# Patient Record
Sex: Female | Born: 1945
Health system: Southern US, Community
[De-identification: ages and names within clinical notes are randomized; demographics above are authoritative.]

## PROBLEM LIST (undated history)

## (undated) DIAGNOSIS — R7303 Prediabetes: Secondary | ICD-10-CM

## (undated) DIAGNOSIS — Z923 Personal history of irradiation: Secondary | ICD-10-CM

## (undated) DIAGNOSIS — I10 Essential (primary) hypertension: Secondary | ICD-10-CM

## (undated) DIAGNOSIS — C3491 Malignant neoplasm of unspecified part of right bronchus or lung: Secondary | ICD-10-CM

## (undated) DIAGNOSIS — M353 Polymyalgia rheumatica: Secondary | ICD-10-CM

## (undated) DIAGNOSIS — E785 Hyperlipidemia, unspecified: Secondary | ICD-10-CM

## (undated) HISTORY — DX: Essential (primary) hypertension: I10

## (undated) HISTORY — DX: Hyperlipidemia, unspecified: E78.5

## (undated) HISTORY — DX: Prediabetes: R73.03

## (undated) HISTORY — DX: Personal history of irradiation: Z92.3

## (undated) HISTORY — DX: Polymyalgia rheumatica: M35.3

## (undated) HISTORY — DX: Malignant neoplasm of unspecified part of right bronchus or lung: C34.91

---

## 1997-11-30 ENCOUNTER — Ambulatory Visit (HOSPITAL_COMMUNITY): Admission: RE | Admit: 1997-11-30 | Discharge: 1997-11-30 | Payer: Self-pay | Admitting: Gynecology

## 2005-03-10 ENCOUNTER — Ambulatory Visit: Payer: Self-pay | Admitting: Gastroenterology

## 2005-03-30 ENCOUNTER — Ambulatory Visit: Payer: Self-pay | Admitting: Gastroenterology

## 2006-12-10 ENCOUNTER — Encounter: Admission: RE | Admit: 2006-12-10 | Discharge: 2006-12-10 | Payer: Self-pay | Admitting: Obstetrics and Gynecology

## 2010-02-24 ENCOUNTER — Encounter: Payer: Self-pay | Admitting: Gastroenterology

## 2010-03-08 ENCOUNTER — Encounter: Payer: Self-pay | Admitting: Gastroenterology

## 2010-03-08 ENCOUNTER — Telehealth (INDEPENDENT_AMBULATORY_CARE_PROVIDER_SITE_OTHER): Payer: Self-pay | Admitting: *Deleted

## 2010-06-23 NOTE — Letter (Signed)
Summary: Colonoscopy Letter  [facility]  [address] [place], [place] [address]   Phone: [phone]  Fax: [phone]      February 24, 2010 MRN: [id number]   [facility] [address] [place], [place]  [address]   Dear Kristi Jennings,   According to your medical record, it is time for you to schedule a Colonoscopy. The American Cancer Society recommends this procedure as a method to detect early colon cancer. Patients with a family history of colon cancer, or a personal history of colon polyps or inflammatory bowel disease are at increased risk.  This letter has been generated based on the recommendations made at the time of your procedure. If you feel that in your particular situation this may no longer apply, please contact our office.  Please call our office at [phone] to schedule this appointment or to update your records at your earliest convenience.  Thank you for cooperating with [place] to provide you with the very best care possible.   Sincerely,  Judie Petit T. Russella Dar, M.D.  [facility] [phone]

## 2010-06-23 NOTE — Progress Notes (Signed)
  Phone Note Other Incoming   Request: Send information Summary of Call: Received a completed [facility] medical release form. The patient is requesting for copies of her records to be sent to [facility] Attn: Dr. Vashti Hey. Request forwarded to [employer].

## 2010-06-23 NOTE — Miscellaneous (Signed)
Summary: CHANGE GI MD  patient is changing to [facility]

## 2012-01-23 ENCOUNTER — Encounter: Payer: Self-pay | Admitting: Gastroenterology

## 2015-06-10 DIAGNOSIS — F419 Anxiety disorder, unspecified: Secondary | ICD-10-CM | POA: Insufficient documentation

## 2015-06-10 DIAGNOSIS — I10 Essential (primary) hypertension: Secondary | ICD-10-CM | POA: Insufficient documentation

## 2015-06-10 DIAGNOSIS — J309 Allergic rhinitis, unspecified: Secondary | ICD-10-CM | POA: Insufficient documentation

## 2016-04-21 DIAGNOSIS — L820 Inflamed seborrheic keratosis: Secondary | ICD-10-CM | POA: Diagnosis not present

## 2016-04-21 DIAGNOSIS — L570 Actinic keratosis: Secondary | ICD-10-CM | POA: Diagnosis not present

## 2016-05-03 DIAGNOSIS — I10 Essential (primary) hypertension: Secondary | ICD-10-CM | POA: Diagnosis not present

## 2016-05-03 DIAGNOSIS — J069 Acute upper respiratory infection, unspecified: Secondary | ICD-10-CM | POA: Diagnosis not present

## 2016-05-09 DIAGNOSIS — Z1231 Encounter for screening mammogram for malignant neoplasm of breast: Secondary | ICD-10-CM | POA: Diagnosis not present

## 2016-05-17 DIAGNOSIS — J00 Acute nasopharyngitis [common cold]: Secondary | ICD-10-CM | POA: Diagnosis not present

## 2016-05-17 DIAGNOSIS — J342 Deviated nasal septum: Secondary | ICD-10-CM | POA: Diagnosis not present

## 2016-05-17 DIAGNOSIS — J309 Allergic rhinitis, unspecified: Secondary | ICD-10-CM | POA: Diagnosis not present

## 2016-07-27 DIAGNOSIS — Z01419 Encounter for gynecological examination (general) (routine) without abnormal findings: Secondary | ICD-10-CM | POA: Diagnosis not present

## 2016-08-07 DIAGNOSIS — H43813 Vitreous degeneration, bilateral: Secondary | ICD-10-CM | POA: Diagnosis not present

## 2016-08-07 DIAGNOSIS — H5203 Hypermetropia, bilateral: Secondary | ICD-10-CM | POA: Diagnosis not present

## 2016-08-07 DIAGNOSIS — E119 Type 2 diabetes mellitus without complications: Secondary | ICD-10-CM | POA: Diagnosis not present

## 2016-08-07 DIAGNOSIS — H2513 Age-related nuclear cataract, bilateral: Secondary | ICD-10-CM | POA: Diagnosis not present

## 2016-09-19 DIAGNOSIS — L570 Actinic keratosis: Secondary | ICD-10-CM | POA: Diagnosis not present

## 2016-10-23 DIAGNOSIS — L570 Actinic keratosis: Secondary | ICD-10-CM | POA: Diagnosis not present

## 2016-10-23 DIAGNOSIS — L820 Inflamed seborrheic keratosis: Secondary | ICD-10-CM | POA: Diagnosis not present

## 2016-10-24 DIAGNOSIS — E119 Type 2 diabetes mellitus without complications: Secondary | ICD-10-CM | POA: Diagnosis not present

## 2016-10-24 DIAGNOSIS — E7800 Pure hypercholesterolemia, unspecified: Secondary | ICD-10-CM | POA: Diagnosis not present

## 2016-10-24 DIAGNOSIS — I10 Essential (primary) hypertension: Secondary | ICD-10-CM | POA: Diagnosis not present

## 2016-10-26 DIAGNOSIS — Z0000 Encounter for general adult medical examination without abnormal findings: Secondary | ICD-10-CM | POA: Diagnosis not present

## 2016-10-26 DIAGNOSIS — I10 Essential (primary) hypertension: Secondary | ICD-10-CM | POA: Diagnosis not present

## 2016-10-26 DIAGNOSIS — E7800 Pure hypercholesterolemia, unspecified: Secondary | ICD-10-CM | POA: Diagnosis not present

## 2016-10-26 DIAGNOSIS — E119 Type 2 diabetes mellitus without complications: Secondary | ICD-10-CM | POA: Diagnosis not present

## 2016-10-26 DIAGNOSIS — J309 Allergic rhinitis, unspecified: Secondary | ICD-10-CM | POA: Diagnosis not present

## 2016-10-26 DIAGNOSIS — M8580 Other specified disorders of bone density and structure, unspecified site: Secondary | ICD-10-CM | POA: Diagnosis not present

## 2016-10-26 DIAGNOSIS — R69 Illness, unspecified: Secondary | ICD-10-CM | POA: Diagnosis not present

## 2017-01-16 DIAGNOSIS — Z23 Encounter for immunization: Secondary | ICD-10-CM | POA: Diagnosis not present

## 2017-01-16 DIAGNOSIS — E785 Hyperlipidemia, unspecified: Secondary | ICD-10-CM | POA: Diagnosis not present

## 2017-01-16 DIAGNOSIS — R7303 Prediabetes: Secondary | ICD-10-CM | POA: Diagnosis not present

## 2017-01-16 DIAGNOSIS — I10 Essential (primary) hypertension: Secondary | ICD-10-CM | POA: Diagnosis not present

## 2017-02-06 DIAGNOSIS — H5203 Hypermetropia, bilateral: Secondary | ICD-10-CM | POA: Diagnosis not present

## 2017-02-06 DIAGNOSIS — H2513 Age-related nuclear cataract, bilateral: Secondary | ICD-10-CM | POA: Diagnosis not present

## 2017-03-21 DIAGNOSIS — B9689 Other specified bacterial agents as the cause of diseases classified elsewhere: Secondary | ICD-10-CM | POA: Diagnosis not present

## 2017-03-21 DIAGNOSIS — J019 Acute sinusitis, unspecified: Secondary | ICD-10-CM | POA: Diagnosis not present

## 2017-04-25 DIAGNOSIS — I10 Essential (primary) hypertension: Secondary | ICD-10-CM | POA: Diagnosis not present

## 2017-04-25 DIAGNOSIS — E559 Vitamin D deficiency, unspecified: Secondary | ICD-10-CM | POA: Diagnosis not present

## 2017-04-25 DIAGNOSIS — E785 Hyperlipidemia, unspecified: Secondary | ICD-10-CM | POA: Diagnosis not present

## 2017-04-25 DIAGNOSIS — E7800 Pure hypercholesterolemia, unspecified: Secondary | ICD-10-CM | POA: Diagnosis not present

## 2017-04-25 DIAGNOSIS — Z1159 Encounter for screening for other viral diseases: Secondary | ICD-10-CM | POA: Diagnosis not present

## 2017-04-25 DIAGNOSIS — R7303 Prediabetes: Secondary | ICD-10-CM | POA: Diagnosis not present

## 2017-06-04 DIAGNOSIS — L570 Actinic keratosis: Secondary | ICD-10-CM | POA: Diagnosis not present

## 2017-06-04 DIAGNOSIS — D225 Melanocytic nevi of trunk: Secondary | ICD-10-CM | POA: Diagnosis not present

## 2017-06-04 DIAGNOSIS — D1801 Hemangioma of skin and subcutaneous tissue: Secondary | ICD-10-CM | POA: Diagnosis not present

## 2017-06-04 DIAGNOSIS — D2261 Melanocytic nevi of right upper limb, including shoulder: Secondary | ICD-10-CM | POA: Diagnosis not present

## 2017-06-04 DIAGNOSIS — L821 Other seborrheic keratosis: Secondary | ICD-10-CM | POA: Diagnosis not present

## 2017-06-04 DIAGNOSIS — D2262 Melanocytic nevi of left upper limb, including shoulder: Secondary | ICD-10-CM | POA: Diagnosis not present

## 2017-06-05 DIAGNOSIS — Z1231 Encounter for screening mammogram for malignant neoplasm of breast: Secondary | ICD-10-CM | POA: Diagnosis not present

## 2017-06-05 DIAGNOSIS — M8589 Other specified disorders of bone density and structure, multiple sites: Secondary | ICD-10-CM | POA: Diagnosis not present

## 2017-06-05 DIAGNOSIS — Z780 Asymptomatic menopausal state: Secondary | ICD-10-CM | POA: Diagnosis not present

## 2017-06-05 DIAGNOSIS — M810 Age-related osteoporosis without current pathological fracture: Secondary | ICD-10-CM | POA: Diagnosis not present

## 2017-07-31 DIAGNOSIS — Z01419 Encounter for gynecological examination (general) (routine) without abnormal findings: Secondary | ICD-10-CM | POA: Diagnosis not present

## 2017-08-06 DIAGNOSIS — H5203 Hypermetropia, bilateral: Secondary | ICD-10-CM | POA: Diagnosis not present

## 2017-08-06 DIAGNOSIS — R7303 Prediabetes: Secondary | ICD-10-CM | POA: Diagnosis not present

## 2017-08-06 DIAGNOSIS — H02831 Dermatochalasis of right upper eyelid: Secondary | ICD-10-CM | POA: Diagnosis not present

## 2017-08-06 DIAGNOSIS — H43813 Vitreous degeneration, bilateral: Secondary | ICD-10-CM | POA: Diagnosis not present

## 2017-08-06 DIAGNOSIS — H02834 Dermatochalasis of left upper eyelid: Secondary | ICD-10-CM | POA: Diagnosis not present

## 2017-08-06 DIAGNOSIS — H2513 Age-related nuclear cataract, bilateral: Secondary | ICD-10-CM | POA: Diagnosis not present

## 2017-09-28 DIAGNOSIS — J301 Allergic rhinitis due to pollen: Secondary | ICD-10-CM | POA: Diagnosis not present

## 2017-09-28 DIAGNOSIS — J069 Acute upper respiratory infection, unspecified: Secondary | ICD-10-CM | POA: Diagnosis not present

## 2017-10-23 DIAGNOSIS — E785 Hyperlipidemia, unspecified: Secondary | ICD-10-CM | POA: Diagnosis not present

## 2017-10-23 DIAGNOSIS — I10 Essential (primary) hypertension: Secondary | ICD-10-CM | POA: Diagnosis not present

## 2017-10-25 DIAGNOSIS — E876 Hypokalemia: Secondary | ICD-10-CM | POA: Diagnosis not present

## 2017-10-29 DIAGNOSIS — E559 Vitamin D deficiency, unspecified: Secondary | ICD-10-CM | POA: Diagnosis not present

## 2017-10-29 DIAGNOSIS — J0100 Acute maxillary sinusitis, unspecified: Secondary | ICD-10-CM | POA: Diagnosis not present

## 2017-10-29 DIAGNOSIS — J209 Acute bronchitis, unspecified: Secondary | ICD-10-CM | POA: Diagnosis not present

## 2017-10-29 DIAGNOSIS — Z0000 Encounter for general adult medical examination without abnormal findings: Secondary | ICD-10-CM | POA: Diagnosis not present

## 2017-10-29 DIAGNOSIS — I10 Essential (primary) hypertension: Secondary | ICD-10-CM | POA: Diagnosis not present

## 2017-10-29 DIAGNOSIS — D72819 Decreased white blood cell count, unspecified: Secondary | ICD-10-CM | POA: Diagnosis not present

## 2017-10-29 DIAGNOSIS — R7303 Prediabetes: Secondary | ICD-10-CM | POA: Diagnosis not present

## 2017-10-29 DIAGNOSIS — E785 Hyperlipidemia, unspecified: Secondary | ICD-10-CM | POA: Diagnosis not present

## 2017-12-12 DIAGNOSIS — L570 Actinic keratosis: Secondary | ICD-10-CM | POA: Diagnosis not present

## 2017-12-12 DIAGNOSIS — D1801 Hemangioma of skin and subcutaneous tissue: Secondary | ICD-10-CM | POA: Diagnosis not present

## 2017-12-12 DIAGNOSIS — L814 Other melanin hyperpigmentation: Secondary | ICD-10-CM | POA: Diagnosis not present

## 2018-02-05 DIAGNOSIS — D72819 Decreased white blood cell count, unspecified: Secondary | ICD-10-CM | POA: Diagnosis not present

## 2018-02-05 DIAGNOSIS — Z23 Encounter for immunization: Secondary | ICD-10-CM | POA: Diagnosis not present

## 2018-02-07 DIAGNOSIS — H2513 Age-related nuclear cataract, bilateral: Secondary | ICD-10-CM | POA: Diagnosis not present

## 2018-06-19 DIAGNOSIS — D225 Melanocytic nevi of trunk: Secondary | ICD-10-CM | POA: Diagnosis not present

## 2018-06-19 DIAGNOSIS — L570 Actinic keratosis: Secondary | ICD-10-CM | POA: Diagnosis not present

## 2018-06-19 DIAGNOSIS — R7303 Prediabetes: Secondary | ICD-10-CM | POA: Diagnosis not present

## 2018-06-19 DIAGNOSIS — I10 Essential (primary) hypertension: Secondary | ICD-10-CM | POA: Diagnosis not present

## 2018-06-19 DIAGNOSIS — J0100 Acute maxillary sinusitis, unspecified: Secondary | ICD-10-CM | POA: Diagnosis not present

## 2018-06-19 DIAGNOSIS — J209 Acute bronchitis, unspecified: Secondary | ICD-10-CM | POA: Diagnosis not present

## 2018-06-19 DIAGNOSIS — D72819 Decreased white blood cell count, unspecified: Secondary | ICD-10-CM | POA: Diagnosis not present

## 2018-07-16 DIAGNOSIS — M791 Myalgia, unspecified site: Secondary | ICD-10-CM | POA: Diagnosis not present

## 2018-07-16 DIAGNOSIS — I10 Essential (primary) hypertension: Secondary | ICD-10-CM | POA: Diagnosis not present

## 2018-08-05 DIAGNOSIS — Z01419 Encounter for gynecological examination (general) (routine) without abnormal findings: Secondary | ICD-10-CM | POA: Diagnosis not present

## 2018-08-06 DIAGNOSIS — Z1231 Encounter for screening mammogram for malignant neoplasm of breast: Secondary | ICD-10-CM | POA: Diagnosis not present

## 2018-08-20 DIAGNOSIS — M2550 Pain in unspecified joint: Secondary | ICD-10-CM | POA: Diagnosis not present

## 2018-08-20 DIAGNOSIS — Z6820 Body mass index (BMI) 20.0-20.9, adult: Secondary | ICD-10-CM | POA: Diagnosis not present

## 2018-08-20 DIAGNOSIS — M150 Primary generalized (osteo)arthritis: Secondary | ICD-10-CM | POA: Diagnosis not present

## 2018-08-20 DIAGNOSIS — M353 Polymyalgia rheumatica: Secondary | ICD-10-CM | POA: Diagnosis not present

## 2018-08-20 DIAGNOSIS — R5383 Other fatigue: Secondary | ICD-10-CM | POA: Diagnosis not present

## 2018-08-20 DIAGNOSIS — R7982 Elevated C-reactive protein (CRP): Secondary | ICD-10-CM | POA: Diagnosis not present

## 2018-09-19 DIAGNOSIS — M353 Polymyalgia rheumatica: Secondary | ICD-10-CM | POA: Diagnosis not present

## 2018-09-19 DIAGNOSIS — M2550 Pain in unspecified joint: Secondary | ICD-10-CM | POA: Diagnosis not present

## 2018-09-19 DIAGNOSIS — R768 Other specified abnormal immunological findings in serum: Secondary | ICD-10-CM | POA: Diagnosis not present

## 2018-09-19 DIAGNOSIS — M154 Erosive (osteo)arthritis: Secondary | ICD-10-CM | POA: Diagnosis not present

## 2018-09-19 DIAGNOSIS — M150 Primary generalized (osteo)arthritis: Secondary | ICD-10-CM | POA: Diagnosis not present

## 2018-10-15 DIAGNOSIS — M79642 Pain in left hand: Secondary | ICD-10-CM | POA: Diagnosis not present

## 2018-10-15 DIAGNOSIS — S62321A Displaced fracture of shaft of second metacarpal bone, left hand, initial encounter for closed fracture: Secondary | ICD-10-CM | POA: Diagnosis not present

## 2018-10-29 DIAGNOSIS — R7303 Prediabetes: Secondary | ICD-10-CM | POA: Diagnosis not present

## 2018-10-29 DIAGNOSIS — I10 Essential (primary) hypertension: Secondary | ICD-10-CM | POA: Diagnosis not present

## 2018-10-31 DIAGNOSIS — D72819 Decreased white blood cell count, unspecified: Secondary | ICD-10-CM | POA: Diagnosis not present

## 2018-10-31 DIAGNOSIS — Z0000 Encounter for general adult medical examination without abnormal findings: Secondary | ICD-10-CM | POA: Diagnosis not present

## 2018-10-31 DIAGNOSIS — R3129 Other microscopic hematuria: Secondary | ICD-10-CM | POA: Diagnosis not present

## 2018-10-31 DIAGNOSIS — E876 Hypokalemia: Secondary | ICD-10-CM | POA: Diagnosis not present

## 2018-10-31 DIAGNOSIS — I493 Ventricular premature depolarization: Secondary | ICD-10-CM | POA: Diagnosis not present

## 2018-10-31 DIAGNOSIS — Z87891 Personal history of nicotine dependence: Secondary | ICD-10-CM | POA: Diagnosis not present

## 2018-10-31 DIAGNOSIS — R7303 Prediabetes: Secondary | ICD-10-CM | POA: Diagnosis not present

## 2018-10-31 DIAGNOSIS — E785 Hyperlipidemia, unspecified: Secondary | ICD-10-CM | POA: Diagnosis not present

## 2018-10-31 DIAGNOSIS — I10 Essential (primary) hypertension: Secondary | ICD-10-CM | POA: Diagnosis not present

## 2018-11-04 DIAGNOSIS — I10 Essential (primary) hypertension: Secondary | ICD-10-CM | POA: Diagnosis not present

## 2018-11-08 DIAGNOSIS — E876 Hypokalemia: Secondary | ICD-10-CM | POA: Diagnosis not present

## 2018-11-12 DIAGNOSIS — S62321D Displaced fracture of shaft of second metacarpal bone, left hand, subsequent encounter for fracture with routine healing: Secondary | ICD-10-CM | POA: Diagnosis not present

## 2018-11-12 DIAGNOSIS — M79642 Pain in left hand: Secondary | ICD-10-CM | POA: Diagnosis not present

## 2018-11-13 DIAGNOSIS — R3129 Other microscopic hematuria: Secondary | ICD-10-CM | POA: Diagnosis not present

## 2018-11-18 DIAGNOSIS — H2513 Age-related nuclear cataract, bilateral: Secondary | ICD-10-CM | POA: Diagnosis not present

## 2018-11-18 DIAGNOSIS — H524 Presbyopia: Secondary | ICD-10-CM | POA: Diagnosis not present

## 2018-11-18 DIAGNOSIS — H02834 Dermatochalasis of left upper eyelid: Secondary | ICD-10-CM | POA: Diagnosis not present

## 2018-11-18 DIAGNOSIS — H43813 Vitreous degeneration, bilateral: Secondary | ICD-10-CM | POA: Diagnosis not present

## 2018-11-18 DIAGNOSIS — H02831 Dermatochalasis of right upper eyelid: Secondary | ICD-10-CM | POA: Diagnosis not present

## 2018-11-18 DIAGNOSIS — H5203 Hypermetropia, bilateral: Secondary | ICD-10-CM | POA: Diagnosis not present

## 2018-11-18 DIAGNOSIS — E876 Hypokalemia: Secondary | ICD-10-CM | POA: Diagnosis not present

## 2018-11-18 DIAGNOSIS — H52203 Unspecified astigmatism, bilateral: Secondary | ICD-10-CM | POA: Diagnosis not present

## 2018-11-18 DIAGNOSIS — R7303 Prediabetes: Secondary | ICD-10-CM | POA: Diagnosis not present

## 2018-11-26 DIAGNOSIS — M154 Erosive (osteo)arthritis: Secondary | ICD-10-CM | POA: Diagnosis not present

## 2018-11-26 DIAGNOSIS — M150 Primary generalized (osteo)arthritis: Secondary | ICD-10-CM | POA: Diagnosis not present

## 2018-11-26 DIAGNOSIS — M2550 Pain in unspecified joint: Secondary | ICD-10-CM | POA: Diagnosis not present

## 2018-11-26 DIAGNOSIS — R768 Other specified abnormal immunological findings in serum: Secondary | ICD-10-CM | POA: Diagnosis not present

## 2018-11-26 DIAGNOSIS — M353 Polymyalgia rheumatica: Secondary | ICD-10-CM | POA: Diagnosis not present

## 2018-12-12 DIAGNOSIS — S62321D Displaced fracture of shaft of second metacarpal bone, left hand, subsequent encounter for fracture with routine healing: Secondary | ICD-10-CM | POA: Diagnosis not present

## 2018-12-12 DIAGNOSIS — M79642 Pain in left hand: Secondary | ICD-10-CM | POA: Diagnosis not present

## 2018-12-18 DIAGNOSIS — B351 Tinea unguium: Secondary | ICD-10-CM | POA: Diagnosis not present

## 2018-12-18 DIAGNOSIS — L821 Other seborrheic keratosis: Secondary | ICD-10-CM | POA: Diagnosis not present

## 2018-12-18 DIAGNOSIS — L570 Actinic keratosis: Secondary | ICD-10-CM | POA: Diagnosis not present

## 2018-12-26 DIAGNOSIS — J342 Deviated nasal septum: Secondary | ICD-10-CM | POA: Diagnosis not present

## 2018-12-26 DIAGNOSIS — J328 Other chronic sinusitis: Secondary | ICD-10-CM | POA: Diagnosis not present

## 2018-12-26 DIAGNOSIS — J309 Allergic rhinitis, unspecified: Secondary | ICD-10-CM | POA: Diagnosis not present

## 2018-12-26 DIAGNOSIS — H608X3 Other otitis externa, bilateral: Secondary | ICD-10-CM | POA: Diagnosis not present

## 2019-01-29 DIAGNOSIS — R69 Illness, unspecified: Secondary | ICD-10-CM | POA: Diagnosis not present

## 2019-01-29 DIAGNOSIS — M150 Primary generalized (osteo)arthritis: Secondary | ICD-10-CM | POA: Diagnosis not present

## 2019-01-29 DIAGNOSIS — Z79899 Other long term (current) drug therapy: Secondary | ICD-10-CM | POA: Diagnosis not present

## 2019-01-29 DIAGNOSIS — M154 Erosive (osteo)arthritis: Secondary | ICD-10-CM | POA: Diagnosis not present

## 2019-01-29 DIAGNOSIS — R5383 Other fatigue: Secondary | ICD-10-CM | POA: Diagnosis not present

## 2019-01-29 DIAGNOSIS — M2550 Pain in unspecified joint: Secondary | ICD-10-CM | POA: Diagnosis not present

## 2019-01-29 DIAGNOSIS — M353 Polymyalgia rheumatica: Secondary | ICD-10-CM | POA: Diagnosis not present

## 2019-01-29 DIAGNOSIS — R768 Other specified abnormal immunological findings in serum: Secondary | ICD-10-CM | POA: Diagnosis not present

## 2019-02-12 DIAGNOSIS — M79642 Pain in left hand: Secondary | ICD-10-CM | POA: Diagnosis not present

## 2019-02-12 DIAGNOSIS — S62321D Displaced fracture of shaft of second metacarpal bone, left hand, subsequent encounter for fracture with routine healing: Secondary | ICD-10-CM | POA: Diagnosis not present

## 2019-03-31 DIAGNOSIS — B078 Other viral warts: Secondary | ICD-10-CM | POA: Diagnosis not present

## 2019-04-02 DIAGNOSIS — M150 Primary generalized (osteo)arthritis: Secondary | ICD-10-CM | POA: Diagnosis not present

## 2019-04-02 DIAGNOSIS — M154 Erosive (osteo)arthritis: Secondary | ICD-10-CM | POA: Diagnosis not present

## 2019-04-02 DIAGNOSIS — M2550 Pain in unspecified joint: Secondary | ICD-10-CM | POA: Diagnosis not present

## 2019-04-02 DIAGNOSIS — L659 Nonscarring hair loss, unspecified: Secondary | ICD-10-CM | POA: Diagnosis not present

## 2019-04-02 DIAGNOSIS — R768 Other specified abnormal immunological findings in serum: Secondary | ICD-10-CM | POA: Diagnosis not present

## 2019-04-02 DIAGNOSIS — M353 Polymyalgia rheumatica: Secondary | ICD-10-CM | POA: Diagnosis not present

## 2019-04-02 DIAGNOSIS — Z79899 Other long term (current) drug therapy: Secondary | ICD-10-CM | POA: Diagnosis not present

## 2019-05-26 DIAGNOSIS — H5203 Hypermetropia, bilateral: Secondary | ICD-10-CM | POA: Diagnosis not present

## 2019-05-26 DIAGNOSIS — H524 Presbyopia: Secondary | ICD-10-CM | POA: Diagnosis not present

## 2019-05-26 DIAGNOSIS — H52203 Unspecified astigmatism, bilateral: Secondary | ICD-10-CM | POA: Diagnosis not present

## 2019-05-26 DIAGNOSIS — H2513 Age-related nuclear cataract, bilateral: Secondary | ICD-10-CM | POA: Diagnosis not present

## 2019-06-25 DIAGNOSIS — L570 Actinic keratosis: Secondary | ICD-10-CM | POA: Diagnosis not present

## 2019-06-25 DIAGNOSIS — L821 Other seborrheic keratosis: Secondary | ICD-10-CM | POA: Diagnosis not present

## 2019-06-25 DIAGNOSIS — L814 Other melanin hyperpigmentation: Secondary | ICD-10-CM | POA: Diagnosis not present

## 2019-06-25 DIAGNOSIS — L578 Other skin changes due to chronic exposure to nonionizing radiation: Secondary | ICD-10-CM | POA: Diagnosis not present

## 2019-07-08 DIAGNOSIS — Z6821 Body mass index (BMI) 21.0-21.9, adult: Secondary | ICD-10-CM | POA: Diagnosis not present

## 2019-07-08 DIAGNOSIS — R768 Other specified abnormal immunological findings in serum: Secondary | ICD-10-CM | POA: Diagnosis not present

## 2019-07-08 DIAGNOSIS — M353 Polymyalgia rheumatica: Secondary | ICD-10-CM | POA: Diagnosis not present

## 2019-07-08 DIAGNOSIS — M150 Primary generalized (osteo)arthritis: Secondary | ICD-10-CM | POA: Diagnosis not present

## 2019-07-08 DIAGNOSIS — Z79899 Other long term (current) drug therapy: Secondary | ICD-10-CM | POA: Diagnosis not present

## 2019-07-08 DIAGNOSIS — M2550 Pain in unspecified joint: Secondary | ICD-10-CM | POA: Diagnosis not present

## 2019-07-08 DIAGNOSIS — M154 Erosive (osteo)arthritis: Secondary | ICD-10-CM | POA: Diagnosis not present

## 2019-08-12 DIAGNOSIS — M353 Polymyalgia rheumatica: Secondary | ICD-10-CM | POA: Insufficient documentation

## 2019-08-12 DIAGNOSIS — Z01419 Encounter for gynecological examination (general) (routine) without abnormal findings: Secondary | ICD-10-CM | POA: Diagnosis not present

## 2019-08-28 DIAGNOSIS — M8589 Other specified disorders of bone density and structure, multiple sites: Secondary | ICD-10-CM | POA: Diagnosis not present

## 2019-08-28 DIAGNOSIS — Z1231 Encounter for screening mammogram for malignant neoplasm of breast: Secondary | ICD-10-CM | POA: Diagnosis not present

## 2019-08-28 DIAGNOSIS — Z780 Asymptomatic menopausal state: Secondary | ICD-10-CM | POA: Diagnosis not present

## 2019-08-28 DIAGNOSIS — M810 Age-related osteoporosis without current pathological fracture: Secondary | ICD-10-CM | POA: Diagnosis not present

## 2019-09-24 DIAGNOSIS — R7303 Prediabetes: Secondary | ICD-10-CM | POA: Diagnosis not present

## 2019-09-24 DIAGNOSIS — I10 Essential (primary) hypertension: Secondary | ICD-10-CM | POA: Diagnosis not present

## 2019-09-24 DIAGNOSIS — E785 Hyperlipidemia, unspecified: Secondary | ICD-10-CM | POA: Diagnosis not present

## 2019-09-25 DIAGNOSIS — D72819 Decreased white blood cell count, unspecified: Secondary | ICD-10-CM | POA: Diagnosis not present

## 2019-09-25 DIAGNOSIS — R7303 Prediabetes: Secondary | ICD-10-CM | POA: Diagnosis not present

## 2019-09-25 DIAGNOSIS — E876 Hypokalemia: Secondary | ICD-10-CM | POA: Diagnosis not present

## 2019-09-25 DIAGNOSIS — E785 Hyperlipidemia, unspecified: Secondary | ICD-10-CM | POA: Diagnosis not present

## 2019-09-25 DIAGNOSIS — I10 Essential (primary) hypertension: Secondary | ICD-10-CM | POA: Diagnosis not present

## 2019-09-25 DIAGNOSIS — M353 Polymyalgia rheumatica: Secondary | ICD-10-CM | POA: Diagnosis not present

## 2019-10-06 DIAGNOSIS — M2550 Pain in unspecified joint: Secondary | ICD-10-CM | POA: Diagnosis not present

## 2019-10-06 DIAGNOSIS — M154 Erosive (osteo)arthritis: Secondary | ICD-10-CM | POA: Diagnosis not present

## 2019-10-06 DIAGNOSIS — I10 Essential (primary) hypertension: Secondary | ICD-10-CM | POA: Diagnosis not present

## 2019-10-06 DIAGNOSIS — R768 Other specified abnormal immunological findings in serum: Secondary | ICD-10-CM | POA: Diagnosis not present

## 2019-10-06 DIAGNOSIS — M150 Primary generalized (osteo)arthritis: Secondary | ICD-10-CM | POA: Diagnosis not present

## 2019-10-06 DIAGNOSIS — M353 Polymyalgia rheumatica: Secondary | ICD-10-CM | POA: Diagnosis not present

## 2019-10-06 DIAGNOSIS — Z6820 Body mass index (BMI) 20.0-20.9, adult: Secondary | ICD-10-CM | POA: Diagnosis not present

## 2019-10-10 DIAGNOSIS — I10 Essential (primary) hypertension: Secondary | ICD-10-CM | POA: Diagnosis not present

## 2019-12-04 DIAGNOSIS — H52203 Unspecified astigmatism, bilateral: Secondary | ICD-10-CM | POA: Diagnosis not present

## 2019-12-04 DIAGNOSIS — H524 Presbyopia: Secondary | ICD-10-CM | POA: Diagnosis not present

## 2019-12-04 DIAGNOSIS — H2513 Age-related nuclear cataract, bilateral: Secondary | ICD-10-CM | POA: Diagnosis not present

## 2019-12-04 DIAGNOSIS — H02831 Dermatochalasis of right upper eyelid: Secondary | ICD-10-CM | POA: Diagnosis not present

## 2019-12-04 DIAGNOSIS — R7303 Prediabetes: Secondary | ICD-10-CM | POA: Diagnosis not present

## 2019-12-04 DIAGNOSIS — H02834 Dermatochalasis of left upper eyelid: Secondary | ICD-10-CM | POA: Diagnosis not present

## 2019-12-04 DIAGNOSIS — H43813 Vitreous degeneration, bilateral: Secondary | ICD-10-CM | POA: Diagnosis not present

## 2019-12-04 DIAGNOSIS — H5203 Hypermetropia, bilateral: Secondary | ICD-10-CM | POA: Diagnosis not present

## 2019-12-31 DIAGNOSIS — L578 Other skin changes due to chronic exposure to nonionizing radiation: Secondary | ICD-10-CM | POA: Diagnosis not present

## 2019-12-31 DIAGNOSIS — L814 Other melanin hyperpigmentation: Secondary | ICD-10-CM | POA: Diagnosis not present

## 2019-12-31 DIAGNOSIS — L821 Other seborrheic keratosis: Secondary | ICD-10-CM | POA: Diagnosis not present

## 2019-12-31 DIAGNOSIS — L570 Actinic keratosis: Secondary | ICD-10-CM | POA: Diagnosis not present

## 2020-01-13 DIAGNOSIS — R768 Other specified abnormal immunological findings in serum: Secondary | ICD-10-CM | POA: Diagnosis not present

## 2020-01-13 DIAGNOSIS — M2550 Pain in unspecified joint: Secondary | ICD-10-CM | POA: Diagnosis not present

## 2020-01-13 DIAGNOSIS — M353 Polymyalgia rheumatica: Secondary | ICD-10-CM | POA: Diagnosis not present

## 2020-01-13 DIAGNOSIS — M150 Primary generalized (osteo)arthritis: Secondary | ICD-10-CM | POA: Diagnosis not present

## 2020-01-13 DIAGNOSIS — Z681 Body mass index (BMI) 19 or less, adult: Secondary | ICD-10-CM | POA: Diagnosis not present

## 2020-01-13 DIAGNOSIS — M154 Erosive (osteo)arthritis: Secondary | ICD-10-CM | POA: Diagnosis not present

## 2020-03-16 DIAGNOSIS — H0012 Chalazion right lower eyelid: Secondary | ICD-10-CM | POA: Diagnosis not present

## 2020-03-16 DIAGNOSIS — H0100A Unspecified blepharitis right eye, upper and lower eyelids: Secondary | ICD-10-CM | POA: Diagnosis not present

## 2020-03-16 DIAGNOSIS — H0100B Unspecified blepharitis left eye, upper and lower eyelids: Secondary | ICD-10-CM | POA: Diagnosis not present

## 2020-04-29 ENCOUNTER — Emergency Department
Admission: RE | Admit: 2020-04-29 | Discharge: 2020-04-29 | Disposition: A | Discharge status: Home / Self Care | Payer: Medicare HMO | Source: Ambulatory Visit

## 2020-04-29 ENCOUNTER — Other Ambulatory Visit: Payer: Self-pay

## 2020-04-29 VITALS — BP 134/78 | HR 80 | Temp 98.3°F | Resp 17 | Ht 64.0 in | Wt 119.0 lb

## 2020-04-29 DIAGNOSIS — R059 Cough, unspecified: Secondary | ICD-10-CM

## 2020-04-29 DIAGNOSIS — R0981 Nasal congestion: Secondary | ICD-10-CM

## 2020-04-29 DIAGNOSIS — J3489 Other specified disorders of nose and nasal sinuses: Secondary | ICD-10-CM

## 2020-04-29 MED ORDER — BENZONATATE 100 MG PO CAPS: 100.0000 mg | ORAL_CAPSULE | Freq: Three times a day (TID) | ORAL | 0 refills | Status: DC

## 2020-04-29 MED ORDER — IPRATROPIUM BROMIDE 0.06 % NA SOLN: 2.0000 | Freq: Four times a day (QID) | NASAL | 1 refills | Status: DC

## 2020-04-29 NOTE — ED Triage Notes (Addendum)
Nasal congestion w/ cough & sinus drainage x 5 days  OTC Corcidin, tylenol Just finished a course of doxycycline from opthamologist Denies fever COVID booster this past month

## 2020-04-29 NOTE — Discharge Instructions (Addendum)
°  You may use the medications as prescribed to help with cough and sinus drainage.  Follow up with family medicine later next week if not improving, especially if symptoms worsening- pain, fever, vomiting.

## 2020-04-29 NOTE — ED Provider Notes (Signed)
[facility]    CSN: [id number] Arrival date & time: 04/29/20  0945      History   Chief Complaint Chief Complaint  Patient presents with  . Sore Throat    HPI Kristi Jennings is a 74 y.o. female.   HPI Kristi Jennings is a 74 y.o. female presenting to UC with c/o nasal congestion, cough, and sinus drainage that is clear for about 5 days.  She has taken OTC Coricidin and tylenol with mild relief.  She just finished a 30 day course of doxycycline for a chalazion tx by her ophthalmologist.  Denies fever, chills, n/v/d. Her husband was seen last week for a URI, both have been fully vaccinated including booster COVID vaccine in October. Denies n/v/d.      History reviewed. No pertinent past medical history.  Patient Active Problem List   Diagnosis Date Noted  . Polymyalgia rheumatica ([place]) 08/12/2019  . Allergic rhinitis 06/10/2015  . Anxiety 06/10/2015  . Benign essential hypertension 06/10/2015    History reviewed. No pertinent surgical history.  OB History   No obstetric history on file.      Home Medications    Prior to Admission medications   Medication Sig Start Date End Date Taking? Authorizing Provider  losartan (COZAAR) 50 MG tablet Take 1 tablet by mouth daily. 01/16/14  Yes [provider]  predniSONE (DELTASONE) 1 MG tablet  11/26/18  Yes [provider]  triamterene-hydrochlorothiazide (DYAZIDE) 37.5-25 MG capsule Take 1 tablet by mouth daily. 01/16/14  Yes [provider]  aspirin EC 81 MG tablet Take 81 mg by mouth 4 (four) times a week. Swallow whole.    [provider]  benzonatate (TESSALON) 100 MG capsule Take 1-2 capsules (100-200 mg total) by mouth every 8 (eight) hours. 04/29/20   Noe Gens, PA-C  ipratropium (ATROVENT) 0.06 % nasal spray Place 2 sprays into both nostrils 4 (four) times daily. 04/29/20   Noe Gens, PA-C  rosuvastatin (CRESTOR) 10 MG tablet Take 10 mg by mouth at bedtime. 02/23/20    [provider]    Family History Family History  Problem Relation Age of Onset  . Heart attack Mother   . Colon cancer Father   . ALS Brother     Social History Social History   Tobacco Use  . Smoking status: Former Research scientist (life sciences)  . Smokeless tobacco: Never Used  . Tobacco comment: quit 2010  Vaping Use  . Vaping Use: Never used  Substance Use Topics  . Alcohol use: Not Currently  . Drug use: Never     Allergies   Other   Review of Systems Review of Systems  Constitutional: Negative for chills and fever.  HENT: Positive for congestion and rhinorrhea. Negative for ear pain, sore throat, trouble swallowing and voice change.   Respiratory: Positive for cough. Negative for shortness of breath.   Cardiovascular: Negative for chest pain and palpitations.  Gastrointestinal: Negative for abdominal pain, diarrhea, nausea and vomiting.  Musculoskeletal: Negative for arthralgias, back pain and myalgias.  Skin: Negative for rash.  Neurological: Positive for headaches (mild). Negative for dizziness and light-headedness.  All other systems reviewed and are negative.    Physical Exam Triage Vital Signs ED Triage Vitals  Enc Vitals Group     BP 04/29/20 1002 134/78     Pulse Rate 04/29/20 1002 80     Resp 04/29/20 1002 17     Temp 04/29/20 1002 98.3 F (36.8 C)  Temp Source 04/29/20 1002 Oral     SpO2 04/29/20 1002 96 %     Weight 04/29/20 1006 119 lb (54 kg)     Height 04/29/20 1006 5\' 4"  (1.626 m)     Head Circumference --      Peak Flow --      Pain Score 04/29/20 1005 0     Pain Loc --      Pain Edu? --      Excl. in [place]? --    No data found.  Updated Vital Signs BP 134/78 (BP Location: Right Arm)   Pulse 80   Temp 98.3 F (36.8 C) (Oral)   Resp 17   Ht 5\' 4"  (1.626 m)   Wt 119 lb (54 kg)   SpO2 96%   BMI 20.43 kg/m   Visual Acuity Right Eye Distance:   Left Eye Distance:   Bilateral Distance:    Right Eye Near:   Left Eye Near:     Bilateral Near:     Physical Exam Vitals and nursing note reviewed.  Constitutional:      General: She is not in acute distress.    Appearance: She is well-developed and well-nourished. She is not ill-appearing, toxic-appearing or diaphoretic.  HENT:     Head: Normocephalic and atraumatic.     Right Ear: Tympanic membrane and ear canal normal.     Left Ear: Tympanic membrane and ear canal normal.     Nose: Nose normal.     Right Sinus: No maxillary sinus tenderness or frontal sinus tenderness.     Left Sinus: No maxillary sinus tenderness or frontal sinus tenderness.     Mouth/Throat:     Lips: Pink.     Mouth: Mucous membranes are moist.     Pharynx: Oropharynx is clear. Uvula midline. No pharyngeal swelling, oropharyngeal exudate, posterior oropharyngeal erythema or uvula swelling.  Eyes:     Extraocular Movements: EOM normal.  Cardiovascular:     Rate and Rhythm: Normal rate and regular rhythm.  Pulmonary:     Effort: Pulmonary effort is normal. No respiratory distress.     Breath sounds: Normal breath sounds. No stridor. No wheezing, rhonchi or rales.  Musculoskeletal:        General: Normal range of motion.     Cervical back: Normal range of motion and neck supple.  Lymphadenopathy:     Cervical: No cervical adenopathy.  Skin:    General: Skin is warm and dry.  Neurological:     Mental Status: She is alert and oriented to person, place, and time.  Psychiatric:        Mood and Affect: Mood and affect normal.        Behavior: Behavior normal.      UC Treatments / Results  Labs (all labs ordered are listed, but only abnormal results are displayed) Labs Reviewed - No data to display  EKG   Radiology No results found.  Procedures Procedures (including critical care time)  Medications Ordered in UC Medications - No data to display  Initial Impression / Assessment and Plan / UC Course  I have reviewed the triage vital signs and the nursing  notes.  Pertinent labs & imaging results that were available during my care of the patient were reviewed by me and considered in my medical decision making (see chart for details).     No evidence of bacterial infection at this time Encouraged symptomatic tx F/u with PCP next week if needed  Final Clinical Impressions(s) / UC Diagnoses   Final diagnoses:  Nasal congestion  Rhinorrhea  Cough     Discharge Instructions      You may use the medications as prescribed to help with cough and sinus drainage.  Follow up with family medicine later next week if not improving, especially if symptoms worsening- pain, fever, vomiting.     ED Prescriptions    Medication Sig Dispense Auth. Provider   ipratropium (ATROVENT) 0.06 % nasal spray Place 2 sprays into both nostrils 4 (four) times daily. 15 mL Haden Cavenaugh O, PA-C   benzonatate (TESSALON) 100 MG capsule Take 1-2 capsules (100-200 mg total) by mouth every 8 (eight) hours. 21 capsule Noe Gens, [place]     PDMP not reviewed this encounter.   Noe Gens, PA-C 04/29/20 1139

## 2020-05-13 DIAGNOSIS — J019 Acute sinusitis, unspecified: Secondary | ICD-10-CM | POA: Diagnosis not present

## 2020-05-13 DIAGNOSIS — R7303 Prediabetes: Secondary | ICD-10-CM | POA: Diagnosis not present

## 2020-05-13 DIAGNOSIS — J209 Acute bronchitis, unspecified: Secondary | ICD-10-CM | POA: Diagnosis not present

## 2020-05-18 DIAGNOSIS — R768 Other specified abnormal immunological findings in serum: Secondary | ICD-10-CM | POA: Diagnosis not present

## 2020-05-18 DIAGNOSIS — Z681 Body mass index (BMI) 19 or less, adult: Secondary | ICD-10-CM | POA: Diagnosis not present

## 2020-05-18 DIAGNOSIS — M150 Primary generalized (osteo)arthritis: Secondary | ICD-10-CM | POA: Diagnosis not present

## 2020-05-18 DIAGNOSIS — M353 Polymyalgia rheumatica: Secondary | ICD-10-CM | POA: Diagnosis not present

## 2020-05-18 DIAGNOSIS — M2550 Pain in unspecified joint: Secondary | ICD-10-CM | POA: Diagnosis not present

## 2020-05-18 DIAGNOSIS — M154 Erosive (osteo)arthritis: Secondary | ICD-10-CM | POA: Diagnosis not present

## 2020-05-25 DIAGNOSIS — I10 Essential (primary) hypertension: Secondary | ICD-10-CM | POA: Diagnosis not present

## 2020-05-27 DIAGNOSIS — M353 Polymyalgia rheumatica: Secondary | ICD-10-CM | POA: Diagnosis not present

## 2020-05-27 DIAGNOSIS — I10 Essential (primary) hypertension: Secondary | ICD-10-CM | POA: Diagnosis not present

## 2020-06-15 DIAGNOSIS — H524 Presbyopia: Secondary | ICD-10-CM | POA: Diagnosis not present

## 2020-06-15 DIAGNOSIS — H52203 Unspecified astigmatism, bilateral: Secondary | ICD-10-CM | POA: Diagnosis not present

## 2020-06-15 DIAGNOSIS — H5203 Hypermetropia, bilateral: Secondary | ICD-10-CM | POA: Diagnosis not present

## 2020-06-15 DIAGNOSIS — H2513 Age-related nuclear cataract, bilateral: Secondary | ICD-10-CM | POA: Diagnosis not present

## 2020-07-01 DIAGNOSIS — Z1211 Encounter for screening for malignant neoplasm of colon: Secondary | ICD-10-CM | POA: Diagnosis not present

## 2020-07-01 DIAGNOSIS — K639 Disease of intestine, unspecified: Secondary | ICD-10-CM | POA: Diagnosis not present

## 2020-07-01 DIAGNOSIS — K6389 Other specified diseases of intestine: Secondary | ICD-10-CM | POA: Diagnosis not present

## 2020-07-01 DIAGNOSIS — K5730 Diverticulosis of large intestine without perforation or abscess without bleeding: Secondary | ICD-10-CM | POA: Diagnosis not present

## 2020-07-01 DIAGNOSIS — Z8719 Personal history of other diseases of the digestive system: Secondary | ICD-10-CM | POA: Diagnosis not present

## 2020-07-01 DIAGNOSIS — Z86010 Personal history of colonic polyps: Secondary | ICD-10-CM | POA: Diagnosis not present

## 2020-07-01 DIAGNOSIS — Z800 Family history of malignant neoplasm of digestive organs: Secondary | ICD-10-CM | POA: Diagnosis not present

## 2020-07-01 DIAGNOSIS — K640 First degree hemorrhoids: Secondary | ICD-10-CM | POA: Diagnosis not present

## 2020-07-01 DIAGNOSIS — K635 Polyp of colon: Secondary | ICD-10-CM | POA: Diagnosis not present

## 2020-07-01 DIAGNOSIS — E65 Localized adiposity: Secondary | ICD-10-CM | POA: Diagnosis not present

## 2020-07-01 DIAGNOSIS — D122 Benign neoplasm of ascending colon: Secondary | ICD-10-CM | POA: Diagnosis not present

## 2020-07-07 DIAGNOSIS — L578 Other skin changes due to chronic exposure to nonionizing radiation: Secondary | ICD-10-CM | POA: Diagnosis not present

## 2020-07-07 DIAGNOSIS — L570 Actinic keratosis: Secondary | ICD-10-CM | POA: Diagnosis not present

## 2020-07-07 DIAGNOSIS — L814 Other melanin hyperpigmentation: Secondary | ICD-10-CM | POA: Diagnosis not present

## 2020-07-07 DIAGNOSIS — X32XXXS Exposure to sunlight, sequela: Secondary | ICD-10-CM | POA: Diagnosis not present

## 2020-07-07 DIAGNOSIS — L2089 Other atopic dermatitis: Secondary | ICD-10-CM | POA: Diagnosis not present

## 2020-07-22 DIAGNOSIS — I10 Essential (primary) hypertension: Secondary | ICD-10-CM | POA: Diagnosis not present

## 2020-07-22 DIAGNOSIS — N390 Urinary tract infection, site not specified: Secondary | ICD-10-CM | POA: Diagnosis not present

## 2020-07-29 DIAGNOSIS — N183 Chronic kidney disease, stage 3 unspecified: Secondary | ICD-10-CM | POA: Diagnosis not present

## 2020-07-29 DIAGNOSIS — I10 Essential (primary) hypertension: Secondary | ICD-10-CM | POA: Diagnosis not present

## 2020-07-29 DIAGNOSIS — E559 Vitamin D deficiency, unspecified: Secondary | ICD-10-CM | POA: Diagnosis not present

## 2020-08-17 DIAGNOSIS — M353 Polymyalgia rheumatica: Secondary | ICD-10-CM | POA: Diagnosis not present

## 2020-08-17 DIAGNOSIS — R768 Other specified abnormal immunological findings in serum: Secondary | ICD-10-CM | POA: Diagnosis not present

## 2020-08-17 DIAGNOSIS — M150 Primary generalized (osteo)arthritis: Secondary | ICD-10-CM | POA: Diagnosis not present

## 2020-08-17 DIAGNOSIS — M154 Erosive (osteo)arthritis: Secondary | ICD-10-CM | POA: Diagnosis not present

## 2020-08-17 DIAGNOSIS — Z681 Body mass index (BMI) 19 or less, adult: Secondary | ICD-10-CM | POA: Diagnosis not present

## 2020-08-17 DIAGNOSIS — R69 Illness, unspecified: Secondary | ICD-10-CM | POA: Diagnosis not present

## 2020-08-17 DIAGNOSIS — M2550 Pain in unspecified joint: Secondary | ICD-10-CM | POA: Diagnosis not present

## 2020-08-18 DIAGNOSIS — Z86010 Personal history of colonic polyps: Secondary | ICD-10-CM | POA: Diagnosis not present

## 2020-08-18 DIAGNOSIS — Z01419 Encounter for gynecological examination (general) (routine) without abnormal findings: Secondary | ICD-10-CM | POA: Diagnosis not present

## 2020-08-18 DIAGNOSIS — R69 Illness, unspecified: Secondary | ICD-10-CM | POA: Diagnosis not present

## 2020-08-18 DIAGNOSIS — F411 Generalized anxiety disorder: Secondary | ICD-10-CM | POA: Diagnosis not present

## 2020-08-19 DIAGNOSIS — I10 Essential (primary) hypertension: Secondary | ICD-10-CM | POA: Diagnosis not present

## 2020-08-31 DIAGNOSIS — Z1231 Encounter for screening mammogram for malignant neoplasm of breast: Secondary | ICD-10-CM | POA: Diagnosis not present

## 2020-09-13 DIAGNOSIS — I10 Essential (primary) hypertension: Secondary | ICD-10-CM | POA: Diagnosis not present

## 2020-09-13 DIAGNOSIS — R7303 Prediabetes: Secondary | ICD-10-CM | POA: Diagnosis not present

## 2020-09-13 DIAGNOSIS — E785 Hyperlipidemia, unspecified: Secondary | ICD-10-CM | POA: Diagnosis not present

## 2020-09-16 DIAGNOSIS — R69 Illness, unspecified: Secondary | ICD-10-CM | POA: Diagnosis not present

## 2020-09-16 DIAGNOSIS — Z79899 Other long term (current) drug therapy: Secondary | ICD-10-CM | POA: Diagnosis not present

## 2020-09-16 LAB — HM COLONOSCOPY

## 2020-09-29 ENCOUNTER — Emergency Department
Admission: RE | Admit: 2020-09-29 | Discharge: 2020-09-29 | Disposition: A | Discharge status: Home / Self Care | Payer: Medicare HMO | Source: Ambulatory Visit

## 2020-09-29 ENCOUNTER — Other Ambulatory Visit: Payer: Self-pay

## 2020-09-29 VITALS — BP 112/66 | HR 77 | Temp 99.2°F | Resp 17

## 2020-09-29 DIAGNOSIS — J22 Unspecified acute lower respiratory infection: Secondary | ICD-10-CM | POA: Diagnosis not present

## 2020-09-29 MED ORDER — BENZONATATE 200 MG PO CAPS: 200.0000 mg | ORAL_CAPSULE | Freq: Three times a day (TID) | ORAL | 0 refills | Status: DC

## 2020-09-29 MED ORDER — AMOXICILLIN-POT CLAVULANATE 875-125 MG PO TABS: 1.0000 | ORAL_TABLET | Freq: Two times a day (BID) | ORAL | 0 refills | Status: DC

## 2020-09-29 NOTE — ED Provider Notes (Signed)
[facility]    CSN: [id number] Arrival date & time: 09/29/20  9323      History   Chief Complaint Chief Complaint  Patient presents with  . Cough  . Nasal Congestion    HPI Kristi Jennings is a 75 y.o. female.   HPI   Healthy 75 year old.  Here with her husband for a "chest cold".  Is been present for over a week.  She has postnasal drip, sinus congestion and pressure, and coughing.  Feels like she has congestion in her upper chest from the sinus drainage.  She states she feels very weak and tired.  No fever.  No headache.  She is COVID vaccinated.  History reviewed. No pertinent past medical history.  Patient Active Problem List   Diagnosis Date Noted  . Polymyalgia rheumatica ([place]) 08/12/2019  . Allergic rhinitis 06/10/2015  . Anxiety 06/10/2015  . Benign essential hypertension 06/10/2015    History reviewed. No pertinent surgical history.  OB History   No obstetric history on file.      Home Medications    Prior to Admission medications   Medication Sig Start Date End Date Taking? Authorizing Provider  amoxicillin-clavulanate (AUGMENTIN) 875-125 MG tablet Take 1 tablet by mouth every 12 (twelve) hours. 09/29/20  Yes Raylene Everts, MD  atenolol (TENORMIN) 25 MG tablet  07/22/20  Yes [provider]  escitalopram (LEXAPRO) 10 MG tablet Take 1 tablet by mouth at bedtime. 09/16/20  Yes [provider]  aspirin EC 81 MG tablet Take 81 mg by mouth 4 (four) times a week. Swallow whole.    [provider]  benzonatate (TESSALON) 200 MG capsule Take 1 capsule (200 mg total) by mouth every 8 (eight) hours. 09/29/20   Raylene Everts, MD  ipratropium (ATROVENT) 0.06 % nasal spray Place 2 sprays into both nostrils 4 (four) times daily. 04/29/20   Noe Gens, PA-C  losartan (COZAAR) 50 MG tablet Take 1 tablet by mouth daily. 01/16/14   [provider]  predniSONE (DELTASONE) 1 MG tablet  11/26/18   [provider]   rosuvastatin (CRESTOR) 10 MG tablet Take 10 mg by mouth at bedtime. 02/23/20   [provider]  triamterene-hydrochlorothiazide (DYAZIDE) 37.5-25 MG capsule Take 1 tablet by mouth daily. 01/16/14   [provider]    Family History Family History  Problem Relation Age of Onset  . Heart attack Mother   . Colon cancer Father   . ALS Brother     Social History Social History   Tobacco Use  . Smoking status: Former Research scientist (life sciences)  . Smokeless tobacco: Never Used  . Tobacco comment: quit 2010  Vaping Use  . Vaping Use: Never used  Substance Use Topics  . Alcohol use: Not Currently  . Drug use: Never     Allergies   Other   Review of Systems Review of Systems See HPI  Physical Exam Triage Vital Signs ED Triage Vitals  Enc Vitals Group     BP 09/29/20 1010 112/66     Pulse Rate 09/29/20 1010 77     Resp 09/29/20 1010 17     Temp 09/29/20 1010 99.2 F (37.3 C)     Temp Source 09/29/20 1010 Oral     SpO2 09/29/20 1010 96 %     Weight --      Height --      Head Circumference --      Peak Flow --  Pain Score 09/29/20 1006 0     Pain Loc --      Pain Edu? --      Excl. in [place]? --    No data found.  Updated Vital Signs BP 112/66 (BP Location: Right Arm)   Pulse 77   Temp 99.2 F (37.3 C) (Oral)   Resp 17   SpO2 96%     Physical Exam Constitutional:      General: She is not in acute distress.    Appearance: She is well-developed and normal weight.     Comments: Small stature.  No acute distress  HENT:     Head: Normocephalic and atraumatic.     Right Ear: Tympanic membrane and ear canal normal.     Left Ear: Tympanic membrane and ear canal normal.     Nose: Congestion present.     Mouth/Throat:     Mouth: Mucous membranes are moist.     Pharynx: Posterior oropharyngeal erythema present.  Eyes:     Conjunctiva/sclera: Conjunctivae normal.     Pupils: Pupils are equal, round, and reactive to light.  Cardiovascular:     Rate and Rhythm:  Normal rate and regular rhythm.     Heart sounds: Normal heart sounds.  Pulmonary:     Effort: Pulmonary effort is normal. No respiratory distress.     Breath sounds: Normal breath sounds.  Abdominal:     General: There is no distension.     Palpations: Abdomen is soft.  Musculoskeletal:        General: Normal range of motion.     Cervical back: Normal range of motion and neck supple.  Lymphadenopathy:     Cervical: No cervical adenopathy.  Skin:    General: Skin is warm and dry.  Neurological:     Mental Status: She is alert.  Psychiatric:        Behavior: Behavior normal.      UC Treatments / Results  Labs (all labs ordered are listed, but only abnormal results are displayed) Labs Reviewed - No data to display  EKG   Radiology No results found.  Procedures Procedures (including critical care time)  Medications Ordered in UC Medications - No data to display  Initial Impression / Assessment and Plan / UC Course  I have reviewed the triage vital signs and the nursing notes.  Pertinent labs & imaging results that were available during my care of the patient were reviewed by me and considered in my medical decision making (see chart for details).     Patient has had her viral symptoms for over a week.  Feels like she is getting worse.  States she is still very tired.  Will cover with an antibiotic.  Symptomatic care discussed.  Drink lots of fluids.  Return as needed Final Clinical Impressions(s) / UC Diagnoses   Final diagnoses:  LRTI (lower respiratory tract infection)     Discharge Instructions     Drink plenty of water Use a humidifier if you have 1 Take the antibiotic 2 times a day Consider taking a probiotic while on the antibiotic Take Tessalon twice a day for cough May take other over-the-counter medicines as needed   ED Prescriptions    Medication Sig Dispense Auth. Provider   benzonatate (TESSALON) 200 MG capsule Take 1 capsule (200 mg total)  by mouth every 8 (eight) hours. 20 capsule Raylene Everts, MD   amoxicillin-clavulanate (AUGMENTIN) 875-125 MG tablet Take 1 tablet by mouth every 12 (  twelve) hours. 14 tablet Raylene Everts, MD     PDMP not reviewed this encounter.   Raylene Everts, MD 09/29/20 1113

## 2020-09-29 NOTE — ED Triage Notes (Signed)
Pt c/o cough and congestion x 1 week. Taking tylenol and Coricidin prn. Denies fever.

## 2020-09-29 NOTE — Discharge Instructions (Addendum)
Drink plenty of water Use a humidifier if you have 1 Take the antibiotic 2 times a day Consider taking a probiotic while on the antibiotic Take Tessalon twice a day for cough May take other over-the-counter medicines as needed

## 2020-10-12 DIAGNOSIS — M47896 Other spondylosis, lumbar region: Secondary | ICD-10-CM | POA: Diagnosis not present

## 2020-10-12 DIAGNOSIS — M47816 Spondylosis without myelopathy or radiculopathy, lumbar region: Secondary | ICD-10-CM | POA: Diagnosis not present

## 2020-10-12 DIAGNOSIS — M545 Low back pain, unspecified: Secondary | ICD-10-CM | POA: Diagnosis not present

## 2020-10-20 ENCOUNTER — Other Ambulatory Visit: Payer: Self-pay

## 2020-10-20 ENCOUNTER — Emergency Department (INDEPENDENT_AMBULATORY_CARE_PROVIDER_SITE_OTHER)
Admission: RE | Admit: 2020-10-20 | Discharge: 2020-10-20 | Disposition: A | Discharge status: Other Acute Inpatient Hospital | Payer: Medicare HMO | Source: Ambulatory Visit

## 2020-10-20 VITALS — BP 82/50 | HR 72 | Temp 98.9°F

## 2020-10-20 DIAGNOSIS — Z87891 Personal history of nicotine dependence: Secondary | ICD-10-CM | POA: Diagnosis not present

## 2020-10-20 DIAGNOSIS — Z20822 Contact with and (suspected) exposure to covid-19: Secondary | ICD-10-CM | POA: Diagnosis not present

## 2020-10-20 DIAGNOSIS — R590 Localized enlarged lymph nodes: Secondary | ICD-10-CM | POA: Diagnosis not present

## 2020-10-20 DIAGNOSIS — R531 Weakness: Secondary | ICD-10-CM

## 2020-10-20 DIAGNOSIS — D72829 Elevated white blood cell count, unspecified: Secondary | ICD-10-CM | POA: Diagnosis not present

## 2020-10-20 DIAGNOSIS — M353 Polymyalgia rheumatica: Secondary | ICD-10-CM | POA: Diagnosis not present

## 2020-10-20 DIAGNOSIS — E871 Hypo-osmolality and hyponatremia: Secondary | ICD-10-CM | POA: Diagnosis not present

## 2020-10-20 DIAGNOSIS — J851 Abscess of lung with pneumonia: Secondary | ICD-10-CM | POA: Diagnosis not present

## 2020-10-20 DIAGNOSIS — A419 Sepsis, unspecified organism: Secondary | ICD-10-CM | POA: Diagnosis not present

## 2020-10-20 DIAGNOSIS — R630 Anorexia: Secondary | ICD-10-CM | POA: Diagnosis not present

## 2020-10-20 DIAGNOSIS — I10 Essential (primary) hypertension: Secondary | ICD-10-CM | POA: Diagnosis not present

## 2020-10-20 DIAGNOSIS — J852 Abscess of lung without pneumonia: Secondary | ICD-10-CM | POA: Diagnosis not present

## 2020-10-20 DIAGNOSIS — J984 Other disorders of lung: Secondary | ICD-10-CM | POA: Diagnosis not present

## 2020-10-20 DIAGNOSIS — J853 Abscess of mediastinum: Secondary | ICD-10-CM | POA: Diagnosis not present

## 2020-10-20 DIAGNOSIS — R5383 Other fatigue: Secondary | ICD-10-CM

## 2020-10-20 DIAGNOSIS — I959 Hypotension, unspecified: Secondary | ICD-10-CM

## 2020-10-20 DIAGNOSIS — E278 Other specified disorders of adrenal gland: Secondary | ICD-10-CM | POA: Diagnosis not present

## 2020-10-20 DIAGNOSIS — N3289 Other specified disorders of bladder: Secondary | ICD-10-CM | POA: Diagnosis not present

## 2020-10-20 DIAGNOSIS — R634 Abnormal weight loss: Secondary | ICD-10-CM | POA: Diagnosis not present

## 2020-10-20 DIAGNOSIS — E785 Hyperlipidemia, unspecified: Secondary | ICD-10-CM | POA: Diagnosis not present

## 2020-10-20 DIAGNOSIS — R918 Other nonspecific abnormal finding of lung field: Secondary | ICD-10-CM | POA: Diagnosis not present

## 2020-10-20 NOTE — ED Provider Notes (Signed)
[facility]    CSN: [id number] Arrival date & time: 10/20/20  0900      History   Chief Complaint Chief Complaint  Patient presents with  . Appointment  . Emesis    HPI Kristi Jennings is a 75 y.o. female.   HPI 75 year old female presents with vomiting this morning, fatigue decreased appetite for 3 weeks.  Patient reports was seen here 3 weeks ago but has not gotten any better.  Very weak, decreased energy, and reporting most cold symptoms has been resolved.  Patient is vaccinated for COVID-19.  She was evaluated here on 09/29/2020 and was prescribed Augmentin, patient reports discontinuing this medication after 1 dose as it made her stomach upset.  Reports taking this medication without food.  History reviewed. No pertinent past medical history.  Patient Active Problem List   Diagnosis Date Noted  . Polymyalgia rheumatica ([place]) 08/12/2019  . Allergic rhinitis 06/10/2015  . Anxiety 06/10/2015  . Benign essential hypertension 06/10/2015    History reviewed. No pertinent surgical history.  OB History   No obstetric history on file.      Home Medications    Prior to Admission medications   Medication Sig Start Date End Date Taking? Authorizing Provider  aspirin EC 81 MG tablet Take 81 mg by mouth 4 (four) times a week. Swallow whole.   Yes [provider]  atenolol (TENORMIN) 25 MG tablet  07/22/20  Yes [provider]  escitalopram (LEXAPRO) 10 MG tablet Take 1 tablet by mouth at bedtime. 09/16/20  Yes [provider]  losartan (COZAAR) 50 MG tablet Take 1 tablet by mouth daily. 01/16/14  Yes [provider]  rosuvastatin (CRESTOR) 10 MG tablet Take 10 mg by mouth at bedtime. 02/23/20  Yes [provider]  amoxicillin-clavulanate (AUGMENTIN) 875-125 MG tablet Take 1 tablet by mouth every 12 (twelve) hours. 09/29/20   Raylene Everts, MD  benzonatate (TESSALON) 200 MG capsule Take 1 capsule (200 mg total) by mouth  every 8 (eight) hours. 09/29/20   Raylene Everts, MD  ipratropium (ATROVENT) 0.06 % nasal spray Place 2 sprays into both nostrils 4 (four) times daily. 04/29/20   Noe Gens, PA-C  predniSONE (DELTASONE) 1 MG tablet  11/26/18   [provider]  triamterene-hydrochlorothiazide (DYAZIDE) 37.5-25 MG capsule Take 1 tablet by mouth daily. 01/16/14   [provider]    Family History Family History  Problem Relation Age of Onset  . Heart attack Mother   . Colon cancer Father   . ALS Brother     Social History Social History   Tobacco Use  . Smoking status: Former Research scientist (life sciences)  . Smokeless tobacco: Never Used  . Tobacco comment: quit 2010  Vaping Use  . Vaping Use: Never used  Substance Use Topics  . Alcohol use: Not Currently  . Drug use: Never     Allergies   Other and Augmentin [amoxicillin-pot clavulanate]   Review of Systems Review of Systems  Constitutional: Positive for appetite change, chills and fatigue.  HENT: Negative.   Eyes: Negative.   Respiratory: Negative.   Cardiovascular: Negative.   Gastrointestinal: Negative.   Genitourinary: Negative.   Musculoskeletal: Negative.   Skin: Negative.   Neurological: Positive for weakness.     Physical Exam Triage Vital Signs ED Triage Vitals  Enc Vitals Group     BP      Pulse      Resp      Temp  Temp src      SpO2      Weight      Height      Head Circumference      Peak Flow      Pain Score      Pain Loc      Pain Edu?      Excl. in [place]?    No data found.  Updated Vital Signs BP (!) 82/50 (BP Location: Left Arm)   Pulse 72   Temp 98.9 F (37.2 C) (Oral)   SpO2 100%    Physical Exam Constitutional:      General: She is not in acute distress.    Appearance: Normal appearance. She is ill-appearing.  HENT:     Head: Normocephalic and atraumatic.     Right Ear: Tympanic membrane and ear canal normal.     Left Ear: Tympanic membrane and ear canal normal.     Nose: Nose  normal.     Mouth/Throat:     Mouth: Mucous membranes are moist.     Pharynx: Oropharynx is clear.  Eyes:     Extraocular Movements: Extraocular movements intact.     Conjunctiva/sclera: Conjunctivae normal.     Pupils: Pupils are equal, round, and reactive to light.  Cardiovascular:     Rate and Rhythm: Normal rate and regular rhythm.     Pulses: Normal pulses.     Heart sounds: Normal heart sounds.     Comments: Hypotensive Pulmonary:     Effort: Pulmonary effort is normal.     Breath sounds: Normal breath sounds.     Comments: Distant breath sounds noted throughout, no adventitious breath sounds noted Abdominal:     General: There is no distension.     Palpations: Abdomen is soft. There is no mass.     Tenderness: There is no abdominal tenderness. There is no right CVA tenderness, left CVA tenderness, guarding or rebound.     Comments: Hypoactive bowel sounds noted throughout, no hepatosplenomegaly  Musculoskeletal:        General: Normal range of motion.     Cervical back: Normal range of motion and neck supple. No tenderness.  Lymphadenopathy:     Cervical: Cervical adenopathy present.  Skin:    General: Skin is warm and dry.  Neurological:     General: No focal deficit present.     Mental Status: She is alert and oriented to person, place, and time.     Cranial Nerves: No cranial nerve deficit.     Sensory: No sensory deficit.     Coordination: Coordination normal.     Gait: Gait normal.     Deep Tendon Reflexes: Reflexes normal.  Psychiatric:        Mood and Affect: Mood normal.        Behavior: Behavior normal.      UC Treatments / Results  Labs (all labs ordered are listed, but only abnormal results are displayed) Labs Reviewed - No data to display  EKG   Radiology No results found.  Procedures Procedures (including critical care time)  Medications Ordered in UC Medications - No data to display  Initial Impression / Assessment and Plan / UC Course   I have reviewed the triage vital signs and the nursing notes.  Pertinent labs & imaging results that were available during my care of the patient were reviewed by me and considered in my medical decision making (see chart for details).     MDM: 1.  Hypotension, 2.  Weakness, 3.  Fatigue, 4.  Decreased appetite.  Patient discharged hemodynamically stable, instructed patient/neighbor to go to [facility] now for immediate evaluation. Final Clinical Impressions(s) / UC Diagnoses   Final diagnoses:  Hypotension, unspecified hypotension type  Weakness  Fatigue, unspecified type  Decreased appetite     Discharge Instructions     Instructed patient/neighbor go to [facility] now for immediate evaluation.    ED Prescriptions    None     PDMP not reviewed this encounter.   Eliezer Lofts, [place] 10/20/20 0945

## 2020-10-20 NOTE — Discharge Instructions (Addendum)
Instructed patient/neighbor go to [facility] now for immediate evaluation.

## 2020-10-20 NOTE — ED Triage Notes (Signed)
Patient c/o vomiting this morning, fatigue, decreased appetite x 3 weeks.  Patient states that she was seen here several weeks ago and hasn't really gotten better since then.  Very weak, decreased energy, cold sx's have resolved.  Patient is vaccinated for COVID.

## 2020-10-21 DIAGNOSIS — M353 Polymyalgia rheumatica: Secondary | ICD-10-CM | POA: Diagnosis not present

## 2020-10-21 DIAGNOSIS — I10 Essential (primary) hypertension: Secondary | ICD-10-CM | POA: Diagnosis not present

## 2020-10-21 DIAGNOSIS — R630 Anorexia: Secondary | ICD-10-CM | POA: Diagnosis not present

## 2020-10-21 DIAGNOSIS — R634 Abnormal weight loss: Secondary | ICD-10-CM | POA: Diagnosis not present

## 2020-10-21 DIAGNOSIS — D72829 Elevated white blood cell count, unspecified: Secondary | ICD-10-CM | POA: Diagnosis not present

## 2020-10-21 DIAGNOSIS — E785 Hyperlipidemia, unspecified: Secondary | ICD-10-CM | POA: Diagnosis not present

## 2020-10-21 DIAGNOSIS — R531 Weakness: Secondary | ICD-10-CM | POA: Diagnosis not present

## 2020-10-21 DIAGNOSIS — J853 Abscess of mediastinum: Secondary | ICD-10-CM | POA: Diagnosis not present

## 2020-10-21 DIAGNOSIS — J852 Abscess of lung without pneumonia: Secondary | ICD-10-CM | POA: Diagnosis not present

## 2020-10-21 DIAGNOSIS — Z87891 Personal history of nicotine dependence: Secondary | ICD-10-CM | POA: Diagnosis not present

## 2020-10-21 DIAGNOSIS — E871 Hypo-osmolality and hyponatremia: Secondary | ICD-10-CM | POA: Diagnosis not present

## 2020-10-22 DIAGNOSIS — R630 Anorexia: Secondary | ICD-10-CM | POA: Diagnosis not present

## 2020-10-22 DIAGNOSIS — E871 Hypo-osmolality and hyponatremia: Secondary | ICD-10-CM | POA: Diagnosis not present

## 2020-10-22 DIAGNOSIS — Z87891 Personal history of nicotine dependence: Secondary | ICD-10-CM | POA: Diagnosis not present

## 2020-10-22 DIAGNOSIS — E785 Hyperlipidemia, unspecified: Secondary | ICD-10-CM | POA: Diagnosis not present

## 2020-10-22 DIAGNOSIS — J852 Abscess of lung without pneumonia: Secondary | ICD-10-CM | POA: Diagnosis not present

## 2020-10-22 DIAGNOSIS — M353 Polymyalgia rheumatica: Secondary | ICD-10-CM | POA: Diagnosis not present

## 2020-10-22 DIAGNOSIS — R634 Abnormal weight loss: Secondary | ICD-10-CM | POA: Diagnosis not present

## 2020-10-22 DIAGNOSIS — I10 Essential (primary) hypertension: Secondary | ICD-10-CM | POA: Diagnosis not present

## 2020-10-22 DIAGNOSIS — J853 Abscess of mediastinum: Secondary | ICD-10-CM | POA: Diagnosis not present

## 2020-10-22 DIAGNOSIS — D72829 Elevated white blood cell count, unspecified: Secondary | ICD-10-CM | POA: Diagnosis not present

## 2020-10-23 DIAGNOSIS — M353 Polymyalgia rheumatica: Secondary | ICD-10-CM | POA: Diagnosis not present

## 2020-10-23 DIAGNOSIS — Z87891 Personal history of nicotine dependence: Secondary | ICD-10-CM | POA: Diagnosis not present

## 2020-10-23 DIAGNOSIS — J853 Abscess of mediastinum: Secondary | ICD-10-CM | POA: Diagnosis not present

## 2020-10-23 DIAGNOSIS — E871 Hypo-osmolality and hyponatremia: Secondary | ICD-10-CM | POA: Diagnosis not present

## 2020-10-23 DIAGNOSIS — E785 Hyperlipidemia, unspecified: Secondary | ICD-10-CM | POA: Diagnosis not present

## 2020-10-23 DIAGNOSIS — I10 Essential (primary) hypertension: Secondary | ICD-10-CM | POA: Diagnosis not present

## 2020-10-23 DIAGNOSIS — R634 Abnormal weight loss: Secondary | ICD-10-CM | POA: Diagnosis not present

## 2020-10-23 DIAGNOSIS — R630 Anorexia: Secondary | ICD-10-CM | POA: Diagnosis not present

## 2020-10-23 DIAGNOSIS — D72829 Elevated white blood cell count, unspecified: Secondary | ICD-10-CM | POA: Diagnosis not present

## 2020-10-23 DIAGNOSIS — J852 Abscess of lung without pneumonia: Secondary | ICD-10-CM | POA: Diagnosis not present

## 2020-10-26 DIAGNOSIS — R918 Other nonspecific abnormal finding of lung field: Secondary | ICD-10-CM | POA: Diagnosis not present

## 2020-10-26 DIAGNOSIS — R0602 Shortness of breath: Secondary | ICD-10-CM | POA: Diagnosis not present

## 2020-10-27 DIAGNOSIS — Z09 Encounter for follow-up examination after completed treatment for conditions other than malignant neoplasm: Secondary | ICD-10-CM | POA: Diagnosis not present

## 2020-10-27 DIAGNOSIS — I10 Essential (primary) hypertension: Secondary | ICD-10-CM | POA: Diagnosis not present

## 2020-10-27 DIAGNOSIS — J189 Pneumonia, unspecified organism: Secondary | ICD-10-CM | POA: Diagnosis not present

## 2020-10-27 DIAGNOSIS — R918 Other nonspecific abnormal finding of lung field: Secondary | ICD-10-CM | POA: Diagnosis not present

## 2020-11-09 DIAGNOSIS — R918 Other nonspecific abnormal finding of lung field: Secondary | ICD-10-CM | POA: Diagnosis not present

## 2020-11-09 DIAGNOSIS — E279 Disorder of adrenal gland, unspecified: Secondary | ICD-10-CM | POA: Diagnosis not present

## 2020-11-09 DIAGNOSIS — K5790 Diverticulosis of intestine, part unspecified, without perforation or abscess without bleeding: Secondary | ICD-10-CM | POA: Diagnosis not present

## 2020-11-09 DIAGNOSIS — R590 Localized enlarged lymph nodes: Secondary | ICD-10-CM | POA: Diagnosis not present

## 2020-11-09 DIAGNOSIS — M898X8 Other specified disorders of bone, other site: Secondary | ICD-10-CM | POA: Diagnosis not present

## 2020-11-09 DIAGNOSIS — M439 Deforming dorsopathy, unspecified: Secondary | ICD-10-CM | POA: Diagnosis not present

## 2020-11-15 DIAGNOSIS — C3490 Malignant neoplasm of unspecified part of unspecified bronchus or lung: Secondary | ICD-10-CM | POA: Diagnosis not present

## 2020-11-15 DIAGNOSIS — R9089 Other abnormal findings on diagnostic imaging of central nervous system: Secondary | ICD-10-CM | POA: Diagnosis not present

## 2020-11-17 DIAGNOSIS — Z79899 Other long term (current) drug therapy: Secondary | ICD-10-CM | POA: Diagnosis not present

## 2020-11-17 DIAGNOSIS — Z91013 Allergy to seafood: Secondary | ICD-10-CM | POA: Diagnosis not present

## 2020-11-17 DIAGNOSIS — R918 Other nonspecific abnormal finding of lung field: Secondary | ICD-10-CM | POA: Diagnosis not present

## 2020-11-17 DIAGNOSIS — Z888 Allergy status to other drugs, medicaments and biological substances status: Secondary | ICD-10-CM | POA: Diagnosis not present

## 2020-11-17 DIAGNOSIS — C771 Secondary and unspecified malignant neoplasm of intrathoracic lymph nodes: Secondary | ICD-10-CM | POA: Diagnosis not present

## 2020-11-17 DIAGNOSIS — Z7982 Long term (current) use of aspirin: Secondary | ICD-10-CM | POA: Diagnosis not present

## 2020-11-17 DIAGNOSIS — C3490 Malignant neoplasm of unspecified part of unspecified bronchus or lung: Secondary | ICD-10-CM | POA: Diagnosis not present

## 2020-11-17 DIAGNOSIS — I10 Essential (primary) hypertension: Secondary | ICD-10-CM | POA: Diagnosis not present

## 2020-11-17 DIAGNOSIS — R590 Localized enlarged lymph nodes: Secondary | ICD-10-CM | POA: Diagnosis not present

## 2020-11-17 DIAGNOSIS — Z881 Allergy status to other antibiotic agents status: Secondary | ICD-10-CM | POA: Diagnosis not present

## 2020-11-17 DIAGNOSIS — Z87891 Personal history of nicotine dependence: Secondary | ICD-10-CM | POA: Diagnosis not present

## 2020-11-17 DIAGNOSIS — E785 Hyperlipidemia, unspecified: Secondary | ICD-10-CM | POA: Diagnosis not present

## 2020-11-25 ENCOUNTER — Ambulatory Visit
Admission: RE | Admit: 2020-11-25 | Discharge: 2020-11-25 | Disposition: A | Discharge status: Home / Self Care | Payer: Self-pay | Source: Ambulatory Visit | Attending: Internal Medicine | Admitting: Internal Medicine

## 2020-11-25 ENCOUNTER — Telehealth: Payer: Self-pay | Admitting: Internal Medicine

## 2020-11-25 ENCOUNTER — Encounter: Payer: Self-pay | Admitting: *Deleted

## 2020-11-25 DIAGNOSIS — R918 Other nonspecific abnormal finding of lung field: Secondary | ICD-10-CM

## 2020-11-25 NOTE — Telephone Encounter (Signed)
Received a new pt referral from Dr. Tilden Dome for lung mass. Kristi Jennings has been scheduled to see Kristi Jennings on 7/12 at 2:15pm w/labs at 1:45pm. Appt date and time has been given to the pt's friend. Aware to have her arrive 15 minutes early.

## 2020-11-25 NOTE — Progress Notes (Signed)
I received referral on Ms. Starace today.  I updated new patient coordinator to call and schedule her to be seen on 7/12 with Dr. Julien Nordmann. Her information is in care everywhere.  I called pathology dept at [facility] to have them follow up on molecular test results.  Wait for a call back from them with an update.

## 2020-11-25 NOTE — Progress Notes (Signed)
Faxed request to [facility] for scans to be pushed to [facility] system.

## 2020-11-25 NOTE — Progress Notes (Signed)
I called [facility] to get images pushed into PACS.  They need a faxed request and will complete this today.

## 2020-11-30 ENCOUNTER — Encounter: Payer: Self-pay | Admitting: Internal Medicine

## 2020-11-30 ENCOUNTER — Inpatient Hospital Stay (HOSPITAL_BASED_OUTPATIENT_CLINIC_OR_DEPARTMENT_OTHER): Payer: Medicare HMO | Admitting: Internal Medicine

## 2020-11-30 ENCOUNTER — Other Ambulatory Visit: Payer: Self-pay

## 2020-11-30 ENCOUNTER — Inpatient Hospital Stay: Payer: Medicare HMO | Attending: Internal Medicine

## 2020-11-30 ENCOUNTER — Inpatient Hospital Stay: Payer: Medicare HMO

## 2020-11-30 VITALS — BP 104/52 | HR 62 | Temp 98.7°F | Resp 16 | Ht 65.0 in | Wt 102.3 lb

## 2020-11-30 DIAGNOSIS — G893 Neoplasm related pain (acute) (chronic): Secondary | ICD-10-CM | POA: Insufficient documentation

## 2020-11-30 DIAGNOSIS — R918 Other nonspecific abnormal finding of lung field: Secondary | ICD-10-CM

## 2020-11-30 DIAGNOSIS — C7951 Secondary malignant neoplasm of bone: Secondary | ICD-10-CM | POA: Insufficient documentation

## 2020-11-30 DIAGNOSIS — Z87891 Personal history of nicotine dependence: Secondary | ICD-10-CM

## 2020-11-30 DIAGNOSIS — F419 Anxiety disorder, unspecified: Secondary | ICD-10-CM

## 2020-11-30 DIAGNOSIS — Z7952 Long term (current) use of systemic steroids: Secondary | ICD-10-CM

## 2020-11-30 DIAGNOSIS — C3411 Malignant neoplasm of upper lobe, right bronchus or lung: Secondary | ICD-10-CM

## 2020-11-30 DIAGNOSIS — C7971 Secondary malignant neoplasm of right adrenal gland: Secondary | ICD-10-CM

## 2020-11-30 DIAGNOSIS — Z5111 Encounter for antineoplastic chemotherapy: Secondary | ICD-10-CM

## 2020-11-30 DIAGNOSIS — Z79899 Other long term (current) drug therapy: Secondary | ICD-10-CM

## 2020-11-30 DIAGNOSIS — I10 Essential (primary) hypertension: Secondary | ICD-10-CM | POA: Insufficient documentation

## 2020-11-30 DIAGNOSIS — Z5112 Encounter for antineoplastic immunotherapy: Secondary | ICD-10-CM

## 2020-11-30 DIAGNOSIS — E785 Hyperlipidemia, unspecified: Secondary | ICD-10-CM

## 2020-11-30 DIAGNOSIS — C3491 Malignant neoplasm of unspecified part of right bronchus or lung: Secondary | ICD-10-CM

## 2020-11-30 DIAGNOSIS — E46 Unspecified protein-calorie malnutrition: Secondary | ICD-10-CM

## 2020-11-30 LAB — CBC WITH DIFFERENTIAL (CANCER CENTER ONLY)
Abs Immature Granulocytes: 0.02 10*3/uL (ref 0.00–0.07)
Basophils Absolute: 0.1 10*3/uL (ref 0.0–0.1)
Basophils Relative: 1 %
Eosinophils Absolute: 0.4 10*3/uL (ref 0.0–0.5)
Eosinophils Relative: 4 %
HCT: 37.1 % (ref 36.0–46.0)
Hemoglobin: 12.1 g/dL (ref 12.0–15.0)
Immature Granulocytes: 0 %
Lymphocytes Relative: 17 %
Lymphs Abs: 1.7 10*3/uL (ref 0.7–4.0)
MCH: 30 pg (ref 26.0–34.0)
MCHC: 32.6 g/dL (ref 30.0–36.0)
MCV: 91.8 fL (ref 80.0–100.0)
Monocytes Absolute: 1.2 10*3/uL — ABNORMAL HIGH (ref 0.1–1.0)
Monocytes Relative: 12 %
Neutro Abs: 7 10*3/uL (ref 1.7–7.7)
Neutrophils Relative %: 66 %
Platelet Count: 307 10*3/uL (ref 150–400)
RBC: 4.04 MIL/uL (ref 3.87–5.11)
RDW: 13.9 % (ref 11.5–15.5)
WBC Count: 10.4 10*3/uL (ref 4.0–10.5)
nRBC: 0 % (ref 0.0–0.2)

## 2020-11-30 LAB — CMP (CANCER CENTER ONLY)
ALT: 10 U/L (ref 0–44)
AST: 15 U/L (ref 15–41)
Albumin: 3.6 g/dL (ref 3.5–5.0)
Alkaline Phosphatase: 74 U/L (ref 38–126)
Anion gap: 11 (ref 5–15)
BUN: 20 mg/dL (ref 8–23)
CO2: 27 mmol/L (ref 22–32)
Calcium: 9.5 mg/dL (ref 8.9–10.3)
Chloride: 98 mmol/L (ref 98–111)
Creatinine: 0.67 mg/dL (ref 0.44–1.00)
GFR, Estimated: >60 mL/min
Glucose, Bld: 105 mg/dL — ABNORMAL HIGH (ref 70–99)
Potassium: 4.3 mmol/L (ref 3.5–5.1)
Sodium: 136 mmol/L (ref 135–145)
Total Bilirubin: 0.5 mg/dL (ref 0.3–1.2)
Total Protein: 7.2 g/dL (ref 6.5–8.1)

## 2020-11-30 MED ORDER — PROCHLORPERAZINE MALEATE 10 MG PO TABS: 10.0000 mg | ORAL_TABLET | Freq: Four times a day (QID) | ORAL | 0 refills | Status: DC | PRN

## 2020-11-30 MED ORDER — MORPHINE SULFATE ER 30 MG PO TBCR: 30.0000 mg | EXTENDED_RELEASE_TABLET | Freq: Two times a day (BID) | ORAL | 0 refills | Status: DC

## 2020-11-30 MED ORDER — METHYLPREDNISOLONE 4 MG PO TBPK: ORAL_TABLET | ORAL | 0 refills | Status: DC

## 2020-11-30 NOTE — Progress Notes (Signed)
[place] [phone]   [phone]  CONSULT NOTE  REFERRING PHYSICIAN: Dr. Lourdes Sledge  REASON FOR CONSULTATION:  75 years old female recently diagnosed with lung cancer.  HPI Kristi Jennings is a 75 y.o. female with past medical history significant for hypertension, polymyalgia rheumatica followed by Dr. Trudie Reed, dyslipidemia, borderline diabetes mellitus as well as history of smoking but quit in 2010.  The patient mentioned that 2 months ago she had cough and cold symptoms but with no fever.  Her husband had similar symptoms.  They were seen at the urgent care center and given prescription for Augmentin but she could not tolerate it.  She also mentions that she was losing weight over the previous few months.  Her symptoms did not improve and she presented to the emergency department at [facility] and on October 20, 2020 she had CT scan of the chest, abdomen pelvis performed that showed right upper lobe irregular gas and fluid collection measuring 6.5 x 3.3 x 3.8 cm with invasion of the right upper mediastinum and there was also suspicious right adrenal gland metastasis measuring 3.7 x 1.9 cm.  The patient was referred to Dr. Kennith Gain a pulmonologist with [facility] and a PET scan was performed on 11/09/2020 and it showed hypermetabolic necrotic right upper lobe mass consistent with primary malignancy.  There was multistation hypermetabolic necrotic mediastinal adenopathy and hypermetabolic osseous disease in the right third rib and T8.  There was also moderately hypermetabolic G38 compression deformity that may be pathologic but is indeterminate.  There was also hypermetabolic right adrenal mass consistent with metastasis.  MRI of the brain performed on November 15, 2020 showed punctate focus of enhancement in the right occipital lobe this could represent a vascular structure versus small metastasis.  No other lesions identified. On November 17, 2020 the patient  underwent bronchoscopy with EBUS under the care of Dr. Raenette Rover. The final pathology ([id number]) showed the fine-needle aspiration of the right paratracheal lymph node was consistent with non-small cell carcinoma but no further subtyping of the tumor was made. The patient was referred to me today for evaluation and recommendation regarding her condition. When seen today she continues to have the nagging pain in the back of the right shoulder as well as around the rib cage and lower back.  She is currently on hydrocodone 5/325 mg p.o. every 6-8 hours as needed.  The patient also has lack of appetite and she lost around 15 pounds in the last 6 months.  She denied having any current chest pain or shortness of breath but she has cough occasionally and no hemoptysis.  She has no nausea, vomiting, abdominal pain, diarrhea but has constipation and currently using MiraLAX.  The patient has no headache or visual changes. Family history significant for mother with heart disease and died at age 81.  Father had colon cancer and died at age 57 and brother had ALS. The patient is married and has no children.  She was accompanied today by her husband Kristi Jennings.  He used to work as a Social worker in [employer].  She has a history of smoking more than 1 pack/day for around 43 years and quit in 2010.  She also used to drink alcohol 2-3 times a week but not in the last 2 months.  She has no history of drug abuse.  HPI  Past Medical History:  Diagnosis Date   Borderline diabetes mellitus    Bronchogenic cancer of right  lung (HCC)    Dyslipidemia    Hypertension    Polymyalgia rheumatica ([place])     No past surgical history on file.  Family History  Problem Relation Age of Onset   Heart attack Mother    Colon cancer Father    ALS Brother     Social History Social History   Tobacco Use   Smoking status: Former    Packs/day: 1.00    Years: 43.00    Pack years: 43.00    Types: Cigarettes    Quit date:  11/23/2008    Years since quitting: 12.0   Smokeless tobacco: Never   Tobacco comments:    quit 2010  Vaping Use   Vaping Use: Never used  Substance Use Topics   Alcohol use: Not Currently   Drug use: Never    Allergies  Allergen Reactions   Other Itching, Rash and Hives   Augmentin [Amoxicillin-Pot Clavulanate] Nausea And Vomiting    Current Outpatient Medications  Medication Sig Dispense Refill   amoxicillin-clavulanate (AUGMENTIN) 875-125 MG tablet Take 1 tablet by mouth every 12 (twelve) hours. 14 tablet 0   aspirin EC 81 MG tablet Take 81 mg by mouth 4 (four) times a week. Swallow whole.     atenolol (TENORMIN) 25 MG tablet      benzonatate (TESSALON) 200 MG capsule Take 1 capsule (200 mg total) by mouth every 8 (eight) hours. 20 capsule 0   escitalopram (LEXAPRO) 10 MG tablet Take 1 tablet by mouth at bedtime.     ipratropium (ATROVENT) 0.06 % nasal spray Place 2 sprays into both nostrils 4 (four) times daily. 15 mL 1   losartan (COZAAR) 50 MG tablet Take 1 tablet by mouth daily.     predniSONE (DELTASONE) 1 MG tablet      rosuvastatin (CRESTOR) 10 MG tablet Take 10 mg by mouth at bedtime.     triamterene-hydrochlorothiazide (DYAZIDE) 37.5-25 MG capsule Take 1 tablet by mouth daily.     No current facility-administered medications for this visit.    Review of Systems  Constitutional: positive for anorexia, fatigue, and weight loss Eyes: negative Ears, nose, mouth, throat, and face: negative Respiratory: positive for cough Cardiovascular: negative Gastrointestinal: positive for constipation Genitourinary:negative Integument/breast: negative Hematologic/lymphatic: negative Musculoskeletal:positive for back pain, bone pain, and myalgias Neurological: negative Behavioral/Psych: negative Endocrine: negative Allergic/Immunologic: negative  Physical Exam  EPP:IRJJO, healthy, no distress, well nourished, well developed, and anxious SKIN: skin color, texture, turgor  are normal, no rashes or significant lesions HEAD: Normocephalic, No masses, lesions, tenderness or abnormalities EYES: normal, PERRLA, Conjunctiva are pink and non-injected EARS: External ears normal, Canals clear OROPHARYNX:no exudate, no erythema, and lips, buccal mucosa, and tongue normal  NECK: supple, no adenopathy, no JVD LYMPH:  no palpable lymphadenopathy, no hepatosplenomegaly BREAST:not examined LUNGS: clear to auscultation , and palpation HEART: regular rate & rhythm, no murmurs, and no gallops ABDOMEN:abdomen soft, non-tender, normal bowel sounds, and no masses or organomegaly BACK: No CVA tenderness, Range of motion is normal EXTREMITIES:no joint deformities, effusion, or inflammation, no edema  NEURO: alert & oriented x 3 with fluent speech, no focal motor/sensory deficits  PERFORMANCE STATUS: ECOG 1  LABORATORY DATA: Lab Results  Component Value Date   WBC 10.4 11/30/2020   HGB 12.1 11/30/2020   HCT 37.1 11/30/2020   MCV 91.8 11/30/2020   PLT 307 11/30/2020      Chemistry   No results found for: NA, K, CL, CO2, BUN, CREATININE, GLU No results found for:  CALCIUM, ALKPHOS, AST, ALT, BILITOT     RADIOGRAPHIC STUDIES: No results found.  ASSESSMENT: This is a very pleasant anxious 75 years old white female recently diagnosed with stage IV (T3, N2, M1 C) non-small cell lung cancer with unknown histologic subtype but likely to be squamous cell carcinoma based on the morphology of the right upper lobe cavitary mass.  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   PLAN: I had a lengthy discussion with the patient and her husband today about her current disease stage, prognosis and treatment options. I personally and independently reviewed her imaging studies. I explained to the patient that she has incurable  condition and all the treatment options will be of palliative nature. I recommended for the patient to send a blood sample to [facility] for molecular studies and to identify any actionable mutations that could be used for treatment of her condition. In the absence of adjuvant mutation, the patient was giving the option of palliative care and hospice referral versus consideration of palliative systemic chemotherapy with carboplatin for AUC of 5, paclitaxel 175 Mg/M2 and Keytruda 200 Mg IV every 3 weeks for 4 cycles followed by maintenance treatment with Keytruda if the patient has no evidence of disease progression after cycle #4. The patient is interested in treatment and I discussed with her the adverse effect of this treatment including but not limited to alopecia, myelosuppression, nausea and vomiting, peripheral neuropathy, liver or renal dysfunction as well as immunotherapy adverse effects. For pain management I will add MS Contin 30 mg p.o. every 12 hours in addition to her current treatment with the breakthrough pain medication with hydrocodone.  I will also refer the patient to radiation oncology for consideration of palliative radiotherapy to the painful bone lesions. For the lack of appetite I will start the patient on Medrol Dosepak and I will also refer her to the dietitian at the cancer center for recommendation. She is expected to start the first cycle of this treatment in around 10 days to give [place] some time to receive the results of the molecular studies.  In the meantime she will be undergoing palliative radiotherapy. The patient will come back for follow-up visit and around 10 days with the start of cycle number one of her chemotherapy. She will have a chemotherapy education class before the first dose of her treatment. I will call her pharmacy with prescription for Compazine 10 mg p.o. every 6 hours as needed for nausea. The patient and her husband had a lot of questions and I answered  them completely to their satisfaction during this lengthy and extended visit. She was advised to call immediately if she has any other concerning symptoms in the interval. The patient voices understanding of current disease status and treatment options and is in agreement with the current care plan.  All questions were answered. The patient knows to call the clinic with any problems, questions or concerns. We can certainly see the patient much sooner if necessary.  Thank you so much for allowing me to participate in the care of TANEIA MEALOR. I will continue to follow up the patient with you and assist in her care.  The total time spent in the appointment was 110 minutes.  Disclaimer: This note was dictated with voice recognition software. Similar sounding words can inadvertently be transcribed and may  not be corrected upon review.   Eilleen Kempf November 30, 2020, 1:47 PM

## 2020-11-30 NOTE — Progress Notes (Signed)
START ON PATHWAY REGIMEN - Non-Small Cell Lung     A cycle is every 21 days:     Pembrolizumab      Paclitaxel      Carboplatin   **Always confirm dose/schedule in your pharmacy ordering system**  Patient Characteristics: Stage IV Metastatic, Squamous, Molecular Analysis Not Elected, PS = 0, 1, Initial Chemotherapy/Immunotherapy, Candidate for Immunotherapy, PD-L1 Expression Positive 1-49% (TPS) / Negative / Not Tested / Awaiting Test Results and Immunotherapy Candidate Therapeutic Status: Stage IV Metastatic Histology: Squamous Cell ECOG Performance Status: 1 Chemotherapy/Immunotherapy Line of Therapy: Initial Chemotherapy/Immunotherapy Immunotherapy Candidate Status: Candidate for Immunotherapy PD-L1 Expression Status: Quantity Not Sufficient Intent of Therapy: Non-Curative / Palliative Intent, Discussed with Patient

## 2020-12-01 ENCOUNTER — Telehealth: Payer: Self-pay | Admitting: Internal Medicine

## 2020-12-01 NOTE — Telephone Encounter (Signed)
Scheduled appointment per 07/12 sch msg. Left message

## 2020-12-01 NOTE — Progress Notes (Signed)
Location of tumor and Histology per Pathology Report:  large cavitary right upper lobe lung mass in addition to mediastinal lymphadenopathy and metastatic disease to the bones as well as right adrenal gland.  Biopsy: stage IV (T3, N2, M1 C) non-small cell lung cancer with unknown histologic subtype but likely to be squamous cell carcinoma based on the morphology of the right upper lobe cavitary mass.  Past/Anticipated interventions by surgeon, if any:  11/17/2020   Physician: Milus Glazier, MD  Procedure: Bronchoscopy w/EBUS  Past/Anticipated interventions by medical oncology, if any: Dr Julien Nordmann     Pain issues, if any:  yes, nagging ache and burning right scapular pain & right chest area burning, aching, and nagging  SAFETY ISSUES: Prior radiation? no Pacemaker/ICD? no Possible current pregnancy? No, postmenopausal Is the patient on methotrexate? no  Current Complaints / other details:  poor appetite, pain management     Vitals:   12/06/20 1331  Pulse: 67  Resp: 18  Temp: (!) 97.3 F (36.3 C)  SpO2: 95%  Weight: 101 lb (45.8 kg)

## 2020-12-02 NOTE — Progress Notes (Signed)
Radiation Oncology         [phone] ________________________________  Initial Outpatient Consultation  Name: Kristi Jennings MRN: [id number]  Date: 12/06/2020  DOB: April 27, 1946  ZT:[id number], Valaria Good., MD  Curt Bears, MD   REFERRING PHYSICIAN: Curt Bears, MD  DIAGNOSIS: The encounter diagnosis was Non-small cell carcinoma of lung, stage 4, right ([place]).  Stage IV (T3, N2, M1 C) non-small cell lung cancer with unknown histologic subtype, likely squamous cell carcinoma based on morphology of right upper lobe cavitary mass. Diagnosed in June 2022  HISTORY OF PRESENT ILLNESS::Kristi Jennings is a 75 y.o. female who is accompanied by her husband. she is seen as a courtesy of Dr. Julien Nordmann for an opinion concerning radiation therapy as part of management for her recently diagnosed lung cancer. The patient presented to the emergency department at [facility] on 10/20/20 with a 2 month history of cough and cold symptoms with no fever; for which she initially presented to an urgent care facility and was given Augmentin which she could not tolerate. Her symptoms were not improving and she reported recent weight loss as well . She had CT scan of the chest, abdomen pelvis performed while at the [facility] ED which showed an right upper lobe irregular gas and fluid collection measuring 6.5 x 3.3 x 3.8 cm, with invasion of the right upper mediastinum, as well as a suspicious right adrenal gland metastasis measuring 3.7 x 1.9 cm.   The patient was accordingly referred to Dr. Kennith Gain, a pulmonologist at [facility]. PET scan performed on 11/09/2020 showed a hypermetabolic necrotic right upper lobe mass consistent with primary malignancy. Also seen from imaging were multistation hypermetabolic necrotic mediastinal adenopathy and hypermetabolic osseous disease in the right third rib and T8.  There was also moderately hypermetabolic X83 compression deformity noted to possibly be pathologic but was indeterminate.  There was also a hypermetabolic right adrenal mass consistent with metastasis.  MRI of the brain performed on November 15, 2020 showed punctate focus of enhancement in the right occipital lobe noted to possibly represent a vascular structure versus small metastasis.  On 11/17/20, the patient underwent bronchoscopy with EBUS under Dr. Raenette Rover [facility]). Final pathology revealed the fine-needle aspiration of the right paratracheal lymph node to be consistent with non-small cell carcinoma; no further subtyping of the tumor was made.  The patient was then referred to Dr. Julien Nordmann on 11/30/20 for evaluation and recommendation. During this visit it was noted that the patient contained to have nagging pain in the back of her right shoulder as well as around the rib cage and lower back.  Does report chronic low back pain.  The patient also reported having a lack of appetite and lost around 15 pounds in the past 6 months.  She denied having any current chest pain or shortness of breath but reported having a cough occasionally and no hemoptysis.  She denied experiencing nausea, vomiting, abdominal pain, diarrhea but has constipation and is using MiraLAX. She denied headache or visual changes. The patient expressed interest in starting chemotherapy ( systemic chemotherapy with carboplatin for AUC of 5, paclitaxel 175 Mg/M2 and Keytruda 200 Mg IV every 3 weeks for 4 cycles followed by maintenance treatment with Keytruda ).     PREVIOUS RADIATION THERAPY: No  PAST MEDICAL HISTORY:  Past Medical History:  Diagnosis Date   Borderline diabetes mellitus    Bronchogenic cancer of right lung ([place])    Dyslipidemia    Hypertension    Polymyalgia rheumatica ([place])  PAST SURGICAL HISTORY:History reviewed. No pertinent surgical history.  FAMILY HISTORY:  Family History  Problem Relation Age of Onset   Heart attack Mother    Heart disease Mother    Colon cancer Father    ALS Brother     SOCIAL HISTORY:  Social  History   Tobacco Use   Smoking status: Former    Packs/day: 1.00    Years: 43.00    Pack years: 43.00    Types: Cigarettes    Quit date: 11/23/2008    Years since quitting: 12.0   Smokeless tobacco: Never   Tobacco comments:    quit 2010  Vaping Use   Vaping Use: Never used  Substance Use Topics   Alcohol use: Not Currently   Drug use: Never    ALLERGIES:  Allergies  Allergen Reactions   Shellfish-Derived Products Hives, Itching and Rash   Augmentin [Amoxicillin-Pot Clavulanate] Nausea And Vomiting    MEDICATIONS:  Current Outpatient Medications  Medication Sig Dispense Refill   aspirin EC 81 MG tablet Take 81 mg by mouth 4 (four) times a week. Swallow whole.     atenolol (TENORMIN) 25 MG tablet Take 25 mg by mouth daily.     benzonatate (TESSALON) 200 MG capsule Take 1 capsule (200 mg total) by mouth every 8 (eight) hours. 20 capsule 0   escitalopram (LEXAPRO) 10 MG tablet Take 1 tablet by mouth at bedtime.     HYDROcodone-acetaminophen (NORCO/VICODIN) 5-325 MG tablet hydrocodone 5 mg-acetaminophen 325 mg tablet  Take 1 tablet every 4-6 hours by oral route as needed for pain for 5 days.     losartan (COZAAR) 50 MG tablet Take 1 tablet by mouth daily.     methylPREDNISolone (MEDROL DOSEPAK) 4 MG TBPK tablet Use as instructed 21 tablet 0   morphine (MS CONTIN) 30 MG 12 hr tablet Take 1 tablet (30 mg total) by mouth every 12 (twelve) hours. 60 tablet 0   oxyCODONE-acetaminophen (PERCOCET/ROXICET) 5-325 MG tablet Take 1 tablet by mouth every 4 (four) hours as needed for severe pain. For breakthru pain 30 tablet 0   rosuvastatin (CRESTOR) 10 MG tablet Take 10 mg by mouth at bedtime.     Calcium Acetate, Phos Binder, (CALCIUM ACETATE PO) Take by mouth. (Patient not taking: No sig reported)     Coenzyme Q10 100 MG capsule Take by mouth. (Patient not taking: No sig reported)     Cyanocobalamin 2500 MCG TABS Take by mouth. (Patient not taking: No sig reported)     Ergocalciferol  (VITAMIN D2 PO) Take by mouth. (Patient not taking: No sig reported)     Multiple Vitamins-Minerals (MULTIVITAMIN ADULT EXTRA C PO) Take by mouth. (Patient not taking: No sig reported)     potassium chloride (KLOR-CON) 10 MEQ tablet Klor-Con M10 mEq tablet,extended release (Patient not taking: No sig reported)     prochlorperazine (COMPAZINE) 10 MG tablet Take 1 tablet (10 mg total) by mouth every 6 (six) hours as needed for nausea or vomiting. (Patient not taking: Reported on 12/06/2020) 30 tablet 0   No current facility-administered medications for this encounter.    REVIEW OF SYSTEMS:  A 10+ POINT REVIEW OF SYSTEMS WAS OBTAINED including neurology, dermatology, psychiatry, cardiac, respiratory, lymph, extremities, GI, GU, musculoskeletal, constitutional, reproductive, HEENT.  Patient denies any headaches or visual problems.  She reports constant aching/pain in the right upper chest/shoulder area.  She denies any significant cough or hemoptysis.  She denies any significant breathing problems.  She reports decreased stamina  and some dyspnea with exertion.   PHYSICAL EXAM:  weight is 101 lb (45.8 kg). Her temperature is 97.3 F (36.3 C) (abnormal). Her pulse is 67. Her respiration is 18 and oxygen saturation is 95%.   General: Alert and oriented, in no acute distress HEENT: Head is normocephalic. Extraocular movements are intact.  Neck: Neck is supple, no palpable cervical or supraclavicular lymphadenopathy. Heart: Regular in rate and rhythm with no murmurs, rubs, or gallops. Chest: Clear to auscultation bilaterally, with no rhonchi, wheezes, or rales.  Palpation along the upper back and right shoulder area reveals no areas of point tenderness.  Palpation/percussion over the spine area reveals no point tenderness. Abdomen: Soft, nontender, nondistended, with no rigidity or guarding. Extremities: No cyanosis or edema. Lymphatics: see Neck Exam Skin: No concerning lesions. Musculoskeletal:  symmetric strength and muscle tone throughout.  Good range of movement in arm and shoulder areas Neurologic: Cranial nerves II through XII are grossly intact. No obvious focalities. Speech is fluent. Coordination is intact. Psychiatric: Judgment and insight are intact. Affect is appropriate.   ECOG = 1  0 - Asymptomatic (Fully active, able to carry on all predisease activities without restriction)  1 - Symptomatic but completely ambulatory (Restricted in physically strenuous activity but ambulatory and able to carry out work of a light or sedentary nature. For example, light housework, office work)  2 - Symptomatic, <50% in bed during the day (Ambulatory and capable of all self care but unable to carry out any work activities. Up and about more than 50% of waking hours)  3 - Symptomatic, >50% in bed, but not bedbound (Capable of only limited self-care, confined to bed or chair 50% or more of waking hours)  4 - Bedbound (Completely disabled. Cannot carry on any self-care. Totally confined to bed or chair)  5 - Death   Eustace Pen MM, Creech RH, Tormey DC, et al. [phone]). "Toxicity and response criteria of the [facility]". [place] Oncol. 5 (6): 649-55  LABORATORY DATA:  Lab Results  Component Value Date   WBC 10.4 11/30/2020   HGB 12.1 11/30/2020   HCT 37.1 11/30/2020   MCV 91.8 11/30/2020   PLT 307 11/30/2020   NEUTROABS 7.0 11/30/2020   Lab Results  Component Value Date   NA 136 11/30/2020   K 4.3 11/30/2020   CL 98 11/30/2020   CO2 27 11/30/2020   GLUCOSE 105 (H) 11/30/2020   CREATININE 0.67 11/30/2020   CALCIUM 9.5 11/30/2020      RADIOGRAPHY: MR OUTSIDE FILMS HEAD/FACE  Result Date: 12/06/2020 This examination belongs to an outside facility and is stored here for comparison purposes only.  Contact the originating outside institution for any associated report or interpretation.     IMPRESSION: Stage IV (T3, N2, M1 C) non-small cell lung cancer  with unknown histologic subtype, likely squamous cell carcinoma based on morphology of right upper lobe cavitary mass. Diagnosed in June 2022  The patient has met with medical oncology and agrees to palliative chemotherapy and immunotherapy unless she has some actionable mutations.  she has been referred to radiation oncology for consideration for short course of treatment concerning her bone metastasis and associated pain.  She would be a good candidate for short course of radiation therapy directed at the rib involvement along the right upper chest and associated soft tissue component.  Since she is not having any respiratory issues such as hemoptysis or postobstructive pneumonia symptoms would not recommend radiation to the primary lung  mass at this time as to expedite her radiation treatment and avoid side effects.  She does not appear to be symptomatic from her small osseous metastasis at T8 and therefore would not recommend palliative radiation treatments to this area.    Today, I talked to the patient and husband about the findings and work-up thus far.  We discussed the natural history of non-small cell lung cancer and general treatment, highlighting the role of radiotherapy in the management.  We discussed the available radiation techniques, and focused on the details of logistics and delivery.  We reviewed the anticipated acute and late sequelae associated with radiation in this setting.  The patient was encouraged to ask questions that I answered to the best of my ability.  A patient consent form was discussed and signed.  We retained a copy for our records.  The patient would like to proceed with radiation and will be scheduled for CT simulation.  PLAN: She will proceed with CT simulation later today.  Treatments to begin 21st.  Anticipate 10 treatments directed at the mass and associated rib destruction in the right third rib region.   60 minutes of total time was spent for this patient  encounter, including preparation, face-to-face counseling with the patient and coordination of care, physical exam, and documentation of the encounter.   ------------------------------------------------  Blair Promise, PhD, MD  This document serves as a record of services personally performed by Gery Pray, MD. It was created on his behalf by Roney Mans, a trained medical scribe. The creation of this record is based on the scribe's personal observations and the provider's statements to them. This document has been checked and approved by the attending provider.

## 2020-12-03 ENCOUNTER — Encounter: Payer: Self-pay | Admitting: *Deleted

## 2020-12-03 NOTE — Progress Notes (Signed)
Per Dr. Julien Nordmann, I took CD to radiology to be scanned into PACS.

## 2020-12-06 ENCOUNTER — Ambulatory Visit
Admission: RE | Admit: 2020-12-06 | Discharge: 2020-12-06 | Disposition: A | Discharge status: Home / Self Care | Payer: Self-pay | Source: Ambulatory Visit | Attending: Internal Medicine | Admitting: Internal Medicine

## 2020-12-06 ENCOUNTER — Other Ambulatory Visit (HOSPITAL_COMMUNITY): Payer: Self-pay | Admitting: Internal Medicine

## 2020-12-06 ENCOUNTER — Encounter: Payer: Self-pay | Admitting: Radiation Oncology

## 2020-12-06 ENCOUNTER — Ambulatory Visit
Admission: RE | Admit: 2020-12-06 | Discharge: 2020-12-06 | Disposition: A | Discharge status: Home / Self Care | Payer: Medicare HMO | Source: Ambulatory Visit | Attending: Radiation Oncology | Admitting: Radiation Oncology

## 2020-12-06 ENCOUNTER — Other Ambulatory Visit: Payer: Self-pay

## 2020-12-06 VITALS — HR 67 | Temp 97.3°F | Resp 18 | Wt 101.0 lb

## 2020-12-06 DIAGNOSIS — C801 Malignant (primary) neoplasm, unspecified: Secondary | ICD-10-CM

## 2020-12-06 DIAGNOSIS — C3491 Malignant neoplasm of unspecified part of right bronchus or lung: Secondary | ICD-10-CM

## 2020-12-06 DIAGNOSIS — M353 Polymyalgia rheumatica: Secondary | ICD-10-CM | POA: Insufficient documentation

## 2020-12-06 DIAGNOSIS — C7971 Secondary malignant neoplasm of right adrenal gland: Secondary | ICD-10-CM | POA: Insufficient documentation

## 2020-12-06 DIAGNOSIS — Z79899 Other long term (current) drug therapy: Secondary | ICD-10-CM | POA: Diagnosis not present

## 2020-12-06 DIAGNOSIS — C3411 Malignant neoplasm of upper lobe, right bronchus or lung: Secondary | ICD-10-CM | POA: Insufficient documentation

## 2020-12-06 DIAGNOSIS — Z510 Encounter for antineoplastic radiation therapy: Secondary | ICD-10-CM | POA: Diagnosis not present

## 2020-12-06 DIAGNOSIS — C7951 Secondary malignant neoplasm of bone: Secondary | ICD-10-CM | POA: Insufficient documentation

## 2020-12-06 DIAGNOSIS — I10 Essential (primary) hypertension: Secondary | ICD-10-CM | POA: Diagnosis not present

## 2020-12-06 DIAGNOSIS — E785 Hyperlipidemia, unspecified: Secondary | ICD-10-CM | POA: Diagnosis not present

## 2020-12-06 DIAGNOSIS — Z87891 Personal history of nicotine dependence: Secondary | ICD-10-CM | POA: Diagnosis not present

## 2020-12-06 MED ORDER — OXYCODONE-ACETAMINOPHEN 5-325 MG PO TABS: 1.0000 | ORAL_TABLET | ORAL | 0 refills | Status: DC | PRN

## 2020-12-06 NOTE — Progress Notes (Signed)
See MD note for nursing evaluation. °

## 2020-12-06 NOTE — Progress Notes (Deleted)
[facility] OFFICE PROGRESS NOTE  [name], MD [address] [place] [address]  DIAGNOSIS:  Stage IV (T3, N2, M1 C) non-small cell lung cancer with unknown histologic subtype but likely to be squamous cell carcinoma based on the morphology of the right upper lobe cavitary mass.  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  Molecular Studies: ***  PRIOR THERAPY: None  CURRENT THERAPY: 1) palliative radiotherapy to the painful metastatic rib lesion under the year of Dr. Sondra Come.  Last treatment expected for [place]*** 2) palliative systemic chemotherapy with carboplatin for an AUC of 5, paclitaxel 175 mg per metered squared, Keytruda 200 mg IV every 3 weeks with Neulasta support.  First dose expected today on 12/09/2020.  INTERVAL HISTORY: Kristi Jennings 75 y.o. female returns to the clinic today for a follow-up visit accompanied by her husband.  The patient was recently diagnosed with stage IV lung cancer.  She met with Dr. Sondra Come from radiation oncology last week who is planning on performing palliative radiation to the painful metastatic rib lesion.  Otherwise, the patient had molecular studies performed which show that she is negative for any actionable mutations.  She is scheduled to start palliative systemic chemotherapy today.  At her last appointment, the patient was endorsing decreased appetite.  Dr. Julien Nordmann gave her prescription for Medrol Dosepak which has _her appetite.  She denies any fever, chills, or night sweats.  She reports some dyspnea on exertion and a mild cough.  Chest pain?  She denies any hemoptysis.  She denies any nausea, vomiting, diarrhea, or constipation.  She denies any headache or visual changes.  The patient is here today for evaluation before starting cycle #1.  MEDICAL  HISTORY: Past Medical History:  Diagnosis Date   Borderline diabetes mellitus    Bronchogenic cancer of right lung ([facility])    Dyslipidemia    Hypertension    Polymyalgia rheumatica ([facility])     ALLERGIES:  is allergic to shellfish-derived products and augmentin [amoxicillin-pot clavulanate].  MEDICATIONS:  Current Outpatient Medications  Medication Sig Dispense Refill   aspirin EC 81 MG tablet Take 81 mg by mouth 4 (four) times a week. Swallow whole.     atenolol (TENORMIN) 25 MG tablet Take 25 mg by mouth daily.     benzonatate (TESSALON) 200 MG capsule Take 1 capsule (200 mg total) by mouth every 8 (eight) hours. 20 capsule 0   Calcium Acetate, Phos Binder, (CALCIUM ACETATE PO) Take by mouth. (Patient not taking: No sig reported)     Coenzyme Q10 100 MG capsule Take by mouth. (Patient not taking: No sig reported)     Cyanocobalamin 2500 MCG TABS Take by mouth. (Patient not taking: No sig reported)     Ergocalciferol (VITAMIN D2 PO) Take by mouth. (Patient not taking: No sig reported)     escitalopram (LEXAPRO) 10 MG tablet Take 1 tablet by mouth at bedtime.     HYDROcodone-acetaminophen (NORCO/VICODIN) 5-325 MG tablet hydrocodone 5 mg-acetaminophen 325 mg tablet  Take 1 tablet every 4-6 hours by oral route as needed for pain for 5 days.     losartan (COZAAR) 50 MG tablet Take 1 tablet by mouth daily.     methylPREDNISolone (MEDROL DOSEPAK) 4 MG TBPK tablet Use as instructed 21 tablet 0   morphine (MS CONTIN) 30  MG 12 hr tablet Take 1 tablet (30 mg total) by mouth every 12 (twelve) hours. 60 tablet 0   Multiple Vitamins-Minerals (MULTIVITAMIN ADULT EXTRA C PO) Take by mouth. (Patient not taking: No sig reported)     oxyCODONE-acetaminophen (PERCOCET/ROXICET) 5-325 MG tablet Take 1 tablet by mouth every 4 (four) hours as needed for severe pain. For breakthru pain 30 tablet 0   potassium chloride (KLOR-CON) 10 MEQ tablet Klor-Con M10 mEq tablet,extended release (Patient not taking: No sig  reported)     prochlorperazine (COMPAZINE) 10 MG tablet Take 1 tablet (10 mg total) by mouth every 6 (six) hours as needed for nausea or vomiting. (Patient not taking: Reported on 12/06/2020) 30 tablet 0   rosuvastatin (CRESTOR) 10 MG tablet Take 10 mg by mouth at bedtime.     No current facility-administered medications for this visit.    SURGICAL HISTORY: No past surgical history on file.  REVIEW OF SYSTEMS:   Review of Systems  Constitutional: Negative for appetite change, chills, fatigue, fever and unexpected weight change.  HENT:   Negative for mouth sores, nosebleeds, sore throat and trouble swallowing.   Eyes: Negative for eye problems and icterus.  Respiratory: Negative for cough, hemoptysis, shortness of breath and wheezing.   Cardiovascular: Negative for chest pain and leg swelling.  Gastrointestinal: Negative for abdominal pain, constipation, diarrhea, nausea and vomiting.  Genitourinary: Negative for bladder incontinence, difficulty urinating, dysuria, frequency and hematuria.   Musculoskeletal: Negative for back pain, gait problem, neck pain and neck stiffness.  Skin: Negative for itching and rash.  Neurological: Negative for dizziness, extremity weakness, gait problem, headaches, light-headedness and seizures.  Hematological: Negative for adenopathy. Does not bruise/bleed easily.  Psychiatric/Behavioral: Negative for confusion, depression and sleep disturbance. The patient is not nervous/anxious.     PHYSICAL EXAMINATION:  There were no vitals taken for this visit.  ECOG PERFORMANCE STATUS: {CHL ONC ECOG [id number]  Physical Exam  Constitutional: Oriented to person, place, and time and well-developed, well-nourished, and in no distress. No distress.  HENT:  Head: Normocephalic and atraumatic.  Mouth/Throat: Oropharynx is clear and moist. No oropharyngeal exudate.  Eyes: Conjunctivae are normal. Right eye exhibits no discharge. Left eye exhibits no discharge. No  scleral icterus.  Neck: Normal range of motion. Neck supple.  Cardiovascular: Normal rate, regular rhythm, normal heart sounds and intact distal pulses.   Pulmonary/Chest: Effort normal and breath sounds normal. No respiratory distress. No wheezes. No rales.  Abdominal: Soft. Bowel sounds are normal. Exhibits no distension and no mass. There is no tenderness.  Musculoskeletal: Normal range of motion. Exhibits no edema.  Lymphadenopathy:    No cervical adenopathy.  Neurological: Alert and oriented to person, place, and time. Exhibits normal muscle tone. Gait normal. Coordination normal.  Skin: Skin is warm and dry. No rash noted. Not diaphoretic. No erythema. No pallor.  Psychiatric: Mood, memory and judgment normal.  Vitals reviewed.  LABORATORY DATA: Lab Results  Component Value Date   WBC 10.4 11/30/2020   HGB 12.1 11/30/2020   HCT 37.1 11/30/2020   MCV 91.8 11/30/2020   PLT 307 11/30/2020      Chemistry      Component Value Date/Time   NA 136 11/30/2020 1342   K 4.3 11/30/2020 1342   CL 98 11/30/2020 1342   CO2 27 11/30/2020 1342   BUN 20 11/30/2020 1342   CREATININE 0.67 11/30/2020 1342      Component Value Date/Time   CALCIUM 9.5 11/30/2020 1342   ALKPHOS 74  11/30/2020 1342   AST 15 11/30/2020 1342   ALT 10 11/30/2020 1342   BILITOT 0.5 11/30/2020 1342       RADIOGRAPHIC STUDIES:  MR OUTSIDE FILMS HEAD/FACE  Result Date: 12/06/2020 This examination belongs to an outside facility and is stored here for comparison purposes only.  Contact the originating outside institution for any associated report or interpretation.    ASSESSMENT/PLAN:  This is a very pleasant anxious 75 year old Caucasian female recently diagnosed with stage IV (T3, N2, M1 C) non-small cell lung cancer with unknown histologic subtype but likely to be squamous cell carcinoma based on the morphology of the right upper lobe cavitary mass.  This was diagnosed in June 2022 and presented with large  cavitary right upper lobe lung mass in addition to mediastinal lymphadenopathy and metastatic disease to the bones as well as right adrenal gland. Her molecular studies show ***.   She is planning to undergoing palliative radiation to the painful metastatic rib lesions under the care of Dr. Sondra Come.   She is scheduled to start palliative systemic chemotherapy with carboplatin for an AUC of 5, paclitaxel 175 mg per metered squared, Keytruda 20 mg IV every 3 weeks with Neulasta support.  First dose expected today.  The patient was seen with Dr. Julien Nordmann today.  Labs reviewed.  Recommend that she _with cycle #1 today scheduled.  We will see her back for follow-up visit in 1 week for evaluation and for 1 week follow-up visit to manage any adverse side effects of treatment.  The patient was advised to call immediately if she has any concerning symptoms in the interval. The patient voices understanding of current disease status and treatment options and is in agreement with the current care plan. All questions were answered. The patient knows to call the clinic with any problems, questions or concerns. We can certainly see the patient much sooner if necessary      No orders of the defined types were placed in this encounter.    I spent {CHL ONC TIME VISIT - ZRAQT:[id number]} counseling the patient face to face. The total time spent in the appointment was {CHL ONC TIME VISIT - GYBWL:[id number]}.   L , PA-C 12/06/20

## 2020-12-07 ENCOUNTER — Telehealth: Payer: Self-pay | Admitting: Internal Medicine

## 2020-12-07 ENCOUNTER — Inpatient Hospital Stay: Payer: Medicare HMO

## 2020-12-07 ENCOUNTER — Encounter: Payer: Self-pay | Admitting: *Deleted

## 2020-12-07 ENCOUNTER — Other Ambulatory Visit: Payer: Self-pay

## 2020-12-07 NOTE — Telephone Encounter (Signed)
Appts were scheduled per LOS. Spoke with patient about appts, however patient did not want to do chemo and radiation at the same time. After speaking with provider, postponed patients first treatment. Gave patient calendar during chemo education. Confirmed all appts

## 2020-12-07 NOTE — Progress Notes (Signed)
[facility] Psychosocial Distress Screening Clinical Social Work  Clinical Social Work was referred by distress screening protocol.  The patient scored a 9 on the Psychosocial Distress Thermometer which indicates severe distress. Clinical Social Worker contacted patient by phone to assess for distress and other psychosocial needs.  CSW was unable to reach patient.  CSW left a voicemail offering support and encouraging patient to return call.   ONCBCN DISTRESS SCREENING 12/06/2020  Screening Type Initial Screening  Distress experienced in past week (1-10) 9  Family Problem type Partner  Emotional problem type Adjusting to appearance changes  Information Concerns Type Lack of info about maintaining fitness  Physical Problem type Pain;Constipation/diarrhea;Skin dry/itchy  Referral to clinical social work Yes    Johnnye Lana, MSW, LCSW, OSW-C Clinical Social Worker [place] [phone]

## 2020-12-08 ENCOUNTER — Telehealth: Payer: Self-pay

## 2020-12-08 ENCOUNTER — Telehealth: Payer: Self-pay | Admitting: *Deleted

## 2020-12-08 ENCOUNTER — Telehealth: Payer: Self-pay | Admitting: Radiology

## 2020-12-08 ENCOUNTER — Encounter: Payer: Self-pay | Admitting: General Practice

## 2020-12-08 DIAGNOSIS — C7971 Secondary malignant neoplasm of right adrenal gland: Secondary | ICD-10-CM | POA: Diagnosis not present

## 2020-12-08 DIAGNOSIS — C7951 Secondary malignant neoplasm of bone: Secondary | ICD-10-CM | POA: Diagnosis not present

## 2020-12-08 DIAGNOSIS — C3411 Malignant neoplasm of upper lobe, right bronchus or lung: Secondary | ICD-10-CM | POA: Diagnosis not present

## 2020-12-08 DIAGNOSIS — Z510 Encounter for antineoplastic radiation therapy: Secondary | ICD-10-CM | POA: Diagnosis not present

## 2020-12-08 DIAGNOSIS — Z87891 Personal history of nicotine dependence: Secondary | ICD-10-CM | POA: Diagnosis not present

## 2020-12-08 NOTE — Telephone Encounter (Signed)
Per Dr Sondra Come, notified patient that we will postpone treatment until Monday 12/13/2020. Patient verbalized understanding.

## 2020-12-08 NOTE — Telephone Encounter (Signed)
Pt called stating she is having diarrhea today. Denies fever and abdominal pain. Pt states she has been taking Miralax daily because it was recommended to her since he has started taking pain medication and not because she was constipated. Pt has not started chemo and denies eating/drinking or taking anything new to her other than Miralax and pain medication.   Pt has been advised to d/c the Miralax for now to see if this resolves her diarrhea and if she needs help in the mean time, she can take Imodium. Pt has also been advised to monitor her temperature to ensure she doesn't develop one. Pt expressed understanding of this information.

## 2020-12-08 NOTE — Telephone Encounter (Signed)
Patient is concerned that she has severe diarrhea which may impact her ability to start radiation tomorrow. Please advise.

## 2020-12-08 NOTE — Progress Notes (Signed)
Kristi Jennings Distress Screening Clinical Social Work  Clinical Social Work was referred by distress screening protocol.  The patient scored a 9 on the [name] which indicates severe distress. Clinical Social Worker contacted patient by phone to assess for distress and other Jennings needs. CSW and patient discussed common feeling and emotions when being diagnosed with cancer, and the importance of support during treatment.  CSW informed patient of the support team and support services at [facility].  CSW provided contact information and encouraged patient to call with any questions or concerns. She is currently struggling w physical side effects, including diarrhea, which is distressing.  She is concerned this might impact her radiation treatment tomorrow.  Amy H RN notified and asked to call her.  Prior to this diagnosis,  she has had no health issues - adjusting to the ups and downs of cancer treatment is very difficult.  She struggles to eat/maintain weight. Is finding her way through the initial phases of cancer diagnosis and treatment, this has resulted in role changes and other challenges.  She appreciates contact/calls from [facility] providers.    ONCBCN DISTRESS SCREENING 12/06/2020  Screening Type Initial Screening  Distress experienced in past week (1-10) 9  Family Problem type Partner  Emotional problem type Adjusting to appearance changes  Information Concerns Type Lack of info about maintaining fitness  Physical Problem type Pain;Constipation/diarrhea;Skin dry/itchy  Referral to clinical social work Yes    Clinical Social Worker follow up needed: No.She will call us as needed for support/resources.   If yes, follow up plan:  Beverely Pace, [place], LCSW Clinical Social Worker Phone:  [phone]

## 2020-12-08 NOTE — Telephone Encounter (Signed)
I called Mr and Ms Drouillard.  We talked about her plan of care.  I help to clarify questions.  They understand that radiation starts tomorrow. They were thankful for the clarification.

## 2020-12-08 NOTE — Telephone Encounter (Signed)
I called Kristi Jennings today to help clarify questions.  I was unable to reach her but her husband states she is resting and to call back. I will call back at a later time.

## 2020-12-09 ENCOUNTER — Ambulatory Visit: Payer: Medicare HMO

## 2020-12-09 ENCOUNTER — Ambulatory Visit: Payer: Medicare HMO | Admitting: Physician Assistant

## 2020-12-09 ENCOUNTER — Other Ambulatory Visit: Payer: Medicare HMO

## 2020-12-09 ENCOUNTER — Ambulatory Visit: Payer: Medicare HMO | Admitting: Radiation Oncology

## 2020-12-10 ENCOUNTER — Ambulatory Visit: Payer: Medicare HMO

## 2020-12-10 DIAGNOSIS — C3490 Malignant neoplasm of unspecified part of unspecified bronchus or lung: Secondary | ICD-10-CM | POA: Diagnosis not present

## 2020-12-11 ENCOUNTER — Ambulatory Visit: Payer: Medicare HMO

## 2020-12-13 ENCOUNTER — Telehealth: Payer: Self-pay | Admitting: *Deleted

## 2020-12-13 ENCOUNTER — Telehealth: Payer: Self-pay | Admitting: Internal Medicine

## 2020-12-13 ENCOUNTER — Ambulatory Visit
Admission: RE | Admit: 2020-12-13 | Discharge: 2020-12-13 | Disposition: A | Discharge status: Home / Self Care | Payer: Medicare HMO | Source: Ambulatory Visit | Attending: Radiation Oncology | Admitting: Radiation Oncology

## 2020-12-13 ENCOUNTER — Other Ambulatory Visit: Payer: Self-pay

## 2020-12-13 ENCOUNTER — Encounter: Payer: Self-pay | Admitting: *Deleted

## 2020-12-13 DIAGNOSIS — C7951 Secondary malignant neoplasm of bone: Secondary | ICD-10-CM | POA: Diagnosis not present

## 2020-12-13 DIAGNOSIS — Z87891 Personal history of nicotine dependence: Secondary | ICD-10-CM | POA: Diagnosis not present

## 2020-12-13 DIAGNOSIS — C3411 Malignant neoplasm of upper lobe, right bronchus or lung: Secondary | ICD-10-CM | POA: Diagnosis not present

## 2020-12-13 DIAGNOSIS — C7971 Secondary malignant neoplasm of right adrenal gland: Secondary | ICD-10-CM | POA: Diagnosis not present

## 2020-12-13 DIAGNOSIS — Z510 Encounter for antineoplastic radiation therapy: Secondary | ICD-10-CM | POA: Diagnosis not present

## 2020-12-13 NOTE — Telephone Encounter (Signed)
Patient and friend called me. They had questions about her plan of care and schedule. I updated.

## 2020-12-13 NOTE — Progress Notes (Signed)
I followed up on Ms. Kristi Jennings's molecular test results.  I printed and will place on Dr. Worthy Flank desk.

## 2020-12-13 NOTE — Telephone Encounter (Signed)
Scheduled appointment per 07/25 sch msg. Left message.

## 2020-12-14 ENCOUNTER — Ambulatory Visit
Admission: RE | Admit: 2020-12-14 | Discharge: 2020-12-14 | Disposition: A | Discharge status: Home / Self Care | Payer: Medicare HMO | Source: Ambulatory Visit | Attending: Radiation Oncology | Admitting: Radiation Oncology

## 2020-12-14 DIAGNOSIS — Z87891 Personal history of nicotine dependence: Secondary | ICD-10-CM | POA: Diagnosis not present

## 2020-12-14 DIAGNOSIS — Z510 Encounter for antineoplastic radiation therapy: Secondary | ICD-10-CM | POA: Diagnosis not present

## 2020-12-14 DIAGNOSIS — C3411 Malignant neoplasm of upper lobe, right bronchus or lung: Secondary | ICD-10-CM | POA: Diagnosis not present

## 2020-12-14 DIAGNOSIS — C7971 Secondary malignant neoplasm of right adrenal gland: Secondary | ICD-10-CM | POA: Diagnosis not present

## 2020-12-14 DIAGNOSIS — C7951 Secondary malignant neoplasm of bone: Secondary | ICD-10-CM | POA: Diagnosis not present

## 2020-12-15 ENCOUNTER — Encounter: Payer: Self-pay | Admitting: Internal Medicine

## 2020-12-15 ENCOUNTER — Inpatient Hospital Stay: Payer: Medicare HMO

## 2020-12-15 ENCOUNTER — Ambulatory Visit
Admission: RE | Admit: 2020-12-15 | Discharge: 2020-12-15 | Disposition: A | Discharge status: Home / Self Care | Payer: Medicare HMO | Source: Ambulatory Visit | Attending: Radiation Oncology | Admitting: Radiation Oncology

## 2020-12-15 ENCOUNTER — Other Ambulatory Visit: Payer: Self-pay

## 2020-12-15 ENCOUNTER — Inpatient Hospital Stay (HOSPITAL_BASED_OUTPATIENT_CLINIC_OR_DEPARTMENT_OTHER): Payer: Medicare HMO | Admitting: Internal Medicine

## 2020-12-15 VITALS — BP 102/41 | HR 72 | Temp 97.7°F | Resp 18 | Ht 65.0 in | Wt 101.7 lb

## 2020-12-15 DIAGNOSIS — Z87891 Personal history of nicotine dependence: Secondary | ICD-10-CM | POA: Diagnosis not present

## 2020-12-15 DIAGNOSIS — Z5111 Encounter for antineoplastic chemotherapy: Secondary | ICD-10-CM

## 2020-12-15 DIAGNOSIS — G893 Neoplasm related pain (acute) (chronic): Secondary | ICD-10-CM | POA: Diagnosis not present

## 2020-12-15 DIAGNOSIS — Z7952 Long term (current) use of systemic steroids: Secondary | ICD-10-CM | POA: Diagnosis not present

## 2020-12-15 DIAGNOSIS — Z5112 Encounter for antineoplastic immunotherapy: Secondary | ICD-10-CM

## 2020-12-15 DIAGNOSIS — I10 Essential (primary) hypertension: Secondary | ICD-10-CM

## 2020-12-15 DIAGNOSIS — Z79899 Other long term (current) drug therapy: Secondary | ICD-10-CM | POA: Diagnosis not present

## 2020-12-15 DIAGNOSIS — Z510 Encounter for antineoplastic radiation therapy: Secondary | ICD-10-CM | POA: Diagnosis not present

## 2020-12-15 DIAGNOSIS — E785 Hyperlipidemia, unspecified: Secondary | ICD-10-CM | POA: Diagnosis not present

## 2020-12-15 DIAGNOSIS — C7951 Secondary malignant neoplasm of bone: Secondary | ICD-10-CM | POA: Diagnosis not present

## 2020-12-15 DIAGNOSIS — C3491 Malignant neoplasm of unspecified part of right bronchus or lung: Secondary | ICD-10-CM

## 2020-12-15 DIAGNOSIS — C3411 Malignant neoplasm of upper lobe, right bronchus or lung: Secondary | ICD-10-CM | POA: Diagnosis not present

## 2020-12-15 DIAGNOSIS — C7971 Secondary malignant neoplasm of right adrenal gland: Secondary | ICD-10-CM | POA: Diagnosis not present

## 2020-12-15 LAB — CBC WITH DIFFERENTIAL (CANCER CENTER ONLY)
Abs Immature Granulocytes: 0.08 10*3/uL — ABNORMAL HIGH (ref 0.00–0.07)
Basophils Absolute: 0 10*3/uL (ref 0.0–0.1)
Basophils Relative: 0 %
Eosinophils Absolute: 0.2 10*3/uL (ref 0.0–0.5)
Eosinophils Relative: 2 %
HCT: 34.2 % — ABNORMAL LOW (ref 36.0–46.0)
Hemoglobin: 11.3 g/dL — ABNORMAL LOW (ref 12.0–15.0)
Immature Granulocytes: 1 %
Lymphocytes Relative: 8 %
Lymphs Abs: 1.1 10*3/uL (ref 0.7–4.0)
MCH: 29.6 pg (ref 26.0–34.0)
MCHC: 33 g/dL (ref 30.0–36.0)
MCV: 89.5 fL (ref 80.0–100.0)
Monocytes Absolute: 2 10*3/uL — ABNORMAL HIGH (ref 0.1–1.0)
Monocytes Relative: 15 %
Neutro Abs: 10.4 10*3/uL — ABNORMAL HIGH (ref 1.7–7.7)
Neutrophils Relative %: 74 %
Platelet Count: 291 10*3/uL (ref 150–400)
RBC: 3.82 MIL/uL — ABNORMAL LOW (ref 3.87–5.11)
RDW: 13.9 % (ref 11.5–15.5)
WBC Count: 13.9 10*3/uL — ABNORMAL HIGH (ref 4.0–10.5)
nRBC: 0 % (ref 0.0–0.2)

## 2020-12-15 LAB — CMP (CANCER CENTER ONLY)
ALT: 10 U/L (ref 0–44)
AST: 17 U/L (ref 15–41)
Albumin: 3.1 g/dL — ABNORMAL LOW (ref 3.5–5.0)
Alkaline Phosphatase: 86 U/L (ref 38–126)
Anion gap: 9 (ref 5–15)
BUN: 14 mg/dL (ref 8–23)
CO2: 27 mmol/L (ref 22–32)
Calcium: 9 mg/dL (ref 8.9–10.3)
Chloride: 93 mmol/L — ABNORMAL LOW (ref 98–111)
Creatinine: 0.62 mg/dL (ref 0.44–1.00)
GFR, Estimated: >60 mL/min (ref >60)
Glucose, Bld: 125 mg/dL — ABNORMAL HIGH (ref 70–99)
Potassium: 4.4 mmol/L (ref 3.5–5.1)
Sodium: 129 mmol/L — ABNORMAL LOW (ref 135–145)
Total Bilirubin: 0.6 mg/dL (ref 0.3–1.2)
Total Protein: 6.7 g/dL (ref 6.5–8.1)

## 2020-12-15 LAB — TSH: TSH: 1.472 u[IU]/mL (ref 0.308–3.960)

## 2020-12-15 NOTE — Progress Notes (Signed)
DISCONTINUE ON PATHWAY REGIMEN - Non-Small Cell Lung     A cycle is every 21 days:     Pembrolizumab      Paclitaxel      Carboplatin   **Always confirm dose/schedule in your pharmacy ordering system**  REASON: Other Reason PRIOR TREATMENT: [id number]: Pembrolizumab 200 mg + Carboplatin AUC=6 + Paclitaxel 200 mg/m2 q21 Days x 4 Cycles TREATMENT RESPONSE: Unable to Evaluate  START ON PATHWAY REGIMEN - Non-Small Cell Lung     A cycle is every 21 days:     Pembrolizumab      Pemetrexed      Carboplatin   **Always confirm dose/schedule in your pharmacy ordering system**  Patient Characteristics: Stage IV Metastatic, Nonsquamous, Molecular Analysis Completed, Molecular Alteration Present and Targeted Therapy Exhausted OR EGFR Exon 20+ or KRAS G12C+ Present and No Prior Chemo/Immunotherapy OR No Alteration Present, Initial  Chemotherapy/Immunotherapy, PS = 0, 1, BRAF/MET/KRAS Mutation Positive, Candidate for Immunotherapy, PD-L1 Expression Positive 1-49% (TPS) / Negative / Not Tested / Awaiting Test Results and Immunotherapy Candidate Therapeutic Status: Stage IV Metastatic Histology: Nonsquamous Cell Broad Molecular Profiling Status: Molecular Analysis Completed Molecular Analysis Results: EGFR Exon 20 Insertion Present or KRAS G12C Present, and No Prior Chemo/Immunotherapy ECOG Performance Status: 1 Chemotherapy/Immunotherapy Line of Therapy: Initial Chemotherapy/Immunotherapy Immunotherapy Candidate Status: Candidate for Immunotherapy PD-L1 Expression Status: Quantity Not Sufficient Intent of Therapy: Non-Curative / Palliative Intent, Discussed with Patient

## 2020-12-15 NOTE — Progress Notes (Signed)
[place] [phone]   [phone]  OFFICE PROGRESS NOTE  [name], MD [address] [place] [address]  DIAGNOSIS:  Stage IV (T3, N2, M1 C) non-small cell lung cancer with unknown histologic subtype but likely to be squamous cell carcinoma based on the morphology of the right upper lobe cavitary mass.  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  DETECTED ALTERATION(S) / BIOMARKER(S) % CFDNA OR AMPLIFICATION ASSOCIATED FDA-APPROVED THERAPIES CLINICAL TRIAL AVAILABILITY KRASG12C 8.9%  Sotorasib Yes [id number] 7.2% None  Yes  PRIOR THERAPY: Palliative radiotherapy to the painful bone lesions under the care of Dr. Sondra Come.  CURRENT THERAPY:  Palliative systemic chemotherapy with carboplatin for AUC of 5, Alimta 500 Mg/M2 and Keytruda 200 Mg IV every 3 weeks.  She is expected to start the first cycle of this treatment on December 29, 2020.  INTERVAL HISTORY: Kristi Jennings 75 y.o. female returns to the clinic today for follow-up visit accompanied by her neighbor and caregiver Moshe Salisbury.  The patient is feeling fine today with no concerning complaints except for the pain on the rib cage bilaterally.  She is currently on MS Contin 30 mg every 12 hours in addition to [place] for breakthrough pain.  The patient also started palliative radiotherapy to the painful bone lesions under the care of Dr. Sondra Come.  She denied having any current shortness of breath except with exertion with no cough or hemoptysis.  She has no nausea, vomiting, diarrhea or constipation.  She has no headache or visual changes.  Her weight has been stable.  The patient had molecular studies by [facility] [employer] and it showed KRAS G12C mutation.  She was supposed to start systemic chemotherapy this week but her husband is  concerned about her generalized weakness and they wanted to delay her treatment until August 10 until she is ready to complete the radiotherapy.  She is here today for evaluation and discussion of her molecular studies and treatment options.  MEDICAL HISTORY: Past Medical History:  Diagnosis Date   Borderline diabetes mellitus    Bronchogenic cancer of right lung (HCC)    Dyslipidemia    Hypertension    Polymyalgia rheumatica (HCC)     ALLERGIES:  is allergic to shellfish-derived products and augmentin [amoxicillin-pot clavulanate].  MEDICATIONS:  Current Outpatient Medications  Medication Sig Dispense Refill   aspirin EC 81 MG tablet Take 81 mg by mouth 4 (four) times a week. Swallow whole.     atenolol (TENORMIN) 25 MG tablet Take 25 mg by mouth daily.     benzonatate (TESSALON) 200 MG capsule Take 1 capsule (200 mg total) by mouth every 8 (eight) hours. 20 capsule 0   Calcium Acetate, Phos Binder, (CALCIUM ACETATE PO) Take by mouth. (Patient not taking: No sig reported)     Coenzyme Q10 100 MG capsule Take by mouth. (Patient not taking: No sig reported)     Cyanocobalamin 2500 MCG TABS Take by mouth. (Patient not taking: No sig reported)     Ergocalciferol (VITAMIN D2 PO) Take by mouth. (Patient not taking: No sig reported)     escitalopram (LEXAPRO) 10 MG tablet Take 1 tablet by mouth at bedtime.     HYDROcodone-acetaminophen (NORCO/VICODIN) 5-325 MG tablet hydrocodone 5 mg-acetaminophen 325 mg tablet  Take 1 tablet every 4-6  hours by oral route as needed for pain for 5 days.     losartan (COZAAR) 50 MG tablet Take 1 tablet by mouth daily.     methylPREDNISolone (MEDROL DOSEPAK) 4 MG TBPK tablet Use as instructed 21 tablet 0   morphine (MS CONTIN) 30 MG 12 hr tablet Take 1 tablet (30 mg total) by mouth every 12 (twelve) hours. 60 tablet 0   Multiple Vitamins-Minerals (MULTIVITAMIN ADULT EXTRA C PO) Take by mouth. (Patient not taking: No sig reported)     oxyCODONE-acetaminophen  (PERCOCET/ROXICET) 5-325 MG tablet Take 1 tablet by mouth every 4 (four) hours as needed for severe pain. For breakthru pain 30 tablet 0   potassium chloride (KLOR-CON) 10 MEQ tablet Klor-Con M10 mEq tablet,extended release (Patient not taking: No sig reported)     prochlorperazine (COMPAZINE) 10 MG tablet Take 1 tablet (10 mg total) by mouth every 6 (six) hours as needed for nausea or vomiting. (Patient not taking: Reported on 12/06/2020) 30 tablet 0   rosuvastatin (CRESTOR) 10 MG tablet Take 10 mg by mouth at bedtime.     No current facility-administered medications for this visit.    SURGICAL HISTORY: No past surgical history on file.  REVIEW OF SYSTEMS:  Constitutional: positive for anorexia, fatigue, and weight loss Eyes: negative Ears, nose, mouth, throat, and face: negative Respiratory: positive for dyspnea on exertion and pleurisy/chest pain Cardiovascular: negative Gastrointestinal: negative Genitourinary:negative Integument/breast: negative Hematologic/lymphatic: negative Musculoskeletal:positive for bone pain Neurological: negative Behavioral/Psych: negative Endocrine: negative Allergic/Immunologic: negative   PHYSICAL EXAMINATION: General appearance: alert, cooperative, fatigued, and no distress Head: Normocephalic, without obvious abnormality, atraumatic Neck: no adenopathy, no JVD, supple, symmetrical, trachea midline, and thyroid not enlarged, symmetric, no tenderness/mass/nodules Lymph nodes: Cervical, supraclavicular, and axillary nodes normal. Resp: clear to auscultation bilaterally Back: symmetric, no curvature. ROM normal. No CVA tenderness. Cardio: regular rate and rhythm, S1, S2 normal, no murmur, click, rub or gallop GI: soft, non-tender; bowel sounds normal; no masses,  no organomegaly Extremities: extremities normal, atraumatic, no cyanosis or edema Neurologic: Alert and oriented X 3, normal strength and tone. Normal symmetric reflexes. Normal coordination  and gait  ECOG PERFORMANCE STATUS: 1 - Symptomatic but completely ambulatory  Blood pressure (!) 102/41, pulse 72, temperature 97.7 F (36.5 C), temperature source Tympanic, resp. rate 18, height $RemoveBe'5\' 5"'sidrMAmsX$  (1.651 m), weight 101 lb 11.2 oz (46.1 kg), SpO2 94 %.  LABORATORY DATA: Lab Results  Component Value Date   WBC 13.9 (H) 12/15/2020   HGB 11.3 (L) 12/15/2020   HCT 34.2 (L) 12/15/2020   MCV 89.5 12/15/2020   PLT 291 12/15/2020      Chemistry      Component Value Date/Time   NA 136 11/30/2020 1342   K 4.3 11/30/2020 1342   CL 98 11/30/2020 1342   CO2 27 11/30/2020 1342   BUN 20 11/30/2020 1342   CREATININE 0.67 11/30/2020 1342      Component Value Date/Time   CALCIUM 9.5 11/30/2020 1342   ALKPHOS 74 11/30/2020 1342   AST 15 11/30/2020 1342   ALT 10 11/30/2020 1342   BILITOT 0.5 11/30/2020 1342       RADIOGRAPHIC STUDIES: No results found.  ASSESSMENT AND PLAN: This is a very pleasant 75 years old white female recently diagnosed with a stage IV (T3, N2, M1 C) non-small cell lung cancer with unknown histologic subtype questionable to be likely squamous cell carcinoma based on the morphology of the right upper lobe cavitary mass with mediastinal lymphadenopathy and metastatic  disease to the bones as well as the right adrenal gland diagnosed in June 2022 but adenocarcinoma cannot be completely ruled out especially with the KRAS G12C mutation.  The patient has actionable KRAS G12C mutation which will be used as an option for treatment on the second line setting. I discussed the molecular study with the patient and recommended for her to proceed with first-line systemic chemotherapy as planned but I may consider changing her chemotherapy to carboplatin for AUC of 5, Alimta 500 Mg/M2 and Keytruda 200 Mg IV every 3 weeks because of the high possibility of adenocarcinoma histology based on the the presence of KRAS G12C mutation. I will call her pharmacy with prescription for folic acid  1 mg p.o. daily.  I will also arrange for the patient to receive vitamin B12 injection before the first dose of her treatment. She will come back for follow-up visit for evaluation before starting the first cycle of her treatment. For the pain management, she will continue on MS Contin 30 mg p.o. every 12 hours in addition to [place] for breakthrough pain.  She will also continue with the palliative radiotherapy under the care of Dr. Sondra Come as planned. The patient was advised to call immediately if she has any other concerning symptoms in the interval.  The patient voices understanding of current disease status and treatment options and is in agreement with the current care plan.  All questions were answered. The patient knows to call the clinic with any problems, questions or concerns. We can certainly see the patient much sooner if necessary.  The total time spent in the appointment was 55 minutes.  Disclaimer: This note was dictated with voice recognition software. Similar sounding words can inadvertently be transcribed and may not be corrected upon review.

## 2020-12-16 ENCOUNTER — Inpatient Hospital Stay: Payer: Medicare HMO | Admitting: Nutrition

## 2020-12-16 ENCOUNTER — Other Ambulatory Visit: Payer: Self-pay

## 2020-12-16 ENCOUNTER — Ambulatory Visit
Admission: RE | Admit: 2020-12-16 | Discharge: 2020-12-16 | Disposition: A | Discharge status: Home / Self Care | Payer: Medicare HMO | Source: Ambulatory Visit | Attending: Radiation Oncology | Admitting: Radiation Oncology

## 2020-12-16 DIAGNOSIS — Z510 Encounter for antineoplastic radiation therapy: Secondary | ICD-10-CM | POA: Diagnosis not present

## 2020-12-16 DIAGNOSIS — C7951 Secondary malignant neoplasm of bone: Secondary | ICD-10-CM | POA: Diagnosis not present

## 2020-12-16 DIAGNOSIS — C7971 Secondary malignant neoplasm of right adrenal gland: Secondary | ICD-10-CM | POA: Diagnosis not present

## 2020-12-16 DIAGNOSIS — C3411 Malignant neoplasm of upper lobe, right bronchus or lung: Secondary | ICD-10-CM | POA: Diagnosis not present

## 2020-12-16 DIAGNOSIS — Z87891 Personal history of nicotine dependence: Secondary | ICD-10-CM | POA: Diagnosis not present

## 2020-12-16 NOTE — Progress Notes (Signed)
75 year old female diagnosed with stage IV non-small cell lung cancer followed by Dr. Julien Nordmann and Dr. Sondra Come.  Patient presents with caregiver.  Past medical history includes diabetes, dyslipidemia, hypertension, tobacco, and alcohol usage.  Medications include coenzyme every 10, vitamin D2, and Lexapro.  Labs include glucose 125, sodium 129, albumin 3.1 on July 27. Height: 65 inches. Weight: 101.7 pounds July 27. Usual body weight: 124-126 pounds per patient.  Patient weighed 119 pounds April 29, 2020. BMI: 16.81.  Patient concerned due to progressive weight loss.  She reports she has very little interest in food.  She reports taste alterations and dry mouth.  She has been alternating between constipation and soft stools.  She has been taking MiraLAX. She describes symptoms of nausea.  She has not been taking any nausea medication.  Nutrition diagnosis:  Unintended weight loss related to cancer and associated treatments as evidenced by 19% weight loss from usual body weight.  Patient has had 15% weight loss over 8 months which is significant.    Patient meets criteria for severe malnutrition in the context of chronic illness secondary to percentage of weight loss and severe depletion of body fat and muscle mass on physical exam.  Intervention: Educated patient to consume small amounts of food every 2 hours.  Educated on other strategies to improve appetite. Encouraged high-calorie, high-protein foods. Recommended patient DC Premier protein and consume Ensure complete twice daily.  Provided samples.  Encourage patient to make milkshakes to improve flavor. Educated on strategies to improve taste alterations and dry mouth. Educated on strategies for managing diarrhea/constipation. Encourage patient to take nausea medication as prescribed consistently over the next 3 to 4 days to see if this improves. Nutrition facts sheets provided.  Patient given multiple samples.  Questions were  answered.  Teach back method used.  Contact information given.  Monitoring, evaluation, goals: Patient will tolerate increased calories and protein to minimize further weight loss.  Next visit: Thursday, September 1 during infusion.  **Disclaimer: This note was dictated with voice recognition software. Similar sounding words can inadvertently be transcribed and this note may contain transcription errors which may not have been corrected upon publication of note.**

## 2020-12-17 ENCOUNTER — Telehealth: Payer: Self-pay | Admitting: General Practice

## 2020-12-17 ENCOUNTER — Ambulatory Visit
Admission: RE | Admit: 2020-12-17 | Discharge: 2020-12-17 | Disposition: A | Discharge status: Home / Self Care | Payer: Medicare HMO | Source: Ambulatory Visit | Attending: Radiation Oncology | Admitting: Radiation Oncology

## 2020-12-17 DIAGNOSIS — Z87891 Personal history of nicotine dependence: Secondary | ICD-10-CM | POA: Diagnosis not present

## 2020-12-17 DIAGNOSIS — Z510 Encounter for antineoplastic radiation therapy: Secondary | ICD-10-CM | POA: Diagnosis not present

## 2020-12-17 DIAGNOSIS — C3411 Malignant neoplasm of upper lobe, right bronchus or lung: Secondary | ICD-10-CM | POA: Diagnosis not present

## 2020-12-17 DIAGNOSIS — C7971 Secondary malignant neoplasm of right adrenal gland: Secondary | ICD-10-CM | POA: Diagnosis not present

## 2020-12-17 DIAGNOSIS — C7951 Secondary malignant neoplasm of bone: Secondary | ICD-10-CM | POA: Diagnosis not present

## 2020-12-17 NOTE — Telephone Encounter (Signed)
[place] CSW Progress Notes  Call to neighbor Trey Paula at request of dietitian B Neff.  Kristi Jennings has concerns about patient's level of anxiety and coping skills.  Confirms patient has no children and she has served as helper to patient and husband over the years.  Provides transportation, help w paperwork, errands and similar in a voluteer capacity.  Says patient was previously healthy, was capable and in charge of household management.  Change in health status has resulted in significant role change, both husband and patient are struggling to find their bearings as patients ability to care for herself has decreased.  Patient worries about husband's adjustment to her illness as he lost his first wife to cancer many years ago.  Per Kristi Jennings, patient is somewhat anxious in general - this current situation has been overwhelming to her.  Discussed some of the common reactions to diagnosis of serious illness like cancer and the need for patients to have control over as much of life as possible.  Discussed need to concentrate on one thing at a time - getting through upcoming radiation appointments, for example.  Discussed ways for neighbor to be quietly supportive of patient, encouraging her and husband to find their way through this challenge.  CSW team is available should patient want to talk w [place] - patient received a packet of information and our contact information at yesterday's visit with [place].  Edwyna Shell, LCSW Clinical Social Worker Phone:  [phone]

## 2020-12-20 ENCOUNTER — Ambulatory Visit
Admission: RE | Admit: 2020-12-20 | Discharge: 2020-12-20 | Disposition: A | Discharge status: Home / Self Care | Payer: Medicare HMO | Source: Ambulatory Visit | Attending: Radiation Oncology | Admitting: Radiation Oncology

## 2020-12-20 ENCOUNTER — Other Ambulatory Visit: Payer: Self-pay

## 2020-12-20 DIAGNOSIS — C7951 Secondary malignant neoplasm of bone: Secondary | ICD-10-CM | POA: Insufficient documentation

## 2020-12-20 DIAGNOSIS — Z510 Encounter for antineoplastic radiation therapy: Secondary | ICD-10-CM | POA: Diagnosis not present

## 2020-12-20 DIAGNOSIS — C3411 Malignant neoplasm of upper lobe, right bronchus or lung: Secondary | ICD-10-CM | POA: Diagnosis not present

## 2020-12-20 DIAGNOSIS — Z87891 Personal history of nicotine dependence: Secondary | ICD-10-CM | POA: Diagnosis not present

## 2020-12-20 NOTE — Progress Notes (Signed)
Patient presented for treatment to right lung complains of pain beneath left rib cage and constipation. Per Dr Sondra Come, advised patient to increase Miralax to once daily and increase frequency of Percocet up to one tablet every 4 hours to control pain. Patient refused xray at this time.  BP 102/67 O2 97% on room air Pulse 80

## 2020-12-21 ENCOUNTER — Ambulatory Visit: Payer: Medicare HMO | Admitting: Nutrition

## 2020-12-21 ENCOUNTER — Ambulatory Visit
Admission: RE | Admit: 2020-12-21 | Discharge: 2020-12-21 | Disposition: A | Discharge status: Home / Self Care | Payer: Medicare HMO | Source: Ambulatory Visit | Attending: Radiation Oncology | Admitting: Radiation Oncology

## 2020-12-21 DIAGNOSIS — C7951 Secondary malignant neoplasm of bone: Secondary | ICD-10-CM | POA: Diagnosis not present

## 2020-12-21 DIAGNOSIS — Z510 Encounter for antineoplastic radiation therapy: Secondary | ICD-10-CM | POA: Diagnosis not present

## 2020-12-21 DIAGNOSIS — Z87891 Personal history of nicotine dependence: Secondary | ICD-10-CM | POA: Diagnosis not present

## 2020-12-21 DIAGNOSIS — C3411 Malignant neoplasm of upper lobe, right bronchus or lung: Secondary | ICD-10-CM | POA: Diagnosis not present

## 2020-12-21 LAB — GUARDANT 360

## 2020-12-21 NOTE — Progress Notes (Signed)
Nutrition follow-up completed with patient after radiation therapy for stage IV non-small cell lung cancer. Weight decreased and documented as 100.4 pounds August 2 from 101.7 pounds July 27.  Patient continues to lose weight. Patient tried nausea medication however it did not increase desire to eat. She now reports she has constipation. Per RN she was told yesterday to discontinue nausea medicine and start MiraLAX daily.  Patient has been concerned regarding constipation evolving into diarrhea. She has not tried Ensure complete. She tries 1 bite of food and then refuses the rest.  Nutrition diagnosis: Unintended weight loss continues. Severe malnutrition continues.  Intervention: Discontinue nausea medication. Increase MiraLAX per RN. Increase fluids. Try Ensure complete to provide additional calories and protein to minimize further weight loss. Encourage patient to push oral intake. Consider appetite stimulant.  Monitoring, evaluation, goals: Patient will increase calories and protein to minimize further weight loss. Monitor plan of care  Next visit: To be scheduled as needed.  **Disclaimer: This note was dictated with voice recognition software. Similar sounding words can inadvertently be transcribed and this note may contain transcription errors which may not have been corrected upon publication of note.**

## 2020-12-22 ENCOUNTER — Other Ambulatory Visit: Payer: Self-pay

## 2020-12-22 ENCOUNTER — Ambulatory Visit: Payer: Medicare HMO

## 2020-12-22 ENCOUNTER — Ambulatory Visit
Admission: RE | Admit: 2020-12-22 | Discharge: 2020-12-22 | Disposition: A | Discharge status: Home / Self Care | Payer: Medicare HMO | Source: Ambulatory Visit | Attending: Radiation Oncology | Admitting: Radiation Oncology

## 2020-12-22 DIAGNOSIS — Z510 Encounter for antineoplastic radiation therapy: Secondary | ICD-10-CM | POA: Diagnosis not present

## 2020-12-22 DIAGNOSIS — C3411 Malignant neoplasm of upper lobe, right bronchus or lung: Secondary | ICD-10-CM | POA: Diagnosis not present

## 2020-12-22 DIAGNOSIS — Z87891 Personal history of nicotine dependence: Secondary | ICD-10-CM | POA: Diagnosis not present

## 2020-12-22 DIAGNOSIS — C3491 Malignant neoplasm of unspecified part of right bronchus or lung: Secondary | ICD-10-CM

## 2020-12-22 DIAGNOSIS — C7951 Secondary malignant neoplasm of bone: Secondary | ICD-10-CM | POA: Diagnosis not present

## 2020-12-23 ENCOUNTER — Ambulatory Visit
Admission: RE | Admit: 2020-12-23 | Discharge: 2020-12-23 | Disposition: A | Discharge status: Home / Self Care | Payer: Medicare HMO | Source: Ambulatory Visit | Attending: Radiation Oncology | Admitting: Radiation Oncology

## 2020-12-23 DIAGNOSIS — C3411 Malignant neoplasm of upper lobe, right bronchus or lung: Secondary | ICD-10-CM | POA: Diagnosis not present

## 2020-12-23 DIAGNOSIS — Z510 Encounter for antineoplastic radiation therapy: Secondary | ICD-10-CM | POA: Diagnosis not present

## 2020-12-23 DIAGNOSIS — Z87891 Personal history of nicotine dependence: Secondary | ICD-10-CM | POA: Diagnosis not present

## 2020-12-23 DIAGNOSIS — C7951 Secondary malignant neoplasm of bone: Secondary | ICD-10-CM | POA: Diagnosis not present

## 2020-12-24 ENCOUNTER — Encounter: Payer: Self-pay | Admitting: Radiation Oncology

## 2020-12-24 ENCOUNTER — Other Ambulatory Visit: Payer: Self-pay | Admitting: Medical Oncology

## 2020-12-24 ENCOUNTER — Inpatient Hospital Stay: Payer: Medicare HMO | Attending: Internal Medicine

## 2020-12-24 ENCOUNTER — Ambulatory Visit
Admission: RE | Admit: 2020-12-24 | Discharge: 2020-12-24 | Disposition: A | Discharge status: Home / Self Care | Payer: Medicare HMO | Source: Ambulatory Visit | Attending: Radiation Oncology | Admitting: Radiation Oncology

## 2020-12-24 ENCOUNTER — Other Ambulatory Visit: Payer: Self-pay

## 2020-12-24 DIAGNOSIS — Z510 Encounter for antineoplastic radiation therapy: Secondary | ICD-10-CM | POA: Diagnosis not present

## 2020-12-24 DIAGNOSIS — C7971 Secondary malignant neoplasm of right adrenal gland: Secondary | ICD-10-CM | POA: Diagnosis not present

## 2020-12-24 DIAGNOSIS — C7951 Secondary malignant neoplasm of bone: Secondary | ICD-10-CM | POA: Insufficient documentation

## 2020-12-24 DIAGNOSIS — C3491 Malignant neoplasm of unspecified part of right bronchus or lung: Secondary | ICD-10-CM

## 2020-12-24 DIAGNOSIS — Z7982 Long term (current) use of aspirin: Secondary | ICD-10-CM | POA: Diagnosis not present

## 2020-12-24 DIAGNOSIS — Z5112 Encounter for antineoplastic immunotherapy: Secondary | ICD-10-CM | POA: Insufficient documentation

## 2020-12-24 DIAGNOSIS — Z87891 Personal history of nicotine dependence: Secondary | ICD-10-CM | POA: Diagnosis not present

## 2020-12-24 DIAGNOSIS — Z79899 Other long term (current) drug therapy: Secondary | ICD-10-CM | POA: Diagnosis not present

## 2020-12-24 DIAGNOSIS — C3411 Malignant neoplasm of upper lobe, right bronchus or lung: Secondary | ICD-10-CM | POA: Diagnosis not present

## 2020-12-24 DIAGNOSIS — I10 Essential (primary) hypertension: Secondary | ICD-10-CM | POA: Diagnosis not present

## 2020-12-24 DIAGNOSIS — Z5111 Encounter for antineoplastic chemotherapy: Secondary | ICD-10-CM | POA: Insufficient documentation

## 2020-12-24 DIAGNOSIS — E785 Hyperlipidemia, unspecified: Secondary | ICD-10-CM | POA: Insufficient documentation

## 2020-12-24 DIAGNOSIS — E119 Type 2 diabetes mellitus without complications: Secondary | ICD-10-CM | POA: Diagnosis not present

## 2020-12-24 MED ORDER — CYANOCOBALAMIN 1000 MCG/ML IJ SOLN
1000.0000 ug | Freq: Once | INTRAMUSCULAR | Status: AC
  Administered 2020-12-24: 1000 ug via INTRAMUSCULAR

## 2020-12-24 MED ORDER — CYANOCOBALAMIN 1000 MCG/ML IJ SOLN
INTRAMUSCULAR | Status: AC
  Filled 2020-12-24: qty 1

## 2020-12-24 NOTE — Patient Instructions (Signed)
Vitamin B12 Injection What is this medication? Vitamin B12 (VAHY tuh min B12) prevents and treats low vitamin B12 levels in your body. It is used in people who do not get enough vitamin B12 from their diet or when their digestive tract does not absorb enough. Vitamin B12 plays an important role in maintaining the health of your nervous system and red bloodcells. This medicine may be used for other purposes; ask your health care provider orpharmacist if you have questions. COMMON BRAND NAME(S): B-12 Compliance Kit, B-12 Injection Kit, Cyomin, LA-12,Nutri-Twelve, Physicians EZ Use B-12, Primabalt What should I tell my care team before I take this medication? They need to know if you have any of these conditions: Kidney disease Leber's disease Megaloblastic anemia An unusual or allergic reaction to cyanocobalamin, cobalt, other medications, foods, dyes, or preservatives Pregnant or trying to get pregnant Breast-feeding How should I use this medication? This medication is injected into a muscle or deeply under the skin. It is usually given in a clinic or care team's office. However, your care team mayteach you how to inject yourself. Follow all instructions. Talk to your care team about the use of this medication in children. Specialcare may be needed. Overdosage: If you think you have taken too much of this medicine contact apoison control center or emergency room at once. NOTE: This medicine is only for you. Do not share this medicine with others. What if I miss a dose? If you are given your dose at a clinic or care team's office, call to reschedule your appointment. If you give your own injections, and you miss a dose, take it as soon as you can. If it is almost time for your next dose, takeonly that dose. Do not take double or extra doses. What may interact with this medication? Colchicine Heavy alcohol intake This list may not describe all possible interactions. Give your health care provider  a list of all the medicines, herbs, non-prescription drugs, or dietary supplements you use. Also tell them if you smoke, drink alcohol, or use illegaldrugs. Some items may interact with your medicine. What should I watch for while using this medication? Visit your care team regularly. You may need blood work done while you aretaking this medication. You may need to follow a special diet. Talk to your care team. Limit youralcohol intake and avoid smoking to get the best benefit. What side effects may I notice from receiving this medication? Side effects that you should report to your care team as soon as possible: Allergic reactions-skin rash, itching, hives, swelling of the face, lips, tongue, or throat Swelling of the ankles, hands, or feet Trouble breathing Side effects that usually do not require medical attention (report to your careteam if they continue or are bothersome): Diarrhea This list may not describe all possible side effects. Call your doctor for medical advice about side effects. You may report side effects to FDA at1-800-FDA-1088. Where should I keep my medication? Keep out of the reach of children. Store at room temperature between 15 and 30 degrees C (59 and 85 degrees F).Protect from light. Throw away any unused medication after the expiration date. NOTE: This sheet is a summary. It may not cover all possible information. If you have questions about this medicine, talk to your doctor, pharmacist, orhealth care provider.  2022 Elsevier/Gold Standard (2020-06-28 11:47:06)  

## 2020-12-26 ENCOUNTER — Emergency Department (HOSPITAL_BASED_OUTPATIENT_CLINIC_OR_DEPARTMENT_OTHER): Payer: Medicare HMO

## 2020-12-26 ENCOUNTER — Encounter: Payer: Self-pay | Admitting: Internal Medicine

## 2020-12-26 ENCOUNTER — Encounter (HOSPITAL_BASED_OUTPATIENT_CLINIC_OR_DEPARTMENT_OTHER): Payer: Self-pay | Admitting: Emergency Medicine

## 2020-12-26 ENCOUNTER — Other Ambulatory Visit: Payer: Self-pay

## 2020-12-26 ENCOUNTER — Emergency Department (HOSPITAL_BASED_OUTPATIENT_CLINIC_OR_DEPARTMENT_OTHER)
Admission: EM | Admit: 2020-12-26 | Discharge: 2020-12-26 | Disposition: A | Discharge status: Home / Self Care | Payer: Medicare HMO | Attending: Emergency Medicine | Admitting: Emergency Medicine

## 2020-12-26 DIAGNOSIS — Z7982 Long term (current) use of aspirin: Secondary | ICD-10-CM | POA: Diagnosis not present

## 2020-12-26 DIAGNOSIS — J90 Pleural effusion, not elsewhere classified: Secondary | ICD-10-CM | POA: Diagnosis not present

## 2020-12-26 DIAGNOSIS — I10 Essential (primary) hypertension: Secondary | ICD-10-CM | POA: Insufficient documentation

## 2020-12-26 DIAGNOSIS — Z85118 Personal history of other malignant neoplasm of bronchus and lung: Secondary | ICD-10-CM | POA: Diagnosis not present

## 2020-12-26 DIAGNOSIS — M79621 Pain in right upper arm: Secondary | ICD-10-CM | POA: Insufficient documentation

## 2020-12-26 DIAGNOSIS — R0602 Shortness of breath: Secondary | ICD-10-CM | POA: Diagnosis not present

## 2020-12-26 DIAGNOSIS — Z87891 Personal history of nicotine dependence: Secondary | ICD-10-CM | POA: Diagnosis not present

## 2020-12-26 DIAGNOSIS — M79601 Pain in right arm: Secondary | ICD-10-CM | POA: Diagnosis not present

## 2020-12-26 DIAGNOSIS — Z79899 Other long term (current) drug therapy: Secondary | ICD-10-CM | POA: Diagnosis not present

## 2020-12-26 MED ORDER — DICLOFENAC SODIUM 1 % EX GEL: 4.0000 g | Freq: Four times a day (QID) | CUTANEOUS | 0 refills | Status: DC

## 2020-12-26 NOTE — ED Provider Notes (Signed)
[facility] EMERGENCY DEPARTMENT Provider Note   CSN: [id number] Arrival date & time: 12/26/20  4098     History Chief Complaint  Patient presents with   Medication Reaction    Kristi Jennings is a 75 y.o. female.  Patient is a 75 year old female who presents with pain in her arm at injection site.  She is currently being treated for stage IV lung cancer.  She has some metastatic disease to her ribs.  She completed 2-week course of radiation treatments.  She is set to start chemotherapy next week.  She got a vitamin B12 injection on Friday into her right deltoid muscle.  She said that since then she has had some ongoing pain and achiness to her right arm and it radiates up into her right neck.  She had is noticed any swelling.  No fevers.  She does feel a little short of breath.  She had some ongoing shortness of breath with the cancer and she does not really see it feels any different.  She does not have any pleuritic pain.  No chest pain.  No leg swelling.  No cough or cold symptoms.  She uses MS Contin at home for pain control.  She does state that she took it this morning and her arm pain has eased off.  She is currently not short of breath.      Past Medical History:  Diagnosis Date   Borderline diabetes mellitus    Bronchogenic cancer of right lung ([place])    Dyslipidemia    Hypertension    Polymyalgia rheumatica ([place])     Patient Active Problem List   Diagnosis Date Noted   Non-small cell carcinoma of lung, stage 4, right ([place]) 11/30/2020   Encounter for antineoplastic chemotherapy 11/30/2020   Encounter for antineoplastic immunotherapy 11/30/2020   Cancer associated pain 11/30/2020   Malnutrition, calorie ([place]) 11/30/2020   Polymyalgia rheumatica ([place]) 08/12/2019   Allergic rhinitis 06/10/2015   Anxiety 06/10/2015   Benign essential hypertension 06/10/2015    History reviewed. No pertinent surgical history.   OB History   No obstetric history on file.      Family History  Problem Relation Age of Onset   Heart attack Mother    Heart disease Mother    Colon cancer Father    ALS Brother     Social History   Tobacco Use   Smoking status: Former    Packs/day: 1.00    Years: 43.00    Pack years: 43.00    Types: Cigarettes    Quit date: 11/23/2008    Years since quitting: 12.0   Smokeless tobacco: Never   Tobacco comments:    quit 2010  Vaping Use   Vaping Use: Never used  Substance Use Topics   Alcohol use: Not Currently   Drug use: Never    Home Medications Prior to Admission medications   Medication Sig Start Date End Date Taking? Authorizing Provider  diclofenac Sodium (VOLTAREN) 1 % GEL Apply 4 g topically 4 (four) times daily. 12/26/20  Yes Malvin Johns, MD  aspirin EC 81 MG tablet Take 81 mg by mouth 4 (four) times a week. Swallow whole.    [provider]  atenolol (TENORMIN) 25 MG tablet Take 25 mg by mouth daily. 07/22/20   [provider]  benzonatate (TESSALON) 200 MG capsule Take 1 capsule (200 mg total) by mouth every 8 (eight) hours. 09/29/20   Raylene Everts, MD  escitalopram (LEXAPRO) 10 MG  tablet Take 1 tablet by mouth at bedtime. 09/16/20   [provider]  HYDROcodone-acetaminophen (NORCO/VICODIN) 5-325 MG tablet hydrocodone 5 mg-acetaminophen 325 mg tablet  Take 1 tablet every 4-6 hours by oral route as needed for pain for 5 days.    [provider]  losartan (COZAAR) 50 MG tablet Take 1 tablet by mouth daily. 01/16/14   [provider]  methylPREDNISolone (MEDROL DOSEPAK) 4 MG TBPK tablet Use as instructed 11/30/20   Curt Bears, MD  morphine (MS CONTIN) 30 MG 12 hr tablet Take 1 tablet (30 mg total) by mouth every 12 (twelve) hours. 11/30/20   Curt Bears, MD  oxyCODONE-acetaminophen (PERCOCET/ROXICET) 5-325 MG tablet Take 1 tablet by mouth every 4 (four) hours as needed for severe pain. For breakthru pain 12/06/20   Gery Pray, MD  potassium chloride  (KLOR-CON) 10 MEQ tablet Klor-Con M10 mEq tablet,extended release    [provider]  prochlorperazine (COMPAZINE) 10 MG tablet Take 1 tablet (10 mg total) by mouth every 6 (six) hours as needed for nausea or vomiting. Patient not taking: Reported on 12/06/2020 11/30/20   Curt Bears, MD  rosuvastatin (CRESTOR) 10 MG tablet Take 10 mg by mouth at bedtime. 02/23/20   [provider]    Allergies    Shellfish-derived products and Augmentin [amoxicillin-pot clavulanate]  Review of Systems   Review of Systems  Constitutional:  Negative for chills, diaphoresis, fatigue and fever.  HENT:  Negative for congestion, rhinorrhea and sneezing.   Eyes: Negative.   Respiratory:  Positive for shortness of breath. Negative for cough and chest tightness.   Cardiovascular:  Negative for chest pain and leg swelling.  Gastrointestinal:  Negative for abdominal pain, blood in stool, diarrhea, nausea and vomiting.  Genitourinary:  Negative for difficulty urinating, flank pain, frequency and hematuria.  Musculoskeletal:  Positive for myalgias. Negative for arthralgias and back pain.  Skin:  Negative for rash.  Neurological:  Negative for dizziness, speech difficulty, weakness, numbness and headaches.   Physical Exam Updated Vital Signs BP (!) 95/53 (BP Location: Left Arm)   Pulse 66   Temp 98 F (36.7 C) (Oral)   Resp 20   Ht 5\' 5"  (1.651 m)   Wt 45.8 kg   SpO2 96%   BMI 16.81 kg/m   Physical Exam Constitutional:      Appearance: She is well-developed.  HENT:     Head: Normocephalic and atraumatic.  Eyes:     Pupils: Pupils are equal, round, and reactive to light.  Cardiovascular:     Rate and Rhythm: Normal rate and regular rhythm.     Heart sounds: Normal heart sounds.  Pulmonary:     Effort: Pulmonary effort is normal. No respiratory distress.     Breath sounds: Normal breath sounds. No wheezing or rales.  Chest:     Chest wall: No tenderness.  Abdominal:      General: Bowel sounds are normal.     Palpations: Abdomen is soft.     Tenderness: There is no abdominal tenderness. There is no guarding or rebound.  Musculoskeletal:        General: Normal range of motion.     Cervical back: Normal range of motion and neck supple.     Comments: OPatient has some soreness in the right deltoid area.  There is no swelling.  No redness.  Warmth.  No specific area of tenderness.  No pain on range of motion of the shoulder.  No pain to the shoulder  joint.  She has some mild tenderness over the right trapezius muscle.  She has normal sensation and motor function distally.  Radial pulses are intact.  No edema to lower extremities.  No calf tenderness.  Lymphadenopathy:     Cervical: No cervical adenopathy.  Skin:    General: Skin is warm and dry.     Findings: No rash.  Neurological:     Mental Status: She is alert and oriented to person, place, and time.    ED Results / Procedures / Treatments   Labs (all labs ordered are listed, but only abnormal results are displayed) Labs Reviewed - No data to display  EKG None  Radiology DG Chest 2 View  Result Date: 12/26/2020 CLINICAL DATA:  SOB EXAM: CHEST - 2 VIEW COMPARISON:  October 20, 2020. FINDINGS: Masslike opacity in the medial right upper lobe with mildly improved surrounding right upper lobe opacification volume loss. Left lung is clear. Probable small right pleural effusion. Lower thoracic vertebral body compression fracture with similar height loss to CT from October 21, 2018. Cardiac silhouette is within normal limits. IMPRESSION: 1. Masslike opacity in the medial right upper lobe with mildly improved surrounding right upper lobe opacification volume loss, compatible with known right upper lobe mass and postobstructive atelectasis and/or pneumonia. 2. Probable small right pleural effusion. 3. Lower thoracic vertebral body compression fracture with similar height loss to CT from October 21, 2018. Electronically Signed    By: Margaretha Sheffield MD   On: 12/26/2020 11:21    Procedures Procedures   Medications Ordered in ED Medications - No data to display  ED Course  I have reviewed the triage vital signs and the nursing notes.  Pertinent labs & imaging results that were available during my care of the patient were reviewed by me and considered in my medical decision making (see chart for details).    MDM Rules/Calculators/A&P                           Patient is a 75 year old female who presents with pain in her right upper arm after an injection of the B12 shot 2 days ago.  I do not see any swelling or suggestions of infection.  It may be some nerve irritation.  She is neurologically intact.  There is no swelling in the arm which would be concerning for DVT.  No cellulitis.  She reports some shortness of breath but feels like its not any different than her baseline shortness of breath since she was diagnosed with a cancer.  She does not have other symptoms that would be more concerning for PE.  There is no tachycardia or hypoxia.  She does not feel more short of breath than she has in the past.  Currently she does not feel short of breath at all.  She did have a chest x-ray which showed the mass with some possible atelectasis versus infiltrate but it seems to be improved from her prior x-ray.  She does not have other symptoms that would be more concerning for pneumonia.  She was discharged home in good condition.  She has an appointment to follow-up with Dr. Julien Nordmann this week.  She was given a prescription for Voltaren gel to use on the arm.  Return precautions were given. Final Clinical Impression(s) / ED Diagnoses Final diagnoses:  Pain of right upper extremity    Rx / DC Orders ED Discharge Orders  Ordered    diclofenac Sodium (VOLTAREN) 1 % GEL  4 times daily        12/26/20 1201             Malvin Johns, MD 12/26/20 1204

## 2020-12-26 NOTE — ED Triage Notes (Signed)
Family reports patient being treated for stage 4 lung cancer. Friday afternoon patient was given Vitamin B 12 shot afterwards patient has been exhausted and having shortness of breath. Patient also c/o right shoulder pain that continues after the shot and having tightness to right side of neck. Pt has had 2 weeks of chemo

## 2020-12-26 NOTE — ED Notes (Signed)
Applied heat pack to patients right deltoid

## 2020-12-26 NOTE — ED Notes (Signed)
Patient having pain in right shoulder at site of B-12 injection 2 days ago, shortness of breath is similar to what patent has been experiencing.  Injection site no obvious signs of infection, no redness or swelling.

## 2020-12-29 ENCOUNTER — Encounter: Payer: Self-pay | Admitting: *Deleted

## 2020-12-29 ENCOUNTER — Inpatient Hospital Stay: Payer: Medicare HMO

## 2020-12-29 ENCOUNTER — Inpatient Hospital Stay (HOSPITAL_BASED_OUTPATIENT_CLINIC_OR_DEPARTMENT_OTHER): Payer: Medicare HMO | Admitting: Internal Medicine

## 2020-12-29 ENCOUNTER — Encounter: Payer: Self-pay | Admitting: General Practice

## 2020-12-29 ENCOUNTER — Encounter: Payer: Self-pay | Admitting: Internal Medicine

## 2020-12-29 ENCOUNTER — Other Ambulatory Visit: Payer: Self-pay | Admitting: Physician Assistant

## 2020-12-29 ENCOUNTER — Other Ambulatory Visit: Payer: Self-pay

## 2020-12-29 VITALS — BP 114/59 | HR 80 | Temp 98.4°F | Resp 19 | Ht 65.0 in | Wt 97.2 lb

## 2020-12-29 DIAGNOSIS — C3491 Malignant neoplasm of unspecified part of right bronchus or lung: Secondary | ICD-10-CM

## 2020-12-29 DIAGNOSIS — G893 Neoplasm related pain (acute) (chronic): Secondary | ICD-10-CM

## 2020-12-29 DIAGNOSIS — M545 Low back pain, unspecified: Secondary | ICD-10-CM | POA: Diagnosis not present

## 2020-12-29 DIAGNOSIS — R69 Illness, unspecified: Secondary | ICD-10-CM | POA: Diagnosis not present

## 2020-12-29 DIAGNOSIS — S22080A Wedge compression fracture of T11-T12 vertebra, initial encounter for closed fracture: Secondary | ICD-10-CM | POA: Diagnosis not present

## 2020-12-29 DIAGNOSIS — R0602 Shortness of breath: Secondary | ICD-10-CM | POA: Diagnosis not present

## 2020-12-29 DIAGNOSIS — Z5112 Encounter for antineoplastic immunotherapy: Secondary | ICD-10-CM

## 2020-12-29 DIAGNOSIS — F419 Anxiety disorder, unspecified: Secondary | ICD-10-CM | POA: Diagnosis not present

## 2020-12-29 DIAGNOSIS — Z5111 Encounter for antineoplastic chemotherapy: Secondary | ICD-10-CM

## 2020-12-29 DIAGNOSIS — C3490 Malignant neoplasm of unspecified part of unspecified bronchus or lung: Secondary | ICD-10-CM | POA: Diagnosis not present

## 2020-12-29 DIAGNOSIS — J9601 Acute respiratory failure with hypoxia: Secondary | ICD-10-CM | POA: Diagnosis not present

## 2020-12-29 DIAGNOSIS — I2699 Other pulmonary embolism without acute cor pulmonale: Secondary | ICD-10-CM | POA: Diagnosis not present

## 2020-12-29 DIAGNOSIS — J90 Pleural effusion, not elsewhere classified: Secondary | ICD-10-CM | POA: Diagnosis not present

## 2020-12-29 LAB — CBC WITH DIFFERENTIAL (CANCER CENTER ONLY)
Abs Immature Granulocytes: 0.07 10*3/uL (ref 0.00–0.07)
Basophils Absolute: 0 10*3/uL (ref 0.0–0.1)
Basophils Relative: 0 %
Eosinophils Absolute: 0.3 10*3/uL (ref 0.0–0.5)
Eosinophils Relative: 2 %
HCT: 33.1 % — ABNORMAL LOW (ref 36.0–46.0)
Hemoglobin: 11.2 g/dL — ABNORMAL LOW (ref 12.0–15.0)
Immature Granulocytes: 1 %
Lymphocytes Relative: 6 %
Lymphs Abs: 0.6 10*3/uL — ABNORMAL LOW (ref 0.7–4.0)
MCH: 28.6 pg (ref 26.0–34.0)
MCHC: 33.8 g/dL (ref 30.0–36.0)
MCV: 84.7 fL (ref 80.0–100.0)
Monocytes Absolute: 1.4 10*3/uL — ABNORMAL HIGH (ref 0.1–1.0)
Monocytes Relative: 13 %
Neutro Abs: 8.6 10*3/uL — ABNORMAL HIGH (ref 1.7–7.7)
Neutrophils Relative %: 78 %
Platelet Count: 309 10*3/uL (ref 150–400)
RBC: 3.91 MIL/uL (ref 3.87–5.11)
RDW: 14 % (ref 11.5–15.5)
WBC Count: 10.9 10*3/uL — ABNORMAL HIGH (ref 4.0–10.5)
nRBC: 0 % (ref 0.0–0.2)

## 2020-12-29 LAB — CMP (CANCER CENTER ONLY)
ALT: 14 U/L (ref 0–44)
AST: 18 U/L (ref 15–41)
Albumin: 2.8 g/dL — ABNORMAL LOW (ref 3.5–5.0)
Alkaline Phosphatase: 107 U/L (ref 38–126)
Anion gap: 11 (ref 5–15)
BUN: 19 mg/dL (ref 8–23)
CO2: 24 mmol/L (ref 22–32)
Calcium: 9 mg/dL (ref 8.9–10.3)
Chloride: 95 mmol/L — ABNORMAL LOW (ref 98–111)
Creatinine: 0.55 mg/dL (ref 0.44–1.00)
GFR, Estimated: >60 mL/min (ref >60)
Glucose, Bld: 130 mg/dL — ABNORMAL HIGH (ref 70–99)
Potassium: 3.8 mmol/L (ref 3.5–5.1)
Sodium: 130 mmol/L — ABNORMAL LOW (ref 135–145)
Total Bilirubin: 0.5 mg/dL (ref 0.3–1.2)
Total Protein: 6.4 g/dL — ABNORMAL LOW (ref 6.5–8.1)

## 2020-12-29 LAB — TSH: TSH: 1.066 u[IU]/mL (ref 0.308–3.960)

## 2020-12-29 MED ORDER — FOLIC ACID 1 MG PO TABS: 1.0000 mg | ORAL_TABLET | Freq: Every day | ORAL | 2 refills | Status: DC

## 2020-12-29 MED ORDER — PALONOSETRON HCL INJECTION 0.25 MG/5ML
0.2500 mg | Freq: Once | INTRAVENOUS | Status: AC
  Administered 2020-12-29: 0.25 mg via INTRAVENOUS

## 2020-12-29 MED ORDER — SODIUM CHLORIDE 0.9 % IV SOLN
Freq: Once | INTRAVENOUS | Status: AC
Start: 2020-12-29 — End: 2020-12-29
  Filled 2020-12-29: qty 250

## 2020-12-29 MED ORDER — SODIUM CHLORIDE 0.9 % IV SOLN
500.0000 mg/m2 | Freq: Once | INTRAVENOUS | Status: AC
  Administered 2020-12-29: 700 mg via INTRAVENOUS
  Filled 2020-12-29: qty 20

## 2020-12-29 MED ORDER — SODIUM CHLORIDE 0.9 % IV SOLN
200.0000 mg | Freq: Once | INTRAVENOUS | Status: AC
  Administered 2020-12-29: 200 mg via INTRAVENOUS
  Filled 2020-12-29: qty 8

## 2020-12-29 MED ORDER — SODIUM CHLORIDE 0.9 % IV SOLN
300.0000 mg | Freq: Once | INTRAVENOUS | Status: AC
  Administered 2020-12-29: 300 mg via INTRAVENOUS
  Filled 2020-12-29: qty 30

## 2020-12-29 MED ORDER — MEGESTROL ACETATE 625 MG/5ML PO SUSP: 625.0000 mg | Freq: Every day | ORAL | 0 refills | Status: DC

## 2020-12-29 MED ORDER — SODIUM CHLORIDE 0.9 % IV SOLN
150.0000 mg | Freq: Once | INTRAVENOUS | Status: AC
  Administered 2020-12-29: 150 mg via INTRAVENOUS
  Filled 2020-12-29: qty 150

## 2020-12-29 MED ORDER — SODIUM CHLORIDE 0.9 % IV SOLN
10.0000 mg | Freq: Once | INTRAVENOUS | Status: AC
  Administered 2020-12-29: 10 mg via INTRAVENOUS
  Filled 2020-12-29: qty 10

## 2020-12-29 MED ORDER — PALONOSETRON HCL INJECTION 0.25 MG/5ML
INTRAVENOUS | Status: AC
  Filled 2020-12-29: qty 5

## 2020-12-29 NOTE — Progress Notes (Signed)
[facility] Spiritual Care Note  Met Ms Cunnington's husband and neighbor this morning, introducing Spiritual Care as part of their support team. Met Ms Birdwell with Vernie Shanks [facility]/LCSW after infusion, bringing prayer shawl as a tangible sign of support and encouragement. Family is very Therapist, nutritional Care support team and welcomes follow-up; plan to phone Ms Blahnik next week to speak in more detail about needs and resources. Hope to be able to follow her through [facility] as well.   Sisters, [place], [facility] Pager [phone] Voicemail [phone]

## 2020-12-29 NOTE — Progress Notes (Signed)
[facility] [phone]   [phone]  OFFICE PROGRESS NOTE  Kristi Jennings., MD [address] [place] [address]  DIAGNOSIS:  Stage IV (T3, N2, M1 C) non-small cell lung cancer with unknown histologic subtype.  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  DETECTED ALTERATION(S) / BIOMARKER(S) % CFDNA OR AMPLIFICATION ASSOCIATED FDA-APPROVED THERAPIES CLINICAL TRIAL AVAILABILITY KRASG12C 8.9%  Sotorasib Yes [id number] 7.2% None  Yes  PRIOR THERAPY: Palliative radiotherapy to the painful bone lesions under the care of Dr. Sondra Come.  CURRENT THERAPY:  Palliative systemic chemotherapy with carboplatin for AUC of 5, Alimta 500 Mg/M2 and Keytruda 200 Mg IV every 3 weeks.  First dose of treatment on December 29, 2020.  INTERVAL HISTORY: Kristi Jennings 75 y.o. female returns to the clinic today for follow-up visit accompanied by her neighbor and caregiver Moshe Salisbury.  Her husband [name] was available by phone during the visit.  The patient is complaining of pain on the right shoulder area started after the vitamin B12 injection with radiation to the neck area.  She is currently on several pain medication including MS Contin as well as oxycodone, Voltaren and Penetrex gel.  She also take Tylenol 2 times a day and as needed.  She denied having any chest pain, shortness of breath, cough or hemoptysis.  She denied having any fever or chills.  She has no nausea, vomiting, diarrhea or constipation.  She is very depressed and she is currently on Lexapro by her gynecologist and will call them to increase the dose.  She also has lack of appetite and weight loss and she would like something to help increase her appetite.  She was seen by the dietitian at the cancer center recently.  The patient is here  today for evaluation before starting the first dose of her treatment with chemotherapy.  MEDICAL HISTORY: Past Medical History:  Diagnosis Date   Borderline diabetes mellitus    Bronchogenic cancer of right lung ([facility])    Dyslipidemia    Hypertension    Polymyalgia rheumatica ([facility])     ALLERGIES:  is allergic to shellfish-derived products and augmentin [amoxicillin-pot clavulanate].  MEDICATIONS:  Current Outpatient Medications  Medication Sig Dispense Refill   acetaminophen (TYLENOL) 500 MG tablet 1 tablet as needed     aspirin EC 81 MG tablet Take 81 mg by mouth 4 (four) times a week. Swallow whole.     atenolol (TENORMIN) 25 MG tablet Take 25 mg by mouth daily.     escitalopram (LEXAPRO) 10 MG tablet Take 1 tablet by mouth at bedtime.     losartan (COZAAR) 50 MG tablet Take 1 tablet by mouth daily.     morphine (MS CONTIN) 30 MG 12 hr tablet Take 1 tablet (30 mg total) by mouth every 12 (twelve) hours. 60 tablet 0   oxyCODONE-acetaminophen (PERCOCET/ROXICET) 5-325 MG tablet Take 1 tablet by mouth every 4 (four) hours as needed for severe pain. For breakthru pain 30 tablet 0   rosuvastatin (CRESTOR) 10 MG tablet Take 10 mg by mouth at bedtime.     benzonatate (TESSALON) 200 MG capsule Take 1 capsule (200 mg total) by mouth every 8 (eight) hours. (Patient not taking: Reported on 12/29/2020) 20 capsule 0   diclofenac Sodium (VOLTAREN) 1 % GEL Apply 4 g  topically 4 (four) times daily. (Patient not taking: Reported on 12/29/2020) 150 g 0   prochlorperazine (COMPAZINE) 10 MG tablet Take 1 tablet (10 mg total) by mouth every 6 (six) hours as needed for nausea or vomiting. (Patient not taking: No sig reported) 30 tablet 0   No current facility-administered medications for this visit.    SURGICAL HISTORY: History reviewed. No pertinent surgical history.  REVIEW OF SYSTEMS:  Constitutional: positive for anorexia, fatigue, and weight loss Eyes: negative Ears, nose, mouth, throat, and face:  negative Respiratory: positive for dyspnea on exertion Cardiovascular: negative Gastrointestinal: negative Genitourinary:negative Integument/breast: negative Hematologic/lymphatic: negative Musculoskeletal:positive for bone pain and neck pain Neurological: negative Behavioral/Psych: positive for anxiety and depression Endocrine: negative Allergic/Immunologic: negative   PHYSICAL EXAMINATION: General appearance: alert, cooperative, fatigued, and no distress Head: Normocephalic, without obvious abnormality, atraumatic Neck: no adenopathy, no JVD, supple, symmetrical, trachea midline, and thyroid not enlarged, symmetric, no tenderness/mass/nodules Lymph nodes: Cervical, supraclavicular, and axillary nodes normal. Resp: clear to auscultation bilaterally Back: symmetric, no curvature. ROM normal. No CVA tenderness. Cardio: regular rate and rhythm, S1, S2 normal, no murmur, click, rub or gallop GI: soft, non-tender; bowel sounds normal; no masses,  no organomegaly Extremities: extremities normal, atraumatic, no cyanosis or edema Neurologic: Alert and oriented X 3, normal strength and tone. Normal symmetric reflexes. Normal coordination and gait  ECOG PERFORMANCE STATUS: 1 - Symptomatic but completely ambulatory  Blood pressure (!) 114/59, pulse 80, temperature 98.4 F (36.9 C), temperature source Tympanic, resp. rate 19, height $RemoveBe'5\' 5"'[id number]$  (1.651 m), weight 97 lb 3.2 oz (44.1 kg), SpO2 95 %.  LABORATORY DATA: Lab Results  Component Value Date   WBC 10.9 (H) 12/29/2020   HGB 11.2 (L) 12/29/2020   HCT 33.1 (L) 12/29/2020   MCV 84.7 12/29/2020   PLT 309 12/29/2020      Chemistry      Component Value Date/Time   NA 129 (L) 12/15/2020 1126   K 4.4 12/15/2020 1126   CL 93 (L) 12/15/2020 1126   CO2 27 12/15/2020 1126   BUN 14 12/15/2020 1126   CREATININE 0.62 12/15/2020 1126      Component Value Date/Time   CALCIUM 9.0 12/15/2020 1126   ALKPHOS 86 12/15/2020 1126   AST 17 12/15/2020  1126   ALT 10 12/15/2020 1126   BILITOT 0.6 12/15/2020 1126       RADIOGRAPHIC STUDIES: DG Chest 2 View  Result Date: 12/26/2020 CLINICAL DATA:  SOB EXAM: CHEST - 2 VIEW COMPARISON:  October 20, 2020. FINDINGS: Masslike opacity in the medial right upper lobe with mildly improved surrounding right upper lobe opacification volume loss. Left lung is clear. Probable small right pleural effusion. Lower thoracic vertebral body compression fracture with similar height loss to CT from October 21, 2018. Cardiac silhouette is within normal limits. IMPRESSION: 1. Masslike opacity in the medial right upper lobe with mildly improved surrounding right upper lobe opacification volume loss, compatible with known right upper lobe mass and postobstructive atelectasis and/or pneumonia. 2. Probable small right pleural effusion. 3. Lower thoracic vertebral body compression fracture with similar height loss to CT from October 21, 2018. Electronically Signed   By: Margaretha Sheffield MD   On: 12/26/2020 11:21    ASSESSMENT AND PLAN: This is a very pleasant 75 years old white female recently diagnosed with a stage IV (T3, N2, M1c) non-small cell lung cancer with unknown histologic subtype questionable to be likely squamous cell carcinoma based on the morphology of the right upper lobe  cavitary mass with mediastinal lymphadenopathy and metastatic disease to the bones as well as the right adrenal gland diagnosed in June 2022 but adenocarcinoma cannot be completely ruled out especially with the KRAS G12C mutation.  The patient has actionable KRAS G12C mutation which will be used as an option for treatment on the second line setting. She is here today to start the first cycle of systemic chemotherapy with carboplatin for AUC of 5, Alimta 500 Mg/M2 and Keytruda 200 Mg IV every 3 weeks. The patient underwent palliative radiotherapy to multiple metastatic bone lesions under the care of Dr. Sondra Come.  She is now complaining of pain and the neck area  with radiation to the right shoulder. I will order MRI of the cervical spine to rule out any metastatic disease in that area.  She also noticed some subcutaneous nodules that likely metastatic disease in the neck area. For pain management she will continue her current treatment with MS Contin 30 mg p.o. every 12 hours in addition to Rafael Gonzalez for breakthrough pain as well as Tylenol and Voltaren gel. For the lack of appetite and weight loss, I will start the patient on Megace ES 625 mg p.o. daily.  The patient was advised about the risk of Deep venous thrombosis with this treatment. For the depression, she will call her gynecologist to increase the dose of Lexapro that was prescribed by her. I will see the patient back for follow-up visit in 3 weeks for evaluation before starting cycle #2. The patient was advised to call immediately if she has any other concerning symptoms in the interval.  The patient voices understanding of current disease status and treatment options and is in agreement with the current care plan.  All questions were answered. The patient knows to call the clinic with any problems, questions or concerns. We can certainly see the patient much sooner if necessary.  The total time spent in the appointment was 40 minutes.  Disclaimer: This note was dictated with voice recognition software. Similar sounding words can inadvertently be transcribed and may not be corrected upon review.

## 2020-12-29 NOTE — Progress Notes (Signed)
[employer]  Holiday representative and [employer] met with patient following 1st chemo treatment to offer support and resources.  CSW provided patient with information on the Lung support group and encouraged her to attend.  CSW and patient discussed the importance of support and support services available at [facility].  CSW will continue to be available to provided support as needed.  Patient has CSW contact information.  CSW encouraged patient to call with needs or concerns.   Johnnye Lana, MSW, LCSW, OSW-C Clinical Social Worker [facility] [phone]

## 2020-12-29 NOTE — Patient Instructions (Signed)
[facility] ONCOLOGY  Discharge Instructions: Thank you for choosing [facility] to provide your oncology and hematology care.   If you have a lab appointment with the [facility], please go directly to the [facility] and check in at the registration area.   Wear comfortable clothing and clothing appropriate for easy access to any Portacath or PICC line.   We strive to give you quality time with your provider. You may need to reschedule your appointment if you arrive late (15 or more minutes).  Arriving late affects you and other patients whose appointments are after yours.  Also, if you miss three or more appointments without notifying the office, you may be dismissed from the clinic at the provider's discretion.      For prescription refill requests, have your pharmacy contact our office and allow 72 hours for refills to be completed.    Today you received the following chemotherapy and/or immunotherapy agents: Keytruda, Alimta, Carboplatin    To help prevent nausea and vomiting after your treatment, we encourage you to take your nausea medication as directed.  BELOW ARE SYMPTOMS THAT SHOULD BE REPORTED IMMEDIATELY: *FEVER GREATER THAN 100.4 F (38 C) OR HIGHER *CHILLS OR SWEATING *NAUSEA AND VOMITING THAT IS NOT CONTROLLED WITH YOUR NAUSEA MEDICATION *UNUSUAL SHORTNESS OF BREATH *UNUSUAL BRUISING OR BLEEDING *URINARY PROBLEMS (pain or burning when urinating, or frequent urination) *BOWEL PROBLEMS (unusual diarrhea, constipation, pain near the anus) TENDERNESS IN MOUTH AND THROAT WITH OR WITHOUT PRESENCE OF ULCERS (sore throat, sores in mouth, or a toothache) UNUSUAL RASH, SWELLING OR PAIN  UNUSUAL VAGINAL DISCHARGE OR ITCHING   Items with * indicate a potential emergency and should be followed up as soon as possible or go to the Emergency Department if any problems should occur.  Please show the CHEMOTHERAPY ALERT CARD or IMMUNOTHERAPY ALERT  CARD at check-in to the Emergency Department and triage nurse.  Should you have questions after your visit or need to cancel or reschedule your appointment, please contact [facility]  Dept: [phone]  and follow the prompts.  Office hours are 8:00 a.m. to 4:30 p.m. Monday - Friday. Please note that voicemails left after 4:00 p.m. may not be returned until the following business day.  We are closed weekends and major holidays. You have access to a nurse at all times for urgent questions. Please call the main number to the clinic Dept: [phone] and follow the prompts.   For any non-urgent questions, you may also contact your provider using MyChart. We now offer e-Visits for anyone 7 and older to request care online for non-urgent symptoms. For details visit [email or web address].   Also download the MyChart app! Go to the app store, search "MyChart", open the app, select [facility], and log in with your MyChart username and password.  Due to Covid, a mask is required upon entering the hospital/clinic. If you do not have a mask, one will be given to you upon arrival. For doctor visits, patients may have 1 support person aged 77 or older with them. For treatment visits, patients cannot have anyone with them due to current Covid guidelines and our immunocompromised population.

## 2020-12-30 ENCOUNTER — Telehealth: Payer: Self-pay | Admitting: Medical Oncology

## 2020-12-30 ENCOUNTER — Other Ambulatory Visit: Payer: Self-pay | Admitting: Internal Medicine

## 2020-12-30 ENCOUNTER — Telehealth: Payer: Self-pay | Admitting: Radiology

## 2020-12-30 ENCOUNTER — Other Ambulatory Visit: Payer: Self-pay | Admitting: Urology

## 2020-12-30 MED ORDER — MORPHINE SULFATE ER 30 MG PO TBCR: 30.0000 mg | EXTENDED_RELEASE_TABLET | Freq: Two times a day (BID) | ORAL | 0 refills | Status: DC

## 2020-12-30 MED ORDER — OXYCODONE-ACETAMINOPHEN 5-325 MG PO TABS: 1.0000 | ORAL_TABLET | ORAL | 0 refills | Status: DC | PRN

## 2020-12-30 NOTE — Telephone Encounter (Signed)
Cara requests refill of percocet for patient.

## 2020-12-30 NOTE — Telephone Encounter (Signed)
Pt has 1 MS contin left for tonight .  Please send refill to [facility] asap -

## 2020-12-30 NOTE — Telephone Encounter (Signed)
Husband at pharmacy requesting refill on MS contin.

## 2020-12-31 ENCOUNTER — Ambulatory Visit: Payer: Medicare HMO

## 2020-12-31 ENCOUNTER — Encounter: Payer: Self-pay | Admitting: *Deleted

## 2020-12-31 NOTE — Progress Notes (Signed)
I received a message from [facility] that they needed more clinical information to obtain insurance auth for testing.  I faxed with confirmation fax received.

## 2021-01-01 ENCOUNTER — Emergency Department (HOSPITAL_COMMUNITY): Payer: Medicare HMO

## 2021-01-01 ENCOUNTER — Encounter (HOSPITAL_COMMUNITY): Payer: Self-pay | Admitting: Emergency Medicine

## 2021-01-01 ENCOUNTER — Inpatient Hospital Stay (HOSPITAL_COMMUNITY)
Admission: EM | Admit: 2021-01-01 | Discharge: 2021-01-06 | DRG: 175 | Disposition: A | Discharge status: Home Health | Payer: Medicare HMO | Attending: Family Medicine | Admitting: Family Medicine

## 2021-01-01 ENCOUNTER — Other Ambulatory Visit: Payer: Self-pay

## 2021-01-01 DIAGNOSIS — J90 Pleural effusion, not elsewhere classified: Secondary | ICD-10-CM | POA: Diagnosis not present

## 2021-01-01 DIAGNOSIS — Z20822 Contact with and (suspected) exposure to covid-19: Secondary | ICD-10-CM | POA: Diagnosis present

## 2021-01-01 DIAGNOSIS — M545 Low back pain, unspecified: Secondary | ICD-10-CM | POA: Diagnosis not present

## 2021-01-01 DIAGNOSIS — Z66 Do not resuscitate: Secondary | ICD-10-CM | POA: Diagnosis not present

## 2021-01-01 DIAGNOSIS — R0602 Shortness of breath: Secondary | ICD-10-CM | POA: Diagnosis not present

## 2021-01-01 DIAGNOSIS — R5383 Other fatigue: Secondary | ICD-10-CM

## 2021-01-01 DIAGNOSIS — M4854XA Collapsed vertebra, not elsewhere classified, thoracic region, initial encounter for fracture: Secondary | ICD-10-CM | POA: Diagnosis present

## 2021-01-01 DIAGNOSIS — C3491 Malignant neoplasm of unspecified part of right bronchus or lung: Secondary | ICD-10-CM | POA: Diagnosis present

## 2021-01-01 DIAGNOSIS — J189 Pneumonia, unspecified organism: Secondary | ICD-10-CM | POA: Diagnosis not present

## 2021-01-01 DIAGNOSIS — E876 Hypokalemia: Secondary | ICD-10-CM | POA: Diagnosis not present

## 2021-01-01 DIAGNOSIS — Z79899 Other long term (current) drug therapy: Secondary | ICD-10-CM

## 2021-01-01 DIAGNOSIS — K769 Liver disease, unspecified: Secondary | ICD-10-CM

## 2021-01-01 DIAGNOSIS — Z515 Encounter for palliative care: Secondary | ICD-10-CM

## 2021-01-01 DIAGNOSIS — R590 Localized enlarged lymph nodes: Secondary | ICD-10-CM | POA: Diagnosis present

## 2021-01-01 DIAGNOSIS — E860 Dehydration: Secondary | ICD-10-CM | POA: Diagnosis present

## 2021-01-01 DIAGNOSIS — F419 Anxiety disorder, unspecified: Secondary | ICD-10-CM | POA: Diagnosis present

## 2021-01-01 DIAGNOSIS — Z880 Allergy status to penicillin: Secondary | ICD-10-CM

## 2021-01-01 DIAGNOSIS — M5416 Radiculopathy, lumbar region: Secondary | ICD-10-CM

## 2021-01-01 DIAGNOSIS — M5117 Intervertebral disc disorders with radiculopathy, lumbosacral region: Secondary | ICD-10-CM | POA: Diagnosis present

## 2021-01-01 DIAGNOSIS — R5381 Other malaise: Secondary | ICD-10-CM | POA: Diagnosis not present

## 2021-01-01 DIAGNOSIS — S22080A Wedge compression fracture of T11-T12 vertebra, initial encounter for closed fracture: Secondary | ICD-10-CM

## 2021-01-01 DIAGNOSIS — S22000A Wedge compression fracture of unspecified thoracic vertebra, initial encounter for closed fracture: Secondary | ICD-10-CM

## 2021-01-01 DIAGNOSIS — M353 Polymyalgia rheumatica: Secondary | ICD-10-CM | POA: Diagnosis not present

## 2021-01-01 DIAGNOSIS — M4802 Spinal stenosis, cervical region: Secondary | ICD-10-CM | POA: Diagnosis not present

## 2021-01-01 DIAGNOSIS — M48061 Spinal stenosis, lumbar region without neurogenic claudication: Secondary | ICD-10-CM | POA: Diagnosis present

## 2021-01-01 DIAGNOSIS — Z87891 Personal history of nicotine dependence: Secondary | ICD-10-CM

## 2021-01-01 DIAGNOSIS — G893 Neoplasm related pain (acute) (chronic): Secondary | ICD-10-CM | POA: Diagnosis present

## 2021-01-01 DIAGNOSIS — C3490 Malignant neoplasm of unspecified part of unspecified bronchus or lung: Secondary | ICD-10-CM | POA: Diagnosis not present

## 2021-01-01 DIAGNOSIS — F32A Depression, unspecified: Secondary | ICD-10-CM | POA: Diagnosis present

## 2021-01-01 DIAGNOSIS — R64 Cachexia: Secondary | ICD-10-CM | POA: Diagnosis not present

## 2021-01-01 DIAGNOSIS — I2699 Other pulmonary embolism without acute cor pulmonale: Secondary | ICD-10-CM | POA: Diagnosis not present

## 2021-01-01 DIAGNOSIS — E785 Hyperlipidemia, unspecified: Secondary | ICD-10-CM | POA: Diagnosis present

## 2021-01-01 DIAGNOSIS — M2578 Osteophyte, vertebrae: Secondary | ICD-10-CM | POA: Diagnosis not present

## 2021-01-01 DIAGNOSIS — M25511 Pain in right shoulder: Secondary | ICD-10-CM

## 2021-01-01 DIAGNOSIS — Z8249 Family history of ischemic heart disease and other diseases of the circulatory system: Secondary | ICD-10-CM

## 2021-01-01 DIAGNOSIS — G9520 Unspecified cord compression: Secondary | ICD-10-CM | POA: Diagnosis not present

## 2021-01-01 DIAGNOSIS — Z85118 Personal history of other malignant neoplasm of bronchus and lung: Secondary | ICD-10-CM

## 2021-01-01 DIAGNOSIS — C7951 Secondary malignant neoplasm of bone: Secondary | ICD-10-CM | POA: Diagnosis not present

## 2021-01-01 DIAGNOSIS — Z681 Body mass index (BMI) 19 or less, adult: Secondary | ICD-10-CM

## 2021-01-01 DIAGNOSIS — C7971 Secondary malignant neoplasm of right adrenal gland: Secondary | ICD-10-CM | POA: Diagnosis not present

## 2021-01-01 DIAGNOSIS — M899 Disorder of bone, unspecified: Secondary | ICD-10-CM

## 2021-01-01 DIAGNOSIS — J309 Allergic rhinitis, unspecified: Secondary | ICD-10-CM | POA: Diagnosis present

## 2021-01-01 DIAGNOSIS — E871 Hypo-osmolality and hyponatremia: Secondary | ICD-10-CM | POA: Diagnosis present

## 2021-01-01 DIAGNOSIS — M5116 Intervertebral disc disorders with radiculopathy, lumbar region: Secondary | ICD-10-CM | POA: Diagnosis present

## 2021-01-01 DIAGNOSIS — M50223 Other cervical disc displacement at C6-C7 level: Secondary | ICD-10-CM | POA: Diagnosis not present

## 2021-01-01 DIAGNOSIS — I10 Essential (primary) hypertension: Secondary | ICD-10-CM | POA: Diagnosis present

## 2021-01-01 DIAGNOSIS — J9601 Acute respiratory failure with hypoxia: Secondary | ICD-10-CM | POA: Diagnosis present

## 2021-01-01 DIAGNOSIS — Z91013 Allergy to seafood: Secondary | ICD-10-CM

## 2021-01-01 DIAGNOSIS — Z7982 Long term (current) use of aspirin: Secondary | ICD-10-CM

## 2021-01-01 DIAGNOSIS — R778 Other specified abnormalities of plasma proteins: Secondary | ICD-10-CM

## 2021-01-01 LAB — HEPATIC FUNCTION PANEL
ALT: 20 U/L (ref 0–44)
AST: 27 U/L (ref 15–41)
Albumin: 3.2 g/dL — ABNORMAL LOW (ref 3.5–5.0)
Alkaline Phosphatase: 84 U/L (ref 38–126)
Bilirubin, Direct: 0.2 mg/dL (ref 0.0–0.2)
Indirect Bilirubin: 0.4 mg/dL (ref 0.3–0.9)
Total Bilirubin: 0.6 mg/dL (ref 0.3–1.2)
Total Protein: 6.8 g/dL (ref 6.5–8.1)

## 2021-01-01 LAB — BASIC METABOLIC PANEL
Anion gap: 12 (ref 5–15)
BUN: 14 mg/dL (ref 8–23)
CO2: 28 mmol/L (ref 22–32)
Calcium: 8.8 mg/dL — ABNORMAL LOW (ref 8.9–10.3)
Chloride: 91 mmol/L — ABNORMAL LOW (ref 98–111)
Creatinine, Ser: 0.4 mg/dL — ABNORMAL LOW (ref 0.44–1.00)
GFR, Estimated: >60 mL/min (ref >60)
Glucose, Bld: 104 mg/dL — ABNORMAL HIGH (ref 70–99)
Potassium: 3.5 mmol/L (ref 3.5–5.1)
Sodium: 131 mmol/L — ABNORMAL LOW (ref 135–145)

## 2021-01-01 LAB — CBC WITH DIFFERENTIAL/PLATELET
Abs Immature Granulocytes: 0.06 10*3/uL (ref 0.00–0.07)
Basophils Absolute: 0 10*3/uL (ref 0.0–0.1)
Basophils Relative: 0 %
Eosinophils Absolute: 0.3 10*3/uL (ref 0.0–0.5)
Eosinophils Relative: 3 %
HCT: 38.2 % (ref 36.0–46.0)
Hemoglobin: 12 g/dL (ref 12.0–15.0)
Immature Granulocytes: 1 %
Lymphocytes Relative: 5 %
Lymphs Abs: 0.6 10*3/uL — ABNORMAL LOW (ref 0.7–4.0)
MCH: 28 pg (ref 26.0–34.0)
MCHC: 31.4 g/dL (ref 30.0–36.0)
MCV: 89 fL (ref 80.0–100.0)
Monocytes Absolute: 0.2 10*3/uL (ref 0.1–1.0)
Monocytes Relative: 2 %
Neutro Abs: 9.6 10*3/uL — ABNORMAL HIGH (ref 1.7–7.7)
Neutrophils Relative %: 89 %
Platelets: 210 10*3/uL (ref 150–400)
RBC: 4.29 MIL/uL (ref 3.87–5.11)
RDW: 14.4 % (ref 11.5–15.5)
WBC: 10.8 10*3/uL — ABNORMAL HIGH (ref 4.0–10.5)
nRBC: 0 % (ref 0.0–0.2)

## 2021-01-01 LAB — TROPONIN I (HIGH SENSITIVITY)
Troponin I (High Sensitivity): 30 ng/L — ABNORMAL HIGH (ref <18)
Troponin I (High Sensitivity): 32 ng/L — ABNORMAL HIGH (ref <18)

## 2021-01-01 LAB — D-DIMER, QUANTITATIVE: D-Dimer, Quant: 3.74 ug/mL-FEU — ABNORMAL HIGH (ref 0.00–0.50)

## 2021-01-01 LAB — BRAIN NATRIURETIC PEPTIDE: B Natriuretic Peptide: 185.6 pg/mL — ABNORMAL HIGH (ref 0.0–100.0)

## 2021-01-01 LAB — LIPASE, BLOOD: Lipase: 22 U/L (ref 11–51)

## 2021-01-01 LAB — RESP PANEL BY RT-PCR (FLU A&B, COVID) ARPGX2
Influenza A by PCR: NEGATIVE
Influenza B by PCR: NEGATIVE
SARS Coronavirus 2 by RT PCR: NEGATIVE

## 2021-01-01 LAB — MAGNESIUM: Magnesium: 2 mg/dL (ref 1.7–2.4)

## 2021-01-01 MED ORDER — HEPARIN (PORCINE) 25000 UT/250ML-% IV SOLN
900.0000 [IU]/h | INTRAVENOUS | Status: DC
  Administered 2021-01-01: 750 [IU]/h via INTRAVENOUS
  Filled 2021-01-01: qty 250

## 2021-01-01 MED ORDER — ATENOLOL 25 MG PO TABS
12.5000 mg | ORAL_TABLET | Freq: Every day | ORAL | Status: DC
  Administered 2021-01-02 – 2021-01-05 (×5): 12.5 mg via ORAL
  Filled 2021-01-01 (×5): qty 1

## 2021-01-01 MED ORDER — FOLIC ACID 1 MG PO TABS
1.0000 mg | ORAL_TABLET | Freq: Every day | ORAL | Status: DC
  Administered 2021-01-02 – 2021-01-06 (×5): 1 mg via ORAL
  Filled 2021-01-01 (×5): qty 1

## 2021-01-01 MED ORDER — HYDROMORPHONE HCL 1 MG/ML IJ SOLN
0.5000 mg | INTRAMUSCULAR | Status: DC | PRN
  Administered 2021-01-01 – 2021-01-03 (×8): 1 mg via INTRAVENOUS
  Filled 2021-01-01 (×8): qty 1

## 2021-01-01 MED ORDER — ALBUTEROL SULFATE (2.5 MG/3ML) 0.083% IN NEBU: 2.5000 mg | INHALATION_SOLUTION | RESPIRATORY_TRACT | Status: DC | PRN

## 2021-01-01 MED ORDER — ESCITALOPRAM OXALATE 20 MG PO TABS
20.0000 mg | ORAL_TABLET | Freq: Every day | ORAL | Status: DC
  Administered 2021-01-02 – 2021-01-05 (×5): 20 mg via ORAL
  Filled 2021-01-01 (×5): qty 1

## 2021-01-01 MED ORDER — ONDANSETRON HCL 4 MG/2ML IJ SOLN
4.0000 mg | Freq: Once | INTRAMUSCULAR | Status: AC
  Administered 2021-01-01: 4 mg via INTRAVENOUS
  Filled 2021-01-01: qty 2

## 2021-01-01 MED ORDER — DICLOFENAC SODIUM 1 % EX GEL
4.0000 g | Freq: Four times a day (QID) | CUTANEOUS | Status: DC
  Administered 2021-01-02 – 2021-01-06 (×14): 4 g via TOPICAL
  Filled 2021-01-01: qty 100

## 2021-01-01 MED ORDER — SODIUM CHLORIDE 0.9 % IV BOLUS
500.0000 mL | Freq: Once | INTRAVENOUS | Status: AC
  Administered 2021-01-01: 500 mL via INTRAVENOUS

## 2021-01-01 MED ORDER — ONDANSETRON 4 MG PO TBDP: 4.0000 mg | ORAL_TABLET | Freq: Two times a day (BID) | ORAL | Status: DC | PRN

## 2021-01-01 MED ORDER — PROCHLORPERAZINE MALEATE 10 MG PO TABS
10.0000 mg | ORAL_TABLET | Freq: Four times a day (QID) | ORAL | Status: DC | PRN
  Administered 2021-01-02: 10 mg via ORAL
  Filled 2021-01-01: qty 1

## 2021-01-01 MED ORDER — OXYCODONE-ACETAMINOPHEN 5-325 MG PO TABS
1.0000 | ORAL_TABLET | ORAL | Status: DC | PRN
Start: 2021-01-01 — End: 2021-01-03
  Administered 2021-01-02 – 2021-01-03 (×2): 1 via ORAL
  Filled 2021-01-01 (×2): qty 1

## 2021-01-01 MED ORDER — ALBUTEROL SULFATE (2.5 MG/3ML) 0.083% IN NEBU
2.5000 mg | INHALATION_SOLUTION | Freq: Four times a day (QID) | RESPIRATORY_TRACT | Status: DC
  Administered 2021-01-01 – 2021-01-02 (×2): 2.5 mg via RESPIRATORY_TRACT
  Filled 2021-01-01 (×3): qty 3

## 2021-01-01 MED ORDER — HEPARIN BOLUS VIA INFUSION
2000.0000 [IU] | Freq: Once | INTRAVENOUS | Status: AC
  Administered 2021-01-01: 2000 [IU] via INTRAVENOUS
  Filled 2021-01-01: qty 2000

## 2021-01-01 MED ORDER — ASPIRIN EC 81 MG PO TBEC
81.0000 mg | DELAYED_RELEASE_TABLET | ORAL | Status: DC
  Administered 2021-01-02 – 2021-01-06 (×3): 81 mg via ORAL
  Filled 2021-01-01 (×3): qty 1

## 2021-01-01 MED ORDER — MORPHINE SULFATE ER 15 MG PO TBCR
30.0000 mg | EXTENDED_RELEASE_TABLET | Freq: Two times a day (BID) | ORAL | Status: DC
  Administered 2021-01-02 – 2021-01-06 (×10): 30 mg via ORAL
  Filled 2021-01-01 (×11): qty 2

## 2021-01-01 MED ORDER — IOHEXOL 350 MG/ML SOLN
80.0000 mL | Freq: Once | INTRAVENOUS | Status: AC | PRN
  Administered 2021-01-01: 60 mL via INTRAVENOUS

## 2021-01-01 MED ORDER — ONDANSETRON HCL 4 MG PO TABS: 4.0000 mg | ORAL_TABLET | Freq: Four times a day (QID) | ORAL | Status: DC | PRN

## 2021-01-01 MED ORDER — MEGESTROL ACETATE 400 MG/10ML PO SUSP
625.0000 mg | Freq: Every day | ORAL | Status: DC
  Administered 2021-01-02: 625 mg via ORAL
  Filled 2021-01-01: qty 20

## 2021-01-01 MED ORDER — ONDANSETRON HCL 4 MG/2ML IJ SOLN
4.0000 mg | Freq: Four times a day (QID) | INTRAMUSCULAR | Status: DC | PRN
  Administered 2021-01-04: 4 mg via INTRAVENOUS
  Filled 2021-01-01: qty 2

## 2021-01-01 MED ORDER — SODIUM CHLORIDE 0.9 % IV SOLN: INTRAVENOUS | Status: DC

## 2021-01-01 MED ORDER — ROSUVASTATIN CALCIUM 10 MG PO TABS
10.0000 mg | ORAL_TABLET | Freq: Every day | ORAL | Status: DC
  Administered 2021-01-02 – 2021-01-05 (×5): 10 mg via ORAL
  Filled 2021-01-01 (×5): qty 1

## 2021-01-01 MED ORDER — MORPHINE SULFATE (PF) 4 MG/ML IV SOLN
4.0000 mg | Freq: Once | INTRAVENOUS | Status: AC
Start: 2021-01-01 — End: 2021-01-01
  Administered 2021-01-01: 4 mg via INTRAVENOUS
  Filled 2021-01-01: qty 1

## 2021-01-01 MED ORDER — ACETAMINOPHEN 325 MG PO TABS: 975.0000 mg | ORAL_TABLET | Freq: Three times a day (TID) | ORAL | Status: DC | PRN

## 2021-01-01 NOTE — Discharge Instructions (Addendum)
Information on my medicine - ELIQUIS (apixaban)  Why was Eliquis prescribed for you? Eliquis was prescribed to treat blood clots that may have been found in the veins of your legs (deep vein thrombosis) or in your lungs (pulmonary embolism) and to reduce the risk of them occurring again.  What do You need to know about Eliquis ? The starting dose is 10 mg (two 5 mg tablets) taken TWICE daily for the FIRST SEVEN (7) DAYS, then on (enter date)  01/10/21  the dose is reduced to ONE 5 mg tablet taken TWICE daily.  Eliquis may be taken with or without food.   Try to take the dose about the same time in the morning and in the evening. If you have difficulty swallowing the tablet whole please discuss with your pharmacist how to take the medication safely.  Take Eliquis exactly as prescribed and DO NOT stop taking Eliquis without talking to the doctor who prescribed the medication.  Stopping may increase your risk of developing a new blood clot.  Refill your prescription before you run out.  After discharge, you should have regular check-up appointments with your healthcare provider that is prescribing your Eliquis.    What do you do if you miss a dose? If a dose of ELIQUIS is not taken at the scheduled time, take it as soon as possible on the same day and twice-daily administration should be resumed. The dose should not be doubled to make up for a missed dose.  Important Safety Information A possible side effect of Eliquis is bleeding. You should call your healthcare provider right away if you experience any of the following: Bleeding from an injury or your nose that does not stop. Unusual colored urine (red or dark brown) or unusual colored stools (red or black). Unusual bruising for unknown reasons. A serious fall or if you hit your head (even if there is no bleeding).  Some medicines may interact with Eliquis and might increase your risk of bleeding or clotting while on Eliquis. To  help avoid this, consult your healthcare provider or pharmacist prior to using any new prescription or non-prescription medications, including herbals, vitamins, non-steroidal anti-inflammatory drugs (NSAIDs) and supplements.  This website has more information on Eliquis (apixaban): http://[email or web address].[email or web address]

## 2021-01-01 NOTE — ED Triage Notes (Signed)
Pt states that she had her first chemo for lung CA on Wednesday and has been having trouble eating and drinking since then. Pt also has lower back pain. O2 88% RA.

## 2021-01-01 NOTE — ED Notes (Signed)
Pt requested ice pack for her back, ice pack applied to pt back.

## 2021-01-01 NOTE — H&P (Signed)
History and Physical   Kristi Jennings WYO:[id number] DOB: [birth year] DOA: 01/01/2021  Referring MD/NP/PA: Campbell Stall, DO  PCP: [name], MD   Outpatient Specialists: Dr. Lorna Few  Patient coming from: Home  Chief Complaint: Shortness of breath and chest pain  HPI: Kristi Jennings is a 75 y.o. female with medical history significant of lung cancer on the right, polymyalgia rheumatica, diabetes, hyperlipidemia, essential hypertension who is currently on palliative radiation therapy which started 2 weeks ago presented to the ER with decreased oral intake chest pain as well as weakness.  Her shortness of breath is progressive.  Associated with 7 out of 10 pain in the lower back.  Also some fatigue as well as generalized malaise.  She used to be a smoker but quit.  No formal diagnosis for COPD.  Patient also has no fever or chills and no sick contacts.  Work-up in the ER showed bilateral pulmonary embolism.  This most likely triggered by her lung cancer.  Patient being admitted to the hospital for further evaluation and treatment..  ED Course: Temperature 98.7, blood pressure 162/83 pulse 87 respiratory of 19 oxygen sat 88% on room air.  Sodium 131 potassium 3.5 chloride 91 CO2 28 glucose 104 BUN 14 creatinine 1.40.  White count 10.8 and the rest of the CBC within normal.  Initial troponin 32nd 132.  BNP of 185.  D-dimer 3.74, CT of the lumbar spine showed slight progression of a compression fracture of T12.  Also some lytic change of the posterior vertebral body.  Suspected pathologic fracture.  There is also L4-L5 disease.  CT angiogram of the chest shows changes consistent with pulmonary embolism with no right heart strain.  This is bilateral in the lower lobes.  There is evidence of a large cavitary necrotic mass in the right upper lobe similar to that seen earlier.  Patient is being admitted after initiation of heparin drip.  Review of Systems: As per HPI otherwise 10 point review of  systems negative.    Past Medical History:  Diagnosis Date   Borderline diabetes mellitus    Bronchogenic cancer of right lung ([place])    Dyslipidemia    Hypertension    Polymyalgia rheumatica ([place])     History reviewed. No pertinent surgical history.   reports that she quit smoking about 12 years ago. Her smoking use included cigarettes. She has a 43.00 pack-year smoking history. She has never used smokeless tobacco. She reports that she does not currently use alcohol. She reports that she does not use drugs.  Allergies  Allergen Reactions   Shellfish-Derived Products Hives, Itching and Rash   Augmentin [Amoxicillin-Pot Clavulanate] Nausea And Vomiting    Family History  Problem Relation Age of Onset   Heart attack Mother    Heart disease Mother    Colon cancer Father    ALS Brother      Prior to Admission medications   Medication Sig Start Date End Date Taking? Authorizing Provider  acetaminophen (TYLENOL) 500 MG tablet 1 tablet as needed    [provider]  aspirin EC 81 MG tablet Take 81 mg by mouth 4 (four) times a week. Swallow whole.    [provider]  atenolol (TENORMIN) 25 MG tablet Take 25 mg by mouth daily. 07/22/20   [provider]  benzonatate (TESSALON) 200 MG capsule Take 1 capsule (200 mg total) by mouth every 8 (eight) hours. Patient not taking: Reported on 12/29/2020 09/29/20   Raylene Everts,  MD  diclofenac Sodium (VOLTAREN) 1 % GEL Apply 4 g topically 4 (four) times daily. Patient not taking: Reported on 12/29/2020 12/26/20   Malvin Johns, MD  escitalopram (LEXAPRO) 10 MG tablet Take 1 tablet by mouth at bedtime. 09/16/20   [provider]  folic acid (FOLVITE) 1 MG tablet Take 1 tablet (1 mg total) by mouth daily. 12/29/20   Heilingoetter, Cassandra L, PA-C  losartan (COZAAR) 50 MG tablet Take 1 tablet by mouth daily. 01/16/14   [provider]  megestrol (MEGACE ES) 625 MG/5ML suspension Take 5 mLs (625 mg  total) by mouth daily. 12/29/20   Curt Bears, MD  morphine (MS CONTIN) 30 MG 12 hr tablet Take 1 tablet (30 mg total) by mouth every 12 (twelve) hours. 12/30/20   Curt Bears, MD  oxyCODONE-acetaminophen (PERCOCET/ROXICET) 5-325 MG tablet Take 1 tablet by mouth every 4 (four) hours as needed for severe pain. For breakthru pain 12/30/20   Bruning, Ashlyn, PA-C  prochlorperazine (COMPAZINE) 10 MG tablet Take 1 tablet (10 mg total) by mouth every 6 (six) hours as needed for nausea or vomiting. Patient not taking: No sig reported 11/30/20   Curt Bears, MD  rosuvastatin (CRESTOR) 10 MG tablet Take 10 mg by mouth at bedtime. 02/23/20   [provider]    Physical Exam: Vitals:   01/01/21 1810 01/01/21 1900 01/01/21 2010 01/01/21 2036  BP: (!) 143/67 (!) 147/81 (!) 162/83   Pulse: 76 80 87   Resp: 17 19 16    Temp:   98.7 F (37.1 C)   TempSrc:   Oral   SpO2: 97% 100% 98% 96%  Weight:   44.4 kg   Height:          Constitutional: Chronically ill looking, cachectic, mild distress Vitals:   01/01/21 1810 01/01/21 1900 01/01/21 2010 01/01/21 2036  BP: (!) 143/67 (!) 147/81 (!) 162/83   Pulse: 76 80 87   Resp: 17 19 16    Temp:   98.7 F (37.1 C)   TempSrc:   Oral   SpO2: 97% 100% 98% 96%  Weight:   44.4 kg   Height:       Eyes: PERRL, lids and conjunctivae normal ENMT: Mucous membranes are dry posterior pharynx clear of any exudate or lesions.Normal dentition.  Neck: normal, supple, no masses, no thyromegaly Respiratory: Coarse breath sounds bilaterally with rhonchi and rales bilaterally, no wheezing, no crackles. Normal respiratory effort. No accessory muscle use.  Cardiovascular: Regular rate and rhythm, no murmurs / rubs / gallops. No extremity edema. 2+ pedal pulses. No carotid bruits.  Abdomen: no tenderness, no masses palpated. No hepatosplenomegaly. Bowel sounds positive.  Musculoskeletal: no clubbing / cyanosis. No joint deformity upper and lower  extremities. Good ROM, no contractures. Normal muscle tone.  Skin: no rashes, lesions, ulcers. No induration Neurologic: CN 2-12 grossly intact. Sensation intact, DTR normal. Strength 5/5 in all 4.  Psychiatric: Normal judgment and insight. Alert and oriented x 3.  Depressed mood.     Labs on Admission: I have personally reviewed following labs and imaging studies  CBC: Recent Labs  Lab 12/29/20 0832 01/01/21 1518  WBC 10.9* 10.8*  NEUTROABS 8.6* 9.6*  HGB 11.2* 12.0  HCT 33.1* 38.2  MCV 84.7 89.0  PLT 309 992   Basic Metabolic Panel: Recent Labs  Lab 12/29/20 0832 01/01/21 1518  NA 130* 131*  K 3.8 3.5  CL 95* 91*  CO2 24 28  GLUCOSE 130* 104*  BUN 19 14  CREATININE 0.55 0.40*  CALCIUM 9.0 8.8*  MG  --  2.0   GFR: Estimated Creatinine Clearance: 43.9 mL/min (A) (by C-G formula based on SCr of 0.4 mg/dL (L)). Liver Function Tests: Recent Labs  Lab 12/29/20 0832 01/01/21 1518  AST 18 27  ALT 14 20  ALKPHOS 107 84  BILITOT 0.5 0.6  PROT 6.4* 6.8  ALBUMIN 2.8* 3.2*   Recent Labs  Lab 01/01/21 1518  LIPASE 22   No results for input(s): AMMONIA in the last 168 hours. Coagulation Profile: No results for input(s): INR, PROTIME in the last 168 hours. Cardiac Enzymes: No results for input(s): CKTOTAL, CKMB, CKMBINDEX, TROPONINI in the last 168 hours. BNP (last 3 results) No results for input(s): PROBNP in the last 8760 hours. HbA1C: No results for input(s): HGBA1C in the last 72 hours. CBG: No results for input(s): GLUCAP in the last 168 hours. Lipid Profile: No results for input(s): CHOL, HDL, LDLCALC, TRIG, CHOLHDL, LDLDIRECT in the last 72 hours. Thyroid Function Tests: No results for input(s): TSH, T4TOTAL, FREET4, T3FREE, THYROIDAB in the last 72 hours. Anemia Panel: No results for input(s): VITAMINB12, FOLATE, FERRITIN, TIBC, IRON, RETICCTPCT in the last 72 hours. Urine analysis: No results found for: COLORURINE, APPEARANCEUR, LABSPEC, PHURINE,  GLUCOSEU, HGBUR, BILIRUBINUR, KETONESUR, PROTEINUR, UROBILINOGEN, NITRITE, LEUKOCYTESUR Sepsis Labs: @LABRCNTIP (procalcitonin:4,lacticidven:4) ) Recent Results (from the past 240 hour(s))  Resp Panel by RT-PCR (Flu A&B, Covid) Nasopharyngeal Swab     Status: None   Collection Time: 01/01/21  3:19 PM   Specimen: Nasopharyngeal Swab; Nasopharyngeal(NP) swabs in vial transport medium  Result Value Ref Range Status   SARS Coronavirus 2 by RT PCR NEGATIVE NEGATIVE Final    Comment: (NOTE) SARS-CoV-2 target nucleic acids are NOT DETECTED.  The SARS-CoV-2 RNA is generally detectable in upper respiratory specimens during the acute phase of infection. The lowest concentration of SARS-CoV-2 viral copies this assay can detect is 138 copies/mL. A negative result does not preclude SARS-Cov-2 infection and should not be used as the sole basis for treatment or other patient management decisions. A negative result may occur with  improper specimen collection/handling, submission of specimen other than nasopharyngeal swab, presence of viral mutation(s) within the areas targeted by this assay, and inadequate number of viral copies(<138 copies/mL). A negative result must be combined with clinical observations, patient history, and epidemiological information. The expected result is Negative.  Fact Sheet for Patients:  EntrepreneurPulse.com.au  Fact Sheet for Healthcare Providers:  IncredibleEmployment.be  This test is no t yet approved or cleared by the [place] FDA and  has been authorized for detection and/or diagnosis of SARS-CoV-2 by FDA under an Emergency Use Authorization (EUA). This EUA will remain  in effect (meaning this test can be used) for the duration of the COVID-19 declaration under Section 564(b)(1) of the Act, 21 U.S.C.section 360bbb-3(b)(1), unless the authorization is terminated  or revoked sooner.       Influenza A by PCR NEGATIVE  NEGATIVE Final   Influenza B by PCR NEGATIVE NEGATIVE Final    Comment: (NOTE) The Xpert Xpress SARS-CoV-2/FLU/RSV plus assay is intended as an aid in the diagnosis of influenza from Nasopharyngeal swab specimens and should not be used as a sole basis for treatment. Nasal washings and aspirates are unacceptable for Xpert Xpress SARS-CoV-2/FLU/RSV testing.  Fact Sheet for Patients: EntrepreneurPulse.com.au  Fact Sheet for Healthcare Providers: IncredibleEmployment.be  This test is not yet approved or cleared by the [place] FDA and has been authorized for detection and/or diagnosis  of SARS-CoV-2 by FDA under an Emergency Use Authorization (EUA). This EUA will remain in effect (meaning this test can be used) for the duration of the COVID-19 declaration under Section 564(b)(1) of the Act, 21 U.S.C. section 360bbb-3(b)(1), unless the authorization is terminated or revoked.  Performed at [facility], [place] [address] [place], [place] [address]      Radiological Exams on Admission: CT Angio Chest PE W and/or Wo Contrast  Result Date: 01/01/2021 CLINICAL DATA:  History of lung carcinoma and recent bronchoscopy with shortness of breath EXAM: CT ANGIOGRAPHY CHEST WITH CONTRAST TECHNIQUE: Multidetector CT imaging of the chest was performed using the standard protocol during bolus administration of intravenous contrast. Multiplanar CT image reconstructions and MIPs were obtained to evaluate the vascular anatomy. CONTRAST:  9mL OMNIPAQUE IOHEXOL 350 MG/ML SOLN COMPARISON:  10/20/2020 CT FINDINGS: Cardiovascular: Thoracic aorta demonstrates normal branching pattern. Atherosclerotic calcifications are noted without dissection. Aneurysmal dilatation of the ascending aorta is noted to 4.4 cm. Descending thoracic aorta is normal in caliber. No significant cardiac enlargement is noted. No evidence of right heart strain is seen. The pulmonary  artery is not as well opacified as the aorta although filling defects are noted in branches of the right lower lobe and left lower lobe. No definitive upper lobe filling defects are seen although the right upper lobe pulmonary arterial branches are significantly attenuated by the known underlying mass lesion. Mediastinum/Nodes: Thoracic inlet is within normal limits. Known lymphadenopathy in the precarinal and right paratracheal region are less well evaluated on the current exam but appears stable. This is mostly related to the timing of the contrast bolus being arterial in nature. The known right upper lobe mass lesion extends into the superior aspect of the right hilum with likely underlying small confluent lymph nodes present. The esophagus as visualized is within normal limits. Lungs/Pleura: Left lung is well aerated without evidence of focal infiltrate or effusion. Right lung demonstrates the large centrally necrotic and partially cavitary mass lesion along the posterior aspect of the right upper lobe bowing the major fissure similar to that noted on the prior exam. It measures approximately 6.7 x 6.3 cm in greatest dimension and given the slight variation in the imaging technique is relatively stable. Small right-sided pleural effusion is noted new from the prior CT examination. No other focal parenchymal nodules are seen. Upper Abdomen: A few small peripherally enhancing mass lesions are noted in the right lobe of the liver inferiorly consistent with metastatic disease. Largest of these is noted posteriorly in the right lobe of the liver measuring approximately 3.6 cm and not as well visualized on the prior CT examination. The right adrenal gland is significantly enlarged similar to that noted on prior exam consistent with metastatic disease. No other focal abnormality is noted. Musculoskeletal: There is an expansile lesion of the anterolateral right third rib which was not present on the prior exam from  10/20/2020 consistent with a very aggressive metastatic process. No other rib lesions are seen. Progressive mottled metastatic disease is noted involving T8 and T12 with progressive compression deformity when compared with the prior CT examination. Mild compression deformity is noted at T4 as well but appears stable from the prior exam. Lytic lesion is noted in the central portion of the left clavicle with some destruction of the cortex medially Review of the MIP images confirms the above findings. IMPRESSION: Changes consistent with mild pulmonary embolism without evidence of right heart strain. These are predominately in the lower lobe  branches bilaterally. Large cavitary in centrally necrotic mass lesion in the right upper lobe similar to that seen on prior CT examination consistent with the known history of carcinoma of the lung. Associated mediastinal adenopathy as well as hepatic, adrenal and bony metastatic disease is noted. The hepatic and bony metastatic disease has progressed significantly in the interval from the prior CT. New small right-sided pleural effusion. Aortic dilatation of the ascending aorta as described. Recommend annual imaging followup by CTA or MRA. This recommendation follows 2010 ACCF/AHA/AATS/ACR/ASA/SCA/SCAI/SIR/STS/SVM Guidelines for the Diagnosis and Management of Patients with Thoracic Aortic Disease. Circulation. 2010; 121: H846-N629. Aortic aneurysm NOS (ICD10-I71.9) Aortic Atherosclerosis (ICD10-I70.0). Critical Value/emergent results were called by telephone at the time of interpretation on 01/01/2021 at 5:28 pm to Dr. Campbell Stall , who verbally acknowledged these results. Electronically Signed   By: Inez Catalina M.D.   On: 01/01/2021 17:31   CT Lumbar Spine Wo Contrast  Result Date: 01/01/2021 CLINICAL DATA:  Bone neoplasm, lumbosacral spine, recurrent suspected. Metastatic lung cancer. EXAM: CT LUMBAR SPINE WITHOUT CONTRAST TECHNIQUE: Multidetector CT imaging of the lumbar  spine was performed without intravenous contrast administration. Multiplanar CT image reconstructions were also generated. COMPARISON:  PET scan 11/09/2020.  CT abdomen 10/20/2020. FINDINGS: Segmentation: 5 lumbar type vertebral bodies assumed. Alignment: No significant malalignment. Vertebrae: Previously seen inferior endplate fracture at U13 has progressed slightly since the study of 10/21/2018. Vertebral body height anteriorly now measures 13 mm compared with 16 mm on the previous study. Mild superior endplate depression now present as well. Posterior bowing of the posterior margin of the vertebral body. Relative lytic change of the vertebral body does raise suspicion that this could be a pathologic fracture. MRI with and without contrast suggested when able. Paraspinal and other soft tissues: See results abdominal CT. Disc levels: Mild encroachment upon the canal at the T12 level but without definite neural compression. No significant disc space pathology at L2-3 or above. L3-4: Disc bulge. Mild facet hypertrophy. Mild bilateral lateral recess stenosis. L4-5: Endplate osteophytes. Disc bulge. Facet and ligamentous hypertrophy. Moderate multifactorial spinal stenosis that could cause neural compression. L5-S1: Disc bulge. Facet degeneration. No likely neural compression. IMPRESSION: Slight progression of a compression fracture at T12 since the previous CT study of 10/20/2020. Some lytic change of the posterior vertebral body. This could be a pathologic fracture. MRI with contrast recommended when able. Mild encroachment upon the canal but no definite compressive stenosis. L4-5: Moderate multifactorial spinal stenosis that could cause neural compression. Degenerative disc disease and degenerative facet disease at other levels throughout the lumbar region as described above which could be associated with regional back pain. Electronically Signed   By: Nelson Chimes M.D.   On: 01/01/2021 16:34   DG Chest Portable 1  View  Result Date: 01/01/2021 CLINICAL DATA:  Shortness of breath. Patient had first chemo treatment for lung cancer few days ago. Difficulty eating and drinking. EXAM: PORTABLE CHEST 1 VIEW COMPARISON:  December 26, 2020 FINDINGS: Bilateral skin folds are identified. No pneumothorax. Known malignancy in the upper medial right chest. No new nodules, masses, or focal infiltrates. The cardiomediastinal silhouette is stable. IMPRESSION: Known malignancy in the medial right upper lobe. No other interval changes or acute abnormalities. Electronically Signed   By: Dorise Bullion III M.D.   On: 01/01/2021 16:11      Assessment/Plan Principal Problem:   Pulmonary emboli (HCC) Active Problems:   Benign essential hypertension   Polymyalgia rheumatica (HCC)   Non-small cell carcinoma of lung,  stage 4, right (HCC)   Compression fracture of T12 vertebra (HCC)   Lumbar radiculopathy     #1 bilateral pulmonary emboli: Patient will be admitted.  Initiated on heparin drip.  Pain management.  Oxygen.  Supportive care.  Most likely cause of pulmonary embolism is secondary to her lung cancer.  Will not initiate echocardiogram since patient has mild disease with no right heart strain.  Patient will be transition to oral anticoagulants prior to discharge home  #2 stage IV B non-small cell lung cancer: Continue radiation therapy per oncology.  On MS Contin we will continue with that as well as oxycodone.  As needed Dilaudid  #3 essential hypertension: Patient on atenolol at home.  We will continue  #4 compression fractures of the T12: Appears to be pathologic fracture.  Continue radiation therapy and follow with oncology  #5 depression with anxiety: Continue Lexapro.  #6 hyperlipidemia: Continue with Crestor   DVT prophylaxis: Heparin drip Code Status: Full code Family Communication: No family at bedside Disposition Plan: Home Consults called: None but oncology to see in the morning Admission status:  Inpatient  Severity of Illness: The appropriate patient status for this patient is INPATIENT. Inpatient status is judged to be reasonable and necessary in order to provide the required intensity of service to ensure the patient's safety. The patient's presenting symptoms, physical exam findings, and initial radiographic and laboratory data in the context of their chronic comorbidities is felt to place them at high risk for further clinical deterioration. Furthermore, it is not anticipated that the patient will be medically stable for discharge from the hospital within 2 midnights of admission. The following factors support the patient status of inpatient.   " The patient's presenting symptoms include shortness of breath and chest pain. " The worrisome physical exam findings include cachexia with coarse breath sounds. " The initial radiographic and laboratory data are worrisome because of bilateral PE. " The chronic co-morbidities include stage IV lung cancer.   * I certify that at the point of admission it is my clinical judgment that the patient will require inpatient hospital care spanning beyond 2 midnights from the point of admission due to high intensity of service, high risk for further deterioration and high frequency of surveillance required.Barbette Merino MD [facility] Pager [phone]  If 7PM-7AM, please contact night-coverage [email or web address] Password [facility]  01/01/2021, 11:38 PM

## 2021-01-01 NOTE — ED Notes (Signed)
Pt requested heat packs for her arm and neck, given to pt.

## 2021-01-01 NOTE — Progress Notes (Signed)
ANTICOAGULATION CONSULT NOTE - Initial Consult  Pharmacy Consult for heparin Indication: pulmonary embolus  Allergies  Allergen Reactions   Shellfish-Derived Products Hives, Itching and Rash   Augmentin [Amoxicillin-Pot Clavulanate] Nausea And Vomiting    Patient Measurements: Height: 5\' 5"  (165.1 cm) Weight: 40.8 kg (90 lb) IBW/kg (Calculated) : 57 Heparin Dosing Weight: 40.8 kg TBW  Vital Signs: Temp: 97.4 F (36.3 C) (08/13 1449) Temp Source: Oral (08/13 1449) BP: 143/67 (08/13 1810) Pulse Rate: 76 (08/13 1810)  Labs: Recent Labs    01/01/21 1518 01/01/21 1726  HGB 12.0  --   HCT 38.2  --   PLT 210  --   CREATININE 0.40*  --   TROPONINIHS 30* 32*    Estimated Creatinine Clearance: 40.3 mL/min (A) (by C-G formula based on SCr of 0.4 mg/dL (L)).   Medical History: Past Medical History:  Diagnosis Date   Borderline diabetes mellitus    Bronchogenic cancer of right lung (HCC)    Dyslipidemia    Hypertension    Polymyalgia rheumatica (HCC)     Medications:  Med rec not yet done but no hx anticoagulants  Assessment: 75 yo F with PE to start heparin per pharmacy.    CTA: "Changes consistent with mild pulmonary embolism without evidence of right heart strain. These are predominately in the lower lobe branches bilaterally"  CBC WNL, D Dimer 3.74; SCr 0.4  Goal of Therapy:  Heparin level 0.3-0.7 units/ml Monitor platelets by anticoagulation protocol: Yes   Plan:  Heparin 2000 unit bolus IV x 1 Heparin drip at 750 units/hr Check 8 hr heparin level Daily CBC & heparin level  Eudelia Bunch, Pharm.D 01/01/2021 7:03 PM

## 2021-01-01 NOTE — ED Provider Notes (Signed)
[facility] DEPT Provider Note   CSN: [id number] Arrival date & time: 01/01/21  1432     History Chief Complaint  Patient presents with   Dehydration    Kristi Jennings is a 75 y.o. female.  Patient is a 75 year old female with past medical history of lung carcinoma presenting for complaints of weakness, fatigue, shortness of breath, and lower back pain.   Pt admits to decreased oral intake, progressive sob, and generalized weakness x weeks.  Denies any fevers, chills, nausea, vomiting, diarrhea.  Denies hematemesis, hematuria, melena, or hematochezia.  She also has complaints of lower back pain, new, nonradiating, constant, and described as aching.  Denies motor or sensory deficits.  Denies urinary or bowel incontinence.  The history is provided by the patient. No language interpreter was used.  Shortness of Breath Severity:  Moderate Onset quality:  Gradual Timing:  Constant Progression:  Worsening Associated symptoms: no abdominal pain, no chest pain, no cough, no ear pain, no fever, no rash, no sore throat and no vomiting       Past Medical History:  Diagnosis Date   Borderline diabetes mellitus    Bronchogenic cancer of right lung (HCC)    Dyslipidemia    Hypertension    Polymyalgia rheumatica ([place])     Patient Active Problem List   Diagnosis Date Noted   Non-small cell carcinoma of lung, stage 4, right ([place]) 11/30/2020   Encounter for antineoplastic chemotherapy 11/30/2020   Encounter for antineoplastic immunotherapy 11/30/2020   Cancer associated pain 11/30/2020   Malnutrition, calorie ([place]) 11/30/2020   Polymyalgia rheumatica ([place]) 08/12/2019   Allergic rhinitis 06/10/2015   Anxiety 06/10/2015   Benign essential hypertension 06/10/2015    History reviewed. No pertinent surgical history.   OB History   No obstetric history on file.     Family History  Problem Relation Age of Onset   Heart attack Mother    Heart disease  Mother    Colon cancer Father    ALS Brother     Social History   Tobacco Use   Smoking status: Former    Packs/day: 1.00    Years: 43.00    Pack years: 43.00    Types: Cigarettes    Quit date: 11/23/2008    Years since quitting: 12.1   Smokeless tobacco: Never   Tobacco comments:    quit 2010  Vaping Use   Vaping Use: Never used  Substance Use Topics   Alcohol use: Not Currently   Drug use: Never    Home Medications Prior to Admission medications   Medication Sig Start Date End Date Taking? Authorizing Provider  acetaminophen (TYLENOL) 500 MG tablet 1 tablet as needed    [provider]  aspirin EC 81 MG tablet Take 81 mg by mouth 4 (four) times a week. Swallow whole.    [provider]  atenolol (TENORMIN) 25 MG tablet Take 25 mg by mouth daily. 07/22/20   [provider]  benzonatate (TESSALON) 200 MG capsule Take 1 capsule (200 mg total) by mouth every 8 (eight) hours. Patient not taking: Reported on 12/29/2020 09/29/20   Raylene Everts, MD  diclofenac Sodium (VOLTAREN) 1 % GEL Apply 4 g topically 4 (four) times daily. Patient not taking: Reported on 12/29/2020 12/26/20   Malvin Johns, MD  escitalopram (LEXAPRO) 10 MG tablet Take 1 tablet by mouth at bedtime. 09/16/20   [provider]  folic acid (FOLVITE) 1 MG tablet Take 1 tablet (1  mg total) by mouth daily. 12/29/20   Heilingoetter, Cassandra L, PA-C  losartan (COZAAR) 50 MG tablet Take 1 tablet by mouth daily. 01/16/14   [provider]  megestrol (MEGACE ES) 625 MG/5ML suspension Take 5 mLs (625 mg total) by mouth daily. 12/29/20   Curt Bears, MD  morphine (MS CONTIN) 30 MG 12 hr tablet Take 1 tablet (30 mg total) by mouth every 12 (twelve) hours. 12/30/20   Curt Bears, MD  oxyCODONE-acetaminophen (PERCOCET/ROXICET) 5-325 MG tablet Take 1 tablet by mouth every 4 (four) hours as needed for severe pain. For breakthru pain 12/30/20   Bruning, Ashlyn, PA-C   prochlorperazine (COMPAZINE) 10 MG tablet Take 1 tablet (10 mg total) by mouth every 6 (six) hours as needed for nausea or vomiting. Patient not taking: No sig reported 11/30/20   Curt Bears, MD  rosuvastatin (CRESTOR) 10 MG tablet Take 10 mg by mouth at bedtime. 02/23/20   [provider]    Allergies    Shellfish-derived products and Augmentin [amoxicillin-pot clavulanate]  Review of Systems   Review of Systems  Constitutional:  Positive for fatigue. Negative for chills and fever.  HENT:  Negative for ear pain and sore throat.   Eyes:  Negative for pain and visual disturbance.  Respiratory:  Positive for shortness of breath. Negative for cough.   Cardiovascular:  Negative for chest pain and palpitations.  Gastrointestinal:  Negative for abdominal pain and vomiting.  Genitourinary:  Negative for dysuria and hematuria.  Musculoskeletal:  Positive for back pain. Negative for arthralgias.  Skin:  Negative for color change and rash.  Neurological:  Positive for weakness. Negative for seizures and syncope.  All other systems reviewed and are negative.  Physical Exam Updated Vital Signs BP 123/61   Pulse 79   Temp (!) 97.4 F (36.3 C) (Oral)   Resp 18   Ht 5\' 5"  (1.651 m)   Wt 40.8 kg   SpO2 (!) 88%   BMI 14.98 kg/m   Physical Exam Vitals and nursing note reviewed.  Constitutional:      General: She is not in acute distress.    Appearance: She is cachectic.  HENT:     Head: Normocephalic and atraumatic.  Eyes:     Conjunctiva/sclera: Conjunctivae normal.  Cardiovascular:     Rate and Rhythm: Normal rate and regular rhythm.     Heart sounds: No murmur heard. Pulmonary:     Effort: Pulmonary effort is normal. No respiratory distress.     Breath sounds: Normal breath sounds.  Abdominal:     Palpations: Abdomen is soft.     Tenderness: There is no abdominal tenderness.  Musculoskeletal:     Cervical back: Neck supple. No bony tenderness.     Thoracic back:  No bony tenderness.     Lumbar back: No bony tenderness.  Skin:    General: Skin is warm and dry.  Neurological:     Mental Status: She is alert.     GCS: GCS eye subscore is 4. GCS verbal subscore is 5. GCS motor subscore is 6.     Cranial Nerves: Cranial nerves are intact.     Sensory: Sensation is intact.     Motor: Weakness (generalized) present.    ED Results / Procedures / Treatments   Labs (all labs ordered are listed, but only abnormal results are displayed) Labs Reviewed  CBC WITH DIFFERENTIAL/PLATELET - Abnormal; Notable for the following components:      Result Value   WBC  10.8 (*)    Neutro Abs 9.6 (*)    Lymphs Abs 0.6 (*)    All other components within normal limits  D-DIMER, QUANTITATIVE - Abnormal; Notable for the following components:   D-Dimer, Quant 3.74 (*)    All other components within normal limits  RESP PANEL BY RT-PCR (FLU A&B, COVID) ARPGX2  BASIC METABOLIC PANEL  HEPATIC FUNCTION PANEL  LIPASE, BLOOD  BRAIN NATRIURETIC PEPTIDE  MAGNESIUM  TROPONIN I (HIGH SENSITIVITY)    EKG None  Radiology No results found.  Procedures .Critical Care  Date/Time: 01/01/2021 6:37 PM Performed by: Lianne Cure, DO Authorized by: Lianne Cure, DO   Critical care provider statement:    Critical care time (minutes):  30   Critical care was necessary to treat or prevent imminent or life-threatening deterioration of the following conditions:  Respiratory failure (Bilateral pulmonary embolisms requiring heparin therapy. Respiratory failure with hypoxia requiring oxygren therapy.)   Critical care was time spent personally by me on the following activities:  Development of treatment plan with patient or surrogate, discussions with consultants, evaluation of patient's response to treatment, examination of patient, obtaining history from patient or surrogate, ordering and performing treatments and interventions, ordering and review of laboratory studies, ordering  and review of radiographic studies, pulse oximetry and re-evaluation of patient's condition   I assumed direction of critical care for this patient from another provider in my specialty: yes     Care discussed with: admitting provider     Medications Ordered in ED Medications  sodium chloride 0.9 % bolus 500 mL (has no administration in time range)  ondansetron (ZOFRAN) injection 4 mg (has no administration in time range)    ED Course  I have reviewed the triage vital signs and the nursing notes.  Pertinent labs & imaging results that were available during my care of the patient were reviewed by me and considered in my medical decision making (see chart for details).    MDM Rules/Calculators/A&P  48:22 PM 75 year old female with past medical history of lung carcinoma presenting for complaints of weakness, fatigue, shortness of breath, and lower back pain.  Patient is alert and oriented x3, respiratory distress, satting at 88% on room air.  She has equal bilateral breath sounds with no adventitious lung sounds.  Patient has improvement of oxygenation after 2 L nasal cannula.  Weakness, fatigue, sob, new lumbar back pain:  CTPE demonstrates: Changes consistent with mild pulmonary embolism without evidence of right heart strain. These are predominately in the lower lobe branches bilaterally.   Large cavitary in centrally necrotic mass lesion in the right upper lobe similar to that seen on prior CT examination consistent with the known history of carcinoma of the lung. Associated mediastinal adenopathy as well as hepatic, adrenal and bony metastatic disease is noted. The hepatic and bony metastatic disease has progressed significantly in the interval from the prior CT.   New small right-sided pleural effusion.  CT lumbar spine demonstrates: Slight progression of a compression fracture at T12 since the previous CT study of 10/20/2020. Some lytic change of the posterior vertebral  body. This could be a pathologic fracture. MRI with contrast recommended when able. Mild encroachment upon the canal but no definite compressive stenosis.   L4-5: Moderate multifactorial spinal stenosis that could cause neural compression.   Degenerative disc disease and degenerative facet disease at other levels throughout the lumbar region as described above which could be associated with regional back pain.    -Heparin  started for PE -Patient accepted for admission or bilateral Pe's, new rib mass, new liver mass, and worsening of thoracic compression deformities.  -Oncology on consult  Final Clinical Impression(s) / ED Diagnoses Final diagnoses:  Other acute pulmonary embolism without acute cor pulmonale (HCC)  Rib lesion  Liver lesion  SOB (shortness of breath)  Malaise and fatigue  Compression fracture of thoracic vertebra, unspecified thoracic vertebral level, initial encounter (HCC)  Elevated troponin  Acute respiratory failure with hypoxia [facility])    Rx / DC Orders ED Discharge Orders     None        Lianne Cure, DO 19/91/44 1200

## 2021-01-01 NOTE — Plan of Care (Signed)
  Problem: Education: Goal: Knowledge of General Education information will improve Description: Including pain rating scale, medication(s)/side effects and non-pharmacologic comfort measures Outcome: Progressing   Problem: Health Behavior/Discharge Planning: Goal: Ability to manage health-related needs will improve Outcome: Progressing   Problem: Clinical Measurements: Goal: Will remain free from infection Outcome: Progressing   Problem: Activity: Goal: Risk for activity intolerance will decrease Outcome: Not Progressing   Problem: Nutrition: Goal: Adequate nutrition will be maintained Outcome: Not Progressing

## 2021-01-02 ENCOUNTER — Ambulatory Visit (HOSPITAL_COMMUNITY): Admission: RE | Admit: 2021-01-02 | Payer: Medicare HMO | Source: Ambulatory Visit

## 2021-01-02 DIAGNOSIS — R5383 Other fatigue: Secondary | ICD-10-CM

## 2021-01-02 DIAGNOSIS — R5381 Other malaise: Secondary | ICD-10-CM

## 2021-01-02 LAB — CBC
HCT: 29.9 % — ABNORMAL LOW (ref 36.0–46.0)
Hemoglobin: 9.7 g/dL — ABNORMAL LOW (ref 12.0–15.0)
MCH: 29 pg (ref 26.0–34.0)
MCHC: 32.4 g/dL (ref 30.0–36.0)
MCV: 89.3 fL (ref 80.0–100.0)
Platelets: 146 10*3/uL — ABNORMAL LOW (ref 150–400)
RBC: 3.35 MIL/uL — ABNORMAL LOW (ref 3.87–5.11)
RDW: 14.3 % (ref 11.5–15.5)
WBC: 7.5 10*3/uL (ref 4.0–10.5)
nRBC: 0 % (ref 0.0–0.2)

## 2021-01-02 LAB — COMPREHENSIVE METABOLIC PANEL
ALT: 27 U/L (ref 0–44)
AST: 31 U/L (ref 15–41)
Albumin: 2.6 g/dL — ABNORMAL LOW (ref 3.5–5.0)
Alkaline Phosphatase: 69 U/L (ref 38–126)
Anion gap: 7 (ref 5–15)
BUN: 10 mg/dL (ref 8–23)
CO2: 28 mmol/L (ref 22–32)
Calcium: 7.9 mg/dL — ABNORMAL LOW (ref 8.9–10.3)
Chloride: 93 mmol/L — ABNORMAL LOW (ref 98–111)
Creatinine, Ser: 0.34 mg/dL — ABNORMAL LOW (ref 0.44–1.00)
GFR, Estimated: >60 mL/min (ref >60)
Glucose, Bld: 90 mg/dL (ref 70–99)
Potassium: 3.1 mmol/L — ABNORMAL LOW (ref 3.5–5.1)
Sodium: 128 mmol/L — ABNORMAL LOW (ref 135–145)
Total Bilirubin: 0.6 mg/dL (ref 0.3–1.2)
Total Protein: 5.2 g/dL — ABNORMAL LOW (ref 6.5–8.1)

## 2021-01-02 LAB — HEPARIN LEVEL (UNFRACTIONATED)
Heparin Unfractionated: <0.1 IU/mL — ABNORMAL LOW (ref 0.30–0.70)
Heparin Unfractionated: <0.1 IU/mL — ABNORMAL LOW (ref 0.30–0.70)

## 2021-01-02 MED ORDER — HEPARIN BOLUS VIA INFUSION
1500.0000 [IU] | Freq: Once | INTRAVENOUS | Status: AC
  Administered 2021-01-02: 1500 [IU] via INTRAVENOUS
  Filled 2021-01-02: qty 1500

## 2021-01-02 MED ORDER — POTASSIUM CHLORIDE IN NACL 40-0.9 MEQ/L-% IV SOLN
INTRAVENOUS | Status: AC
  Filled 2021-01-02 (×3): qty 1000

## 2021-01-02 MED ORDER — HEPARIN (PORCINE) 25000 UT/250ML-% IV SOLN
1150.0000 [IU]/h | INTRAVENOUS | Status: AC
  Administered 2021-01-02: 1050 [IU]/h via INTRAVENOUS
  Filled 2021-01-02 (×2): qty 250

## 2021-01-02 NOTE — Progress Notes (Signed)
ANTICOAGULATION CONSULT NOTE - follow up  Pharmacy Consult for heparin Indication: pulmonary embolus  Allergies  Allergen Reactions   Shellfish-Derived Products Hives, Itching and Rash   Augmentin [Amoxicillin-Pot Clavulanate] Nausea And Vomiting    Patient Measurements: Height: 5\' 5"  (165.1 cm) Weight: 44.4 kg (97 lb 14.2 oz) IBW/kg (Calculated) : 57 Heparin Dosing Weight: 40.8 kg TBW  Vital Signs: Temp: 97.8 F (36.6 C) (08/14 1400) Temp Source: Oral (08/14 1400) BP: 131/68 (08/14 1400) Pulse Rate: 77 (08/14 1400)  Labs: Recent Labs    01/01/21 1518 01/01/21 1726 01/02/21 0342 01/02/21 1313  HGB 12.0  --  9.7*  --   HCT 38.2  --  29.9*  --   PLT 210  --  146*  --   HEPARINUNFRC  --   --  <0.10* <0.10*  CREATININE 0.40*  --  0.34*  --   TROPONINIHS 30* 32*  --   --      Estimated Creatinine Clearance: 43.9 mL/min (A) (by C-G formula based on SCr of 0.34 mg/dL (L)).   Medical History: Past Medical History:  Diagnosis Date   Borderline diabetes mellitus    Bronchogenic cancer of right lung (HCC)    Dyslipidemia    Hypertension    Polymyalgia rheumatica (HCC)     Medications:  no hx anticoagulants  Assessment: 75 yo F with PE to start heparin per pharmacy.    CTA: "Changes consistent with mild pulmonary embolism without evidence of right heart strain. These are predominately in the lower lobe branches bilaterally"  01/02/2021 HL <0.1 subtherapeutic again after 1500 units bolus and rate increase to 900 units/hr- I am concerned the IV is infiltrated but RN checked just now and it is not infiltrated & she is not bleeding.  Hgb down to 9.7, plts down to 146  Goal of Therapy:  Heparin level 0.3-0.7 units/ml Monitor platelets by anticoagulation protocol: Yes   Plan:  Repeat Heparin 1500 unit bolus IV x 1 Increase Heparin drip to 1050 units/hr Check 8 hr heparin level Daily CBC & heparin level F/u transition to oral agent  Eudelia Bunch,  Pharm.D 01/02/2021 3:00 PM

## 2021-01-02 NOTE — Progress Notes (Signed)
[facility] PROGRESS NOTE    Progress Note  Kristi Jennings  TOI:[id number] DOB: [birth year] DOA: 01/01/2021 PCP: [name], MD     Brief Narrative:   Kristi Jennings is an 75 y.o. female past medical history significant for lung cancer of the right lung polymyalgia rheumatica, diabetes mellitus type 2 on palliative radiation was started 2 weeks prior to admission comes in with d shortness of breath, chest pain accompanied by weakness.  CT angio in the ED was done that showed pulmonary embolism with no right heart strain.   Assessment/Plan:   Acute bilateral Pulmonary emboli ([place]): Continue oxygen supplementation to keep saturations greater 90%. Started on IV heparin, in the setting of lung cancer. There is a mild drop in hemoglobin from 11-10 continue to monitor. Not tachycardic not hypotensive.  Hypokalemia: Repleted IV recheck in the morning.  Hypo-volemic hyponatremia: Check urinary sodium she relates she is significantly thirsty, before coming to the hospital she relates she was dizzy upon standing.  Check orthostatics.  Benign essential hypertension Continue atenolol home dose, pressures remain stable continue to monitor intermittently.  Pathologic compression fracture of T12: CT of the lumbar spine show slight progression of T12 since previous study in June with lytic lesion, mild encroachment of the canal but now compression stenosis.  Depression/anxiety: Continue Lexapro.  Hyperlipidemia: Continue Crestor.  Polymyalgia rheumatica (HCC) Noted not on steroids.  Non-small cell carcinoma of lung, stage 4, right ([place]) Suspect squamous as he presented with a cavitary lesion with mediastinal lymphadenopathy and metastatic disease to the bone as well as the right adrenal. She was started on chemotherapy last month which is every 3 weeks. She underwent palliative radiation.  She is complaining of cervical spine pain. Will get MRI of the spine. This is an  advanced disease we will have to consult palliative care to address goals of care. Continue MS Contin and Norco for breakthrough. She is lost significant weight over the last several weeks.      DVT prophylaxis: heparin Family Communication:husband Status is: Inpatient  Remains inpatient appropriate because:Hemodynamically unstable  Dispo: The patient is from: Home              Anticipated d/c is to: Home              Patient currently is not medically stable to d/c.   Difficult to place patient No    Code Status:     Code Status Orders  (From admission, onward)           Start     Ordered   01/01/21 2025  Full code  Continuous        01/01/21 2024           Code Status History     This patient has a current code status but no historical code status.         IV Access:   Peripheral IV   Procedures and diagnostic studies:   CT Angio Chest PE W and/or Wo Contrast  Result Date: 01/01/2021 CLINICAL DATA:  History of lung carcinoma and recent bronchoscopy with shortness of breath EXAM: CT ANGIOGRAPHY CHEST WITH CONTRAST TECHNIQUE: Multidetector CT imaging of the chest was performed using the standard protocol during bolus administration of intravenous contrast. Multiplanar CT image reconstructions and MIPs were obtained to evaluate the vascular anatomy. CONTRAST:  62mL OMNIPAQUE IOHEXOL 350 MG/ML SOLN COMPARISON:  10/20/2020 CT FINDINGS: Cardiovascular: Thoracic aorta demonstrates normal branching pattern. Atherosclerotic calcifications are noted without  dissection. Aneurysmal dilatation of the ascending aorta is noted to 4.4 cm. Descending thoracic aorta is normal in caliber. No significant cardiac enlargement is noted. No evidence of right heart strain is seen. The pulmonary artery is not as well opacified as the aorta although filling defects are noted in branches of the right lower lobe and left lower lobe. No definitive upper lobe filling defects are seen  although the right upper lobe pulmonary arterial branches are significantly attenuated by the known underlying mass lesion. Mediastinum/Nodes: Thoracic inlet is within normal limits. Known lymphadenopathy in the precarinal and right paratracheal region are less well evaluated on the current exam but appears stable. This is mostly related to the timing of the contrast bolus being arterial in nature. The known right upper lobe mass lesion extends into the superior aspect of the right hilum with likely underlying small confluent lymph nodes present. The esophagus as visualized is within normal limits. Lungs/Pleura: Left lung is well aerated without evidence of focal infiltrate or effusion. Right lung demonstrates the large centrally necrotic and partially cavitary mass lesion along the posterior aspect of the right upper lobe bowing the major fissure similar to that noted on the prior exam. It measures approximately 6.7 x 6.3 cm in greatest dimension and given the slight variation in the imaging technique is relatively stable. Small right-sided pleural effusion is noted new from the prior CT examination. No other focal parenchymal nodules are seen. Upper Abdomen: A few small peripherally enhancing mass lesions are noted in the right lobe of the liver inferiorly consistent with metastatic disease. Largest of these is noted posteriorly in the right lobe of the liver measuring approximately 3.6 cm and not as well visualized on the prior CT examination. The right adrenal gland is significantly enlarged similar to that noted on prior exam consistent with metastatic disease. No other focal abnormality is noted. Musculoskeletal: There is an expansile lesion of the anterolateral right third rib which was not present on the prior exam from 10/20/2020 consistent with a very aggressive metastatic process. No other rib lesions are seen. Progressive mottled metastatic disease is noted involving T8 and T12 with progressive  compression deformity when compared with the prior CT examination. Mild compression deformity is noted at T4 as well but appears stable from the prior exam. Lytic lesion is noted in the central portion of the left clavicle with some destruction of the cortex medially Review of the MIP images confirms the above findings. IMPRESSION: Changes consistent with mild pulmonary embolism without evidence of right heart strain. These are predominately in the lower lobe branches bilaterally. Large cavitary in centrally necrotic mass lesion in the right upper lobe similar to that seen on prior CT examination consistent with the known history of carcinoma of the lung. Associated mediastinal adenopathy as well as hepatic, adrenal and bony metastatic disease is noted. The hepatic and bony metastatic disease has progressed significantly in the interval from the prior CT. New small right-sided pleural effusion. Aortic dilatation of the ascending aorta as described. Recommend annual imaging followup by CTA or MRA. This recommendation follows 2010 ACCF/AHA/AATS/ACR/ASA/SCA/SCAI/SIR/STS/SVM Guidelines for the Diagnosis and Management of Patients with Thoracic Aortic Disease. Circulation. 2010; 121: J191-Y782. Aortic aneurysm NOS (ICD10-I71.9) Aortic Atherosclerosis (ICD10-I70.0). Critical Value/emergent results were called by telephone at the time of interpretation on 01/01/2021 at 9:56 pm to Dr. Campbell Stall , who verbally acknowledged these results. Electronically Signed   By: Inez Catalina M.D.   On: 01/01/2021 17:31   CT Lumbar Spine Wo  Contrast  Result Date: 01/01/2021 CLINICAL DATA:  Bone neoplasm, lumbosacral spine, recurrent suspected. Metastatic lung cancer. EXAM: CT LUMBAR SPINE WITHOUT CONTRAST TECHNIQUE: Multidetector CT imaging of the lumbar spine was performed without intravenous contrast administration. Multiplanar CT image reconstructions were also generated. COMPARISON:  PET scan 11/09/2020.  CT abdomen 10/20/2020.  FINDINGS: Segmentation: 5 lumbar type vertebral bodies assumed. Alignment: No significant malalignment. Vertebrae: Previously seen inferior endplate fracture at N98 has progressed slightly since the study of 10/21/2018. Vertebral body height anteriorly now measures 13 mm compared with 16 mm on the previous study. Mild superior endplate depression now present as well. Posterior bowing of the posterior margin of the vertebral body. Relative lytic change of the vertebral body does raise suspicion that this could be a pathologic fracture. MRI with and without contrast suggested when able. Paraspinal and other soft tissues: See results abdominal CT. Disc levels: Mild encroachment upon the canal at the T12 level but without definite neural compression. No significant disc space pathology at L2-3 or above. L3-4: Disc bulge. Mild facet hypertrophy. Mild bilateral lateral recess stenosis. L4-5: Endplate osteophytes. Disc bulge. Facet and ligamentous hypertrophy. Moderate multifactorial spinal stenosis that could cause neural compression. L5-S1: Disc bulge. Facet degeneration. No likely neural compression. IMPRESSION: Slight progression of a compression fracture at T12 since the previous CT study of 10/20/2020. Some lytic change of the posterior vertebral body. This could be a pathologic fracture. MRI with contrast recommended when able. Mild encroachment upon the canal but no definite compressive stenosis. L4-5: Moderate multifactorial spinal stenosis that could cause neural compression. Degenerative disc disease and degenerative facet disease at other levels throughout the lumbar region as described above which could be associated with regional back pain. Electronically Signed   By: Nelson Chimes M.D.   On: 01/01/2021 16:34   DG Chest Portable 1 View  Result Date: 01/01/2021 CLINICAL DATA:  Shortness of breath. Patient had first chemo treatment for lung cancer few days ago. Difficulty eating and drinking. EXAM: PORTABLE  CHEST 1 VIEW COMPARISON:  December 26, 2020 FINDINGS: Bilateral skin folds are identified. No pneumothorax. Known malignancy in the upper medial right chest. No new nodules, masses, or focal infiltrates. The cardiomediastinal silhouette is stable. IMPRESSION: Known malignancy in the medial right upper lobe. No other interval changes or acute abnormalities. Electronically Signed   By: Dorise Bullion III M.D.   On: 01/01/2021 16:11     Medical Consultants:   None.   Subjective:    Kristi Jennings she relates her pain is controlled still short of breath feels tired and fatigue.  Objective:    Vitals:   01/01/21 2010 01/01/21 2036 01/01/21 2351 01/02/21 0438  BP: (!) 162/83  (!) 155/76 137/75  Pulse: 87  88 73  Resp: 16  14 14   Temp: 98.7 F (37.1 C)  98.9 F (37.2 C) 98.5 F (36.9 C)  TempSrc: Oral  Oral Oral  SpO2: 98% 96% 95% 96%  Weight: 44.4 kg     Height:       SpO2: 96 % O2 Flow Rate (L/min): 2 L/min   Intake/Output Summary (Last 24 hours) at 01/02/2021 0700 Last data filed at 01/02/2021 9211 Gross per 24 hour  Intake 1227.01 ml  Output --  Net 1227.01 ml   Filed Weights   01/01/21 1449 01/01/21 2010  Weight: 40.8 kg 44.4 kg    Exam: General exam: In no acute distress. Respiratory system: Good air movement and clear to auscultation. Cardiovascular system: S1 & S2  heard, RRR. No JVD. Gastrointestinal system: Abdomen is nondistended, soft and nontender.  Extremities: No pedal edema. Skin: No rashes, lesions or ulcers Psychiatry: Judgement and insight appear normal. Mood & affect appropriate.    Data Reviewed:    Labs: Basic Metabolic Panel: Recent Labs  Lab 12/29/20 0832 01/01/21 1518 01/02/21 0342  NA 130* 131* 128*  K 3.8 3.5 3.1*  CL 95* 91* 93*  CO2 24 28 28   GLUCOSE 130* 104* 90  BUN 19 14 10   CREATININE 0.55 0.40* 0.34*  CALCIUM 9.0 8.8* 7.9*  MG  --  2.0  --    GFR Estimated Creatinine Clearance: 43.9 mL/min (A) (by C-G formula based on  SCr of 0.34 mg/dL (L)). Liver Function Tests: Recent Labs  Lab 12/29/20 0832 01/01/21 1518 01/02/21 0342  AST 18 27 31   ALT 14 20 27   ALKPHOS 107 84 69  BILITOT 0.5 0.6 0.6  PROT 6.4* 6.8 5.2*  ALBUMIN 2.8* 3.2* 2.6*   Recent Labs  Lab 01/01/21 1518  LIPASE 22   No results for input(s): AMMONIA in the last 168 hours. Coagulation profile No results for input(s): INR, PROTIME in the last 168 hours. COVID-19 Labs  Recent Labs    01/01/21 1518  DDIMER 3.74*    Lab Results  Component Value Date   SARSCOV2NAA NEGATIVE 01/01/2021    CBC: Recent Labs  Lab 12/29/20 0832 01/01/21 1518 01/02/21 0342  WBC 10.9* 10.8* 7.5  NEUTROABS 8.6* 9.6*  --   HGB 11.2* 12.0 9.7*  HCT 33.1* 38.2 29.9*  MCV 84.7 89.0 89.3  PLT 309 210 146*   Cardiac Enzymes: No results for input(s): CKTOTAL, CKMB, CKMBINDEX, TROPONINI in the last 168 hours. BNP (last 3 results) No results for input(s): PROBNP in the last 8760 hours. CBG: No results for input(s): GLUCAP in the last 168 hours. D-Dimer: Recent Labs    01/01/21 1518  DDIMER 3.74*   Hgb A1c: No results for input(s): HGBA1C in the last 72 hours. Lipid Profile: No results for input(s): CHOL, HDL, LDLCALC, TRIG, CHOLHDL, LDLDIRECT in the last 72 hours. Thyroid function studies: No results for input(s): TSH, T4TOTAL, T3FREE, THYROIDAB in the last 72 hours.  Invalid input(s): FREET3 Anemia work up: No results for input(s): VITAMINB12, FOLATE, FERRITIN, TIBC, IRON, RETICCTPCT in the last 72 hours. Sepsis Labs: Recent Labs  Lab 12/29/20 0832 01/01/21 1518 01/02/21 0342  WBC 10.9* 10.8* 7.5   Microbiology Recent Results (from the past 240 hour(s))  Resp Panel by RT-PCR (Flu A&B, Covid) Nasopharyngeal Swab     Status: None   Collection Time: 01/01/21  3:19 PM   Specimen: Nasopharyngeal Swab; Nasopharyngeal(NP) swabs in vial transport medium  Result Value Ref Range Status   SARS Coronavirus 2 by RT PCR NEGATIVE NEGATIVE  Final    Comment: (NOTE) SARS-CoV-2 target nucleic acids are NOT DETECTED.  The SARS-CoV-2 RNA is generally detectable in upper respiratory specimens during the acute phase of infection. The lowest concentration of SARS-CoV-2 viral copies this assay can detect is 138 copies/mL. A negative result does not preclude SARS-Cov-2 infection and should not be used as the sole basis for treatment or other patient management decisions. A negative result may occur with  improper specimen collection/handling, submission of specimen other than nasopharyngeal swab, presence of viral mutation(s) within the areas targeted by this assay, and inadequate number of viral copies(<138 copies/mL). A negative result must be combined with clinical observations, patient history, and epidemiological information. The expected result is Negative.  Fact Sheet for Patients:  EntrepreneurPulse.com.au  Fact Sheet for Healthcare Providers:  IncredibleEmployment.be  This test is no t yet approved or cleared by the [place] FDA and  has been authorized for detection and/or diagnosis of SARS-CoV-2 by FDA under an Emergency Use Authorization (EUA). This EUA will remain  in effect (meaning this test can be used) for the duration of the COVID-19 declaration under Section 564(b)(1) of the Act, 21 U.S.C.section 360bbb-3(b)(1), unless the authorization is terminated  or revoked sooner.       Influenza A by PCR NEGATIVE NEGATIVE Final   Influenza B by PCR NEGATIVE NEGATIVE Final    Comment: (NOTE) The Xpert Xpress SARS-CoV-2/FLU/RSV plus assay is intended as an aid in the diagnosis of influenza from Nasopharyngeal swab specimens and should not be used as a sole basis for treatment. Nasal washings and aspirates are unacceptable for Xpert Xpress SARS-CoV-2/FLU/RSV testing.  Fact Sheet for Patients: EntrepreneurPulse.com.au  Fact Sheet for Healthcare  Providers: IncredibleEmployment.be  This test is not yet approved or cleared by the [place] FDA and has been authorized for detection and/or diagnosis of SARS-CoV-2 by FDA under an Emergency Use Authorization (EUA). This EUA will remain in effect (meaning this test can be used) for the duration of the COVID-19 declaration under Section 564(b)(1) of the Act, 21 U.S.C. section 360bbb-3(b)(1), unless the authorization is terminated or revoked.  Performed at [facility], [place] [address], [place], [place] [address]      Medications:    albuterol  2.5 mg Nebulization Q6H   aspirin EC  81 mg Oral Once per day on Sun Tue Thu Sat   atenolol  12.5 mg Oral QHS   diclofenac Sodium  4 g Topical QID   escitalopram  20 mg Oral QHS   folic acid  1 mg Oral Daily   megestrol  625 mg Oral Daily   morphine  30 mg Oral Q12H   rosuvastatin  10 mg Oral QHS   Continuous Infusions:  sodium chloride 100 mL/hr at 01/02/21 0312   heparin 900 Units/hr (01/02/21 0448)      LOS: 1 day   Charlynne Cousins  [facility]  01/02/2021, 7:00 AM

## 2021-01-02 NOTE — Progress Notes (Signed)
ANTICOAGULATION CONSULT NOTE - follow up  Pharmacy Consult for heparin Indication: pulmonary embolus  Allergies  Allergen Reactions   Shellfish-Derived Products Hives, Itching and Rash   Augmentin [Amoxicillin-Pot Clavulanate] Nausea And Vomiting    Patient Measurements: Height: 5\' 5"  (165.1 cm) Weight: 44.4 kg (97 lb 14.2 oz) IBW/kg (Calculated) : 57 Heparin Dosing Weight: 40.8 kg TBW  Vital Signs: Temp: 98.9 F (37.2 C) (08/13 2351) Temp Source: Oral (08/13 2351) BP: 155/76 (08/13 2351) Pulse Rate: 88 (08/13 2351)  Labs: Recent Labs    01/01/21 1518 01/01/21 1726 01/02/21 0342  HGB 12.0  --  9.7*  HCT 38.2  --  29.9*  PLT 210  --  146*  HEPARINUNFRC  --   --  <0.10*  CREATININE 0.40*  --   --   TROPONINIHS 30* 32*  --      Estimated Creatinine Clearance: 43.9 mL/min (A) (by C-G formula based on SCr of 0.4 mg/dL (L)).   Medical History: Past Medical History:  Diagnosis Date   Borderline diabetes mellitus    Bronchogenic cancer of right lung (HCC)    Dyslipidemia    Hypertension    Polymyalgia rheumatica (HCC)     Medications:  no hx anticoagulants  Assessment: 75 yo F with PE to start heparin per pharmacy.    CTA: "Changes consistent with mild pulmonary embolism without evidence of right heart strain. These are predominately in the lower lobe branches bilaterally"  01/02/2021 HL <0.1 subtherapeutic on 750 units/hr Hgb down to 9.7, plts down to 146 Per RN no bleeding or line interruptions  Goal of Therapy:  Heparin level 0.3-0.7 units/ml Monitor platelets by anticoagulation protocol: Yes   Plan:  Heparin 1500 unit bolus IV x 1 Increase Heparin drip to 900 units/hr Check 8 hr heparin level Daily CBC & heparin level  Dolly Rias RPh 01/02/2021, 4:36 AM

## 2021-01-03 ENCOUNTER — Inpatient Hospital Stay (HOSPITAL_COMMUNITY): Payer: Medicare HMO

## 2021-01-03 LAB — CBC
HCT: 32.1 % — ABNORMAL LOW (ref 36.0–46.0)
Hemoglobin: 10.1 g/dL — ABNORMAL LOW (ref 12.0–15.0)
MCH: 28.5 pg (ref 26.0–34.0)
MCHC: 31.5 g/dL (ref 30.0–36.0)
MCV: 90.7 fL (ref 80.0–100.0)
Platelets: 142 10*3/uL — ABNORMAL LOW (ref 150–400)
RBC: 3.54 MIL/uL — ABNORMAL LOW (ref 3.87–5.11)
RDW: 14.2 % (ref 11.5–15.5)
WBC: 6.9 10*3/uL (ref 4.0–10.5)
nRBC: 0 % (ref 0.0–0.2)

## 2021-01-03 LAB — BASIC METABOLIC PANEL
Anion gap: 5 (ref 5–15)
BUN: 7 mg/dL — ABNORMAL LOW (ref 8–23)
CO2: 27 mmol/L (ref 22–32)
Calcium: 8 mg/dL — ABNORMAL LOW (ref 8.9–10.3)
Chloride: 98 mmol/L (ref 98–111)
Creatinine, Ser: <0.3 mg/dL — ABNORMAL LOW (ref 0.44–1.00)
Glucose, Bld: 100 mg/dL — ABNORMAL HIGH (ref 70–99)
Potassium: 4.2 mmol/L (ref 3.5–5.1)
Sodium: 130 mmol/L — ABNORMAL LOW (ref 135–145)

## 2021-01-03 LAB — HEPARIN LEVEL (UNFRACTIONATED)
Heparin Unfractionated: <0.1 IU/mL — ABNORMAL LOW (ref 0.30–0.70)
Heparin Unfractionated: 0.16 IU/mL — ABNORMAL LOW (ref 0.30–0.70)

## 2021-01-03 MED ORDER — HYDROMORPHONE HCL 1 MG/ML IJ SOLN
1.0000 mg | INTRAMUSCULAR | Status: DC | PRN
  Administered 2021-01-03 – 2021-01-06 (×8): 1 mg via INTRAVENOUS
  Filled 2021-01-03 (×8): qty 1

## 2021-01-03 MED ORDER — OXYCODONE HCL 5 MG PO TABS
10.0000 mg | ORAL_TABLET | ORAL | Status: DC | PRN
  Administered 2021-01-03 – 2021-01-04 (×4): 10 mg via ORAL
  Filled 2021-01-03 (×4): qty 2

## 2021-01-03 MED ORDER — POLYETHYLENE GLYCOL 3350 17 G PO PACK
17.0000 g | PACK | Freq: Two times a day (BID) | ORAL | Status: AC
  Administered 2021-01-03 (×2): 17 g via ORAL
  Filled 2021-01-03 (×2): qty 1

## 2021-01-03 MED ORDER — GADOBUTROL 1 MMOL/ML IV SOLN
4.0000 mL | Freq: Once | INTRAVENOUS | Status: AC | PRN
  Administered 2021-01-03: 4 mL via INTRAVENOUS

## 2021-01-03 MED ORDER — APIXABAN 5 MG PO TABS
10.0000 mg | ORAL_TABLET | Freq: Two times a day (BID) | ORAL | Status: DC
  Administered 2021-01-03 – 2021-01-06 (×7): 10 mg via ORAL
  Filled 2021-01-03 (×7): qty 2

## 2021-01-03 MED ORDER — APIXABAN 5 MG PO TABS: 5.0000 mg | ORAL_TABLET | Freq: Two times a day (BID) | ORAL | Status: DC

## 2021-01-03 MED ORDER — HEPARIN BOLUS VIA INFUSION
1500.0000 [IU] | Freq: Once | INTRAVENOUS | Status: AC
  Administered 2021-01-03: 1500 [IU] via INTRAVENOUS
  Filled 2021-01-03: qty 1500

## 2021-01-03 NOTE — Progress Notes (Signed)
ANTICOAGULATION CONSULT NOTE - follow up  Pharmacy Consult for heparin >> apixaban Indication: pulmonary embolus  Allergies  Allergen Reactions   Shellfish-Derived Products Hives, Itching and Rash   Augmentin [Amoxicillin-Pot Clavulanate] Nausea And Vomiting    Patient Measurements: Height: 5\' 5"  (165.1 cm) Weight: 44.4 kg (97 lb 14.2 oz) IBW/kg (Calculated) : 57 Heparin Dosing Weight: n/a. Use TBW = 44 kg  Vital Signs: Temp: 98.4 F (36.9 C) (08/15 0508) Temp Source: Oral (08/15 0508) BP: 157/79 (08/15 0508) Pulse Rate: 79 (08/15 0508)  Labs: Recent Labs    01/01/21 1518 01/01/21 1518 01/01/21 1726 01/02/21 0342 01/02/21 1313 01/02/21 2308 01/03/21 0450 01/03/21 0837  HGB 12.0  --   --  9.7*  --   --  10.1*  --   HCT 38.2  --   --  29.9*  --   --  32.1*  --   PLT 210  --   --  146*  --   --  142*  --   HEPARINUNFRC  --    < >  --  <0.10* <0.10* <0.10*  --  0.16*  CREATININE 0.40*  --   --  0.34*  --   --  <0.30*  --   TROPONINIHS 30*  --  32*  --   --   --   --   --    < > = values in this interval not displayed.     CrCl cannot be calculated (This lab value cannot be used to calculate CrCl because it is not a number: <0.30).   Medical History: Past Medical History:  Diagnosis Date   Borderline diabetes mellitus    Bronchogenic cancer of right lung (HCC)    Dyslipidemia    Hypertension    Polymyalgia rheumatica (HCC)     Medications: No anticoagulants PTA  Assessment: Pt is a 20 yoF presenting with SOB and chest pain. PMH significant for lung cancer on palliative radiation and chemotherapy (last dose of chemo on 12/29/20). CTA revealed bilateral PE without evidence of right heart strain. Pharmacy initially consulted to dose heparin, will transition to apixaban today.   Today, 01/03/21 HL = 0.16 remains subtherapeutic despite continuing to increase rate of heparin infusion. Currently infusing at 1150 units/hr (~26 units/kg/hr) CBC: Hgb and Plt both low  but stable. Of note, pt received chemotherapy on 8/10 SCr low, CrCl ~35 mL/min  Discussed with MD - since heparin level remains subtherapeutic despite high infusion rate, will transition to apixaban for treatment of PE.   Plan:  Discontinue heparin drip Apixaban 10 mg PO BID x7 days followed by 5 mg PO BID Monitor CBC, watch for signs of bleeding Pt will need manufacturer coupon and education prior to discharge  Lenis Noon, PharmD 01/03/21 10:20 AM

## 2021-01-03 NOTE — Progress Notes (Signed)
[facility] PROGRESS NOTE    Progress Note  Kristi Jennings  SVX:[id number] DOB: 02-13-1946 DOA: 01/01/2021 PCP: [name], MD     Brief Narrative:   Kristi Jennings is an 75 y.o. female past medical history significant for lung cancer of the right lung polymyalgia rheumatica, diabetes mellitus type 2 on palliative radiation was started 2 weeks prior to admission comes in with d shortness of breath, chest pain accompanied by weakness.  CT angio in the ED was done that showed pulmonary embolism with no right heart strain.   Assessment/Plan:   Acute bilateral Pulmonary emboli ([place]): Continue oxygen supplementation to keep saturations greater 90%. Started on IV heparin, in the setting of lung cancer. There is a mild drop in hemoglobin from 11-10 continue to monitor.  Hypokalemia: Repleted orally now resolved.  Hypo-volemic hyponatremia: Continues to improve with IV fluid hydration.  We will continue IV fluids for an additional 24 hours.  Benign essential hypertension Blood pressure is trending up we will resume home regimen.  Pathologic compression fracture of T12: CT of the lumbar spine show slight progression of T12 since previous study in June with lytic lesion, mild encroachment of the canal but now compression stenosis. She relates her pain is not controlled go ahead and increase her Oxy, started on MiraLAX p.o. twice daily.  Depression/anxiety: Continue Lexapro.  Hyperlipidemia: Continue Crestor.  Polymyalgia rheumatica (HCC) Noted not on steroids.  Non-small cell carcinoma of lung, stage 4, right ([place]) Suspect squamous as he presented with a cavitary lesion with mediastinal lymphadenopathy and metastatic disease to the bone as well as the right adrenal. She was started on chemotherapy last month which is every 3 weeks. MRI of the C-spine still pending. This is an advanced disease we will have to consult palliative care to address goals of care. Continue MS  Contin and Norco for breakthrough. She is lost significant weight over the last several weeks.  Right shoulder pain: We will get a shoulder x-ray.    DVT prophylaxis: heparin Family Communication:husband Status is: Inpatient  Remains inpatient appropriate because:Hemodynamically unstable  Dispo: The patient is from: Home              Anticipated d/c is to: Home              Patient currently is not medically stable to d/c.   Difficult to place patient No    Code Status:     Code Status Orders  (From admission, onward)           Start     Ordered   01/01/21 2025  Full code  Continuous        01/01/21 2024           Code Status History     This patient has a current code status but no historical code status.         IV Access:   Peripheral IV   Procedures and diagnostic studies:   CT Angio Chest PE W and/or Wo Contrast  Result Date: 01/01/2021 CLINICAL DATA:  History of lung carcinoma and recent bronchoscopy with shortness of breath EXAM: CT ANGIOGRAPHY CHEST WITH CONTRAST TECHNIQUE: Multidetector CT imaging of the chest was performed using the standard protocol during bolus administration of intravenous contrast. Multiplanar CT image reconstructions and MIPs were obtained to evaluate the vascular anatomy. CONTRAST:  18mL OMNIPAQUE IOHEXOL 350 MG/ML SOLN COMPARISON:  10/20/2020 CT FINDINGS: Cardiovascular: Thoracic aorta demonstrates normal branching pattern. Atherosclerotic calcifications are  noted without dissection. Aneurysmal dilatation of the ascending aorta is noted to 4.4 cm. Descending thoracic aorta is normal in caliber. No significant cardiac enlargement is noted. No evidence of right heart strain is seen. The pulmonary artery is not as well opacified as the aorta although filling defects are noted in branches of the right lower lobe and left lower lobe. No definitive upper lobe filling defects are seen although the right upper lobe pulmonary arterial  branches are significantly attenuated by the known underlying mass lesion. Mediastinum/Nodes: Thoracic inlet is within normal limits. Known lymphadenopathy in the precarinal and right paratracheal region are less well evaluated on the current exam but appears stable. This is mostly related to the timing of the contrast bolus being arterial in nature. The known right upper lobe mass lesion extends into the superior aspect of the right hilum with likely underlying small confluent lymph nodes present. The esophagus as visualized is within normal limits. Lungs/Pleura: Left lung is well aerated without evidence of focal infiltrate or effusion. Right lung demonstrates the large centrally necrotic and partially cavitary mass lesion along the posterior aspect of the right upper lobe bowing the major fissure similar to that noted on the prior exam. It measures approximately 6.7 x 6.3 cm in greatest dimension and given the slight variation in the imaging technique is relatively stable. Small right-sided pleural effusion is noted new from the prior CT examination. No other focal parenchymal nodules are seen. Upper Abdomen: A few small peripherally enhancing mass lesions are noted in the right lobe of the liver inferiorly consistent with metastatic disease. Largest of these is noted posteriorly in the right lobe of the liver measuring approximately 3.6 cm and not as well visualized on the prior CT examination. The right adrenal gland is significantly enlarged similar to that noted on prior exam consistent with metastatic disease. No other focal abnormality is noted. Musculoskeletal: There is an expansile lesion of the anterolateral right third rib which was not present on the prior exam from 10/20/2020 consistent with a very aggressive metastatic process. No other rib lesions are seen. Progressive mottled metastatic disease is noted involving T8 and T12 with progressive compression deformity when compared with the prior CT  examination. Mild compression deformity is noted at T4 as well but appears stable from the prior exam. Lytic lesion is noted in the central portion of the left clavicle with some destruction of the cortex medially Review of the MIP images confirms the above findings. IMPRESSION: Changes consistent with mild pulmonary embolism without evidence of right heart strain. These are predominately in the lower lobe branches bilaterally. Large cavitary in centrally necrotic mass lesion in the right upper lobe similar to that seen on prior CT examination consistent with the known history of carcinoma of the lung. Associated mediastinal adenopathy as well as hepatic, adrenal and bony metastatic disease is noted. The hepatic and bony metastatic disease has progressed significantly in the interval from the prior CT. New small right-sided pleural effusion. Aortic dilatation of the ascending aorta as described. Recommend annual imaging followup by CTA or MRA. This recommendation follows 2010 ACCF/AHA/AATS/ACR/ASA/SCA/SCAI/SIR/STS/SVM Guidelines for the Diagnosis and Management of Patients with Thoracic Aortic Disease. Circulation. 2010; 121: L381-O175. Aortic aneurysm NOS (ICD10-I71.9) Aortic Atherosclerosis (ICD10-I70.0). Critical Value/emergent results were called by telephone at the time of interpretation on 01/01/2021 at 1:02 pm to Dr. Campbell Stall , who verbally acknowledged these results. Electronically Signed   By: Inez Catalina M.D.   On: 01/01/2021 17:31   CT Lumbar  Spine Wo Contrast  Result Date: 01/01/2021 CLINICAL DATA:  Bone neoplasm, lumbosacral spine, recurrent suspected. Metastatic lung cancer. EXAM: CT LUMBAR SPINE WITHOUT CONTRAST TECHNIQUE: Multidetector CT imaging of the lumbar spine was performed without intravenous contrast administration. Multiplanar CT image reconstructions were also generated. COMPARISON:  PET scan 11/09/2020.  CT abdomen 10/20/2020. FINDINGS: Segmentation: 5 lumbar type vertebral bodies  assumed. Alignment: No significant malalignment. Vertebrae: Previously seen inferior endplate fracture at S28 has progressed slightly since the study of 10/21/2018. Vertebral body height anteriorly now measures 13 mm compared with 16 mm on the previous study. Mild superior endplate depression now present as well. Posterior bowing of the posterior margin of the vertebral body. Relative lytic change of the vertebral body does raise suspicion that this could be a pathologic fracture. MRI with and without contrast suggested when able. Paraspinal and other soft tissues: See results abdominal CT. Disc levels: Mild encroachment upon the canal at the T12 level but without definite neural compression. No significant disc space pathology at L2-3 or above. L3-4: Disc bulge. Mild facet hypertrophy. Mild bilateral lateral recess stenosis. L4-5: Endplate osteophytes. Disc bulge. Facet and ligamentous hypertrophy. Moderate multifactorial spinal stenosis that could cause neural compression. L5-S1: Disc bulge. Facet degeneration. No likely neural compression. IMPRESSION: Slight progression of a compression fracture at T12 since the previous CT study of 10/20/2020. Some lytic change of the posterior vertebral body. This could be a pathologic fracture. MRI with contrast recommended when able. Mild encroachment upon the canal but no definite compressive stenosis. L4-5: Moderate multifactorial spinal stenosis that could cause neural compression. Degenerative disc disease and degenerative facet disease at other levels throughout the lumbar region as described above which could be associated with regional back pain. Electronically Signed   By: Nelson Chimes M.D.   On: 01/01/2021 16:34   DG Chest Portable 1 View  Result Date: 01/01/2021 CLINICAL DATA:  Shortness of breath. Patient had first chemo treatment for lung cancer few days ago. Difficulty eating and drinking. EXAM: PORTABLE CHEST 1 VIEW COMPARISON:  December 26, 2020 FINDINGS:  Bilateral skin folds are identified. No pneumothorax. Known malignancy in the upper medial right chest. No new nodules, masses, or focal infiltrates. The cardiomediastinal silhouette is stable. IMPRESSION: Known malignancy in the medial right upper lobe. No other interval changes or acute abnormalities. Electronically Signed   By: Dorise Bullion III M.D.   On: 01/01/2021 16:11     Medical Consultants:   None.   Subjective:    Kristi Jennings she relates her shortness of breath is about the same, continues to have pain in her neck and right shoulder  Objective:    Vitals:   01/02/21 0820 01/02/21 1400 01/02/21 2109 01/03/21 0508  BP: (!) 165/79 131/68 134/76 (!) 157/79  Pulse: 85 77 73 79  Resp: 16 15 14 16   Temp: 98.4 F (36.9 C) 97.8 F (36.6 C) 98.4 F (36.9 C) 98.4 F (36.9 C)  TempSrc: Oral Oral Oral Oral  SpO2: 93% 99% 99% 96%  Weight:      Height:       SpO2: 96 % O2 Flow Rate (L/min): 2 L/min   Intake/Output Summary (Last 24 hours) at 01/03/2021 1003 Last data filed at 01/03/2021 7681 Gross per 24 hour  Intake 4860.58 ml  Output 1750 ml  Net 3110.58 ml    Filed Weights   01/01/21 1449 01/01/21 2010  Weight: 40.8 kg 44.4 kg    Exam: General exam: In no acute distress. Respiratory system:  Good air movement and clear to auscultation. Cardiovascular system: S1 & S2 heard, RRR. No JVD. Gastrointestinal system: Abdomen is nondistended, soft and nontender.  Extremities: No pedal edema. Skin: No rashes, lesions or ulcers  Data Reviewed:    Labs: Basic Metabolic Panel: Recent Labs  Lab 12/29/20 0832 01/01/21 1518 01/02/21 0342 01/03/21 0450  NA 130* 131* 128* 130*  K 3.8 3.5 3.1* 4.2  CL 95* 91* 93* 98  CO2 24 28 28 27   GLUCOSE 130* 104* 90 100*  BUN 19 14 10  7*  CREATININE 0.55 0.40* 0.34* <0.30*  CALCIUM 9.0 8.8* 7.9* 8.0*  MG  --  2.0  --   --     GFR CrCl cannot be calculated (This lab value cannot be used to calculate CrCl because it is  not a number: <0.30). Liver Function Tests: Recent Labs  Lab 12/29/20 0832 01/01/21 1518 01/02/21 0342  AST 18 27 31   ALT 14 20 27   ALKPHOS 107 84 69  BILITOT 0.5 0.6 0.6  PROT 6.4* 6.8 5.2*  ALBUMIN 2.8* 3.2* 2.6*    Recent Labs  Lab 01/01/21 1518  LIPASE 22    No results for input(s): AMMONIA in the last 168 hours. Coagulation profile No results for input(s): INR, PROTIME in the last 168 hours. COVID-19 Labs  Recent Labs    01/01/21 1518  DDIMER 3.74*     Lab Results  Component Value Date   SARSCOV2NAA NEGATIVE 01/01/2021    CBC: Recent Labs  Lab 12/29/20 0832 01/01/21 1518 01/02/21 0342 01/03/21 0450  WBC 10.9* 10.8* 7.5 6.9  NEUTROABS 8.6* 9.6*  --   --   HGB 11.2* 12.0 9.7* 10.1*  HCT 33.1* 38.2 29.9* 32.1*  MCV 84.7 89.0 89.3 90.7  PLT 309 210 146* 142*    Cardiac Enzymes: No results for input(s): CKTOTAL, CKMB, CKMBINDEX, TROPONINI in the last 168 hours. BNP (last 3 results) No results for input(s): PROBNP in the last 8760 hours. CBG: No results for input(s): GLUCAP in the last 168 hours. D-Dimer: Recent Labs    01/01/21 1518  DDIMER 3.74*    Hgb A1c: No results for input(s): HGBA1C in the last 72 hours. Lipid Profile: No results for input(s): CHOL, HDL, LDLCALC, TRIG, CHOLHDL, LDLDIRECT in the last 72 hours. Thyroid function studies: No results for input(s): TSH, T4TOTAL, T3FREE, THYROIDAB in the last 72 hours.  Invalid input(s): FREET3 Anemia work up: No results for input(s): VITAMINB12, FOLATE, FERRITIN, TIBC, IRON, RETICCTPCT in the last 72 hours. Sepsis Labs: Recent Labs  Lab 12/29/20 0832 01/01/21 1518 01/02/21 0342 01/03/21 0450  WBC 10.9* 10.8* 7.5 6.9    Microbiology Recent Results (from the past 240 hour(s))  Resp Panel by RT-PCR (Flu A&B, Covid) Nasopharyngeal Swab     Status: None   Collection Time: 01/01/21  3:19 PM   Specimen: Nasopharyngeal Swab; Nasopharyngeal(NP) swabs in vial transport medium  Result  Value Ref Range Status   SARS Coronavirus 2 by RT PCR NEGATIVE NEGATIVE Final    Comment: (NOTE) SARS-CoV-2 target nucleic acids are NOT DETECTED.  The SARS-CoV-2 RNA is generally detectable in upper respiratory specimens during the acute phase of infection. The lowest concentration of SARS-CoV-2 viral copies this assay can detect is 138 copies/mL. A negative result does not preclude SARS-Cov-2 infection and should not be used as the sole basis for treatment or other patient management decisions. A negative result may occur with  improper specimen collection/handling, submission of specimen other than nasopharyngeal swab, presence  of viral mutation(s) within the areas targeted by this assay, and inadequate number of viral copies(<138 copies/mL). A negative result must be combined with clinical observations, patient history, and epidemiological information. The expected result is Negative.  Fact Sheet for Patients:  EntrepreneurPulse.com.au  Fact Sheet for Healthcare Providers:  IncredibleEmployment.be  This test is no t yet approved or cleared by the [place] FDA and  has been authorized for detection and/or diagnosis of SARS-CoV-2 by FDA under an Emergency Use Authorization (EUA). This EUA will remain  in effect (meaning this test can be used) for the duration of the COVID-19 declaration under Section 564(b)(1) of the Act, 21 U.S.C.section 360bbb-3(b)(1), unless the authorization is terminated  or revoked sooner.       Influenza A by PCR NEGATIVE NEGATIVE Final   Influenza B by PCR NEGATIVE NEGATIVE Final    Comment: (NOTE) The Xpert Xpress SARS-CoV-2/FLU/RSV plus assay is intended as an aid in the diagnosis of influenza from Nasopharyngeal swab specimens and should not be used as a sole basis for treatment. Nasal washings and aspirates are unacceptable for Xpert Xpress SARS-CoV-2/FLU/RSV testing.  Fact Sheet for  Patients: EntrepreneurPulse.com.au  Fact Sheet for Healthcare Providers: IncredibleEmployment.be  This test is not yet approved or cleared by the [place] FDA and has been authorized for detection and/or diagnosis of SARS-CoV-2 by FDA under an Emergency Use Authorization (EUA). This EUA will remain in effect (meaning this test can be used) for the duration of the COVID-19 declaration under Section 564(b)(1) of the Act, 21 U.S.C. section 360bbb-3(b)(1), unless the authorization is terminated or revoked.  Performed at [facility], [place] [address], [place], [place] [address]      Medications:    aspirin EC  81 mg Oral Once per day on Sun Tue Thu Sat   atenolol  12.5 mg Oral QHS   diclofenac Sodium  4 g Topical QID   escitalopram  20 mg Oral QHS   folic acid  1 mg Oral Daily   morphine  30 mg Oral Q12H   rosuvastatin  10 mg Oral QHS   Continuous Infusions:  sodium chloride Stopped (01/03/21 0618)   heparin 1,150 Units/hr (01/03/21 0032)      LOS: 2 days   Charlynne Cousins  [facility]  01/03/2021, 10:03 AM

## 2021-01-03 NOTE — Progress Notes (Signed)
ANTICOAGULATION CONSULT NOTE - follow up  Pharmacy Consult for heparin Indication: pulmonary embolus  Allergies  Allergen Reactions   Shellfish-Derived Products Hives, Itching and Rash   Augmentin [Amoxicillin-Pot Clavulanate] Nausea And Vomiting    Patient Measurements: Height: 5\' 5"  (165.1 cm) Weight: 44.4 kg (97 lb 14.2 oz) IBW/kg (Calculated) : 57 Heparin Dosing Weight: 40.8 kg TBW  Vital Signs: Temp: 98.4 F (36.9 C) (08/14 2109) Temp Source: Oral (08/14 2109) BP: 134/76 (08/14 2109) Pulse Rate: 73 (08/14 2109)  Labs: Recent Labs    01/01/21 1518 01/01/21 1726 01/02/21 0342 01/02/21 1313 01/02/21 2308  HGB 12.0  --  9.7*  --   --   HCT 38.2  --  29.9*  --   --   PLT 210  --  146*  --   --   HEPARINUNFRC  --   --  <0.10* <0.10* <0.10*  CREATININE 0.40*  --  0.34*  --   --   TROPONINIHS 30* 32*  --   --   --      Estimated Creatinine Clearance: 43.9 mL/min (A) (by C-G formula based on SCr of 0.34 mg/dL (L)).   Medical History: Past Medical History:  Diagnosis Date   Borderline diabetes mellitus    Bronchogenic cancer of right lung (HCC)    Dyslipidemia    Hypertension    Polymyalgia rheumatica (HCC)     Medications:  no hx anticoagulants  Assessment: 75 yo F with PE to start heparin per pharmacy.    CTA: "Changes consistent with mild pulmonary embolism without evidence of right heart strain. These are predominately in the lower lobe branches bilaterally"  01/03/2021 HL <0.1 subtherapeutic again after 1500 units bolus and rate increase to 1050 units/hr -per RN heparin has not been interrupted and no bleeding  Goal of Therapy:  Heparin level 0.3-0.7 units/ml Monitor platelets by anticoagulation protocol: Yes   Plan:  Repeat Heparin 1500 unit bolus IV x 1 Increase Heparin drip to 1150 units/hr (26 units/kg) Check 8 hr heparin level Daily CBC & heparin level F/u transition to oral agent  Dolly Rias RPh 01/03/2021, 12:27 AM

## 2021-01-03 NOTE — Progress Notes (Signed)
PT Cancellation Note  Patient Details Name: Kristi Jennings MRN: [id number] DOB: 11-02-1945   Cancelled Treatment:    Reason Eval/Treat Not Completed: Medical issues which prohibited therapy;Patient at procedure or test/unavailable. Order received. Chart reviewed. Held PT eval this a.m. 2* heparin level not therapeutic and anticoagulant was being switched over from heparin to Eliquis. Also note pt awaiting cervical MRI. Will check back on tomorrow to perform PT eval. Thanks.   [facility]  Office: [phone] Pager: [phone]      ,

## 2021-01-04 ENCOUNTER — Encounter (HOSPITAL_COMMUNITY): Payer: Self-pay | Admitting: Internal Medicine

## 2021-01-04 LAB — CBC
HCT: 31.3 % — ABNORMAL LOW (ref 36.0–46.0)
Hemoglobin: 10 g/dL — ABNORMAL LOW (ref 12.0–15.0)
MCH: 28.6 pg (ref 26.0–34.0)
MCHC: 31.9 g/dL (ref 30.0–36.0)
MCV: 89.4 fL (ref 80.0–100.0)
Platelets: 131 10*3/uL — ABNORMAL LOW (ref 150–400)
RBC: 3.5 MIL/uL — ABNORMAL LOW (ref 3.87–5.11)
RDW: 14.4 % (ref 11.5–15.5)
WBC: 3.2 10*3/uL — ABNORMAL LOW (ref 4.0–10.5)
nRBC: 0 % (ref 0.0–0.2)

## 2021-01-04 MED ORDER — ENSURE ENLIVE PO LIQD
237.0000 mL | Freq: Two times a day (BID) | ORAL | Status: DC
  Administered 2021-01-05 – 2021-01-06 (×3): 237 mL via ORAL

## 2021-01-04 MED ORDER — ALPRAZOLAM 0.5 MG PO TABS
0.5000 mg | ORAL_TABLET | Freq: Three times a day (TID) | ORAL | Status: DC | PRN
  Administered 2021-01-05 – 2021-01-06 (×3): 0.5 mg via ORAL
  Filled 2021-01-04 (×3): qty 1

## 2021-01-04 MED ORDER — DEXAMETHASONE 0.5 MG PO TABS
1.0000 mg | ORAL_TABLET | Freq: Every day | ORAL | Status: DC
  Administered 2021-01-05: 1 mg via ORAL
  Filled 2021-01-04 (×2): qty 2

## 2021-01-04 NOTE — Progress Notes (Signed)
SATURATION QUALIFICATIONS: (This note is used to comply with regulatory documentation for home oxygen)  Patient Saturations on Room Air at Rest = 88%  Patient Saturations on Room Air while Ambulating = 84%  Patient Saturations on 2 Liters of oxygen while Ambulating = 96%  Please briefly explain why patient needs home oxygen: PT would benefit from home O2 to maintain adequate oxygen supply to the body.

## 2021-01-04 NOTE — Progress Notes (Signed)
Orthopedic Tech Progress Note Patient Details:  Kristi Jennings Sep 21, 1945 [id number]  Patient ID: [name], female   DOB: November 19, 1945, 75 y.o.   MRN: [id number]  [name] 01/04/2021, 9:11 AM Right sling applied

## 2021-01-04 NOTE — Progress Notes (Signed)
[facility] PROGRESS NOTE    Progress Note  Kristi Jennings  OZH:[id number] DOB: 06/05/1945 DOA: 01/01/2021 PCP: [name], MD     Brief Narrative:   Kristi Jennings is an 75 y.o. female past medical history significant for lung cancer of the right lung polymyalgia rheumatica, diabetes mellitus type 2 on palliative radiation was started 2 weeks prior to admission comes in with d shortness of breath, chest pain accompanied by weakness.  CT angio in the ED was done that showed pulmonary embolism with no right heart strain.   Assessment/Plan:   Acute bilateral Pulmonary emboli [facility]): Now requiring 3 L of oxygen to keep saturations greater than 90%. Currently on IV heparin, there is acute pulmonary embolism-in the setting of lung cancer. Following has stabilized, she relates no signs of overt bleeding. We will continue IV heparin for now would like her oxygen saturations to stabilize, she does not require oxygen at home. Physical therapy evaluation is pending. Out of bed to chair, recommend incentive spirometry wean to room air.  Hypokalemia: Repleted orally now resolved.  Hypo-volemic hyponatremia: Continues to improve with IV fluid hydration.  We will continue IV fluids for an additional 24 hours.  Benign essential hypertension Blood pressure is trending up we will resume home regimen.  Pathologic compression fracture of T12: CT of the lumbar spine show slight progression of T12 since previous study in June with lytic lesion, mild encroachment of the canal but now compression stenosis. She relates her pain is not controlled go ahead and increase her Oxy, started on MiraLAX p.o. twice daily.  Depression/anxiety: Continue Lexapro.  Hyperlipidemia: Continue Crestor.  Polymyalgia rheumatica (HCC) Noted not on steroids.  Non-small cell carcinoma of lung, stage 4, right ([place]) Suspect squamous as he presented with a cavitary lesion with mediastinal lymphadenopathy and  metastatic disease to the bone as well as the right adrenal. She was started on chemotherapy last month which is every 3 weeks. MRI of the C-spine still pending. This is an advanced disease we will have to consult palliative care to address goals of care. Continue MS Contin and Norco for breakthrough. She is lost significant weight over the last several weeks.  New right rotator cuff injury: X-ray of the right shoulder show a high rising humerus we will place a sling will need to follow-up with orthopedic surgery as an outpatient.    DVT prophylaxis: heparin Family Communication:husband Status is: Inpatient  Remains inpatient appropriate because:Hemodynamically unstable  Dispo: The patient is from: Home              Anticipated d/c is to: Home              Patient currently is not medically stable to d/c.   Difficult to place patient No    Code Status:     Code Status Orders  (From admission, onward)           Start     Ordered   01/01/21 2025  Full code  Continuous        01/01/21 2024           Code Status History     This patient has a current code status but no historical code status.         IV Access:   Peripheral IV   Procedures and diagnostic studies:   DG Shoulder Right  Result Date: 01/03/2021 CLINICAL DATA:  Right shoulder pain for 2 days EXAM: RIGHT SHOULDER - 2+ VIEW  COMPARISON:  None. FINDINGS: There is no acute fracture or dislocation. There is mild irregularity of the greater tuberosity. The humeral head is somewhat high-riding within the glenoid. There is mild glenohumeral and acromioclavicular joint space narrowing. There is associated osteophytosis about the acromioclavicular joint. IMPRESSION: 1. No acute fracture or dislocation. 2. Irregularity of the greater tuberosity and high-riding humeral head suggesting rotator cuff pathology. Electronically Signed   By: Valetta Mole M.D.   On: 01/03/2021 13:18   MR CERVICAL SPINE W WO  CONTRAST  Result Date: 01/03/2021 CLINICAL DATA:  Acute neck pain EXAM: MRI CERVICAL SPINE WITHOUT AND WITH CONTRAST TECHNIQUE: Multiplanar and multiecho pulse sequences of the cervical spine, to include the craniocervical junction and cervicothoracic junction, were obtained without and with intravenous contrast. CONTRAST:  54mL GADAVIST GADOBUTROL 1 MMOL/ML IV SOLN COMPARISON:  None. FINDINGS: Alignment: Reversal of normal cervical lordosis. There is grade 1 anterolisthesis at C4-5 and grade 1 retrolisthesis at C5-6 and C6-7. Vertebrae: C5-6 bone marrow T1-weighted signal abnormality, favored to be degenerative. This signal of the disc is normal. There is minimal edema. No mass or evidence of discitis-osteomyelitis. Cord: Normal signal and morphology. Posterior Fossa, vertebral arteries, paraspinal tissues: Small prevertebral effusion. Disc levels: C1-2: Unremarkable. C2-3: Disc desiccation and mild facet hypertrophy. There is no spinal canal stenosis. No neural foraminal stenosis. C3-4: Disc desiccation with small bulge and right-greater-than-left uncovertebral osteophytes. There is no spinal canal stenosis. Mild bilateral neural foraminal stenosis. C4-5: Right subarticular disc osteophyte complex. Mild spinal canal stenosis. Moderate right and mild left neural foraminal stenosis. C5-6: Large C5 inferior endplate osteophyte and central disc extrusion with inferior migration. Severe spinal canal stenosis. There is compression of the spinal cord without signal change. Severe bilateral neural foraminal stenosis. C6-7: Intermediate sized disc bulge with endplate spurring. Moderate spinal canal stenosis. Severe bilateral neural foraminal stenosis. C7-T1: Normal disc space and facet joints. There is no spinal canal stenosis. No neural foraminal stenosis. IMPRESSION: 1. Large C5-6 inferior endplate osteophyte and central disc extrusion with inferior migration causes severe spinal canal stenosis and compression of the  spinal cord without signal change. 2. Moderate spinal canal stenosis and severe bilateral neural foraminal stenosis at C6-7. 3. Moderate right C4-5 neural foraminal stenosis. 4. C5-6 bone marrow signal changes favored to be degenerative given normal disc signal. 5. Small nonspecific prevertebral effusion. Critical Value/emergent results were called by telephone at the time of interpretation on 01/03/2021 at 7:59 pm to provider Gershon Cull, who verbally acknowledged these results. Electronically Signed   By: Ulyses Jarred M.D.   On: 01/03/2021 20:02     Medical Consultants:   None.   Subjective:    TASHAWN GREFF have no new complaints  Objective:    Vitals:   01/03/21 1428 01/03/21 1807 01/03/21 2051 01/04/21 0529  BP: (!) 171/88  (!) 168/79 (!) 118/59  Pulse: 86 83 80 67  Resp:   17 17  Temp:   98 F (36.7 C) 97.9 F (36.6 C)  TempSrc:   Oral Oral  SpO2:  97% 99% 100%  Weight:      Height:       SpO2: 100 % O2 Flow Rate (L/min): 3 L/min   Intake/Output Summary (Last 24 hours) at 01/04/2021 0815 Last data filed at 01/03/2021 1812 Gross per 24 hour  Intake 0 ml  Output 1100 ml  Net -1100 ml    Filed Weights   01/01/21 1449 01/01/21 2010  Weight: 40.8 kg 44.4 kg  Exam: General exam: In no acute distress. Respiratory system: Good air movement and clear to auscultation. Cardiovascular system: S1 & S2 heard, RRR. No JVD. Gastrointestinal system: Abdomen is nondistended, soft and nontender.  Extremities: No pedal edema. Skin: No rashes, lesions or ulcers  Data Reviewed:    Labs: Basic Metabolic Panel: Recent Labs  Lab 12/29/20 0832 01/01/21 1518 01/02/21 0342 01/03/21 0450  NA 130* 131* 128* 130*  K 3.8 3.5 3.1* 4.2  CL 95* 91* 93* 98  CO2 24 28 28 27   GLUCOSE 130* 104* 90 100*  BUN 19 14 10  7*  CREATININE 0.55 0.40* 0.34* <0.30*  CALCIUM 9.0 8.8* 7.9* 8.0*  MG  --  2.0  --   --     GFR CrCl cannot be calculated (This lab value cannot be used to  calculate CrCl because it is not a number: <0.30). Liver Function Tests: Recent Labs  Lab 12/29/20 0832 01/01/21 1518 01/02/21 0342  AST 18 27 31   ALT 14 20 27   ALKPHOS 107 84 69  BILITOT 0.5 0.6 0.6  PROT 6.4* 6.8 5.2*  ALBUMIN 2.8* 3.2* 2.6*    Recent Labs  Lab 01/01/21 1518  LIPASE 22    No results for input(s): AMMONIA in the last 168 hours. Coagulation profile No results for input(s): INR, PROTIME in the last 168 hours. COVID-19 Labs  Recent Labs    01/01/21 1518  DDIMER 3.74*     Lab Results  Component Value Date   SARSCOV2NAA NEGATIVE 01/01/2021    CBC: Recent Labs  Lab 12/29/20 0832 01/01/21 1518 01/02/21 0342 01/03/21 0450 01/04/21 0452  WBC 10.9* 10.8* 7.5 6.9 3.2*  NEUTROABS 8.6* 9.6*  --   --   --   HGB 11.2* 12.0 9.7* 10.1* 10.0*  HCT 33.1* 38.2 29.9* 32.1* 31.3*  MCV 84.7 89.0 89.3 90.7 89.4  PLT 309 210 146* 142* 131*    Cardiac Enzymes: No results for input(s): CKTOTAL, CKMB, CKMBINDEX, TROPONINI in the last 168 hours. BNP (last 3 results) No results for input(s): PROBNP in the last 8760 hours. CBG: No results for input(s): GLUCAP in the last 168 hours. D-Dimer: Recent Labs    01/01/21 1518  DDIMER 3.74*    Hgb A1c: No results for input(s): HGBA1C in the last 72 hours. Lipid Profile: No results for input(s): CHOL, HDL, LDLCALC, TRIG, CHOLHDL, LDLDIRECT in the last 72 hours. Thyroid function studies: No results for input(s): TSH, T4TOTAL, T3FREE, THYROIDAB in the last 72 hours.  Invalid input(s): FREET3 Anemia work up: No results for input(s): VITAMINB12, FOLATE, FERRITIN, TIBC, IRON, RETICCTPCT in the last 72 hours. Sepsis Labs: Recent Labs  Lab 01/01/21 1518 01/02/21 0342 01/03/21 0450 01/04/21 0452  WBC 10.8* 7.5 6.9 3.2*    Microbiology Recent Results (from the past 240 hour(s))  Resp Panel by RT-PCR (Flu A&B, Covid) Nasopharyngeal Swab     Status: None   Collection Time: 01/01/21  3:19 PM   Specimen:  Nasopharyngeal Swab; Nasopharyngeal(NP) swabs in vial transport medium  Result Value Ref Range Status   SARS Coronavirus 2 by RT PCR NEGATIVE NEGATIVE Final    Comment: (NOTE) SARS-CoV-2 target nucleic acids are NOT DETECTED.  The SARS-CoV-2 RNA is generally detectable in upper respiratory specimens during the acute phase of infection. The lowest concentration of SARS-CoV-2 viral copies this assay can detect is 138 copies/mL. A negative result does not preclude SARS-Cov-2 infection and should not be used as the sole basis for treatment or other patient management  decisions. A negative result may occur with  improper specimen collection/handling, submission of specimen other than nasopharyngeal swab, presence of viral mutation(s) within the areas targeted by this assay, and inadequate number of viral copies(<138 copies/mL). A negative result must be combined with clinical observations, patient history, and epidemiological information. The expected result is Negative.  Fact Sheet for Patients:  EntrepreneurPulse.com.au  Fact Sheet for Healthcare Providers:  IncredibleEmployment.be  This test is no t yet approved or cleared by the [place] FDA and  has been authorized for detection and/or diagnosis of SARS-CoV-2 by FDA under an Emergency Use Authorization (EUA). This EUA will remain  in effect (meaning this test can be used) for the duration of the COVID-19 declaration under Section 564(b)(1) of the Act, 21 U.S.C.section 360bbb-3(b)(1), unless the authorization is terminated  or revoked sooner.       Influenza A by PCR NEGATIVE NEGATIVE Final   Influenza B by PCR NEGATIVE NEGATIVE Final    Comment: (NOTE) The Xpert Xpress SARS-CoV-2/FLU/RSV plus assay is intended as an aid in the diagnosis of influenza from Nasopharyngeal swab specimens and should not be used as a sole basis for treatment. Nasal washings and aspirates are unacceptable for  Xpert Xpress SARS-CoV-2/FLU/RSV testing.  Fact Sheet for Patients: EntrepreneurPulse.com.au  Fact Sheet for Healthcare Providers: IncredibleEmployment.be  This test is not yet approved or cleared by the [place] FDA and has been authorized for detection and/or diagnosis of SARS-CoV-2 by FDA under an Emergency Use Authorization (EUA). This EUA will remain in effect (meaning this test can be used) for the duration of the COVID-19 declaration under Section 564(b)(1) of the Act, 21 U.S.C. section 360bbb-3(b)(1), unless the authorization is terminated or revoked.  Performed at [facility], [place] [address] [place], [place] [address]      Medications:    apixaban  10 mg Oral BID   Followed by   Derrill Memo ON 01/10/2021] apixaban  5 mg Oral BID   aspirin EC  81 mg Oral Once per day on Sun Tue Thu Sat   atenolol  12.5 mg Oral QHS   diclofenac Sodium  4 g Topical QID   escitalopram  20 mg Oral QHS   folic acid  1 mg Oral Daily   morphine  30 mg Oral Q12H   rosuvastatin  10 mg Oral QHS   Continuous Infusions:  sodium chloride 100 mL/hr at 01/04/21 0103      LOS: 3 days   Charlynne Cousins  [facility]  01/04/2021, 8:15 AM

## 2021-01-04 NOTE — Evaluation (Addendum)
Physical Therapy Evaluation Patient Details Name: Kristi Jennings MRN: [id number] DOB: 08/12/1945 Today's Date: 01/04/2021   History of Present Illness  75 y.o. female admitted with shortness of breath, chest pain accompanied by weakness.  CT angio in the ED was done that showed B pulmonary embolism. Imaging showed mild progression of T12 pathologic compression fx, no cord compression.   Past medical history significant for lung cancer of the right lung polymyalgia rheumatica, diabetes mellitus type 2 on palliative radiation was started 2 weeks prior to admission.  Clinical Impression  Pt admitted with above diagnosis. Pt ambulated 110' with RW, SaO2 84% on room air walking, 96% on 2L O2 walking with pursed lip breathing.  Pt currently with functional limitations due to the deficits listed below (see PT Problem List). Pt will benefit from skilled PT to increase their independence and safety with mobility to allow discharge to the venue listed below.       Follow Up Recommendations Home health PT    Equipment Recommendations  Other (comment) (rollator (if pt doesn't have one))    Recommendations for Other Services    OT   Precautions / Restrictions Precautions Precautions: Fall Precaution Comments: 1 fall in past year,  slipped on pine needles 6 days ago Required Braces or Orthoses: Sling (texted hospitalist for clarification on criteria for sling) Restrictions Weight Bearing Restrictions: No      Mobility  Bed Mobility Overal bed mobility: Needs Assistance Bed Mobility: Rolling;Sidelying to Sit Rolling: Supervision Sidelying to sit: Supervision       General bed mobility comments: VCs for technique    Transfers Overall transfer level: Needs assistance Equipment used: None Transfers: Sit to/from [facility] Sit to Stand: Min assist Stand pivot transfers: Min guard       General transfer comment: VCs hand placement, min A to power  up/steady  Ambulation/Gait Ambulation/Gait assistance: Supervision Gait Distance (Feet): 110 Feet Assistive device: Rolling walker (2 wheeled) Gait Pattern/deviations: Step-through pattern;Decreased stride length Gait velocity: WFL   General Gait Details: SaO2 84% on room air walking, 96% on 2L O2 walking, 87% room air at rest,  no loss of balance, VCs for pursed lip breathing, 2/4 dyspnea  Stairs            Wheelchair Mobility    Modified Rankin (Stroke Patients Only)       Balance Overall balance assessment: Modified Independent                                           Pertinent Vitals/Pain Pain Assessment: 0-10 Pain Score: 7  Pain Location: L ribs Pain Descriptors / Indicators: Aching Pain Intervention(s): Limited activity within patient's tolerance;Monitored during session;Patient requesting pain meds-RN notified;RN gave pain meds during session;Repositioned    Home Living Family/patient expects to be discharged to:: Private residence Living Arrangements: Spouse/significant other Available Help at Discharge: Family;Available 24 hours/day Type of Home: House Home Access: Stairs to enter   [employer] of Steps: 1 Home Layout: One level Home Equipment: Grab bars - tub/shower;Shower seat;Other (comment) (has a walker, not sure what kind, will check)      Prior Function Level of Independence: Independent               Hand Dominance        Extremity/Trunk Assessment   Upper Extremity Assessment Upper Extremity Assessment: Defer to OT evaluation  Lower Extremity Assessment Lower Extremity Assessment: Overall WFL for tasks assessed    Cervical / Trunk Assessment Cervical / Trunk Assessment: Normal  Communication   Communication: No difficulties  Cognition Arousal/Alertness: Awake/alert Behavior During Therapy: WFL for tasks assessed/performed Overall Cognitive Status: Within Functional Limits for tasks  assessed                                        General Comments      Exercises     Assessment/Plan    PT Assessment Patient needs continued PT services  PT Problem List Decreased mobility;Pain;Decreased activity tolerance       PT Treatment Interventions Gait training;Therapeutic activities;Therapeutic exercise;Patient/family education;Functional mobility training    PT Goals (Current goals can be found in the Care Plan section)  Acute Rehab PT Goals Patient Stated Goal: decrease pain, be more active PT Goal Formulation: With patient Time For Goal Achievement: 01/18/21 Potential to Achieve Goals: Good    Frequency Min 3X/week   Barriers to discharge        Co-evaluation               AM-PAC PT "6 Clicks" Mobility  Outcome Measure Help needed turning from your back to your side while in a flat bed without using bedrails?: A Little Help needed moving from lying on your back to sitting on the side of a flat bed without using bedrails?: A Little Help needed moving to and from a bed to a chair (including a wheelchair)?: A Little Help needed standing up from a chair using your arms (e.g., wheelchair or bedside chair)?: A Little Help needed to walk in hospital room?: A Little Help needed climbing 3-5 steps with a railing? : A Little 6 Click Score: 18    End of Session Equipment Utilized During Treatment: Gait belt;Oxygen Activity Tolerance: Patient tolerated treatment well Patient left: in chair;with call bell/phone within reach;with chair alarm set;with family/visitor present Nurse Communication: Mobility status PT Visit Diagnosis: Difficulty in walking, not elsewhere classified (R26.2);Pain Pain - Right/Left: Right Pain - part of body: Shoulder    Time: 7414-2395 PT Time Calculation (min) (ACUTE ONLY): 46 min   Charges:   PT Evaluation $PT Eval Moderate Complexity: 1 Mod PT Treatments $Gait Training: 8-22 mins $Therapeutic Activity: 8-22  mins        Blondell Reveal Kistler PT 01/04/2021  Acute Rehabilitation Services Pager [phone] Office [phone]

## 2021-01-04 NOTE — Progress Notes (Signed)
Initial Nutrition Assessment  DOCUMENTATION CODES:   Underweight  INTERVENTION:   -Ensure Plus PO BID, each provides 350 kcals and 13g protein  -Magic cup BID with meals, each supplement provides 290 kcal and 9 grams of protein  NUTRITION DIAGNOSIS:   Increased nutrient needs related to cancer and cancer related treatments as evidenced by estimated needs.  GOAL:   Patient will meet greater than or equal to 90% of their needs  MONITOR:   PO intake, Supplement acceptance, Labs, Weight trends, I & O's  REASON FOR ASSESSMENT:   Malnutrition Screening Tool    ASSESSMENT:   75 y.o. female past medical history significant for lung cancer of the right lung polymyalgia rheumatica, diabetes mellitus type 2 on palliative radiation was started 2 weeks prior to admission comes in with d shortness of breath, chest pain accompanied by weakness.  CT angio in the ED was done that showed pulmonary embolism with no right heart strain.  Patient in room, receiving patient care at time of visit.  Per chart review, pt has been undergoing palliative chemotherapy for lung cancer (last 8/10). Has been having poor appetite for a while but worsened since chemotherapy treatment where she was not taking in PO. Was seen by [place] on 8/2 ,she recommended Ensure for pt to drink. Will order here.  Per weight records, pt has lost 22 lbs since 04/29/20 (18% wt loss x 8 months, significant for time frame).  Medications: Folic acid  Labs reviewed: Low Na   NUTRITION - FOCUSED PHYSICAL EXAM:  Unable to complete  Diet Order:   Diet Order             Diet Heart Room service appropriate? Yes; Fluid consistency: Thin  Diet effective now                   EDUCATION NEEDS:   No education needs have been identified at this time  Skin:  Skin Assessment: Reviewed RN Assessment  Last BM:  8/13  Height:   Ht Readings from Last 1 Encounters:  01/01/21 5\' 5"  (1.651 m)    Weight:    Wt Readings from Last 1 Encounters:  01/01/21 44.4 kg    BMI:  Body mass index is 16.29 kg/m.  Estimated Nutritional Needs:   Kcal:  1500-1700  Protein:  70-80g  Fluid:  1.7L/day  Clayton Bibles, MS, RD, LDN Inpatient Clinical Dietitian Contact information available via Amion

## 2021-01-05 ENCOUNTER — Inpatient Hospital Stay: Payer: Medicare HMO

## 2021-01-05 ENCOUNTER — Inpatient Hospital Stay (HOSPITAL_COMMUNITY): Payer: Medicare HMO

## 2021-01-05 LAB — CBC
HCT: 31.6 % — ABNORMAL LOW (ref 36.0–46.0)
Hemoglobin: 10 g/dL — ABNORMAL LOW (ref 12.0–15.0)
MCH: 28.2 pg (ref 26.0–34.0)
MCHC: 31.6 g/dL (ref 30.0–36.0)
MCV: 89 fL (ref 80.0–100.0)
Platelets: 153 10*3/uL (ref 150–400)
RBC: 3.55 MIL/uL — ABNORMAL LOW (ref 3.87–5.11)
RDW: 14.3 % (ref 11.5–15.5)
WBC: 2.7 10*3/uL — ABNORMAL LOW (ref 4.0–10.5)
nRBC: 0 % (ref 0.0–0.2)

## 2021-01-05 LAB — BASIC METABOLIC PANEL
Anion gap: 9 (ref 5–15)
BUN: <5 mg/dL — ABNORMAL LOW (ref 8–23)
CO2: 26 mmol/L (ref 22–32)
Calcium: 7.9 mg/dL — ABNORMAL LOW (ref 8.9–10.3)
Chloride: 94 mmol/L — ABNORMAL LOW (ref 98–111)
Creatinine, Ser: 0.3 mg/dL — ABNORMAL LOW (ref 0.44–1.00)
GFR, Estimated: >60 mL/min (ref >60)
Glucose, Bld: 85 mg/dL (ref 70–99)
Potassium: 3.2 mmol/L — ABNORMAL LOW (ref 3.5–5.1)
Sodium: 129 mmol/L — ABNORMAL LOW (ref 135–145)

## 2021-01-05 LAB — PROCALCITONIN: Procalcitonin: <0.1 ng/mL

## 2021-01-05 LAB — MAGNESIUM: Magnesium: 1.7 mg/dL (ref 1.7–2.4)

## 2021-01-05 NOTE — Progress Notes (Signed)
Occupational Therapy Evaluation  Patient lives at home with spouse and is typically independent with self care tasks, does not use AD. Patient currently with decreased overall strength, activity tolerance and requiring 2L O2 to maintain saturations above 90%. Tried on room air for toilet transfer and sink side exercises however patient desat to 85% and unable to improve above 87% with pursed lip breathing instruction. Patient needing min A and moderate cues for sequencing + body mechanics to safely transfer to bedside commode with poor standing posture. Recommend using walker to maximize stability + safety. Also instruct patient in gentle AAROM exercise towel slides and use of incentive spirometer to improve respiratory status. Patient minimally able to move flow meter despite multiple attempts and cues for technique. Recommend continued acute OT services to maximize patient safety and independence with self care in order to D/C home with family support and [facility] services.    01/05/21 1300  OT Visit Information  Last OT Received On 01/05/21  Assistance Needed +1  History of Present Illness 75 y.o. female admitted with shortness of breath, chest pain accompanied by weakness.  CT angio in the ED was done that showed B pulmonary embolism. Imaging showed mild progression of T12 pathologic compression fx, no cord compression.   Past medical history significant for lung cancer of the right lung polymyalgia rheumatica, diabetes mellitus type 2 on palliative radiation was started 2 weeks prior to admission.  Precautions  Precautions Fall  Precaution Comments 1 fall in past year,  slipped on pine needles 6 days ago  Required Braces or Orthoses Sling  Restrictions  Weight Bearing Restrictions No  Home Living  Family/patient expects to be discharged to: Private residence  Living Arrangements Spouse/significant other  Available Help at Discharge Family;Available 24 hours/day  Type of [place] to enter  Entrance Stairs-Number of Steps 1  Home Layout One level  Bathroom Shower/Tub Walk-in shower  [place] bars - tub/shower;Shower seat;Other (comment) (unsure what kind of walker she has)  Prior Function  Level of Independence Independent  Communication  Communication No difficulties  Pain Assessment  Pain Assessment Faces  Faces Pain Scale 4  Pain Location L lower back  Pain Descriptors / Indicators Aching  Pain Intervention(s) Patient requesting pain meds-RN notified  Cognition  Arousal/Alertness Awake/alert (sleepy at end of session)  Behavior During Therapy [facility] for tasks assessed/performed  Overall Cognitive Status Within Functional Limits for tasks assessed  Upper Extremity Assessment  Upper Extremity Assessment RUE deficits/detail  RUE Deficits / Details due to R rotator cuff pathology patient AROM impaired. able to mobilize elbow, wrist, hand functionally. AAROM tolerates shoulder flexion to ~30 degrees before reporting pain  RUE Unable to fully assess due to pain  Lower Extremity Assessment  Lower Extremity Assessment Defer to PT evaluation  ADL  Overall ADL's  Needs assistance/impaired  Eating/Feeding Independent;Sitting  Eating/Feeding Details (indicate cue type and reason) to drink from cup  Grooming Set up;Sitting  Upper Body Bathing Sitting;Moderate assistance  Lower Body Bathing Moderate assistance;Sitting/lateral leans;Sit to/from stand  Upper Body Dressing  Moderate assistance;Sitting  Upper Body Dressing Details (indicate cue type and reason) due to painful R UE  Lower Body Dressing Moderate assistance;Sitting/lateral leans;Sit to/from stand  Lower Body Dressing Details (indicate cue type and reason) decreased standing balance and tolerance  Toilet Transfer Minimal assistance;Cueing for safety;Cueing for sequencing;Stand-pivot;BSC  Toilet Transfer Details (indicate cue type and reason) poor standing  tolerance, difficulty standing  upright with kyphotic posture, needs cues for hand placement to push from L UE to stand from recliner and bedside commode. is unsteady. fall risk. recommend using walker for increased support  Toileting- Clothing Manipulation and Hygiene Minimal assistance;Sitting/lateral lean;Sit to/from stand;Set up  [place] Manipulation Details (indicate cue type and reason) set up for peri care in sitting  Functional mobility during ADLs Minimal assistance;Cueing for safety;Cueing for sequencing  General ADL Comments patient appears quite fatigued with ADL tasks, poor safety awareness and needing moderate verbal cuing for body mechanics/sequencing. tried room air for session patient desat to 85% and unable to improve to 90% with PLB therefore donned 2L  Bed Mobility  General bed mobility comments in chair  Transfers  Overall transfer level Needs assistance  Equipment used None  Transfers Sit to/from Stand;Stand Pivot Transfers  Sit to Stand Min assist  Stand pivot transfers Min assist  General transfer comment please see toilet transfer  Balance  Overall balance assessment Needs assistance  Sitting-balance support Feet supported  Sitting balance-Leahy Scale Fair  Standing balance support Single extremity supported  Standing balance-Leahy Scale Poor  Exercises  Exercises Other exercises  Other Exercises  Other Exercises Instruct patient in towel slides for gentle AAROM R shoulder, per patient MD wrote on white board to move R UE x20/day to encourage movement and minimize frozen shoulder. educate patient to only do as tolerated and to minimize reps if becomes more sore. Does not report any pain performing x10 towel slides at sink.  Other Exercises Educate patietn in use of incentive spirometer and provide cues for technique. patient minimally able to elevate flow meter above 0. poor expiration  OT - End of Session  Activity Tolerance Patient limited by  fatigue;Patient tolerated treatment well  Patient left in chair;with call bell/phone within reach;with family/visitor present  Nurse Communication Mobility status;Patient requests pain meds  OT Assessment  OT Recommendation/Assessment Patient needs continued OT Services  OT Visit Diagnosis Unsteadiness on feet (R26.81);Other abnormalities of gait and mobility (R26.89);Muscle weakness (generalized) (M62.81);Pain  Pain - Right/Left Right  Pain - part of body Shoulder (back)  OT Problem List Decreased strength;Decreased range of motion;Decreased activity tolerance;Impaired balance (sitting and/or standing);Decreased safety awareness;Pain;Impaired UE functional use;Cardiopulmonary status limiting activity;Decreased knowledge of use of DME or AE  OT Plan  OT Frequency (ACUTE ONLY) Min 2X/week  OT Treatment/Interventions (ACUTE ONLY) Self-care/ADL training;Therapeutic exercise;Energy conservation;DME and/or AE instruction;Therapeutic activities;Patient/family education;Balance training  AM-PAC OT "6 Clicks" Daily Activity Outcome Measure (Version 2)  Help from another person eating meals? 4  Help from another person taking care of personal grooming? 3  Help from another person toileting, which includes using toliet, bedpan, or urinal? 3  Help from another person bathing (including washing, rinsing, drying)? 2  Help from another person to put on and taking off regular upper body clothing? 2  Help from another person to put on and taking off regular lower body clothing? 2  6 Click Score 16  Progressive Mobility  What is the highest level of mobility based on the progressive mobility assessment? Level 4 (Walks with assist in room) - Balance while marching in place and cannot step forward and back - Complete  Mobility Ambulated with assistance in room;Out of bed to chair with meals;Out of bed for toileting  OT Recommendation  Follow Up Recommendations Home health OT;Supervision/Assistance - 24 hour   OT Equipment 3 in 1 bedside commode  Individuals Consulted  Consulted and Agree with Results and Recommendations Patient  Acute Rehab OT Goals  Patient Stated Goal home for her birthday  OT Goal Formulation With patient  Time For Goal Achievement 01/19/21  Potential to Achieve Goals Fair  OT Time Calculation  OT Start Time (ACUTE ONLY) 1224  OT Stop Time (ACUTE ONLY) 1302  OT Time Calculation (min) 38 min  OT General Charges  $OT Visit 1 Visit  OT Evaluation  $OT Eval Low Complexity 1 Low  OT Treatments  $Self Care/Home Management  8-22 mins  $Therapeutic Exercise 8-22 mins  Written Expression  Dominant Hand Right   Delbert Phenix OT OT pager: [phone]

## 2021-01-05 NOTE — Progress Notes (Signed)
PROGRESS NOTE    Kristi Jennings  ZOX:[id number] DOB: November 22, 1945 DOA: 01/01/2021 PCP: [name], MD   Brief Narrative:  Kristi Jennings is an 75 y.o. female past medical history significant for lung cancer of the right lung polymyalgia rheumatica, diabetes mellitus type 2 on palliative radiation was started 2 weeks prior to admission comes in with d shortness of breath, chest pain accompanied by weakness.  CT angio in the ED was done that showed pulmonary embolism with no right heart strain.  Assessment & Plan:   Principal Problem:   Pulmonary emboli (HCC) Active Problems:   Benign essential hypertension   Polymyalgia rheumatica (HCC)   Non-small cell carcinoma of lung, stage 4, right (HCC)   Compression fracture of T12 vertebra (HCC)   Lumbar radiculopathy   Acute hypoxic respiratory failure secondary to acute bilateral Pulmonary emboli ([place]): Continues to improve, still with some shortness of breath.  Now on room air.  Continue Eliquis.  Rhonchi bilaterally with end expiratory wheezes: Suspect pneumonia.  Procalcitonin unremarkable.  Chest x-ray pending.   Hypokalemia:: Down again.  Will replace.   Hypo-volemic hyponatremia:: Not sure if this is acute versus chronic hyponatremia.  Seems to be stable.  Monitor.  Benign essential hypertension: Controlled.  Continue atenolol.  Pathologic compression fracture of T12: CT of the lumbar spine show slight progression of T12 since previous study in June with lytic lesion, mild encroachment of the canal but now compression stenosis. She relates her pain is not controlled go ahead and increase her Oxy, started on MiraLAX p.o. twice daily.   Depression/anxiety: Continue Lexapro.  Hyperlipidemia: Continue Crestor.   Polymyalgia rheumatica (HCC) Noted not on steroids.   Non-small cell carcinoma of lung, stage 4, right ([place]) Suspect squamous as he presented with a cavitary lesion with mediastinal lymphadenopathy and metastatic  disease to the bone as well as the right adrenal. She was started on chemotherapy last month which is every 3 weeks. MRI of the C-spine still pending. This is an advanced disease we will have to consult palliative care to address goals of care.  Pain management per palliative care.   New right rotator cuff injury: X-ray of the right shoulder show a high rising humerus we will place a sling will need to follow-up with orthopedic surgery as an outpatient.  DVT prophylaxis:    Code Status: DNR  Family Communication: None present at bedside.  Plan of care discussed with patient in length and he verbalized understanding and agreed with it.  Status is: Inpatient  Remains inpatient appropriate because:Inpatient level of care appropriate due to severity of illness  Dispo: The patient is from: Home              Anticipated d/c is to: Home              Patient currently is not medically stable to d/c.   Difficult to place patient No        Estimated body mass index is 16.29 kg/m as calculated from the following:   Height as of this encounter: 5\' 5"  (1.651 m).   Weight as of this encounter: 44.4 kg.     Nutritional Assessment: Body mass index is 16.29 kg/m.Marland Kitchen Seen by dietician.  I agree with the assessment and plan as outlined below: Nutrition Status: Nutrition Problem: Increased nutrient needs Etiology: cancer and cancer related treatments Signs/Symptoms: estimated needs Interventions: Ensure Enlive (each supplement provides 350kcal and 20 grams of protein), Magic cup, MVI  .  Skin Assessment: I have examined the patient's skin and I agree with the wound assessment as performed by the wound care RN as outlined below:    Consultants:  Palliative care  Procedures:  None  Antimicrobials:  Anti-infectives (From admission, onward)    None          Subjective: Seen and examined.  Patient complains of not feeling well, generalized weakness.  Some cough and shortness  of breath.  Pain all over the body.  No other complaint.  Objective: Vitals:   01/04/21 1223 01/04/21 2011 01/05/21 0523 01/05/21 1320  BP: (!) 142/69 (!) 146/74 (!) 165/85 122/62  Pulse: 69 90 90 83  Resp: 18 19 20 16   Temp: 97.9 F (36.6 C) 98.6 F (37 C) 97.9 F (36.6 C) 97.8 F (36.6 C)  TempSrc: Oral Oral Oral Oral  SpO2: 99% 95% 94% 95%  Weight:      Height:        Intake/Output Summary (Last 24 hours) at 01/05/2021 1438 Last data filed at 01/05/2021 2536 Gross per 24 hour  Intake 1816.19 ml  Output 400 ml  Net 1416.19 ml   Filed Weights   01/01/21 1449 01/01/21 2010  Weight: 40.8 kg 44.4 kg    Examination:  General exam: Appears calm and comfortable  Respiratory system: Scattered rhonchi with end expiratory wheezes. Respiratory effort normal. Cardiovascular system: S1 & S2 heard, RRR. No JVD, murmurs, rubs, gallops or clicks. No pedal edema. Gastrointestinal system: Abdomen is nondistended, soft and nontender. No organomegaly or masses felt. Normal bowel sounds heard. Central nervous system: Alert and oriented. No focal neurological deficits. Extremities: Symmetric 5 x 5 power. Skin: No rashes, lesions or ulcers Psychiatry: Judgement and insight appear normal. Mood & affect appropriate.    Data Reviewed: I have personally reviewed following labs and imaging studies  CBC: Recent Labs  Lab 01/01/21 1518 01/02/21 0342 01/03/21 0450 01/04/21 0452 01/05/21 0543  WBC 10.8* 7.5 6.9 3.2* 2.7*  NEUTROABS 9.6*  --   --   --   --   HGB 12.0 9.7* 10.1* 10.0* 10.0*  HCT 38.2 29.9* 32.1* 31.3* 31.6*  MCV 89.0 89.3 90.7 89.4 89.0  PLT 210 146* 142* 131* 644   Basic Metabolic Panel: Recent Labs  Lab 01/01/21 1518 01/02/21 0342 01/03/21 0450 01/05/21 0832  NA 131* 128* 130* 129*  K 3.5 3.1* 4.2 3.2*  CL 91* 93* 98 94*  CO2 28 28 27 26   GLUCOSE 104* 90 100* 85  BUN 14 10 7* <5*  CREATININE 0.40* 0.34* <0.30* 0.30*  CALCIUM 8.8* 7.9* 8.0* 7.9*  MG 2.0  --    --  1.7   GFR: Estimated Creatinine Clearance: 43.9 mL/min (A) (by C-G formula based on SCr of 0.3 mg/dL (L)). Liver Function Tests: Recent Labs  Lab 01/01/21 1518 01/02/21 0342  AST 27 31  ALT 20 27  ALKPHOS 84 69  BILITOT 0.6 0.6  PROT 6.8 5.2*  ALBUMIN 3.2* 2.6*   Recent Labs  Lab 01/01/21 1518  LIPASE 22   No results for input(s): AMMONIA in the last 168 hours. Coagulation Profile: No results for input(s): INR, PROTIME in the last 168 hours. Cardiac Enzymes: No results for input(s): CKTOTAL, CKMB, CKMBINDEX, TROPONINI in the last 168 hours. BNP (last 3 results) No results for input(s): PROBNP in the last 8760 hours. HbA1C: No results for input(s): HGBA1C in the last 72 hours. CBG: No results for input(s): GLUCAP in the last 168 hours. Lipid Profile:  No results for input(s): CHOL, HDL, LDLCALC, TRIG, CHOLHDL, LDLDIRECT in the last 72 hours. Thyroid Function Tests: No results for input(s): TSH, T4TOTAL, FREET4, T3FREE, THYROIDAB in the last 72 hours. Anemia Panel: No results for input(s): VITAMINB12, FOLATE, FERRITIN, TIBC, IRON, RETICCTPCT in the last 72 hours. Sepsis Labs: Recent Labs  Lab 01/05/21 0832  PROCALCITON <0.10    Recent Results (from the past 240 hour(s))  Resp Panel by RT-PCR (Flu A&B, Covid) Nasopharyngeal Swab     Status: None   Collection Time: 01/01/21  3:19 PM   Specimen: Nasopharyngeal Swab; Nasopharyngeal(NP) swabs in vial transport medium  Result Value Ref Range Status   SARS Coronavirus 2 by RT PCR NEGATIVE NEGATIVE Final    Comment: (NOTE) SARS-CoV-2 target nucleic acids are NOT DETECTED.  The SARS-CoV-2 RNA is generally detectable in upper respiratory specimens during the acute phase of infection. The lowest concentration of SARS-CoV-2 viral copies this assay can detect is 138 copies/mL. A negative result does not preclude SARS-Cov-2 infection and should not be used as the sole basis for treatment or other patient management  decisions. A negative result may occur with  improper specimen collection/handling, submission of specimen other than nasopharyngeal swab, presence of viral mutation(s) within the areas targeted by this assay, and inadequate number of viral copies(<138 copies/mL). A negative result must be combined with clinical observations, patient history, and epidemiological information. The expected result is Negative.  Fact Sheet for Patients:  EntrepreneurPulse.com.au  Fact Sheet for Healthcare Providers:  IncredibleEmployment.be  This test is no t yet approved or cleared by the [place] FDA and  has been authorized for detection and/or diagnosis of SARS-CoV-2 by FDA under an Emergency Use Authorization (EUA). This EUA will remain  in effect (meaning this test can be used) for the duration of the COVID-19 declaration under Section 564(b)(1) of the Act, 21 U.S.C.section 360bbb-3(b)(1), unless the authorization is terminated  or revoked sooner.       Influenza A by PCR NEGATIVE NEGATIVE Final   Influenza B by PCR NEGATIVE NEGATIVE Final    Comment: (NOTE) The Xpert Xpress SARS-CoV-2/FLU/RSV plus assay is intended as an aid in the diagnosis of influenza from Nasopharyngeal swab specimens and should not be used as a sole basis for treatment. Nasal washings and aspirates are unacceptable for Xpert Xpress SARS-CoV-2/FLU/RSV testing.  Fact Sheet for Patients: EntrepreneurPulse.com.au  Fact Sheet for Healthcare Providers: IncredibleEmployment.be  This test is not yet approved or cleared by the [place] FDA and has been authorized for detection and/or diagnosis of SARS-CoV-2 by FDA under an Emergency Use Authorization (EUA). This EUA will remain in effect (meaning this test can be used) for the duration of the COVID-19 declaration under Section 564(b)(1) of the Act, 21 U.S.C. section 360bbb-3(b)(1), unless the  authorization is terminated or revoked.  Performed at [facility], [place] [address], [place], [place] [address]       Radiology Studies: MR CERVICAL SPINE W WO CONTRAST  Result Date: 01/03/2021 CLINICAL DATA:  Acute neck pain EXAM: MRI CERVICAL SPINE WITHOUT AND WITH CONTRAST TECHNIQUE: Multiplanar and multiecho pulse sequences of the cervical spine, to include the craniocervical junction and cervicothoracic junction, were obtained without and with intravenous contrast. CONTRAST:  24mL GADAVIST GADOBUTROL 1 MMOL/ML IV SOLN COMPARISON:  None. FINDINGS: Alignment: Reversal of normal cervical lordosis. There is grade 1 anterolisthesis at C4-5 and grade 1 retrolisthesis at C5-6 and C6-7. Vertebrae: C5-6 bone marrow T1-weighted signal abnormality, favored to be degenerative. This signal  of the disc is normal. There is minimal edema. No mass or evidence of discitis-osteomyelitis. Cord: Normal signal and morphology. Posterior Fossa, vertebral arteries, paraspinal tissues: Small prevertebral effusion. Disc levels: C1-2: Unremarkable. C2-3: Disc desiccation and mild facet hypertrophy. There is no spinal canal stenosis. No neural foraminal stenosis. C3-4: Disc desiccation with small bulge and right-greater-than-left uncovertebral osteophytes. There is no spinal canal stenosis. Mild bilateral neural foraminal stenosis. C4-5: Right subarticular disc osteophyte complex. Mild spinal canal stenosis. Moderate right and mild left neural foraminal stenosis. C5-6: Large C5 inferior endplate osteophyte and central disc extrusion with inferior migration. Severe spinal canal stenosis. There is compression of the spinal cord without signal change. Severe bilateral neural foraminal stenosis. C6-7: Intermediate sized disc bulge with endplate spurring. Moderate spinal canal stenosis. Severe bilateral neural foraminal stenosis. C7-T1: Normal disc space and facet joints. There is no spinal canal stenosis. No neural  foraminal stenosis. IMPRESSION: 1. Large C5-6 inferior endplate osteophyte and central disc extrusion with inferior migration causes severe spinal canal stenosis and compression of the spinal cord without signal change. 2. Moderate spinal canal stenosis and severe bilateral neural foraminal stenosis at C6-7. 3. Moderate right C4-5 neural foraminal stenosis. 4. C5-6 bone marrow signal changes favored to be degenerative given normal disc signal. 5. Small nonspecific prevertebral effusion. Critical Value/emergent results were called by telephone at the time of interpretation on 01/03/2021 at 7:59 pm to provider Gershon Cull, who verbally acknowledged these results. Electronically Signed   By: Ulyses Jarred M.D.   On: 01/03/2021 20:02    Scheduled Meds:  apixaban  10 mg Oral BID   Followed by   Derrill Memo ON 01/10/2021] apixaban  5 mg Oral BID   aspirin EC  81 mg Oral Once per day on Sun Tue Thu Sat   atenolol  12.5 mg Oral QHS   dexamethasone  1 mg Oral Daily   diclofenac Sodium  4 g Topical QID   escitalopram  20 mg Oral QHS   feeding supplement  237 mL Oral BID BM   folic acid  1 mg Oral Daily   morphine  30 mg Oral Q12H   rosuvastatin  10 mg Oral QHS   Continuous Infusions:  sodium chloride 100 mL/hr at 01/05/21 1021     LOS: 4 days   Time spent: 36 minutes   Darliss Cheney, MD [facility]  01/05/2021, 2:38 PM   How to contact the [facility] Attending or Consulting provider [place] or covering provider during after hours [place], for this patient?  Check the care team in [facility] and look for a) attending/consulting TR[facility] provider listed and b) the [facility] team listed. Page or secure chat [phone]. Log into [email or web address] and use [place]'s universal password to access. If you do not have the password, please contact the hospital operator. Locate the [facility] provider you are looking for under [facility] and page to a number that you can be directly reached. If you still have difficulty reaching the  provider, please page the [facility] (Director on Call) for the Hospitalists listed on amion for assistance.

## 2021-01-05 NOTE — Progress Notes (Signed)
Mobility Specialist - Progress Note    01/05/21 1018  Mobility  Activity Ambulated in hall  Level of Assistance Minimal assist, patient does 75% or more  Assistive Device Front wheel walker  Distance Ambulated (ft) 75 ft  Mobility Ambulated with assistance in hallway  Mobility Response Tolerated well  Mobility performed by Mobility specialist  $Mobility charge 1 Mobility    During mobility: 88 HR, 94%  SpO2 Post-mobility: 86 HR, 97% SPO2  Pt ambulated ~75 ft in hallway using RW and on 2L of O2. Pt required 4 standing rest breaks to practice pursed breathing and reported feeling tired and fatigued during return trip. Pt required her recliner to roll back to room and was left in recliner after session with call bell at side.

## 2021-01-05 NOTE — Consult Note (Signed)
Palliative Care  Consultation Note Reason for Consultation: Pain and Symptom Management  Kristi Jennings is a 75 yo woman with NSCLC diagnosed 10/2020, started treatment on 8/10 and received carboplatin, alitma and [place] and was admitted on 8/13 with chest pain, hypoxia, dyspnea and weakness. She has had weight loss and a poor appetite for several months. Imaging showed bilateral pulmonary emboli. Additionally imaging showed T12 compression fracture and L-Spine bone mets. Pain has been a challenge.  I introduced the concept of palliative care within the context of her new lung cancer diagnosis as a way to support her through her treatment and in defining her goals of care including pain and symptom management.  When I arrived at bedside she was emotionally upset and having dyspnea and anxiety. RN administered IV hydromorphone and this significantly improved her symptoms. Kristi Jennings was able to talk to me more about her condition. She is still very much processing her diagnosis and coming to terms with what this means for her future. She is grieving the loss of her health and trying to cope with her diagnosis. She doesn't really know what to expect and that is hard for her-she was very pleased with how she tolerated chemo so this set back is disappointing.  Kristi Jennings describes a wonderful support system-including her husband and many good friends- she reflects on "how blessed" she is and grateful for that. 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  We discussed her Advance Care Planning- she has a living will and is clear that in the setting of terminal illness that she would not want to be on life support or receive CPR. I recommended DNR status in setting of metastatic cancer.  At present her pain is mostly controlled-but needing IV prn frequently. Pain is in her back, worse with movement.  Recommendations:  DNR, Full scope treatment Cancer Related  Pain: MS Contin 30mg  BID Continue IV hydromorphone-will increased long avcting and breakthrough based on 24 hour use. Start low dose decadron for inflammatory pain Calcitonin for compression fracture pain if needed I will follow outpatient. Appetite: Decadron Start Mirtazapine 4. Home Based [place] [facility] Referral  Lane Hacker, DO Palliative Medicine

## 2021-01-06 ENCOUNTER — Other Ambulatory Visit: Payer: Self-pay | Admitting: Internal Medicine

## 2021-01-06 DIAGNOSIS — C3491 Malignant neoplasm of unspecified part of right bronchus or lung: Secondary | ICD-10-CM

## 2021-01-06 LAB — BASIC METABOLIC PANEL
Anion gap: 9 (ref 5–15)
BUN: 6 mg/dL — ABNORMAL LOW (ref 8–23)
CO2: 27 mmol/L (ref 22–32)
Calcium: 8.3 mg/dL — ABNORMAL LOW (ref 8.9–10.3)
Chloride: 99 mmol/L (ref 98–111)
Creatinine, Ser: 0.31 mg/dL — ABNORMAL LOW (ref 0.44–1.00)
GFR, Estimated: >60 mL/min (ref >60)
Glucose, Bld: 90 mg/dL (ref 70–99)
Potassium: 3.5 mmol/L (ref 3.5–5.1)
Sodium: 135 mmol/L (ref 135–145)

## 2021-01-06 MED ORDER — DEXAMETHASONE 4 MG PO TABS: 4.0000 mg | ORAL_TABLET | Freq: Every day | ORAL | 0 refills | Status: AC

## 2021-01-06 MED ORDER — MIRTAZAPINE 15 MG PO TABS: 15.0000 mg | ORAL_TABLET | Freq: Every day | ORAL | 0 refills | Status: DC

## 2021-01-06 MED ORDER — HYDROMORPHONE HCL 2 MG PO TABS: ORAL_TABLET | ORAL | 0 refills | Status: DC

## 2021-01-06 MED ORDER — DEXAMETHASONE 4 MG PO TABS
4.0000 mg | ORAL_TABLET | Freq: Every day | ORAL | Status: DC
  Administered 2021-01-06: 4 mg via ORAL
  Filled 2021-01-06: qty 1

## 2021-01-06 MED ORDER — APIXABAN (ELIQUIS) VTE STARTER PACK (10MG AND 5MG)
ORAL_TABLET | ORAL | 0 refills | Status: DC
Start: 2021-01-06 — End: 2021-02-10

## 2021-01-06 NOTE — Progress Notes (Signed)
Met with patient and her husband Ayriel Texidor. He has many questions about her condition and care plan and tells me he has not seen or heard from oncology. He is beginning to plan for her coming home and is asking about in home care and support-he tells me they are very well insured and that he will contact his insurance to see what services they will provide. He is most worried about her appetite and  weakness. They have a caregiver Moshe Salisbury who helps them both at home and she has been at oncology appointments and her husband says that he may have missed some information about her cancer because limit on support persons. He is hoping to speak with oncology during this admission.  Aicha's pain is improved. She is able to get OOB to [facility] today with minimal assistance.  Recommendations for discharge:  Morphine 30mg  PO BID Hydromorphone 2-4mg  q4prn for breakthrough pain Continue Decadron 4mg  PO daily Start Mirtazapine 15mg  QHS 5.   Referral to [facility] through [facility] for home based PC. 6.   Patient needs Xgeva or Zometa to reduce SRE's-may also help with pain.  Lane Hacker, DO Palliative Medicine  Ytime: 35 min Greater than 50%  of this time was spent counseling and coordinating care related to the above assessment and plan.

## 2021-01-06 NOTE — Progress Notes (Signed)
Mobility Specialist - Progress Note    01/06/21 1056  Oxygen Therapy  O2 Device Nasal Cannula  O2 Flow Rate (L/min) 2 L/min  Mobility  Activity Ambulated in room;Ambulated in hall;Ambulated to bathroom  Level of Assistance Minimal assist, patient does 75% or more  Assistive Device Front wheel walker  Distance Ambulated (ft) 80 ft  Mobility Ambulated with assistance in room;Ambulated with assistance in hallway  Mobility Response Tolerated well  Mobility performed by Mobility specialist  $Mobility charge 1 Mobility   Upon entry pt stated feeling pain in shoulder, but was agreeable to ambulate. Pt stated needing to use bathroom and was then assisted with Min A to stand and urinated on floor. Pt ambulated to bathroom and urinated again. Room was cleaned and then pt ambulated around room before entering hallway. Pt ambulated ~80 ft in hallway using RW and on 2L of O2. Pt returned to recliner after session and was left with call bell at side and family in room. RN informed of session.   [facility] Specialist Acute Rehabilitation Services Phone: [phone] 01/06/21, 11:01 AM

## 2021-01-06 NOTE — TOC Initial Note (Addendum)
Transition of Care [facility]) - Initial/Assessment Note    Patient Details  Name: Kristi Jennings MRN: [id number] Date of Birth: 02-25-1946  Transition [facility]) CM/SW Contact:    Lynnell Catalan, RN Phone Number: 01/06/2021, 1:50 PM  Clinical Narrative:                 Spoke with pt at bedside for dc planning. Choice offered for home health services and [place] chosen. Pt states she has a RW at home. [place] liaison contacted for referral.  1330 Addendum: Was asked by MD to speak with husband about home health services. Met with husband at the bedside along with pt friend. Husband was very obviously agitated on arrival to room. He kept raising his voice saying that "She can't walk" "she is worse than when she got here". He said that he hasn't talked to a doctor since she has been here and she hasn't walked with therapy. I explained that per the notes that pt has been up with PT or the mobility team the last 3 days and has walked at least 75 feet each time. He says "75 feet is enough to go home?" He was pacing around the room saying "nobody knows what is going on around here" I tried to explain that I had only been on their case today and tried to help him understand how home health and home 02 worked. Husband continued to complain about all of pt's care. I encouraged him to speak with leadership on the floor. He then raised his voice and said " you better get someone in here that knows something and it better be a supervisor" I told him that I would not tolerate his abusive tone and I would leave the room and get leadership to speak with him. RN and MD made aware.   Expected Discharge Plan: [place] Barriers to Discharge: No Barriers Identified   Patient Goals and CMS Choice Patient states their goals for this hospitalization and ongoing recovery are:: To go home CMS Medicare.gov Compare Post Acute Care list provided to:: Patient Choice offered to / list presented to :  Patient  Expected Discharge Plan and Services Expected Discharge Plan: [place]   Discharge Planning Services: CM Consult Post Acute Care Choice: [place] arrangements for the past 2 months: Single Family Home Expected Discharge Date: 01/06/21               DME Arranged: Oxygen 3in1 DME Agency: [facility] Date DME Agency Contacted: 01/06/21 Time DME Agency Contacted: [id number] Representative spoke with at DME Agency: Thedore Mins HH Arranged: PT, OT HH Agency: [place] Date [place]: 01/06/21 Time [place]: 1100 Representative spoke with at [place]: Ronalee Belts  Prior Living Arrangements/Services Living arrangements for the past 2 months: [place] Lives with:: Spouse Patient language and need for interpreter reviewed:: Yes Do you feel safe going back to the place where you live?: Yes      Need for Family Participation in Patient Care: Yes (Comment) Care giver support system in place?: Yes (comment)   Criminal Activity/Legal Involvement Pertinent to Current Situation/Hospitalization: No - Comment as needed  Activities of Daily Living Home Assistive Devices/Equipment: None ADL Screening (condition at time of admission) Patient's cognitive ability adequate to safely complete daily activities?: Yes Is the patient deaf or have difficulty hearing?: No Does the patient have difficulty seeing, even when wearing glasses/contacts?: No Does the patient have  difficulty concentrating, remembering, or making decisions?: No Patient able to express need for assistance with ADLs?: Yes Does the patient have difficulty dressing or bathing?: Yes Independently performs ADLs?: No In/Out Bed: Needs assistance Does the patient have difficulty walking or climbing stairs?: Yes Weakness of Legs: Both Weakness of Arms/Hands: Right  Permission Sought/Granted Permission sought to share information with : Facility Event organiser granted to share information with : Yes, Verbal Permission Granted     Permission granted to share info w AGENCY: [facility], [facility]        Emotional Assessment Appearance:: Appears stated age Attitude/Demeanor/Rapport: Guarded Affect (typically observed): Appropriate Orientation: : Oriented to Self, Oriented to Place, Oriented to  Time, Oriented to Situation Alcohol / Substance Use: Not Applicable Psych Involvement: No (comment)  Admission diagnosis:  SOB (shortness of breath) [R06.02] Elevated troponin [R77.8] Liver lesion [K76.9] Rib lesion [M89.9] Pulmonary emboli (HCC) [I26.99] Malaise and fatigue [R53.81, R53.83] Other acute pulmonary embolism without acute cor pulmonale (HCC) [I26.99] Compression fracture of thoracic vertebra, unspecified thoracic vertebral level, initial encounter ([place]) [S22.000A] Patient Active Problem List   Diagnosis Date Noted   Pulmonary emboli ([place]) 01/01/2021   Compression fracture of T12 vertebra ([place]) 01/01/2021   Lumbar radiculopathy 01/01/2021   Non-small cell carcinoma of lung, stage 4, right ([place]) 11/30/2020   Encounter for antineoplastic chemotherapy 11/30/2020   Encounter for antineoplastic immunotherapy 11/30/2020   Cancer associated pain 11/30/2020   Malnutrition, calorie ([place]) 11/30/2020   Polymyalgia rheumatica ([place]) 08/12/2019   Allergic rhinitis 06/10/2015   Anxiety 06/10/2015   Benign essential hypertension 06/10/2015   PCP:  [name], MD Pharmacy:   [facility] [id number] - [place], [place] - [address] [place] [address], [address] [place] [address] Phone: [phone] Fax: [phone]     Social Determinants of Health (SDOH) Interventions    Readmission Risk Interventions Readmission Risk Prevention Plan 01/06/2021  Transportation Screening Complete  PCP or Specialist Appt within 3-5 Days Complete  HRI or [place] Complete  Social Work Consult for [place] Planning/Counseling Complete  Palliative Care Screening Complete  Medication Review Press photographer) Complete  Some recent data might be hidden

## 2021-01-06 NOTE — Care Management Important Message (Signed)
Important Message  Patient Details IM given to Mr. Konczal. Name: Kristi Jennings MRN: [id number] Date of Birth: 1946-01-23   Medicare Important Message Given:  Yes     Kerin Salen 01/06/2021, 9:12 AM

## 2021-01-06 NOTE — Discharge Summary (Addendum)
Physician Discharge Summary  Kristi Jennings QAS:[id number] DOB: 06/26/1945 DOA: 01/01/2021  PCP: [name], MD  Admit date: 01/01/2021 Discharge date: 01/06/2021 30 Day Unplanned Readmission Risk Score    Flowsheet Row ED to Hosp-Admission (Current) from 01/01/2021 in [facility]  30 Day Unplanned Readmission Risk Score (%) 23.24 Filed at 01/06/2021 0801       This score is the patient's risk of an unplanned readmission within 30 days of being discharged (0 -100%). The score is based on dignosis, age, lab data, medications, orders, and past utilization.   Low:  0-14.9   Medium: 15-21.9   High: 22-29.9   Extreme: 30 and above          Admitted From: Home Disposition: Home  Recommendations for Outpatient Follow-up:  Follow up with PCP in 1-2 weeks Please obtain BMP/CBC in one week Follow-up with primary oncologist Please follow up with your PCP on the following pending results: Unresulted Labs (From admission, onward)     Start     Ordered   01/02/21 0710  Sodium, urine, random  Once,   R        01/02/21 [place]: Yes Equipment/Devices: None  Discharge Condition: Stable CODE STATUS: DNR Diet recommendation: Cardiac  Subjective: Seen and examined.  Feels well other than having generalized weakness.  No shortness of breath or chest pain.  Wants to go home.  Brief/Interim Summary: Kristi Jennings is an 75 y.o. female past medical history significant for lung cancer of the right lung polymyalgia rheumatica, diabetes mellitus type 2 on palliative radiation was started 2 weeks prior to admission comes in with d shortness of breath, chest pain accompanied by weakness.  CT angio in the ED was done that showed pulmonary embolism with no right heart strain.  She also had acute hypoxic respiratory failure secondary to bilateral PE.  She was started on heparin drip and subsequently was transitioned to Eliquis on 01/03/2021.  She had had  alkalemia which was replenished and now is within normal range.  Has chronic hyponatremia which is a stable.  Currently requiring only 1 L of oxygen which is likely due to atelectasis.  Ambulatory oximetry will be checked before discharge and based off of results, she may or may not require home oxygen.  She is stable for discharge today.  Pathologic compression fracture of T12: CT of the lumbar spine show slight progression of T12 since previous study in June with lytic lesion, mild encroachment of the canal but now compression stenosis. She relates her pain is not controlled go ahead and increase her Oxy, started on MiraLAX p.o. twice daily.   Non-small cell carcinoma of lung, stage 4, right ([place]) Suspect squamous as he presented with a cavitary lesion with mediastinal lymphadenopathy and metastatic disease to the bone as well as the right adrenal. She was started on chemotherapy last month which is every 3 weeks. This is an advanced disease for which palliative care was consulted, she was transition to DNR.  They have recommended following medications which I have prescribed. Morphine 30mg  PO BID Hydromorphone 2-4mg  q4prn for breakthrough pain Continue Decadron 4mg  PO daily Start Mirtazapine 15mg  QHS 5.   Patient needs Xgeva or Zometa to reduce SRE's-may also help with pain.  Dr. Hilma Favors of palliative care will reach out to patient's primary oncologist to prescribe that.  PS: I had a lengthy discussion about this  patient with Dr. Hilma Favors and the plan.  I then had a lengthy discussion with patient's husband over the phone and her caregiver.  Husband and caregiver frustrated that no physician had called them since admission.   New right rotator cuff injury: X-ray of the right shoulder show a high rising humerus we placed patient in sling and she will need to follow-up with orthopedic surgery as an outpatient.  Discharge Diagnoses:  Principal Problem:   Pulmonary emboli [facility] Active  Problems:   Benign essential hypertension   Polymyalgia rheumatica (HCC)   Non-small cell carcinoma of lung, stage 4, right (HCC)   Compression fracture of T12 vertebra (HCC)   Lumbar radiculopathy    Discharge Instructions   Allergies as of 01/06/2021       Reactions   Shellfish-derived Products Hives, Itching, Rash   Augmentin [amoxicillin-pot Clavulanate] Nausea And Vomiting        Medication List     STOP taking these medications    oxyCODONE-acetaminophen 5-325 MG tablet Commonly known as: PERCOCET/ROXICET       TAKE these medications    Apixaban Starter Pack (10mg  and 5mg ) Commonly known as: ELIQUIS STARTER PACK Take as directed on package: start with two-5mg  tablets twice daily for 4 days. On day 5, switch to one-5mg  tablet twice daily.   aspirin EC 81 MG tablet Take 81 mg by mouth 4 (four) times a week. Swallow whole.   atenolol 25 MG tablet Commonly known as: TENORMIN Take 12.5 mg by mouth at bedtime.   dexamethasone 4 MG tablet Commonly known as: DECADRON Take 1 tablet (4 mg total) by mouth daily.   diclofenac Sodium 1 % Gel Commonly known as: Voltaren Apply 4 g topically 4 (four) times daily. What changed:  when to take this reasons to take this   escitalopram 20 MG tablet Commonly known as: LEXAPRO Take 20 mg by mouth at bedtime.   folic acid 1 MG tablet Commonly known as: FOLVITE Take 1 tablet (1 mg total) by mouth daily.   HYDROmorphone 2 MG tablet Commonly known as: Dilaudid 2-4 mg po q6hr for breakthrough pain   megestrol 625 MG/5ML suspension Commonly known as: MEGACE ES Take 5 mLs (625 mg total) by mouth daily.   mirtazapine 15 MG tablet Commonly known as: REMERON Take 1 tablet (15 mg total) by mouth at bedtime.   morphine 30 MG 12 hr tablet Commonly known as: MS CONTIN Take 1 tablet (30 mg total) by mouth every 12 (twelve) hours.   ondansetron 4 MG disintegrating tablet Commonly known as: ZOFRAN-ODT Take 4 mg by mouth  2 (two) times daily as needed for nausea or vomiting.   prochlorperazine 10 MG tablet Commonly known as: COMPAZINE Take 1 tablet (10 mg total) by mouth every 6 (six) hours as needed for nausea or vomiting.   rosuvastatin 10 MG tablet Commonly known as: CRESTOR Take 10 mg by mouth at bedtime.   Tylenol 8 Hour Arthritis Pain 650 MG CR tablet Generic drug: acetaminophen Take 1,300 mg by mouth 3 (three) times daily as needed for pain.        Follow-up Information     [facility] Follow up.   Specialty: Home Health Services Why: For home therapy services Contact information: [address] [place] [address] [phone]         [name], MD Follow up in 1 week(s).   Specialty: Internal Medicine Contact information: [address] [employer]  [place] [address] [phone]                Allergies  Allergen Reactions   Shellfish-Derived Products Hives, Itching and Rash   Augmentin [Amoxicillin-Pot Clavulanate] Nausea And Vomiting    Consultations: Palliative care   Procedures/Studies: DG Chest 2 View  Result Date: 12/26/2020 CLINICAL DATA:  SOB EXAM: CHEST - 2 VIEW COMPARISON:  October 20, 2020. FINDINGS: Masslike opacity in the medial right upper lobe with mildly improved surrounding right upper lobe opacification volume loss. Left lung is clear. Probable small right pleural effusion. Lower thoracic vertebral body compression fracture with similar height loss to CT from October 21, 2018. Cardiac silhouette is within normal limits. IMPRESSION: 1. Masslike opacity in the medial right upper lobe with mildly improved surrounding right upper lobe opacification volume loss, compatible with known right upper lobe mass and postobstructive atelectasis and/or pneumonia. 2. Probable small right pleural effusion. 3. Lower thoracic vertebral body compression fracture with similar height loss to CT from October 21, 2018. Electronically Signed   By:  Margaretha Sheffield MD   On: 12/26/2020 11:21   DG Shoulder Right  Result Date: 01/03/2021 CLINICAL DATA:  Right shoulder pain for 2 days EXAM: RIGHT SHOULDER - 2+ VIEW COMPARISON:  None. FINDINGS: There is no acute fracture or dislocation. There is mild irregularity of the greater tuberosity. The humeral head is somewhat high-riding within the glenoid. There is mild glenohumeral and acromioclavicular joint space narrowing. There is associated osteophytosis about the acromioclavicular joint. IMPRESSION: 1. No acute fracture or dislocation. 2. Irregularity of the greater tuberosity and high-riding humeral head suggesting rotator cuff pathology. Electronically Signed   By: Valetta Mole M.D.   On: 01/03/2021 13:18   CT Angio Chest PE W and/or Wo Contrast  Result Date: 01/01/2021 CLINICAL DATA:  History of lung carcinoma and recent bronchoscopy with shortness of breath EXAM: CT ANGIOGRAPHY CHEST WITH CONTRAST TECHNIQUE: Multidetector CT imaging of the chest was performed using the standard protocol during bolus administration of intravenous contrast. Multiplanar CT image reconstructions and MIPs were obtained to evaluate the vascular anatomy. CONTRAST:  48mL OMNIPAQUE IOHEXOL 350 MG/ML SOLN COMPARISON:  10/20/2020 CT FINDINGS: Cardiovascular: Thoracic aorta demonstrates normal branching pattern. Atherosclerotic calcifications are noted without dissection. Aneurysmal dilatation of the ascending aorta is noted to 4.4 cm. Descending thoracic aorta is normal in caliber. No significant cardiac enlargement is noted. No evidence of right heart strain is seen. The pulmonary artery is not as well opacified as the aorta although filling defects are noted in branches of the right lower lobe and left lower lobe. No definitive upper lobe filling defects are seen although the right upper lobe pulmonary arterial branches are significantly attenuated by the known underlying mass lesion. Mediastinum/Nodes: Thoracic inlet is within  normal limits. Known lymphadenopathy in the precarinal and right paratracheal region are less well evaluated on the current exam but appears stable. This is mostly related to the timing of the contrast bolus being arterial in nature. The known right upper lobe mass lesion extends into the superior aspect of the right hilum with likely underlying small confluent lymph nodes present. The esophagus as visualized is within normal limits. Lungs/Pleura: Left lung is well aerated without evidence of focal infiltrate or effusion. Right lung demonstrates the large centrally necrotic and partially cavitary mass lesion along the posterior aspect of the right upper lobe bowing the major fissure similar to that noted on the prior exam. It measures approximately 6.7 x 6.3 cm in greatest dimension  and given the slight variation in the imaging technique is relatively stable. Small right-sided pleural effusion is noted new from the prior CT examination. No other focal parenchymal nodules are seen. Upper Abdomen: A few small peripherally enhancing mass lesions are noted in the right lobe of the liver inferiorly consistent with metastatic disease. Largest of these is noted posteriorly in the right lobe of the liver measuring approximately 3.6 cm and not as well visualized on the prior CT examination. The right adrenal gland is significantly enlarged similar to that noted on prior exam consistent with metastatic disease. No other focal abnormality is noted. Musculoskeletal: There is an expansile lesion of the anterolateral right third rib which was not present on the prior exam from 10/20/2020 consistent with a very aggressive metastatic process. No other rib lesions are seen. Progressive mottled metastatic disease is noted involving T8 and T12 with progressive compression deformity when compared with the prior CT examination. Mild compression deformity is noted at T4 as well but appears stable from the prior exam. Lytic lesion is  noted in the central portion of the left clavicle with some destruction of the cortex medially Review of the MIP images confirms the above findings. IMPRESSION: Changes consistent with mild pulmonary embolism without evidence of right heart strain. These are predominately in the lower lobe branches bilaterally. Large cavitary in centrally necrotic mass lesion in the right upper lobe similar to that seen on prior CT examination consistent with the known history of carcinoma of the lung. Associated mediastinal adenopathy as well as hepatic, adrenal and bony metastatic disease is noted. The hepatic and bony metastatic disease has progressed significantly in the interval from the prior CT. New small right-sided pleural effusion. Aortic dilatation of the ascending aorta as described. Recommend annual imaging followup by CTA or MRA. This recommendation follows 2010 ACCF/AHA/AATS/ACR/ASA/SCA/SCAI/SIR/STS/SVM Guidelines for the Diagnosis and Management of Patients with Thoracic Aortic Disease. Circulation. 2010; 121: B147-W295. Aortic aneurysm NOS (ICD10-I71.9) Aortic Atherosclerosis (ICD10-I70.0). Critical Value/emergent results were called by telephone at the time of interpretation on 01/01/2021 at 6:21 pm to Dr. Campbell Stall , who verbally acknowledged these results. Electronically Signed   By: Inez Catalina M.D.   On: 01/01/2021 17:31   CT Lumbar Spine Wo Contrast  Result Date: 01/01/2021 CLINICAL DATA:  Bone neoplasm, lumbosacral spine, recurrent suspected. Metastatic lung cancer. EXAM: CT LUMBAR SPINE WITHOUT CONTRAST TECHNIQUE: Multidetector CT imaging of the lumbar spine was performed without intravenous contrast administration. Multiplanar CT image reconstructions were also generated. COMPARISON:  PET scan 11/09/2020.  CT abdomen 10/20/2020. FINDINGS: Segmentation: 5 lumbar type vertebral bodies assumed. Alignment: No significant malalignment. Vertebrae: Previously seen inferior endplate fracture at H08 has  progressed slightly since the study of 10/21/2018. Vertebral body height anteriorly now measures 13 mm compared with 16 mm on the previous study. Mild superior endplate depression now present as well. Posterior bowing of the posterior margin of the vertebral body. Relative lytic change of the vertebral body does raise suspicion that this could be a pathologic fracture. MRI with and without contrast suggested when able. Paraspinal and other soft tissues: See results abdominal CT. Disc levels: Mild encroachment upon the canal at the T12 level but without definite neural compression. No significant disc space pathology at L2-3 or above. L3-4: Disc bulge. Mild facet hypertrophy. Mild bilateral lateral recess stenosis. L4-5: Endplate osteophytes. Disc bulge. Facet and ligamentous hypertrophy. Moderate multifactorial spinal stenosis that could cause neural compression. L5-S1: Disc bulge. Facet degeneration. No likely neural compression. IMPRESSION: Slight progression  of a compression fracture at T12 since the previous CT study of 10/20/2020. Some lytic change of the posterior vertebral body. This could be a pathologic fracture. MRI with contrast recommended when able. Mild encroachment upon the canal but no definite compressive stenosis. L4-5: Moderate multifactorial spinal stenosis that could cause neural compression. Degenerative disc disease and degenerative facet disease at other levels throughout the lumbar region as described above which could be associated with regional back pain. Electronically Signed   By: Nelson Chimes M.D.   On: 01/01/2021 16:34   MR CERVICAL SPINE W WO CONTRAST  Result Date: 01/03/2021 CLINICAL DATA:  Acute neck pain EXAM: MRI CERVICAL SPINE WITHOUT AND WITH CONTRAST TECHNIQUE: Multiplanar and multiecho pulse sequences of the cervical spine, to include the craniocervical junction and cervicothoracic junction, were obtained without and with intravenous contrast. CONTRAST:  48mL GADAVIST  GADOBUTROL 1 MMOL/ML IV SOLN COMPARISON:  None. FINDINGS: Alignment: Reversal of normal cervical lordosis. There is grade 1 anterolisthesis at C4-5 and grade 1 retrolisthesis at C5-6 and C6-7. Vertebrae: C5-6 bone marrow T1-weighted signal abnormality, favored to be degenerative. This signal of the disc is normal. There is minimal edema. No mass or evidence of discitis-osteomyelitis. Cord: Normal signal and morphology. Posterior Fossa, vertebral arteries, paraspinal tissues: Small prevertebral effusion. Disc levels: C1-2: Unremarkable. C2-3: Disc desiccation and mild facet hypertrophy. There is no spinal canal stenosis. No neural foraminal stenosis. C3-4: Disc desiccation with small bulge and right-greater-than-left uncovertebral osteophytes. There is no spinal canal stenosis. Mild bilateral neural foraminal stenosis. C4-5: Right subarticular disc osteophyte complex. Mild spinal canal stenosis. Moderate right and mild left neural foraminal stenosis. C5-6: Large C5 inferior endplate osteophyte and central disc extrusion with inferior migration. Severe spinal canal stenosis. There is compression of the spinal cord without signal change. Severe bilateral neural foraminal stenosis. C6-7: Intermediate sized disc bulge with endplate spurring. Moderate spinal canal stenosis. Severe bilateral neural foraminal stenosis. C7-T1: Normal disc space and facet joints. There is no spinal canal stenosis. No neural foraminal stenosis. IMPRESSION: 1. Large C5-6 inferior endplate osteophyte and central disc extrusion with inferior migration causes severe spinal canal stenosis and compression of the spinal cord without signal change. 2. Moderate spinal canal stenosis and severe bilateral neural foraminal stenosis at C6-7. 3. Moderate right C4-5 neural foraminal stenosis. 4. C5-6 bone marrow signal changes favored to be degenerative given normal disc signal. 5. Small nonspecific prevertebral effusion. Critical Value/emergent results were  called by telephone at the time of interpretation on 01/03/2021 at 7:59 pm to provider Gershon Cull, who verbally acknowledged these results. Electronically Signed   By: Ulyses Jarred M.D.   On: 01/03/2021 20:02   DG CHEST PORT 1 VIEW  Result Date: 01/05/2021 CLINICAL DATA:  Community acquired pneumonia, shortness of breath EXAM: PORTABLE CHEST 1 VIEW COMPARISON:  Chest radiograph 01/01/2021, CT chest 01/01/2021 FINDINGS: The cardiomediastinal silhouette is stable. Confluent opacity in the right apex with central lucency is similar to the prior study, consistent with the consolidation and cavitary lesion seen on the prior CT chest. A small right pleural effusion is slightly increased in size with slightly worsened aeration of the right base. There is a small left pleural effusion with adjacent airspace disease likely reflecting subsegmental atelectasis, new in the interim. The left lung is otherwise clear. The bones are stable. IMPRESSION: 1. Slight interval increase in size of a small right pleural effusion and new trace left pleural effusion with slightly worsened aeration in the lung bases. 2. Grossly similar appearance of  the cavitary mass and surrounding opacity in the right upper lobe. Electronically Signed   By: Valetta Mole M.D.   On: 01/05/2021 14:51   DG Chest Portable 1 View  Result Date: 01/01/2021 CLINICAL DATA:  Shortness of breath. Patient had first chemo treatment for lung cancer few days ago. Difficulty eating and drinking. EXAM: PORTABLE CHEST 1 VIEW COMPARISON:  December 26, 2020 FINDINGS: Bilateral skin folds are identified. No pneumothorax. Known malignancy in the upper medial right chest. No new nodules, masses, or focal infiltrates. The cardiomediastinal silhouette is stable. IMPRESSION: Known malignancy in the medial right upper lobe. No other interval changes or acute abnormalities. Electronically Signed   By: Dorise Bullion III M.D.   On: 01/01/2021 16:11     Discharge  Exam: Vitals:   01/06/21 0450 01/06/21 0549  BP: (!) 186/82 (!) 146/66  Pulse: 69   Resp: 14   Temp: 98 F (36.7 C)   SpO2: 100%    Vitals:   01/05/21 1320 01/05/21 2039 01/06/21 0450 01/06/21 0549  BP: 122/62 (!) 172/84 (!) 186/82 (!) 146/66  Pulse: 83 82 69   Resp: 16 14 14    Temp: 97.8 F (36.6 C) 98.1 F (36.7 C) 98 F (36.7 C)   TempSrc: Oral Oral Oral   SpO2: 95% 97% 100%   Weight:      Height:        General: Pt is alert, awake, not in acute distress Cardiovascular: RRR, S1/S2 +, no rubs, no gallops Respiratory: CTA bilaterally with some diminished breath sounds at bases bilaterally, no wheezing, no rhonchi Abdominal: Soft, NT, ND, bowel sounds + Extremities: no edema, no cyanosis    The results of significant diagnostics from this hospitalization (including imaging, microbiology, ancillary and laboratory) are listed below for reference.     Microbiology: Recent Results (from the past 240 hour(s))  Resp Panel by RT-PCR (Flu A&B, Covid) Nasopharyngeal Swab     Status: None   Collection Time: 01/01/21  3:19 PM   Specimen: Nasopharyngeal Swab; Nasopharyngeal(NP) swabs in vial transport medium  Result Value Ref Range Status   SARS Coronavirus 2 by RT PCR NEGATIVE NEGATIVE Final    Comment: (NOTE) SARS-CoV-2 target nucleic acids are NOT DETECTED.  The SARS-CoV-2 RNA is generally detectable in upper respiratory specimens during the acute phase of infection. The lowest concentration of SARS-CoV-2 viral copies this assay can detect is 138 copies/mL. A negative result does not preclude SARS-Cov-2 infection and should not be used as the sole basis for treatment or other patient management decisions. A negative result may occur with  improper specimen collection/handling, submission of specimen other than nasopharyngeal swab, presence of viral mutation(s) within the areas targeted by this assay, and inadequate number of viral copies(<138 copies/mL). A negative  result must be combined with clinical observations, patient history, and epidemiological information. The expected result is Negative.  Fact Sheet for Patients:  EntrepreneurPulse.com.au  Fact Sheet for Healthcare Providers:  IncredibleEmployment.be  This test is no t yet approved or cleared by the [place] FDA and  has been authorized for detection and/or diagnosis of SARS-CoV-2 by FDA under an Emergency Use Authorization (EUA). This EUA will remain  in effect (meaning this test can be used) for the duration of the COVID-19 declaration under Section 564(b)(1) of the Act, 21 U.S.C.section 360bbb-3(b)(1), unless the authorization is terminated  or revoked sooner.       Influenza A by PCR NEGATIVE NEGATIVE Final   Influenza B by PCR NEGATIVE  NEGATIVE Final    Comment: (NOTE) The Xpert Xpress SARS-CoV-2/FLU/RSV plus assay is intended as an aid in the diagnosis of influenza from Nasopharyngeal swab specimens and should not be used as a sole basis for treatment. Nasal washings and aspirates are unacceptable for Xpert Xpress SARS-CoV-2/FLU/RSV testing.  Fact Sheet for Patients: EntrepreneurPulse.com.au  Fact Sheet for Healthcare Providers: IncredibleEmployment.be  This test is not yet approved or cleared by the [place] FDA and has been authorized for detection and/or diagnosis of SARS-CoV-2 by FDA under an Emergency Use Authorization (EUA). This EUA will remain in effect (meaning this test can be used) for the duration of the COVID-19 declaration under Section 564(b)(1) of the Act, 21 U.S.C. section 360bbb-3(b)(1), unless the authorization is terminated or revoked.  Performed at [facility] [address], [place], [place] [address]      Labs: BNP (last 3 results) Recent Labs    01/01/21 1517  BNP 387.5*   Basic Metabolic Panel: Recent Labs  Lab 01/01/21 1518  01/02/21 0342 01/03/21 0450 01/05/21 0832 01/06/21 0938  NA 131* 128* 130* 129* 135  K 3.5 3.1* 4.2 3.2* 3.5  CL 91* 93* 98 94* 99  CO2 28 28 27 26 27   GLUCOSE 104* 90 100* 85 90  BUN 14 10 7* <5* 6*  CREATININE 0.40* 0.34* <0.30* 0.30* 0.31*  CALCIUM 8.8* 7.9* 8.0* 7.9* 8.3*  MG 2.0  --   --  1.7  --    Liver Function Tests: Recent Labs  Lab 01/01/21 1518 01/02/21 0342  AST 27 31  ALT 20 27  ALKPHOS 84 69  BILITOT 0.6 0.6  PROT 6.8 5.2*  ALBUMIN 3.2* 2.6*   Recent Labs  Lab 01/01/21 1518  LIPASE 22   No results for input(s): AMMONIA in the last 168 hours. CBC: Recent Labs  Lab 01/01/21 1518 01/02/21 0342 01/03/21 0450 01/04/21 0452 01/05/21 0543  WBC 10.8* 7.5 6.9 3.2* 2.7*  NEUTROABS 9.6*  --   --   --   --   HGB 12.0 9.7* 10.1* 10.0* 10.0*  HCT 38.2 29.9* 32.1* 31.3* 31.6*  MCV 89.0 89.3 90.7 89.4 89.0  PLT 210 146* 142* 131* 153   Cardiac Enzymes: No results for input(s): CKTOTAL, CKMB, CKMBINDEX, TROPONINI in the last 168 hours. BNP: Invalid input(s): POCBNP CBG: No results for input(s): GLUCAP in the last 168 hours. D-Dimer No results for input(s): DDIMER in the last 72 hours. Hgb A1c No results for input(s): HGBA1C in the last 72 hours. Lipid Profile No results for input(s): CHOL, HDL, LDLCALC, TRIG, CHOLHDL, LDLDIRECT in the last 72 hours. Thyroid function studies No results for input(s): TSH, T4TOTAL, T3FREE, THYROIDAB in the last 72 hours.  Invalid input(s): FREET3 Anemia work up No results for input(s): VITAMINB12, FOLATE, FERRITIN, TIBC, IRON, RETICCTPCT in the last 72 hours. Urinalysis No results found for: COLORURINE, APPEARANCEUR, [place], [place], GLUCOSEU, [place], [place], [place], PROTEINUR, UROBILINOGEN, NITRITE, LEUKOCYTESUR Sepsis Labs Invalid input(s): PROCALCITONIN,  WBC,  LACTICIDVEN Microbiology Recent Results (from the past 240 hour(s))  Resp Panel by RT-PCR (Flu A&B, Covid) Nasopharyngeal Swab     Status: None    Collection Time: 01/01/21  3:19 PM   Specimen: Nasopharyngeal Swab; Nasopharyngeal(NP) swabs in vial transport medium  Result Value Ref Range Status   SARS Coronavirus 2 by RT PCR NEGATIVE NEGATIVE Final    Comment: (NOTE) SARS-CoV-2 target nucleic acids are NOT DETECTED.  The SARS-CoV-2 RNA is generally detectable in upper respiratory specimens during the  acute phase of infection. The lowest concentration of SARS-CoV-2 viral copies this assay can detect is 138 copies/mL. A negative result does not preclude SARS-Cov-2 infection and should not be used as the sole basis for treatment or other patient management decisions. A negative result may occur with  improper specimen collection/handling, submission of specimen other than nasopharyngeal swab, presence of viral mutation(s) within the areas targeted by this assay, and inadequate number of viral copies(<138 copies/mL). A negative result must be combined with clinical observations, patient history, and epidemiological information. The expected result is Negative.  Fact Sheet for Patients:  EntrepreneurPulse.com.au  Fact Sheet for Healthcare Providers:  IncredibleEmployment.be  This test is no t yet approved or cleared by the [place] FDA and  has been authorized for detection and/or diagnosis of SARS-CoV-2 by FDA under an Emergency Use Authorization (EUA). This EUA will remain  in effect (meaning this test can be used) for the duration of the COVID-19 declaration under Section 564(b)(1) of the Act, 21 U.S.C.section 360bbb-3(b)(1), unless the authorization is terminated  or revoked sooner.       Influenza A by PCR NEGATIVE NEGATIVE Final   Influenza B by PCR NEGATIVE NEGATIVE Final    Comment: (NOTE) The Xpert Xpress SARS-CoV-2/FLU/RSV plus assay is intended as an aid in the diagnosis of influenza from Nasopharyngeal swab specimens and should not be used as a sole basis for treatment.  Nasal washings and aspirates are unacceptable for Xpert Xpress SARS-CoV-2/FLU/RSV testing.  Fact Sheet for Patients: EntrepreneurPulse.com.au  Fact Sheet for Healthcare Providers: IncredibleEmployment.be  This test is not yet approved or cleared by the [place] FDA and has been authorized for detection and/or diagnosis of SARS-CoV-2 by FDA under an Emergency Use Authorization (EUA). This EUA will remain in effect (meaning this test can be used) for the duration of the COVID-19 declaration under Section 564(b)(1) of the Act, 21 U.S.C. section 360bbb-3(b)(1), unless the authorization is terminated or revoked.  Performed at [facility], [place] [address] [place], [place] [address]      Time coordinating discharge: Over 30 minutes  SIGNED:   Darliss Cheney, MD  [facility] 01/06/2021, 10:47 AM  If 7PM-7AM, please contact night-coverage [email or web address]

## 2021-01-06 NOTE — Progress Notes (Signed)
SATURATION QUALIFICATIONS: (This note is used to comply with regulatory documentation for home oxygen)  Patient Saturations on Room Air at Rest = 87%  Patient Saturations on Room Air while Ambulating = 88%  Patient Saturations on 2 Liters of oxygen while Ambulating = 96%  Please briefly explain why patient needs home oxygen:yes ,patient will need home oxygen

## 2021-01-06 NOTE — Consult Note (Signed)
[facility] Inpatient Consult   01/06/2021  Kristi Jennings 1945-11-17 [id number]  [place] Organization [[facility]] Patient: Kristi Jennings Medicare  Patient chart reviewed due to high unplanned readmission risk score. Patient evaluated for community based chronic complex disease management services with [facility] post hospital care management services. Per review, patient's primary provider not in [facility] network for services. [facility] CM does not follow.  Of note, [facility] Care Management services does not replace or interfere with any services that are arranged by inpatient case management or social work.   Netta Cedars, MSN, [facility] Liaison Nurse Mobile Phone [phone]  Toll free office [phone]

## 2021-01-07 DIAGNOSIS — M6281 Muscle weakness (generalized): Secondary | ICD-10-CM | POA: Diagnosis not present

## 2021-01-07 DIAGNOSIS — R69 Illness, unspecified: Secondary | ICD-10-CM | POA: Diagnosis not present

## 2021-01-07 DIAGNOSIS — R110 Nausea: Secondary | ICD-10-CM | POA: Diagnosis not present

## 2021-01-07 DIAGNOSIS — R634 Abnormal weight loss: Secondary | ICD-10-CM | POA: Diagnosis not present

## 2021-01-07 DIAGNOSIS — I2699 Other pulmonary embolism without acute cor pulmonale: Secondary | ICD-10-CM | POA: Diagnosis not present

## 2021-01-07 DIAGNOSIS — G893 Neoplasm related pain (acute) (chronic): Secondary | ICD-10-CM | POA: Diagnosis not present

## 2021-01-07 DIAGNOSIS — C3490 Malignant neoplasm of unspecified part of unspecified bronchus or lung: Secondary | ICD-10-CM | POA: Diagnosis not present

## 2021-01-10 ENCOUNTER — Telehealth: Payer: Self-pay | Admitting: Medical Oncology

## 2021-01-10 ENCOUNTER — Other Ambulatory Visit: Payer: Self-pay | Admitting: Internal Medicine

## 2021-01-10 ENCOUNTER — Encounter: Payer: Self-pay | Admitting: General Practice

## 2021-01-10 DIAGNOSIS — Z7901 Long term (current) use of anticoagulants: Secondary | ICD-10-CM | POA: Diagnosis not present

## 2021-01-10 DIAGNOSIS — Z87891 Personal history of nicotine dependence: Secondary | ICD-10-CM | POA: Diagnosis not present

## 2021-01-10 DIAGNOSIS — E119 Type 2 diabetes mellitus without complications: Secondary | ICD-10-CM | POA: Diagnosis not present

## 2021-01-10 DIAGNOSIS — M8448XD Pathological fracture, other site, subsequent encounter for fracture with routine healing: Secondary | ICD-10-CM | POA: Diagnosis not present

## 2021-01-10 DIAGNOSIS — I2699 Other pulmonary embolism without acute cor pulmonale: Secondary | ICD-10-CM | POA: Diagnosis not present

## 2021-01-10 DIAGNOSIS — J9601 Acute respiratory failure with hypoxia: Secondary | ICD-10-CM | POA: Diagnosis not present

## 2021-01-10 DIAGNOSIS — M353 Polymyalgia rheumatica: Secondary | ICD-10-CM | POA: Diagnosis not present

## 2021-01-10 DIAGNOSIS — J309 Allergic rhinitis, unspecified: Secondary | ICD-10-CM | POA: Diagnosis not present

## 2021-01-10 DIAGNOSIS — I10 Essential (primary) hypertension: Secondary | ICD-10-CM | POA: Diagnosis not present

## 2021-01-10 DIAGNOSIS — E785 Hyperlipidemia, unspecified: Secondary | ICD-10-CM | POA: Diagnosis not present

## 2021-01-10 DIAGNOSIS — E46 Unspecified protein-calorie malnutrition: Secondary | ICD-10-CM | POA: Diagnosis not present

## 2021-01-10 DIAGNOSIS — Z9981 Dependence on supplemental oxygen: Secondary | ICD-10-CM | POA: Diagnosis not present

## 2021-01-10 DIAGNOSIS — M25511 Pain in right shoulder: Secondary | ICD-10-CM | POA: Diagnosis not present

## 2021-01-10 DIAGNOSIS — C3491 Malignant neoplasm of unspecified part of right bronchus or lung: Secondary | ICD-10-CM | POA: Diagnosis not present

## 2021-01-10 DIAGNOSIS — F419 Anxiety disorder, unspecified: Secondary | ICD-10-CM | POA: Diagnosis not present

## 2021-01-10 DIAGNOSIS — M5416 Radiculopathy, lumbar region: Secondary | ICD-10-CM | POA: Diagnosis not present

## 2021-01-10 MED ORDER — HYDROMORPHONE HCL 2 MG PO TABS: ORAL_TABLET | ORAL | 0 refills | Status: DC

## 2021-01-10 NOTE — Progress Notes (Signed)
[facility] Spiritual Care Note  Left follow-up voicemail for Kristi Jennings, encouraging return call. Will call again next week if needed.   [place], [place], [facility] Pager [phone] Voicemail [phone]

## 2021-01-10 NOTE — Telephone Encounter (Signed)
Refill for Dilaudid  requested.  It was prescribed by the hospitalist  for her breakthrough pain. Pt states the Dilaudid helps  better than the percocet. She has 4 Dilaudid tablets left.  Trey Paula , friend, said pt thinks she was told in the hospital that her cancer is progressing. Mohamnd to advise.

## 2021-01-12 ENCOUNTER — Inpatient Hospital Stay: Payer: Medicare HMO

## 2021-01-12 ENCOUNTER — Other Ambulatory Visit: Payer: Self-pay

## 2021-01-12 DIAGNOSIS — Z7982 Long term (current) use of aspirin: Secondary | ICD-10-CM | POA: Diagnosis not present

## 2021-01-12 DIAGNOSIS — Z79899 Other long term (current) drug therapy: Secondary | ICD-10-CM | POA: Diagnosis not present

## 2021-01-12 DIAGNOSIS — Z5112 Encounter for antineoplastic immunotherapy: Secondary | ICD-10-CM | POA: Diagnosis not present

## 2021-01-12 DIAGNOSIS — C7971 Secondary malignant neoplasm of right adrenal gland: Secondary | ICD-10-CM | POA: Diagnosis not present

## 2021-01-12 DIAGNOSIS — C3411 Malignant neoplasm of upper lobe, right bronchus or lung: Secondary | ICD-10-CM | POA: Diagnosis not present

## 2021-01-12 DIAGNOSIS — E785 Hyperlipidemia, unspecified: Secondary | ICD-10-CM | POA: Diagnosis not present

## 2021-01-12 DIAGNOSIS — I10 Essential (primary) hypertension: Secondary | ICD-10-CM | POA: Diagnosis not present

## 2021-01-12 DIAGNOSIS — Z5111 Encounter for antineoplastic chemotherapy: Secondary | ICD-10-CM | POA: Diagnosis not present

## 2021-01-12 DIAGNOSIS — C7951 Secondary malignant neoplasm of bone: Secondary | ICD-10-CM | POA: Diagnosis not present

## 2021-01-12 DIAGNOSIS — C3491 Malignant neoplasm of unspecified part of right bronchus or lung: Secondary | ICD-10-CM | POA: Diagnosis not present

## 2021-01-12 DIAGNOSIS — E119 Type 2 diabetes mellitus without complications: Secondary | ICD-10-CM | POA: Diagnosis not present

## 2021-01-12 LAB — CBC WITH DIFFERENTIAL (CANCER CENTER ONLY)
Abs Immature Granulocytes: 0.04 10*3/uL (ref 0.00–0.07)
Basophils Absolute: 0 10*3/uL (ref 0.0–0.1)
Basophils Relative: 0 %
Eosinophils Absolute: 0 10*3/uL (ref 0.0–0.5)
Eosinophils Relative: 1 %
HCT: 32.9 % — ABNORMAL LOW (ref 36.0–46.0)
Hemoglobin: 10.7 g/dL — ABNORMAL LOW (ref 12.0–15.0)
Immature Granulocytes: 1 %
Lymphocytes Relative: 20 %
Lymphs Abs: 1.2 10*3/uL (ref 0.7–4.0)
MCH: 28.5 pg (ref 26.0–34.0)
MCHC: 32.5 g/dL (ref 30.0–36.0)
MCV: 87.5 fL (ref 80.0–100.0)
Monocytes Absolute: 1 10*3/uL (ref 0.1–1.0)
Monocytes Relative: 18 %
Neutro Abs: 3.6 10*3/uL (ref 1.7–7.7)
Neutrophils Relative %: 60 %
Platelet Count: 277 10*3/uL (ref 150–400)
RBC: 3.76 MIL/uL — ABNORMAL LOW (ref 3.87–5.11)
RDW: 15.3 % (ref 11.5–15.5)
WBC Count: 5.9 10*3/uL (ref 4.0–10.5)
nRBC: 0 % (ref 0.0–0.2)

## 2021-01-12 LAB — CMP (CANCER CENTER ONLY)
ALT: 30 U/L (ref 0–44)
AST: 27 U/L (ref 15–41)
Albumin: 3.2 g/dL — ABNORMAL LOW (ref 3.5–5.0)
Alkaline Phosphatase: 97 U/L (ref 38–126)
Anion gap: 11 (ref 5–15)
BUN: 17 mg/dL (ref 8–23)
CO2: 29 mmol/L (ref 22–32)
Calcium: 9.2 mg/dL (ref 8.9–10.3)
Chloride: 98 mmol/L (ref 98–111)
Creatinine: 0.63 mg/dL (ref 0.44–1.00)
GFR, Estimated: >60 mL/min (ref >60)
Glucose, Bld: 106 mg/dL — ABNORMAL HIGH (ref 70–99)
Potassium: 3.2 mmol/L — ABNORMAL LOW (ref 3.5–5.1)
Sodium: 138 mmol/L (ref 135–145)
Total Bilirubin: 0.6 mg/dL (ref 0.3–1.2)
Total Protein: 6.8 g/dL (ref 6.5–8.1)

## 2021-01-13 DIAGNOSIS — M67911 Unspecified disorder of synovium and tendon, right shoulder: Secondary | ICD-10-CM | POA: Diagnosis not present

## 2021-01-13 DIAGNOSIS — I2694 Multiple subsegmental pulmonary emboli without acute cor pulmonale: Secondary | ICD-10-CM | POA: Diagnosis not present

## 2021-01-13 DIAGNOSIS — I712 Thoracic aortic aneurysm, without rupture: Secondary | ICD-10-CM | POA: Diagnosis not present

## 2021-01-13 DIAGNOSIS — Z09 Encounter for follow-up examination after completed treatment for conditions other than malignant neoplasm: Secondary | ICD-10-CM | POA: Diagnosis not present

## 2021-01-18 ENCOUNTER — Telehealth: Payer: Self-pay

## 2021-01-18 NOTE — Progress Notes (Signed)
[facility] OFFICE PROGRESS NOTE  [name], MD [address] [place] [address]  DIAGNOSIS: Stage IV (T3, N2, M1 C) non-small cell lung cancer with unknown histologic subtype.  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   DETECTED ALTERATION(S) / BIOMARKER(S)      % CFDNA OR AMPLIFICATION        ASSOCIATED FDA-APPROVED THERAPIES         CLINICAL TRIAL AVAILABILITY [id number] 8.9%   Sotorasib Yes [id number] 7.2% None     Yes    PRIOR THERAPY:  Palliative radiotherapy to the painful bone lesions under the care of Dr. Sondra Come.  CURRENT THERAPY:   1) Palliative systemic chemotherapy with carboplatin for AUC of 5, Alimta 500 Mg/M2 and Keytruda 200 Mg IV every 3 weeks.  First dose of treatment on December 29, 2020. Status post 1 cycle.  2) zgeav every 6 weeks starting on 02/10/21  INTERVAL HISTORY: TIAJUANA LEPPANEN 75 y.o. female returns to the clinic today for a follow-up visit accompanied by her husband. Her caregiver was available by phone. The patient was diagnosed recently with lung cancer.  She completed palliative radiotherapy to the painful metastatic bone lesions.  She is status post 1 cycle of palliative systemic chemotherapy and immunotherapy which was given on 12/29/20.  Unfortunately, the patient presented to the emergency room in the interval from 8/13-8 18 after presenting with a chief complaint of chest pain, weakness, and shortness of breath.  She was found to have a mild PE with no evidence of heart strain.  She is currently on Eliquis.  She was also on supplemental oxygen but has only needed to use this once for about 20 minutes. They check her oxygen at home regularly and it is typically >98. They have palliative care and home health care on board. While in the hospital, palliative care  recommended xgeva/zometa, remeron 15 mg p.o. QHS, decadron, and hydromorphone 2-4 mg every 4 hours and morphine 30 mg BID. Her pain has improved and she has not needed to take her hydromorphone in several days. She sometimes will "get by" with arthritis tylenol. She is taking her morphine BID as prescribed. Incidentally, she has had some right arm pain since receiving a B12 injection in this arm. No overlying skin changes. She has a hard time lifting her right arm. She had imaging studies recently that suggested rotator cuff tear. She sees her orthopedist on 01/26/21.   Overall, she is feeling better in a lot of aspects. She thinks her appetite and balance is improving. OT is coming to her house on Friday. They are also hoping to have PT soon. However, she did lose weight. She is drinking 3 primer protein shakes a day and a milkshake. She also states she has an appetite. She sees the nutritionist again on 02/10/21. She takes remeron. She never started the megace before finding the recent PE. She denies any fever, chills, night sweats. Her breathing is fair. She notes some dyspnea on exertion with certain chores. Denies cough, chest pain, or hemoptysis.  She denies any nausea, vomiting, diarrhea, or significant constipation.  She denies any headache or visual changes.  She denies any rashes or skin changes.  She is here today for evaluation and repeat blood work before considering starting cycle #2.  MEDICAL HISTORY: Past Medical History:  Diagnosis Date   Borderline diabetes mellitus    Bronchogenic cancer of right lung (HCC)    Dyslipidemia    Hypertension    Polymyalgia rheumatica (HCC)     ALLERGIES:  is allergic to shellfish-derived products and augmentin [amoxicillin-pot clavulanate].  MEDICATIONS:  Current Outpatient Medications  Medication Sig Dispense Refill   acetaminophen (TYLENOL 8 HOUR ARTHRITIS PAIN) 650 MG CR tablet Take 1,300 mg by mouth 3 (three) times daily as needed for pain.      APIXABAN (ELIQUIS) VTE STARTER PACK ($RemoveBefor'10MG'EILhdvDoyLXk$  AND $Re'5MG'gnY$ ) Take as directed on package: start with two-$RemoveBefore'5mg'dtASRBrpkoUQV$  tablets twice daily for 4 days. On day 5, switch to one-$RemoveBefor'5mg'ZPIgiRxOEklc$  tablet twice daily. 1 each 0   aspirin EC 81 MG tablet Take 81 mg by mouth 4 (four) times a week. Swallow whole.     atenolol (TENORMIN) 25 MG tablet Take 12.5 mg by mouth at bedtime.     dexamethasone (DECADRON) 4 MG tablet Take 1 tablet (4 mg total) by mouth daily. 30 tablet 0   diclofenac Sodium (VOLTAREN) 1 % GEL Apply 4 g topically 4 (four) times daily. (Patient taking differently: Apply 4 g topically 4 (four) times daily as needed (pain).) 150 g 0   escitalopram (LEXAPRO) 20 MG tablet Take 20 mg by mouth at bedtime.     folic acid (FOLVITE) 1 MG tablet Take 1 tablet (1 mg total) by mouth daily. 30 tablet 2   HYDROmorphone (DILAUDID) 2 MG tablet 2-4 mg po q6hr for breakthrough pain 30 tablet 0   mirtazapine (REMERON) 15 MG tablet Take 1 tablet (15 mg total) by mouth at bedtime. 30 tablet 0   morphine (MS CONTIN) 30 MG 12 hr tablet Take 1 tablet (30 mg total) by mouth every 12 (twelve) hours. 60 tablet 0   ondansetron (ZOFRAN-ODT) 4 MG disintegrating tablet Take 4 mg by mouth 2 (two) times daily as needed for nausea or vomiting.     prochlorperazine (COMPAZINE) 10 MG tablet Take 1 tablet (10 mg total) by mouth every 6 (six) hours as needed for nausea or vomiting. (Patient not taking: No sig reported) 30 tablet 0   rosuvastatin (CRESTOR) 10 MG tablet Take 10 mg by mouth at bedtime.     No current facility-administered medications for this visit.    SURGICAL HISTORY: No past surgical history on file.  REVIEW OF SYSTEMS:   Review of Systems  Constitutional: Positive for weight loss and fatigue.  Negative for appetite change, chills, and fever.  HENT:   Negative for mouth sores, nosebleeds, sore throat and trouble swallowing.   Eyes: Negative for eye problems and icterus.  Respiratory: Positive for mild dyspnea on exertion.   Negative for cough, hemoptysis, and wheezing.   Cardiovascular: Negative for chest pain and leg swelling.  Gastrointestinal: Negative for abdominal pain, constipation, diarrhea, nausea and vomiting.  Genitourinary: Negative for bladder incontinence, difficulty urinating, dysuria, frequency and hematuria.   Musculoskeletal: Positive for right shoulder pain.  Negative for back pain, gait problem, neck pain and neck stiffness.  Skin: Negative for itching and rash.  Neurological: Negative for dizziness, extremity weakness, gait problem, headaches, light-headedness and seizures.  Hematological: Negative for adenopathy. Does not bruise/bleed easily.  Psychiatric/Behavioral: Negative for confusion, depression and sleep disturbance. The patient is not nervous/anxious.     PHYSICAL EXAMINATION:  Blood pressure (!) 145/66, pulse 66, temperature (!) 96.2 F (35.7 C), temperature source Tympanic, resp. rate 17, height $RemoveBe'5\' 5"'oJBJHoTXh$  (1.651 m), weight 88  lb 3.2 oz (40 kg), SpO2 98 %.  ECOG PERFORMANCE STATUS: 1  Physical Exam  Constitutional: Oriented to person, place, and time and thin appearing female and in no distress.  HENT:  Head: Normocephalic and atraumatic.  Mouth/Throat: Oropharynx is clear and moist. No oropharyngeal exudate.  Eyes: Conjunctivae are normal. Right eye exhibits no discharge. Left eye exhibits no discharge. No scleral icterus.  Neck: Normal range of motion. Neck supple.  Cardiovascular: Normal rate, regular rhythm, normal heart sounds and intact distal pulses.   Pulmonary/Chest: Effort normal and breath sounds normal. No respiratory distress. No wheezes. No rales.  Abdominal: Soft. Bowel sounds are normal. Exhibits no distension and no mass. There is no tenderness.  Musculoskeletal: Unable to abduct arm greater than 90 degrees. Exhibits no edema.  Lymphadenopathy:    No cervical adenopathy.  Neurological: Alert and oriented to person, place, and time.  Muscle wasting gait normal.  Coordination normal.  Skin: Skin is warm and dry. No rash noted. Not diaphoretic. No erythema. No pallor.  Psychiatric: Mood, memory and judgment normal.  Vitals reviewed.  LABORATORY DATA: Lab Results  Component Value Date   WBC 7.0 01/20/2021   HGB 10.9 (L) 01/20/2021   HCT 33.8 (L) 01/20/2021   MCV 88.7 01/20/2021   PLT 372 01/20/2021      Chemistry      Component Value Date/Time   NA 136 01/20/2021 0854   K 3.8 01/20/2021 0854   CL 101 01/20/2021 0854   CO2 23 01/20/2021 0854   BUN 18 01/20/2021 0854   CREATININE 0.65 01/20/2021 0854      Component Value Date/Time   CALCIUM 9.1 01/20/2021 0854   ALKPHOS 143 (H) 01/20/2021 0854   AST 29 01/20/2021 0854   ALT 37 01/20/2021 0854   BILITOT 0.4 01/20/2021 0854       RADIOGRAPHIC STUDIES:  DG Chest 2 View  Result Date: 12/26/2020 CLINICAL DATA:  SOB EXAM: CHEST - 2 VIEW COMPARISON:  October 20, 2020. FINDINGS: Masslike opacity in the medial right upper lobe with mildly improved surrounding right upper lobe opacification volume loss. Left lung is clear. Probable small right pleural effusion. Lower thoracic vertebral body compression fracture with similar height loss to CT from October 21, 2018. Cardiac silhouette is within normal limits. IMPRESSION: 1. Masslike opacity in the medial right upper lobe with mildly improved surrounding right upper lobe opacification volume loss, compatible with known right upper lobe mass and postobstructive atelectasis and/or pneumonia. 2. Probable small right pleural effusion. 3. Lower thoracic vertebral body compression fracture with similar height loss to CT from October 21, 2018. Electronically Signed   By: Margaretha Sheffield MD   On: 12/26/2020 11:21   DG Shoulder Right  Result Date: 01/03/2021 CLINICAL DATA:  Right shoulder pain for 2 days EXAM: RIGHT SHOULDER - 2+ VIEW COMPARISON:  None. FINDINGS: There is no acute fracture or dislocation. There is mild irregularity of the greater tuberosity. The  humeral head is somewhat high-riding within the glenoid. There is mild glenohumeral and acromioclavicular joint space narrowing. There is associated osteophytosis about the acromioclavicular joint. IMPRESSION: 1. No acute fracture or dislocation. 2. Irregularity of the greater tuberosity and high-riding humeral head suggesting rotator cuff pathology. Electronically Signed   By: Valetta Mole M.D.   On: 01/03/2021 13:18   CT Angio Chest PE W and/or Wo Contrast  Result Date: 01/01/2021 CLINICAL DATA:  History of lung carcinoma and recent bronchoscopy with shortness of breath EXAM: CT ANGIOGRAPHY CHEST WITH CONTRAST  TECHNIQUE: Multidetector CT imaging of the chest was performed using the standard protocol during bolus administration of intravenous contrast. Multiplanar CT image reconstructions and MIPs were obtained to evaluate the vascular anatomy. CONTRAST:  38mL OMNIPAQUE IOHEXOL 350 MG/ML SOLN COMPARISON:  10/20/2020 CT FINDINGS: Cardiovascular: Thoracic aorta demonstrates normal branching pattern. Atherosclerotic calcifications are noted without dissection. Aneurysmal dilatation of the ascending aorta is noted to 4.4 cm. Descending thoracic aorta is normal in caliber. No significant cardiac enlargement is noted. No evidence of right heart strain is seen. The pulmonary artery is not as well opacified as the aorta although filling defects are noted in branches of the right lower lobe and left lower lobe. No definitive upper lobe filling defects are seen although the right upper lobe pulmonary arterial branches are significantly attenuated by the known underlying mass lesion. Mediastinum/Nodes: Thoracic inlet is within normal limits. Known lymphadenopathy in the precarinal and right paratracheal region are less well evaluated on the current exam but appears stable. This is mostly related to the timing of the contrast bolus being arterial in nature. The known right upper lobe mass lesion extends into the superior  aspect of the right hilum with likely underlying small confluent lymph nodes present. The esophagus as visualized is within normal limits. Lungs/Pleura: Left lung is well aerated without evidence of focal infiltrate or effusion. Right lung demonstrates the large centrally necrotic and partially cavitary mass lesion along the posterior aspect of the right upper lobe bowing the major fissure similar to that noted on the prior exam. It measures approximately 6.7 x 6.3 cm in greatest dimension and given the slight variation in the imaging technique is relatively stable. Small right-sided pleural effusion is noted new from the prior CT examination. No other focal parenchymal nodules are seen. Upper Abdomen: A few small peripherally enhancing mass lesions are noted in the right lobe of the liver inferiorly consistent with metastatic disease. Largest of these is noted posteriorly in the right lobe of the liver measuring approximately 3.6 cm and not as well visualized on the prior CT examination. The right adrenal gland is significantly enlarged similar to that noted on prior exam consistent with metastatic disease. No other focal abnormality is noted. Musculoskeletal: There is an expansile lesion of the anterolateral right third rib which was not present on the prior exam from 10/20/2020 consistent with a very aggressive metastatic process. No other rib lesions are seen. Progressive mottled metastatic disease is noted involving T8 and T12 with progressive compression deformity when compared with the prior CT examination. Mild compression deformity is noted at T4 as well but appears stable from the prior exam. Lytic lesion is noted in the central portion of the left clavicle with some destruction of the cortex medially Review of the MIP images confirms the above findings. IMPRESSION: Changes consistent with mild pulmonary embolism without evidence of right heart strain. These are predominately in the lower lobe branches  bilaterally. Large cavitary in centrally necrotic mass lesion in the right upper lobe similar to that seen on prior CT examination consistent with the known history of carcinoma of the lung. Associated mediastinal adenopathy as well as hepatic, adrenal and bony metastatic disease is noted. The hepatic and bony metastatic disease has progressed significantly in the interval from the prior CT. New small right-sided pleural effusion. Aortic dilatation of the ascending aorta as described. Recommend annual imaging followup by CTA or MRA. This recommendation follows 2010 ACCF/AHA/AATS/ACR/ASA/SCA/SCAI/SIR/STS/SVM Guidelines for the Diagnosis and Management of Patients with Thoracic Aortic Disease. Circulation.  2010; 121: Q734-L937. Aortic aneurysm NOS (ICD10-I71.9) Aortic Atherosclerosis (ICD10-I70.0). Critical Value/emergent results were called by telephone at the time of interpretation on 01/01/2021 at 9:02 pm to Dr. Campbell Stall , who verbally acknowledged these results. Electronically Signed   By: Inez Catalina M.D.   On: 01/01/2021 17:31   CT Lumbar Spine Wo Contrast  Result Date: 01/01/2021 CLINICAL DATA:  Bone neoplasm, lumbosacral spine, recurrent suspected. Metastatic lung cancer. EXAM: CT LUMBAR SPINE WITHOUT CONTRAST TECHNIQUE: Multidetector CT imaging of the lumbar spine was performed without intravenous contrast administration. Multiplanar CT image reconstructions were also generated. COMPARISON:  PET scan 11/09/2020.  CT abdomen 10/20/2020. FINDINGS: Segmentation: 5 lumbar type vertebral bodies assumed. Alignment: No significant malalignment. Vertebrae: Previously seen inferior endplate fracture at I09 has progressed slightly since the study of 10/21/2018. Vertebral body height anteriorly now measures 13 mm compared with 16 mm on the previous study. Mild superior endplate depression now present as well. Posterior bowing of the posterior margin of the vertebral body. Relative lytic change of the vertebral  body does raise suspicion that this could be a pathologic fracture. MRI with and without contrast suggested when able. Paraspinal and other soft tissues: See results abdominal CT. Disc levels: Mild encroachment upon the canal at the T12 level but without definite neural compression. No significant disc space pathology at L2-3 or above. L3-4: Disc bulge. Mild facet hypertrophy. Mild bilateral lateral recess stenosis. L4-5: Endplate osteophytes. Disc bulge. Facet and ligamentous hypertrophy. Moderate multifactorial spinal stenosis that could cause neural compression. L5-S1: Disc bulge. Facet degeneration. No likely neural compression. IMPRESSION: Slight progression of a compression fracture at T12 since the previous CT study of 10/20/2020. Some lytic change of the posterior vertebral body. This could be a pathologic fracture. MRI with contrast recommended when able. Mild encroachment upon the canal but no definite compressive stenosis. L4-5: Moderate multifactorial spinal stenosis that could cause neural compression. Degenerative disc disease and degenerative facet disease at other levels throughout the lumbar region as described above which could be associated with regional back pain. Electronically Signed   By: Nelson Chimes M.D.   On: 01/01/2021 16:34   MR CERVICAL SPINE W WO CONTRAST  Result Date: 01/03/2021 CLINICAL DATA:  Acute neck pain EXAM: MRI CERVICAL SPINE WITHOUT AND WITH CONTRAST TECHNIQUE: Multiplanar and multiecho pulse sequences of the cervical spine, to include the craniocervical junction and cervicothoracic junction, were obtained without and with intravenous contrast. CONTRAST:  47mL GADAVIST GADOBUTROL 1 MMOL/ML IV SOLN COMPARISON:  None. FINDINGS: Alignment: Reversal of normal cervical lordosis. There is grade 1 anterolisthesis at C4-5 and grade 1 retrolisthesis at C5-6 and C6-7. Vertebrae: C5-6 bone marrow T1-weighted signal abnormality, favored to be degenerative. This signal of the disc is  normal. There is minimal edema. No mass or evidence of discitis-osteomyelitis. Cord: Normal signal and morphology. Posterior Fossa, vertebral arteries, paraspinal tissues: Small prevertebral effusion. Disc levels: C1-2: Unremarkable. C2-3: Disc desiccation and mild facet hypertrophy. There is no spinal canal stenosis. No neural foraminal stenosis. C3-4: Disc desiccation with small bulge and right-greater-than-left uncovertebral osteophytes. There is no spinal canal stenosis. Mild bilateral neural foraminal stenosis. C4-5: Right subarticular disc osteophyte complex. Mild spinal canal stenosis. Moderate right and mild left neural foraminal stenosis. C5-6: Large C5 inferior endplate osteophyte and central disc extrusion with inferior migration. Severe spinal canal stenosis. There is compression of the spinal cord without signal change. Severe bilateral neural foraminal stenosis. C6-7: Intermediate sized disc bulge with endplate spurring. Moderate spinal canal stenosis. Severe bilateral neural  foraminal stenosis. C7-T1: Normal disc space and facet joints. There is no spinal canal stenosis. No neural foraminal stenosis. IMPRESSION: 1. Large C5-6 inferior endplate osteophyte and central disc extrusion with inferior migration causes severe spinal canal stenosis and compression of the spinal cord without signal change. 2. Moderate spinal canal stenosis and severe bilateral neural foraminal stenosis at C6-7. 3. Moderate right C4-5 neural foraminal stenosis. 4. C5-6 bone marrow signal changes favored to be degenerative given normal disc signal. 5. Small nonspecific prevertebral effusion. Critical Value/emergent results were called by telephone at the time of interpretation on 01/03/2021 at 7:59 pm to provider Gershon Cull, who verbally acknowledged these results. Electronically Signed   By: Ulyses Jarred M.D.   On: 01/03/2021 20:02   DG CHEST PORT 1 VIEW  Result Date: 01/05/2021 CLINICAL DATA:  Community acquired  pneumonia, shortness of breath EXAM: PORTABLE CHEST 1 VIEW COMPARISON:  Chest radiograph 01/01/2021, CT chest 01/01/2021 FINDINGS: The cardiomediastinal silhouette is stable. Confluent opacity in the right apex with central lucency is similar to the prior study, consistent with the consolidation and cavitary lesion seen on the prior CT chest. A small right pleural effusion is slightly increased in size with slightly worsened aeration of the right base. There is a small left pleural effusion with adjacent airspace disease likely reflecting subsegmental atelectasis, new in the interim. The left lung is otherwise clear. The bones are stable. IMPRESSION: 1. Slight interval increase in size of a small right pleural effusion and new trace left pleural effusion with slightly worsened aeration in the lung bases. 2. Grossly similar appearance of the cavitary mass and surrounding opacity in the right upper lobe. Electronically Signed   By: Valetta Mole M.D.   On: 01/05/2021 14:51   DG Chest Portable 1 View  Result Date: 01/01/2021 CLINICAL DATA:  Shortness of breath. Patient had first chemo treatment for lung cancer few days ago. Difficulty eating and drinking. EXAM: PORTABLE CHEST 1 VIEW COMPARISON:  December 26, 2020 FINDINGS: Bilateral skin folds are identified. No pneumothorax. Known malignancy in the upper medial right chest. No new nodules, masses, or focal infiltrates. The cardiomediastinal silhouette is stable. IMPRESSION: Known malignancy in the medial right upper lobe. No other interval changes or acute abnormalities. Electronically Signed   By: Dorise Bullion III M.D.   On: 01/01/2021 16:11     ASSESSMENT/PLAN:  This is a very pleasant 75 year old Caucasian female diagnosed with stage IV (T3, N2, M1 C) non-small cell lung cancer with unknown histologic subtype questionably likely to be squamous cell carcinoma based on the morphology of the right upper lobe cavitary mass with mediastinal lymphadenopathy  metastatic disease to the bones as well as the right adrenal gland.  She was diagnosed in June 2022.  Adenocarcinoma cannot be completely ruled out especially with the K-ras G 12 C mutation.  Since the patient has an actionable mutation with K-ras G 12 C, this can be used as an option in the second line setting.  The patient completed palliative radiotherapy to multiple metastatic bone lesions under the care of Dr. Sondra Come.  The patient is currently undergoing systemic chemotherapy with carboplatin AUC 5, Alimta 500 mg per metered squared, Keytruda 200 mg IV every 3 weeks.  She is status post 1 cycle.  The patient was seen with Dr. Julien Nordmann today.  Labs were reviewed.  Recommend that she proceed with cycle #2 today scheduled.  We will see her back for follow-up visit in 3 weeks for evaluation before starting cycle #3.  We will start her on Xgeva every 6 weeks due to the metastatic disease to the bone.  The patient does not like receiving injections after having some associated arm pain incidentally after her B12 injection.  Discussed that if she prefers, this can be administered in the abdomen.  There was a drug to drug interaction when Zometa was initially entered; therefore, we will use Xgeva instead.  Advised to start taking calcium supplements.  Reviewed the small risk of osteonecrosis to the jaw with Xgeva.  The patient is not sure if she needs a refill of her pain medication and they will call us if a refill is required.  She will continue taking morphine twice daily and hydromorphone if needed for breakthrough pain.  She will follow-up with orthopedics on 01/26/2021 for the decreased mobility of the right shoulder.  She will continue taking Remeron for decreased appetite.  Megace was discontinued due to her recent small pulmonary embolism although she had not started taking it yet.  Encouraged her to continue being as active as possible and to proceed with physical therapy and Occupational Therapy  as planned.  She will continue drinking formula 3 times a day as well as an additional protein shake.  The patient was advised to call immediately if she has any concerning symptoms in the interval. The patient voices understanding of current disease status and treatment options and is in agreement with the current care plan. All questions were answered. The patient knows to call the clinic with any problems, questions or concerns. We can certainly see the patient much sooner if necessary   No orders of the defined types were placed in this encounter.    Breeann Reposa L Joline Encalada, PA-C 01/20/21  ADDENDUM: Hematology/Oncology Attending: I had a face-to-face encounter with the patient today.  I reviewed her records, lab and recommended her care plan.  This is a very pleasant 75 years old white female diagnosed with stage IV (T3, N2, M1 C) non-small cell lung cancer of unknown histologic subtype but likely adenocarcinoma based on the molecular studies and positive KRAS G12C mutation.  This was diagnosed in June 2022. The patient is currently undergoing systemic chemotherapy with carboplatin, Alimta and Keytruda every 3 weeks status post 1 cycle.  She is feeling much better after starting the first cycle of her treatment with less fatigue and weakness and less pain. I recommended for her to proceed with cycle #2 today as planned. I will see the patient back for follow-up visit in 3 weeks for evaluation before starting cycle #3. The patient was advised to call immediately if she has any other concerning symptoms in the interval. Disclaimer: This note was dictated with voice recognition software. Similar sounding words can inadvertently be transcribed and may be missed upon review. Eilleen Kempf, MD 01/20/21

## 2021-01-18 NOTE — Telephone Encounter (Signed)
Palliative Medicine RN Note: Rec'd a call yesterday from pt's friend Kristi Jennings, who is listed in the demographics. She had questions about if our team is going to follow Kristi Jennings after discharge. She had gone and picked up medical records and was able to see on her end some of the documentation.  I explained that [facility] should be calling (they have already reached out) for palliative care follow up, but pt will be followed by home health and her oncologist also.   I will email Kristi Jennings a copy of Hard Choices, MOST, and HCPOA just so they know what topics would be asked about when discussing disease progression.  Kristi Skiff Tanav Orsak, RN, BSN, [facility] Palliative Medicine Team 01/18/2021 8:35 AM Office [phone]

## 2021-01-20 ENCOUNTER — Encounter: Payer: Self-pay | Admitting: General Practice

## 2021-01-20 ENCOUNTER — Inpatient Hospital Stay: Payer: Medicare HMO

## 2021-01-20 ENCOUNTER — Other Ambulatory Visit: Payer: Self-pay

## 2021-01-20 ENCOUNTER — Encounter: Payer: Medicare HMO | Admitting: Nutrition

## 2021-01-20 ENCOUNTER — Inpatient Hospital Stay: Payer: Medicare HMO | Attending: Internal Medicine | Admitting: Physician Assistant

## 2021-01-20 ENCOUNTER — Telehealth: Payer: Self-pay | Admitting: Internal Medicine

## 2021-01-20 VITALS — BP 145/66 | HR 66 | Temp 96.2°F | Resp 17 | Ht 65.0 in | Wt 88.2 lb

## 2021-01-20 DIAGNOSIS — C3411 Malignant neoplasm of upper lobe, right bronchus or lung: Secondary | ICD-10-CM | POA: Insufficient documentation

## 2021-01-20 DIAGNOSIS — C3491 Malignant neoplasm of unspecified part of right bronchus or lung: Secondary | ICD-10-CM

## 2021-01-20 DIAGNOSIS — I10 Essential (primary) hypertension: Secondary | ICD-10-CM | POA: Insufficient documentation

## 2021-01-20 DIAGNOSIS — Z5111 Encounter for antineoplastic chemotherapy: Secondary | ICD-10-CM | POA: Insufficient documentation

## 2021-01-20 DIAGNOSIS — C7951 Secondary malignant neoplasm of bone: Secondary | ICD-10-CM | POA: Insufficient documentation

## 2021-01-20 DIAGNOSIS — C7971 Secondary malignant neoplasm of right adrenal gland: Secondary | ICD-10-CM | POA: Diagnosis not present

## 2021-01-20 DIAGNOSIS — Z5112 Encounter for antineoplastic immunotherapy: Secondary | ICD-10-CM | POA: Insufficient documentation

## 2021-01-20 DIAGNOSIS — Z7982 Long term (current) use of aspirin: Secondary | ICD-10-CM | POA: Insufficient documentation

## 2021-01-20 DIAGNOSIS — Z79899 Other long term (current) drug therapy: Secondary | ICD-10-CM | POA: Diagnosis not present

## 2021-01-20 DIAGNOSIS — C771 Secondary and unspecified malignant neoplasm of intrathoracic lymph nodes: Secondary | ICD-10-CM | POA: Insufficient documentation

## 2021-01-20 LAB — CMP (CANCER CENTER ONLY)
ALT: 37 U/L (ref 0–44)
AST: 29 U/L (ref 15–41)
Albumin: 3.5 g/dL (ref 3.5–5.0)
Alkaline Phosphatase: 143 U/L — ABNORMAL HIGH (ref 38–126)
Anion gap: 12 (ref 5–15)
BUN: 18 mg/dL (ref 8–23)
CO2: 23 mmol/L (ref 22–32)
Calcium: 9.1 mg/dL (ref 8.9–10.3)
Chloride: 101 mmol/L (ref 98–111)
Creatinine: 0.65 mg/dL (ref 0.44–1.00)
GFR, Estimated: >60 mL/min (ref >60)
Glucose, Bld: 110 mg/dL — ABNORMAL HIGH (ref 70–99)
Potassium: 3.8 mmol/L (ref 3.5–5.1)
Sodium: 136 mmol/L (ref 135–145)
Total Bilirubin: 0.4 mg/dL (ref 0.3–1.2)
Total Protein: 6.9 g/dL (ref 6.5–8.1)

## 2021-01-20 LAB — CBC WITH DIFFERENTIAL (CANCER CENTER ONLY)
Abs Immature Granulocytes: 0.06 10*3/uL (ref 0.00–0.07)
Basophils Absolute: 0.1 10*3/uL (ref 0.0–0.1)
Basophils Relative: 1 %
Eosinophils Absolute: 0.1 10*3/uL (ref 0.0–0.5)
Eosinophils Relative: 1 %
HCT: 33.8 % — ABNORMAL LOW (ref 36.0–46.0)
Hemoglobin: 10.9 g/dL — ABNORMAL LOW (ref 12.0–15.0)
Immature Granulocytes: 1 %
Lymphocytes Relative: 19 %
Lymphs Abs: 1.4 10*3/uL (ref 0.7–4.0)
MCH: 28.6 pg (ref 26.0–34.0)
MCHC: 32.2 g/dL (ref 30.0–36.0)
MCV: 88.7 fL (ref 80.0–100.0)
Monocytes Absolute: 0.9 10*3/uL (ref 0.1–1.0)
Monocytes Relative: 13 %
Neutro Abs: 4.6 10*3/uL (ref 1.7–7.7)
Neutrophils Relative %: 65 %
Platelet Count: 372 10*3/uL (ref 150–400)
RBC: 3.81 MIL/uL — ABNORMAL LOW (ref 3.87–5.11)
RDW: 17.3 % — ABNORMAL HIGH (ref 11.5–15.5)
WBC Count: 7 10*3/uL (ref 4.0–10.5)
nRBC: 0 % (ref 0.0–0.2)

## 2021-01-20 LAB — TSH: TSH: 5.407 u[IU]/mL — ABNORMAL HIGH (ref 0.308–3.960)

## 2021-01-20 MED ORDER — SODIUM CHLORIDE 0.9 % IV SOLN
500.0000 mg/m2 | Freq: Once | INTRAVENOUS | Status: AC
  Administered 2021-01-20: 700 mg via INTRAVENOUS
  Filled 2021-01-20: qty 8

## 2021-01-20 MED ORDER — SODIUM CHLORIDE 0.9 % IV SOLN
304.5000 mg | Freq: Once | INTRAVENOUS | Status: AC
  Administered 2021-01-20: 300 mg via INTRAVENOUS
  Filled 2021-01-20: qty 30

## 2021-01-20 MED ORDER — PALONOSETRON HCL INJECTION 0.25 MG/5ML
0.2500 mg | Freq: Once | INTRAVENOUS | Status: AC
  Administered 2021-01-20: 0.25 mg via INTRAVENOUS
  Filled 2021-01-20: qty 5

## 2021-01-20 MED ORDER — SODIUM CHLORIDE 0.9 % IV SOLN
200.0000 mg | Freq: Once | INTRAVENOUS | Status: AC
  Administered 2021-01-20: 200 mg via INTRAVENOUS
  Filled 2021-01-20: qty 8

## 2021-01-20 MED ORDER — SODIUM CHLORIDE 0.9 % IV SOLN
150.0000 mg | Freq: Once | INTRAVENOUS | Status: AC
  Administered 2021-01-20: 150 mg via INTRAVENOUS
  Filled 2021-01-20: qty 150

## 2021-01-20 MED ORDER — SODIUM CHLORIDE 0.9 % IV SOLN
10.0000 mg | Freq: Once | INTRAVENOUS | Status: AC
  Administered 2021-01-20: 10 mg via INTRAVENOUS
  Filled 2021-01-20: qty 10

## 2021-01-20 MED ORDER — SODIUM CHLORIDE 0.9 % IV SOLN: Freq: Once | INTRAVENOUS | Status: AC

## 2021-01-20 NOTE — Telephone Encounter (Signed)
Scheduled appt per 9/1 los - pt to get an updated schedule in infusion .

## 2021-01-20 NOTE — Patient Instructions (Signed)
[facility]   Discharge Instructions: Thank you for choosing [facility] to provide your oncology and hematology care.   If you have a lab appointment with the [facility], please go directly to the [facility] and check in at the registration area.   Wear comfortable clothing and clothing appropriate for easy access to any Portacath or PICC line.   We strive to give you quality time with your provider. You may need to reschedule your appointment if you arrive late (15 or more minutes).  Arriving late affects you and other patients whose appointments are after yours.  Also, if you miss three or more appointments without notifying the office, you may be dismissed from the clinic at the provider's discretion.      For prescription refill requests, have your pharmacy contact our office and allow 72 hours for refills to be completed.    Today you received the following chemotherapy and/or immunotherapy agents: pembrolizumab/pemetrexed/carboplatin.      To help prevent nausea and vomiting after your treatment, we encourage you to take your nausea medication as directed.  BELOW ARE SYMPTOMS THAT SHOULD BE REPORTED IMMEDIATELY: *FEVER GREATER THAN 100.4 F (38 C) OR HIGHER *CHILLS OR SWEATING *NAUSEA AND VOMITING THAT IS NOT CONTROLLED WITH YOUR NAUSEA MEDICATION *UNUSUAL SHORTNESS OF BREATH *UNUSUAL BRUISING OR BLEEDING *URINARY PROBLEMS (pain or burning when urinating, or frequent urination) *BOWEL PROBLEMS (unusual diarrhea, constipation, pain near the anus) TENDERNESS IN MOUTH AND THROAT WITH OR WITHOUT PRESENCE OF ULCERS (sore throat, sores in mouth, or a toothache) UNUSUAL RASH, SWELLING OR PAIN  UNUSUAL VAGINAL DISCHARGE OR ITCHING   Items with * indicate a potential emergency and should be followed up as soon as possible or go to the Emergency Department if any problems should occur.  Please show the CHEMOTHERAPY ALERT CARD or  IMMUNOTHERAPY ALERT CARD at check-in to the Emergency Department and triage nurse.  Should you have questions after your visit or need to cancel or reschedule your appointment, please contact [facility]  Dept: [phone]  and follow the prompts.  Office hours are 8:00 a.m. to 4:30 p.m. Monday - Friday. Please note that voicemails left after 4:00 p.m. may not be returned until the following business day.  We are closed weekends and major holidays. You have access to a nurse at all times for urgent questions. Please call the main number to the clinic Dept: [phone] and follow the prompts.   For any non-urgent questions, you may also contact your provider using MyChart. We now offer e-Visits for anyone 86 and older to request care online for non-urgent symptoms. For details visit [email or web address].   Also download the MyChart app! Go to the app store, search "MyChart", open the app, select [facility], and log in with your MyChart username and password.  Due to Covid, a mask is required upon entering the hospital/clinic. If you do not have a mask, one will be given to you upon arrival. For doctor visits, patients may have 1 support person aged 37 or older with them. For treatment visits, patients cannot have anyone with them due to current Covid guidelines and our immunocompromised population.

## 2021-01-20 NOTE — Progress Notes (Signed)
[facility] Spiritual Care Note  Kristi Jennings was very relaxed, comfortable, and appreciative of pastoral check-in in infusion, but felt too drowsy to chat much. She reports no immediate needs or concerns. We plan to visit at the beginning of a future treatment session so that she will be more alert.   [place], [place], [facility] Pager [phone] Voicemail [phone]

## 2021-01-22 ENCOUNTER — Ambulatory Visit: Payer: Medicare HMO

## 2021-01-25 ENCOUNTER — Telehealth: Payer: Self-pay | Admitting: Internal Medicine

## 2021-01-25 NOTE — Telephone Encounter (Signed)
R/s appt per 9/6 sch msg. Pt aware.

## 2021-01-26 ENCOUNTER — Other Ambulatory Visit: Payer: Self-pay

## 2021-01-26 ENCOUNTER — Inpatient Hospital Stay: Payer: Medicare HMO

## 2021-01-26 DIAGNOSIS — I10 Essential (primary) hypertension: Secondary | ICD-10-CM | POA: Diagnosis not present

## 2021-01-26 DIAGNOSIS — C771 Secondary and unspecified malignant neoplasm of intrathoracic lymph nodes: Secondary | ICD-10-CM | POA: Diagnosis not present

## 2021-01-26 DIAGNOSIS — Z5112 Encounter for antineoplastic immunotherapy: Secondary | ICD-10-CM | POA: Diagnosis not present

## 2021-01-26 DIAGNOSIS — C3411 Malignant neoplasm of upper lobe, right bronchus or lung: Secondary | ICD-10-CM | POA: Diagnosis not present

## 2021-01-26 DIAGNOSIS — Z79899 Other long term (current) drug therapy: Secondary | ICD-10-CM | POA: Diagnosis not present

## 2021-01-26 DIAGNOSIS — Z7982 Long term (current) use of aspirin: Secondary | ICD-10-CM | POA: Diagnosis not present

## 2021-01-26 DIAGNOSIS — M25511 Pain in right shoulder: Secondary | ICD-10-CM | POA: Diagnosis not present

## 2021-01-26 DIAGNOSIS — C3491 Malignant neoplasm of unspecified part of right bronchus or lung: Secondary | ICD-10-CM

## 2021-01-26 DIAGNOSIS — Z5111 Encounter for antineoplastic chemotherapy: Secondary | ICD-10-CM | POA: Diagnosis not present

## 2021-01-26 DIAGNOSIS — C7951 Secondary malignant neoplasm of bone: Secondary | ICD-10-CM | POA: Diagnosis not present

## 2021-01-26 DIAGNOSIS — C7971 Secondary malignant neoplasm of right adrenal gland: Secondary | ICD-10-CM | POA: Diagnosis not present

## 2021-01-26 LAB — CBC WITH DIFFERENTIAL (CANCER CENTER ONLY)
Abs Immature Granulocytes: 0.03 10*3/uL (ref 0.00–0.07)
Basophils Absolute: 0 10*3/uL (ref 0.0–0.1)
Basophils Relative: 0 %
Eosinophils Absolute: 0 10*3/uL (ref 0.0–0.5)
Eosinophils Relative: 0 %
HCT: 34.5 % — ABNORMAL LOW (ref 36.0–46.0)
Hemoglobin: 11.3 g/dL — ABNORMAL LOW (ref 12.0–15.0)
Immature Granulocytes: 0 %
Lymphocytes Relative: 9 %
Lymphs Abs: 0.6 10*3/uL — ABNORMAL LOW (ref 0.7–4.0)
MCH: 28.8 pg (ref 26.0–34.0)
MCHC: 32.8 g/dL (ref 30.0–36.0)
MCV: 88 fL (ref 80.0–100.0)
Monocytes Absolute: 0.1 10*3/uL (ref 0.1–1.0)
Monocytes Relative: 2 %
Neutro Abs: 6 10*3/uL (ref 1.7–7.7)
Neutrophils Relative %: 89 %
Platelet Count: 186 10*3/uL (ref 150–400)
RBC: 3.92 MIL/uL (ref 3.87–5.11)
RDW: 17.4 % — ABNORMAL HIGH (ref 11.5–15.5)
WBC Count: 6.8 10*3/uL (ref 4.0–10.5)
nRBC: 0 % (ref 0.0–0.2)

## 2021-01-26 LAB — CMP (CANCER CENTER ONLY)
ALT: 20 U/L (ref 0–44)
AST: 19 U/L (ref 15–41)
Albumin: 3.8 g/dL (ref 3.5–5.0)
Alkaline Phosphatase: 151 U/L — ABNORMAL HIGH (ref 38–126)
Anion gap: 11 (ref 5–15)
BUN: 16 mg/dL (ref 8–23)
CO2: 26 mmol/L (ref 22–32)
Calcium: 9.1 mg/dL (ref 8.9–10.3)
Chloride: 98 mmol/L (ref 98–111)
Creatinine: 0.62 mg/dL (ref 0.44–1.00)
GFR, Estimated: >60 mL/min (ref >60)
Glucose, Bld: 95 mg/dL (ref 70–99)
Potassium: 3.4 mmol/L — ABNORMAL LOW (ref 3.5–5.1)
Sodium: 135 mmol/L (ref 135–145)
Total Bilirubin: 0.6 mg/dL (ref 0.3–1.2)
Total Protein: 7 g/dL (ref 6.5–8.1)

## 2021-01-26 NOTE — Progress Notes (Signed)
[facility]--The home-based palliative care division of [facility].  Received referral from Dr Hilma Favors upon d/c from [facility].  At spouse's request we delayed admission to give them time to get settled back at home and initiate [facility] services.  [facility] admission was completed on 01/25/21 and pt will be receiving home-based support by the nurse and social worker.  Please contact [facility] if we can be of assistance with pt's care.   [address] [place], [place]

## 2021-01-30 NOTE — Progress Notes (Signed)
Radiation Oncology         [phone] ________________________________  Name: Kristi Jennings MRN: [id number]  Date: 01/31/2021  DOB: Oct 03, 1945  Follow-Up Visit Note  CC: Andreas Blower., MD  Si Gaul, MD    ICD-10-CM   1. Non-small cell carcinoma of lung, stage 4, right [facility])  C34.91       Diagnosis: The encounter diagnosis was Non-small cell carcinoma of lung, stage 4, right (HCC).   Stage IV (T3, N2, M1 C) non-small cell lung cancer with unknown histologic subtype, likely squamous cell carcinoma based on morphology of right upper lobe cavitary mass. Diagnosed in June 2022  Interval Since Last Radiation: 1 month and 1 week   Treatment Dates: 4 treatment sessions on: 12/21/2020, 12/22/2020, 12/23/2020, 12/24/2020  Site/Dose: Chest_Rt - 30.00 of 30.00Gy, 10 of 10 fractions delivered; 3.00 Gy/Fx  Narrative:  The patient returns today for routine follow-up. Since completion of her radiation treatment directed at her right chest on 12/24/20, the patient met with Dr. [name] on 12/29/20 to begin the first cycle of her systemic chemotherapy with carboplatin for AUC of 5, Alimta 500 Mg/M2 and Keytruda 200 Mg IV every 3 weeks. During this visit, the patient also presented with the compliant of pain to her neck area radiating to her right shoulder (for which she presented to the ED for on 12/26/20). She also noticed some subcutaneous nodules that likely reflect metastatic disease in the neck area (per Dr. Asa Lente note).               Unfortunately, the patient soon after presented to the ED on 01/01/21 with decreased oral intake, chest pain, as well as weakness, associated with 7/10 pain to her lower back, fatigue, and generalized malaise. CT angiogram of the chest performed during ED course showed changes consistent with pulmonary embolism with no right heart strain (noted as bilateral in the lower lobes).  Evidence was also seen of the known large cavitary necrotic mass in the right  upper lobe (similar to prior imaging). Subsequently, the patient was admitted for further evaluation and initiation of heparin drip. She was later discharged on 01/06/21, with her discharge diagnosis being pulmonary emboli.   MRI of the cervical spine taken during her hospital admission on 01/03/21 revealed large C5-6 inferior endplate osteophyte and central disk extrusion with inferior migration, causing severe spinal canal stenosis and compression of the spinal cord without signal change. Also seen was moderate spinal canal stenosis and severe bilateral neural foraminal stenosis at C6-7.        Chest x-ray performed on 01/05/21 revealed slight interval increase in the size of a small right pleural effusion, and new trace left pleural effusion with slightly worsened aeration in the lung bases. The cavitary mass and surrounding opacity in the right upper lobe appeared to be similar to prior imaging.   In most recent history, the patient followed up with Dr. Theodis Blaze on 01/20/21 prior to starting cycle 2 of her treatment. During this visit, the patient reported her appetite to have improved (decrease in appetite has been noted by various providers). The patient also stated that she has some dyspnea on exertion with certain chores, though denies cough, chest pain, or hemoptysis. Overall, she reported mild improvement of her various symptoms.   She denies any pain along the right chest region or left lower chest upper abdomen area that she had previously.  He does report having problems with her right shoulder and has seen orthopedic surgery.  Impression is she may have a rotator cuff injury and is scheduled for an MRI in the near future.  She reports this happening soon after her vitamin B12 injection.  Allergies:  is allergic to shellfish-derived products and augmentin [amoxicillin-pot clavulanate].  Meds: Current Outpatient Medications  Medication Sig Dispense Refill   acetaminophen (TYLENOL) 650  MG CR tablet Take 1,300 mg by mouth 3 (three) times daily as needed for pain.     APIXABAN (ELIQUIS) VTE STARTER PACK ($RemoveBefor'10MG'CzBMOTcWPSfH$  AND $Re'5MG'Xar$ ) Take as directed on package: start with two-$RemoveBefore'5mg'opzLRpqUYtkSu$  tablets twice daily for 4 days. On day 5, switch to one-$RemoveBefor'5mg'ZEaredSUJLqg$  tablet twice daily. 1 each 0   aspirin EC 81 MG tablet Take 81 mg by mouth 4 (four) times a week. Swallow whole.     atenolol (TENORMIN) 25 MG tablet Take 12.5 mg by mouth at bedtime.     dexamethasone (DECADRON) 4 MG tablet Take 1 tablet (4 mg total) by mouth daily. 30 tablet 0   diclofenac Sodium (VOLTAREN) 1 % GEL Apply 4 g topically 4 (four) times daily. (Patient taking differently: Apply 4 g topically 4 (four) times daily as needed (pain).) 150 g 0   escitalopram (LEXAPRO) 20 MG tablet Take 20 mg by mouth at bedtime.     folic acid (FOLVITE) 1 MG tablet Take 1 tablet (1 mg total) by mouth daily. 30 tablet 2   HYDROmorphone (DILAUDID) 2 MG tablet 2-4 mg po q6hr for breakthrough pain 30 tablet 0   mirtazapine (REMERON) 15 MG tablet Take 1 tablet (15 mg total) by mouth at bedtime. 30 tablet 0   rosuvastatin (CRESTOR) 10 MG tablet Take 10 mg by mouth at bedtime.     morphine (MS CONTIN) 30 MG 12 hr tablet Take 1 tablet (30 mg total) by mouth every 12 (twelve) hours. 60 tablet 0   ondansetron (ZOFRAN-ODT) 4 MG disintegrating tablet Take 4 mg by mouth 2 (two) times daily as needed for nausea or vomiting. (Patient not taking: Reported on 01/31/2021)     prochlorperazine (COMPAZINE) 10 MG tablet Take 1 tablet (10 mg total) by mouth every 6 (six) hours as needed for nausea or vomiting. (Patient not taking: No sig reported) 30 tablet 0   No current facility-administered medications for this encounter.    Physical Findings: The patient is in no acute distress. Patient is alert and oriented.  height is $RemoveB'5\' 5"'ujWintra$  (1.651 m) and weight is 87 lb 4 oz (39.6 kg). Her temporal temperature is 97.3 F (36.3 C) (abnormal). Her blood pressure is 125/76 and her pulse is 63. Her  respiration is 18 and oxygen saturation is 99%. .  No significant changes. Lungs are clear to auscultation bilaterally. Heart has regular rate and rhythm. No palpable cervical, supraclavicular, or axillary adenopathy. Abdomen soft, non-tender, normal bowel sounds.    Lab Findings: Lab Results  Component Value Date   WBC 6.8 01/26/2021   HGB 11.3 (L) 01/26/2021   HCT 34.5 (L) 01/26/2021   MCV 88.0 01/26/2021   PLT 186 01/26/2021    Radiographic Findings: DG Shoulder Right  Result Date: 01/03/2021 CLINICAL DATA:  Right shoulder pain for 2 days EXAM: RIGHT SHOULDER - 2+ VIEW COMPARISON:  None. FINDINGS: There is no acute fracture or dislocation. There is mild irregularity of the greater tuberosity. The humeral head is somewhat high-riding within the glenoid. There is mild glenohumeral and acromioclavicular joint space narrowing. There is associated osteophytosis about the acromioclavicular joint. IMPRESSION: 1. No acute fracture or dislocation. 2. Irregularity  of the greater tuberosity and high-riding humeral head suggesting rotator cuff pathology. Electronically Signed   By: Valetta Mole M.D.   On: 01/03/2021 13:18   MR CERVICAL SPINE W WO CONTRAST  Result Date: 01/03/2021 CLINICAL DATA:  Acute neck pain EXAM: MRI CERVICAL SPINE WITHOUT AND WITH CONTRAST TECHNIQUE: Multiplanar and multiecho pulse sequences of the cervical spine, to include the craniocervical junction and cervicothoracic junction, were obtained without and with intravenous contrast. CONTRAST:  76mL GADAVIST GADOBUTROL 1 MMOL/ML IV SOLN COMPARISON:  None. FINDINGS: Alignment: Reversal of normal cervical lordosis. There is grade 1 anterolisthesis at C4-5 and grade 1 retrolisthesis at C5-6 and C6-7. Vertebrae: C5-6 bone marrow T1-weighted signal abnormality, favored to be degenerative. This signal of the disc is normal. There is minimal edema. No mass or evidence of discitis-osteomyelitis. Cord: Normal signal and morphology. Posterior  Fossa, vertebral arteries, paraspinal tissues: Small prevertebral effusion. Disc levels: C1-2: Unremarkable. C2-3: Disc desiccation and mild facet hypertrophy. There is no spinal canal stenosis. No neural foraminal stenosis. C3-4: Disc desiccation with small bulge and right-greater-than-left uncovertebral osteophytes. There is no spinal canal stenosis. Mild bilateral neural foraminal stenosis. C4-5: Right subarticular disc osteophyte complex. Mild spinal canal stenosis. Moderate right and mild left neural foraminal stenosis. C5-6: Large C5 inferior endplate osteophyte and central disc extrusion with inferior migration. Severe spinal canal stenosis. There is compression of the spinal cord without signal change. Severe bilateral neural foraminal stenosis. C6-7: Intermediate sized disc bulge with endplate spurring. Moderate spinal canal stenosis. Severe bilateral neural foraminal stenosis. C7-T1: Normal disc space and facet joints. There is no spinal canal stenosis. No neural foraminal stenosis. IMPRESSION: 1. Large C5-6 inferior endplate osteophyte and central disc extrusion with inferior migration causes severe spinal canal stenosis and compression of the spinal cord without signal change. 2. Moderate spinal canal stenosis and severe bilateral neural foraminal stenosis at C6-7. 3. Moderate right C4-5 neural foraminal stenosis. 4. C5-6 bone marrow signal changes favored to be degenerative given normal disc signal. 5. Small nonspecific prevertebral effusion. Critical Value/emergent results were called by telephone at the time of interpretation on 01/03/2021 at 7:59 pm to provider Gershon Cull, who verbally acknowledged these results. Electronically Signed   By: Ulyses Jarred M.D.   On: 01/03/2021 20:02   DG CHEST PORT 1 VIEW  Result Date: 01/05/2021 CLINICAL DATA:  Community acquired pneumonia, shortness of breath EXAM: PORTABLE CHEST 1 VIEW COMPARISON:  Chest radiograph 01/01/2021, CT chest 01/01/2021 FINDINGS:  The cardiomediastinal silhouette is stable. Confluent opacity in the right apex with central lucency is similar to the prior study, consistent with the consolidation and cavitary lesion seen on the prior CT chest. A small right pleural effusion is slightly increased in size with slightly worsened aeration of the right base. There is a small left pleural effusion with adjacent airspace disease likely reflecting subsegmental atelectasis, new in the interim. The left lung is otherwise clear. The bones are stable. IMPRESSION: 1. Slight interval increase in size of a small right pleural effusion and new trace left pleural effusion with slightly worsened aeration in the lung bases. 2. Grossly similar appearance of the cavitary mass and surrounding opacity in the right upper lobe. Electronically Signed   By: Valetta Mole M.D.   On: 01/05/2021 14:51    Impression:  The encounter diagnosis was Non-small cell carcinoma of lung, stage 4, right ([facility]).   Stage IV (T3, N2, M1 C) non-small cell lung cancer with unknown histologic subtype, likely squamous cell carcinoma based on morphology  of right upper lobe cavitary mass. Diagnosed in June 2022  Patient denies any residual side effects from her palliative radiation therapy directed to the right upper chest region.  She denies any pain issues at this time.  Plan: As needed follow-up in radiation oncology.  The patient will continue close follow-up with medical oncology and will continue on chemotherapy as above.    ____________________________________  Blair Promise, PhD, MD   This document serves as a record of services personally performed by Gery Pray, MD. It was created on his behalf by Roney Mans, a trained medical scribe. The creation of this record is based on the scribe's personal observations and the provider's statements to them. This document has been checked and approved by the attending provider.

## 2021-01-31 ENCOUNTER — Other Ambulatory Visit: Payer: Self-pay | Admitting: Internal Medicine

## 2021-01-31 ENCOUNTER — Other Ambulatory Visit: Payer: Self-pay

## 2021-01-31 ENCOUNTER — Ambulatory Visit
Admission: RE | Admit: 2021-01-31 | Discharge: 2021-01-31 | Disposition: A | Discharge status: Home / Self Care | Payer: Medicare HMO | Source: Ambulatory Visit | Attending: Radiation Oncology | Admitting: Radiation Oncology

## 2021-01-31 ENCOUNTER — Telehealth: Payer: Self-pay

## 2021-01-31 ENCOUNTER — Encounter: Payer: Self-pay | Admitting: Radiation Oncology

## 2021-01-31 DIAGNOSIS — I2699 Other pulmonary embolism without acute cor pulmonale: Secondary | ICD-10-CM | POA: Diagnosis not present

## 2021-01-31 DIAGNOSIS — M542 Cervicalgia: Secondary | ICD-10-CM | POA: Insufficient documentation

## 2021-01-31 DIAGNOSIS — R221 Localized swelling, mass and lump, neck: Secondary | ICD-10-CM | POA: Diagnosis not present

## 2021-01-31 DIAGNOSIS — M4802 Spinal stenosis, cervical region: Secondary | ICD-10-CM | POA: Diagnosis not present

## 2021-01-31 DIAGNOSIS — C3411 Malignant neoplasm of upper lobe, right bronchus or lung: Secondary | ICD-10-CM | POA: Diagnosis not present

## 2021-01-31 DIAGNOSIS — M47819 Spondylosis without myelopathy or radiculopathy, site unspecified: Secondary | ICD-10-CM | POA: Diagnosis not present

## 2021-01-31 DIAGNOSIS — Z923 Personal history of irradiation: Secondary | ICD-10-CM | POA: Insufficient documentation

## 2021-01-31 DIAGNOSIS — J90 Pleural effusion, not elsewhere classified: Secondary | ICD-10-CM | POA: Diagnosis not present

## 2021-01-31 DIAGNOSIS — Z79899 Other long term (current) drug therapy: Secondary | ICD-10-CM | POA: Diagnosis not present

## 2021-01-31 DIAGNOSIS — M50222 Other cervical disc displacement at C5-C6 level: Secondary | ICD-10-CM | POA: Insufficient documentation

## 2021-01-31 DIAGNOSIS — M2578 Osteophyte, vertebrae: Secondary | ICD-10-CM | POA: Insufficient documentation

## 2021-01-31 DIAGNOSIS — C3491 Malignant neoplasm of unspecified part of right bronchus or lung: Secondary | ICD-10-CM

## 2021-01-31 MED ORDER — MORPHINE SULFATE ER 30 MG PO TBCR: 30.0000 mg | EXTENDED_RELEASE_TABLET | Freq: Two times a day (BID) | ORAL | 0 refills | Status: DC

## 2021-01-31 NOTE — Progress Notes (Signed)
Kristi Jennings is here today for follow up post radiation to the lung.  Lung Side: Right  Does the patient complain of any of the following: Pain:Patient denies pain.  Shortness of breath w/wo exertion: No Cough: no Hemoptysis: no Pain with swallowing: no Swallowing/choking concerns: no Appetite: Patient reports a gradual improvement. Patient drinking 3 premier protein drinks.  Energy Level: Reports having a good energy level.  Post radiation skin Changes: skin remains intact.     Additional comments if applicable:  Vitals:   88/33/74 1127  BP: 125/76  Pulse: 63  Resp: 18  Temp: (!) 97.3 F (36.3 C)  TempSrc: Temporal  SpO2: 99%  Weight: 87 lb 4 oz (39.6 kg)  Height: 5\' 5"  (4.514 m)

## 2021-01-31 NOTE — Telephone Encounter (Signed)
Pt LM requesting a refill of Morphine 30mg  tablets.

## 2021-02-02 ENCOUNTER — Inpatient Hospital Stay: Payer: Medicare HMO

## 2021-02-02 ENCOUNTER — Other Ambulatory Visit: Payer: Self-pay

## 2021-02-02 DIAGNOSIS — C3491 Malignant neoplasm of unspecified part of right bronchus or lung: Secondary | ICD-10-CM

## 2021-02-02 DIAGNOSIS — C7971 Secondary malignant neoplasm of right adrenal gland: Secondary | ICD-10-CM | POA: Diagnosis not present

## 2021-02-02 DIAGNOSIS — C3411 Malignant neoplasm of upper lobe, right bronchus or lung: Secondary | ICD-10-CM | POA: Diagnosis not present

## 2021-02-02 DIAGNOSIS — C771 Secondary and unspecified malignant neoplasm of intrathoracic lymph nodes: Secondary | ICD-10-CM | POA: Diagnosis not present

## 2021-02-02 DIAGNOSIS — C7951 Secondary malignant neoplasm of bone: Secondary | ICD-10-CM | POA: Diagnosis not present

## 2021-02-02 DIAGNOSIS — Z5111 Encounter for antineoplastic chemotherapy: Secondary | ICD-10-CM | POA: Diagnosis not present

## 2021-02-02 DIAGNOSIS — Z5112 Encounter for antineoplastic immunotherapy: Secondary | ICD-10-CM | POA: Diagnosis not present

## 2021-02-02 DIAGNOSIS — I10 Essential (primary) hypertension: Secondary | ICD-10-CM | POA: Diagnosis not present

## 2021-02-02 DIAGNOSIS — Z79899 Other long term (current) drug therapy: Secondary | ICD-10-CM | POA: Diagnosis not present

## 2021-02-02 DIAGNOSIS — Z7982 Long term (current) use of aspirin: Secondary | ICD-10-CM | POA: Diagnosis not present

## 2021-02-02 LAB — CMP (CANCER CENTER ONLY)
ALT: 28 U/L (ref 0–44)
AST: 25 U/L (ref 15–41)
Albumin: 3.8 g/dL (ref 3.5–5.0)
Alkaline Phosphatase: 131 U/L — ABNORMAL HIGH (ref 38–126)
Anion gap: 10 (ref 5–15)
BUN: 10 mg/dL (ref 8–23)
CO2: 28 mmol/L (ref 22–32)
Calcium: 9 mg/dL (ref 8.9–10.3)
Chloride: 99 mmol/L (ref 98–111)
Creatinine: 0.57 mg/dL (ref 0.44–1.00)
GFR, Estimated: >60 mL/min (ref >60)
Glucose, Bld: 96 mg/dL (ref 70–99)
Potassium: 3.8 mmol/L (ref 3.5–5.1)
Sodium: 137 mmol/L (ref 135–145)
Total Bilirubin: 0.7 mg/dL (ref 0.3–1.2)
Total Protein: 6.8 g/dL (ref 6.5–8.1)

## 2021-02-02 LAB — CBC WITH DIFFERENTIAL (CANCER CENTER ONLY)
Abs Immature Granulocytes: 0.01 10*3/uL (ref 0.00–0.07)
Basophils Absolute: 0 10*3/uL (ref 0.0–0.1)
Basophils Relative: 0 %
Eosinophils Absolute: 0 10*3/uL (ref 0.0–0.5)
Eosinophils Relative: 0 %
HCT: 33.2 % — ABNORMAL LOW (ref 36.0–46.0)
Hemoglobin: 10.6 g/dL — ABNORMAL LOW (ref 12.0–15.0)
Immature Granulocytes: 0 %
Lymphocytes Relative: 12 %
Lymphs Abs: 0.6 10*3/uL — ABNORMAL LOW (ref 0.7–4.0)
MCH: 29 pg (ref 26.0–34.0)
MCHC: 31.9 g/dL (ref 30.0–36.0)
MCV: 91 fL (ref 80.0–100.0)
Monocytes Absolute: 0.3 10*3/uL (ref 0.1–1.0)
Monocytes Relative: 6 %
Neutro Abs: 3.8 10*3/uL (ref 1.7–7.7)
Neutrophils Relative %: 82 %
Platelet Count: 107 10*3/uL — ABNORMAL LOW (ref 150–400)
RBC: 3.65 MIL/uL — ABNORMAL LOW (ref 3.87–5.11)
RDW: 19.1 % — ABNORMAL HIGH (ref 11.5–15.5)
WBC Count: 4.7 10*3/uL (ref 4.0–10.5)
nRBC: 0 % (ref 0.0–0.2)

## 2021-02-06 DIAGNOSIS — C3491 Malignant neoplasm of unspecified part of right bronchus or lung: Secondary | ICD-10-CM | POA: Diagnosis not present

## 2021-02-09 ENCOUNTER — Other Ambulatory Visit: Payer: Self-pay | Admitting: Physician Assistant

## 2021-02-10 ENCOUNTER — Other Ambulatory Visit: Payer: Self-pay

## 2021-02-10 ENCOUNTER — Inpatient Hospital Stay: Payer: Medicare HMO | Admitting: Nutrition

## 2021-02-10 ENCOUNTER — Inpatient Hospital Stay: Payer: Medicare HMO

## 2021-02-10 ENCOUNTER — Inpatient Hospital Stay (HOSPITAL_BASED_OUTPATIENT_CLINIC_OR_DEPARTMENT_OTHER): Payer: Medicare HMO | Admitting: Internal Medicine

## 2021-02-10 ENCOUNTER — Encounter: Payer: Medicare HMO | Admitting: Nutrition

## 2021-02-10 ENCOUNTER — Encounter: Payer: Self-pay | Admitting: Internal Medicine

## 2021-02-10 VITALS — BP 153/73 | HR 63 | Temp 97.5°F | Resp 17 | Ht 65.0 in | Wt 91.9 lb

## 2021-02-10 DIAGNOSIS — I10 Essential (primary) hypertension: Secondary | ICD-10-CM | POA: Diagnosis not present

## 2021-02-10 DIAGNOSIS — C7951 Secondary malignant neoplasm of bone: Secondary | ICD-10-CM

## 2021-02-10 DIAGNOSIS — G893 Neoplasm related pain (acute) (chronic): Secondary | ICD-10-CM

## 2021-02-10 DIAGNOSIS — Z5111 Encounter for antineoplastic chemotherapy: Secondary | ICD-10-CM | POA: Diagnosis not present

## 2021-02-10 DIAGNOSIS — Z5112 Encounter for antineoplastic immunotherapy: Secondary | ICD-10-CM | POA: Diagnosis not present

## 2021-02-10 DIAGNOSIS — C771 Secondary and unspecified malignant neoplasm of intrathoracic lymph nodes: Secondary | ICD-10-CM | POA: Diagnosis not present

## 2021-02-10 DIAGNOSIS — C7971 Secondary malignant neoplasm of right adrenal gland: Secondary | ICD-10-CM | POA: Diagnosis not present

## 2021-02-10 DIAGNOSIS — C3490 Malignant neoplasm of unspecified part of unspecified bronchus or lung: Secondary | ICD-10-CM

## 2021-02-10 DIAGNOSIS — C3491 Malignant neoplasm of unspecified part of right bronchus or lung: Secondary | ICD-10-CM

## 2021-02-10 DIAGNOSIS — Z7982 Long term (current) use of aspirin: Secondary | ICD-10-CM | POA: Diagnosis not present

## 2021-02-10 DIAGNOSIS — C3411 Malignant neoplasm of upper lobe, right bronchus or lung: Secondary | ICD-10-CM | POA: Diagnosis not present

## 2021-02-10 DIAGNOSIS — Z79899 Other long term (current) drug therapy: Secondary | ICD-10-CM | POA: Diagnosis not present

## 2021-02-10 LAB — TSH: TSH: 3.362 u[IU]/mL (ref 0.308–3.960)

## 2021-02-10 LAB — CBC WITH DIFFERENTIAL (CANCER CENTER ONLY)
Abs Immature Granulocytes: 0.03 10*3/uL (ref 0.00–0.07)
Basophils Absolute: 0 10*3/uL (ref 0.0–0.1)
Basophils Relative: 0 %
Eosinophils Absolute: 0 10*3/uL (ref 0.0–0.5)
Eosinophils Relative: 1 %
HCT: 34.2 % — ABNORMAL LOW (ref 36.0–46.0)
Hemoglobin: 10.9 g/dL — ABNORMAL LOW (ref 12.0–15.0)
Immature Granulocytes: 1 %
Lymphocytes Relative: 40 %
Lymphs Abs: 2 10*3/uL (ref 0.7–4.0)
MCH: 29.6 pg (ref 26.0–34.0)
MCHC: 31.9 g/dL (ref 30.0–36.0)
MCV: 92.9 fL (ref 80.0–100.0)
Monocytes Absolute: 0.9 10*3/uL (ref 0.1–1.0)
Monocytes Relative: 19 %
Neutro Abs: 1.8 10*3/uL (ref 1.7–7.7)
Neutrophils Relative %: 39 %
Platelet Count: 232 10*3/uL (ref 150–400)
RBC: 3.68 MIL/uL — ABNORMAL LOW (ref 3.87–5.11)
RDW: 21.5 % — ABNORMAL HIGH (ref 11.5–15.5)
WBC Count: 4.8 10*3/uL (ref 4.0–10.5)
nRBC: 0 % (ref 0.0–0.2)

## 2021-02-10 LAB — CMP (CANCER CENTER ONLY)
ALT: 27 U/L (ref 0–44)
AST: 20 U/L (ref 15–41)
Albumin: 3.7 g/dL (ref 3.5–5.0)
Alkaline Phosphatase: 102 U/L (ref 38–126)
Anion gap: 11 (ref 5–15)
BUN: 14 mg/dL (ref 8–23)
CO2: 27 mmol/L (ref 22–32)
Calcium: 9.3 mg/dL (ref 8.9–10.3)
Chloride: 101 mmol/L (ref 98–111)
Creatinine: 0.63 mg/dL (ref 0.44–1.00)
GFR, Estimated: >60 mL/min (ref >60)
Glucose, Bld: 99 mg/dL (ref 70–99)
Potassium: 3.7 mmol/L (ref 3.5–5.1)
Sodium: 139 mmol/L (ref 135–145)
Total Bilirubin: 0.4 mg/dL (ref 0.3–1.2)
Total Protein: 6.5 g/dL (ref 6.5–8.1)

## 2021-02-10 MED ORDER — SODIUM CHLORIDE 0.9 % IV SOLN: Freq: Once | INTRAVENOUS | Status: AC

## 2021-02-10 MED ORDER — ZOLEDRONIC ACID 4 MG/5ML IV CONC
3.5000 mg | Freq: Once | INTRAVENOUS | Status: AC
  Administered 2021-02-10: 3.5 mg via INTRAVENOUS
  Filled 2021-02-10: qty 4.38

## 2021-02-10 MED ORDER — PALONOSETRON HCL INJECTION 0.25 MG/5ML
0.2500 mg | Freq: Once | INTRAVENOUS | Status: AC
  Administered 2021-02-10: 0.25 mg via INTRAVENOUS
  Filled 2021-02-10: qty 5

## 2021-02-10 MED ORDER — SODIUM CHLORIDE 0.9 % IV SOLN
200.0000 mg | Freq: Once | INTRAVENOUS | Status: AC
  Administered 2021-02-10: 200 mg via INTRAVENOUS
  Filled 2021-02-10: qty 8

## 2021-02-10 MED ORDER — CYANOCOBALAMIN 1000 MCG/ML IJ SOLN: 1000.0000 ug | Freq: Once | INTRAMUSCULAR | Status: DC

## 2021-02-10 MED ORDER — SODIUM CHLORIDE 0.9 % IV SOLN
304.5000 mg | Freq: Once | INTRAVENOUS | Status: AC
  Administered 2021-02-10: 300 mg via INTRAVENOUS
  Filled 2021-02-10: qty 30

## 2021-02-10 MED ORDER — SODIUM CHLORIDE 0.9 % IV SOLN
500.0000 mg/m2 | Freq: Once | INTRAVENOUS | Status: AC
  Administered 2021-02-10: 700 mg via INTRAVENOUS
  Filled 2021-02-10: qty 20

## 2021-02-10 MED ORDER — SODIUM CHLORIDE 0.9 % IV SOLN
10.0000 mg | Freq: Once | INTRAVENOUS | Status: AC
  Administered 2021-02-10: 10 mg via INTRAVENOUS
  Filled 2021-02-10: qty 10

## 2021-02-10 MED ORDER — SODIUM CHLORIDE 0.9 % IV SOLN
150.0000 mg | Freq: Once | INTRAVENOUS | Status: AC
  Administered 2021-02-10: 150 mg via INTRAVENOUS
  Filled 2021-02-10: qty 150

## 2021-02-10 NOTE — Progress Notes (Signed)
Nutrition follow-up completed with patient during infusion for stage IV non-small cell lung cancer. Weight documented as 91.9 pounds September 22 improved from 87.25 pounds September 12 but decreased from 100.4 pounds August 2. Patient reports she is pleased with how she is feeling. She has increased energy and is able to do things she wants. She is drinking Premier protein shakes with ice cream.   Appetite improved on Megace. Denies nutrition impact symptoms.  Nutrition diagnosis: Unintended weight loss improved.  Severe malnutrition stable.  Intervention: Continue bowel regimen and and adequate fluid intake. Nausea medication as needed. Continue protein shake made with ice cream for added calories.  Encouraged small frequent meals and snacks. Continue Megace as it appears to be helping appetite.  Monitoring, evaluation, goals: Patient will tolerate adequate calories and protein to minimize weight loss.  Next visit: Thursday, November 3 during infusion.  **Disclaimer: This note was dictated with voice recognition software. Similar sounding words can inadvertently be transcribed and this note may contain transcription errors which may not have been corrected upon publication of note.**

## 2021-02-10 NOTE — Progress Notes (Signed)
[facility] [phone]   Fax:(336) [phone]  OFFICE PROGRESS NOTE  [name], MD [address] [place] [address]  DIAGNOSIS:  Stage IV (T3, N2, M1 C) non-small cell lung cancer with unknown histologic subtype.  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  DETECTED ALTERATION(S) / BIOMARKER(S) % CFDNA OR AMPLIFICATION ASSOCIATED FDA-APPROVED THERAPIES CLINICAL TRIAL AVAILABILITY KRASG12C 8.9%  Sotorasib Yes [id number] 7.2% None  Yes  PRIOR THERAPY: Palliative radiotherapy to the painful bone lesions under the care of Dr. Sondra Come.  CURRENT THERAPY:  Palliative systemic chemotherapy with carboplatin for AUC of 5, Alimta 500 Mg/M2 and Keytruda 200 Mg IV every 3 weeks.  First dose of treatment on December 29, 2020.  Status post 2 cycles.  INTERVAL HISTORY: Kristi Jennings 75 y.o. female returns to the clinic today for follow-up visit accompanied by her husband.  The patient is feeling fine today with no concerning complaints.  She tolerated the second cycle of her treatment very well with no significant adverse effects.  She denied having any chest pain, shortness of breath, cough or hemoptysis.  She denied having any fever or chills.  She has no nausea, vomiting, diarrhea or constipation.  She has no headache or visual changes.  She denied having any weight loss or night sweats.  The patient is here today for evaluation before starting cycle #3 of her treatment.  MEDICAL HISTORY: Past Medical History:  Diagnosis Date   Borderline diabetes mellitus    Bronchogenic cancer of right lung (HCC)    Dyslipidemia    Hypertension    Polymyalgia rheumatica (HCC)     ALLERGIES:  is allergic to shellfish-derived products and augmentin [amoxicillin-pot clavulanate].  MEDICATIONS:   Current Outpatient Medications  Medication Sig Dispense Refill   acetaminophen (TYLENOL) 650 MG CR tablet Take 1,300 mg by mouth 3 (three) times daily as needed for pain.     APIXABAN (ELIQUIS) VTE STARTER PACK (10MG AND 5MG) Take as directed on package: start with two-62m tablets twice daily for 4 days. On day 5, switch to one-562mtablet twice daily. 1 each 0   aspirin EC 81 MG tablet Take 81 mg by mouth 4 (four) times a week. Swallow whole.     atenolol (TENORMIN) 25 MG tablet Take 12.5 mg by mouth at bedtime.     diclofenac Sodium (VOLTAREN) 1 % GEL Apply 4 g topically 4 (four) times daily. (Patient taking differently: Apply 4 g topically 4 (four) times daily as needed (pain).) 150 g 0   escitalopram (LEXAPRO) 20 MG tablet Take 20 mg by mouth at bedtime.     folic acid (FOLVITE) 1 MG tablet Take 1 tablet (1 mg total) by mouth daily. 30 tablet 2   HYDROmorphone (DILAUDID) 2 MG tablet 2-4 mg po q6hr for breakthrough pain 30 tablet 0   mirtazapine (REMERON) 15 MG tablet Take 1 tablet (15 mg total) by mouth at bedtime. 30 tablet 0   morphine (MS CONTIN) 30 MG 12 hr tablet Take 1 tablet (30 mg total) by mouth every 12 (twelve) hours. 60 tablet 0   ondansetron (ZOFRAN-ODT) 4 MG disintegrating tablet Take 4 mg by mouth 2 (two) times daily as needed for nausea or vomiting. (Patient not taking: Reported on 01/31/2021)     prochlorperazine (COMPAZINE) 10  MG tablet Take 1 tablet (10 mg total) by mouth every 6 (six) hours as needed for nausea or vomiting. (Patient not taking: No sig reported) 30 tablet 0   rosuvastatin (CRESTOR) 10 MG tablet Take 10 mg by mouth at bedtime.     No current facility-administered medications for this visit.    SURGICAL HISTORY: No past surgical history on file.  REVIEW OF SYSTEMS:  A comprehensive review of systems was negative.   PHYSICAL EXAMINATION: General appearance: alert, cooperative, and no distress Head: Normocephalic, without obvious abnormality,  atraumatic Neck: no adenopathy, no JVD, supple, symmetrical, trachea midline, and thyroid not enlarged, symmetric, no tenderness/mass/nodules Lymph nodes: Cervical, supraclavicular, and axillary nodes normal. Resp: clear to auscultation bilaterally Back: symmetric, no curvature. ROM normal. No CVA tenderness. Cardio: regular rate and rhythm, S1, S2 normal, no murmur, click, rub or gallop GI: soft, non-tender; bowel sounds normal; no masses,  no organomegaly Extremities: extremities normal, atraumatic, no cyanosis or edema  ECOG PERFORMANCE STATUS: 1 - Symptomatic but completely ambulatory  Blood pressure (!) 153/73, pulse 63, temperature (!) 97.5 F (36.4 C), temperature source Oral, resp. rate 17, height _0  (1.651 m), weight 91 lb 14.4 oz (41.7 kg), SpO2 98 %.  LABORATORY DATA: Lab Results  Component Value Date   WBC 4.8 02/10/2021   HGB 10.9 (L) 02/10/2021   HCT 34.2 (L) 02/10/2021   MCV 92.9 02/10/2021   PLT 232 02/10/2021      Chemistry      Component Value Date/Time   NA 137 02/02/2021 1323   K 3.8 02/02/2021 1323   CL 99 02/02/2021 1323   CO2 28 02/02/2021 1323   BUN 10 02/02/2021 1323   CREATININE 0.57 02/02/2021 1323      Component Value Date/Time   CALCIUM 9.0 02/02/2021 1323   ALKPHOS 131 (H) 02/02/2021 1323   AST 25 02/02/2021 1323   ALT 28 02/02/2021 1323   BILITOT 0.7 02/02/2021 1323       RADIOGRAPHIC STUDIES: No results found.  ASSESSMENT AND PLAN: This is a very pleasant 75 years old white female recently diagnosed with a stage IV (T3, N2, M1c) non-small cell lung cancer with unknown histologic subtype questionable to be likely squamous cell carcinoma based on the morphology of the right upper lobe cavitary mass with mediastinal lymphadenopathy and metastatic disease to the bones as well as the right adrenal gland diagnosed in June 2022 but adenocarcinoma cannot be completely ruled out especially with the KRAS G12C mutation.  The patient has  actionable KRAS G12C mutation which will be used as an option for treatment on the second line setting. The patient underwent palliative radiotherapy to multiple metastatic bone lesions under the care of Dr. Sondra Come.  The patient is currently undergoing systemic chemotherapy with carboplatin for AUC of 5, Alimta 500 Mg/M2 and Keytruda 200 Mg IV every 3 weeks status post 2 cycles.  She tolerated the second cycle of her treatment fairly well. I recommended for her to proceed with cycle #3 today as planned. I will see her back for follow-up visit in 3 weeks for evaluation with repeat CT scan of the chest, abdomen pelvis for restaging of her disease. For pain management she will continue her current treatment with MS Contin 30 mg p.o. every 12 hours in addition to [place] for breakthrough pain as well as Tylenol and Voltaren gel. For the lack of appetite and weight loss, she is currently on Megace ES 625 mg p.o. daily.  The patient was  advised about the risk of Deep venous thrombosis with this treatment. For the depression, she will call her gynecologist to increase the dose of Lexapro that was prescribed by her. The patient was advised to call immediately if she has any other concerning symptoms in the interval.  The patient voices understanding of current disease status and treatment options and is in agreement with the current care plan.  All questions were answered. The patient knows to call the clinic with any problems, questions or concerns. We can certainly see the patient much sooner if necessary.  Disclaimer: This note was dictated with voice recognition software. Similar sounding words can inadvertently be transcribed and may not be corrected upon review.

## 2021-02-10 NOTE — Patient Instructions (Signed)
[facility]   Discharge Instructions: Thank you for choosing [facility] to provide your oncology and hematology care.   If you have a lab appointment with the [facility], please go directly to the [facility] and check in at the registration area.   Wear comfortable clothing and clothing appropriate for easy access to any Portacath or PICC line.   We strive to give you quality time with your provider. You may need to reschedule your appointment if you arrive late (15 or more minutes).  Arriving late affects you and other patients whose appointments are after yours.  Also, if you miss three or more appointments without notifying the office, you may be dismissed from the clinic at the provider's discretion.      For prescription refill requests, have your pharmacy contact our office and allow 72 hours for refills to be completed.    Today you received the following chemotherapy and/or immunotherapy agents: Carboplatin (Paraplatin), Pemetrexed (Alimta), Pembrolizumab (Keytruda)       To help prevent nausea and vomiting after your treatment, we encourage you to take your nausea medication as directed.  BELOW ARE SYMPTOMS THAT SHOULD BE REPORTED IMMEDIATELY: *FEVER GREATER THAN 100.4 F (38 C) OR HIGHER *CHILLS OR SWEATING *NAUSEA AND VOMITING THAT IS NOT CONTROLLED WITH YOUR NAUSEA MEDICATION *UNUSUAL SHORTNESS OF BREATH *UNUSUAL BRUISING OR BLEEDING *URINARY PROBLEMS (pain or burning when urinating, or frequent urination) *BOWEL PROBLEMS (unusual diarrhea, constipation, pain near the anus) TENDERNESS IN MOUTH AND THROAT WITH OR WITHOUT PRESENCE OF ULCERS (sore throat, sores in mouth, or a toothache) UNUSUAL RASH, SWELLING OR PAIN  UNUSUAL VAGINAL DISCHARGE OR ITCHING   Items with * indicate a potential emergency and should be followed up as soon as possible or go to the Emergency Department if any problems should occur.  Please show the  CHEMOTHERAPY ALERT CARD or IMMUNOTHERAPY ALERT CARD at check-in to the Emergency Department and triage nurse.  Should you have questions after your visit or need to cancel or reschedule your appointment, please contact [facility]  Dept: [phone]  and follow the prompts.  Office hours are 8:00 a.m. to 4:30 p.m. Monday - Friday. Please note that voicemails left after 4:00 p.m. may not be returned until the following business day.  We are closed weekends and major holidays. You have access to a nurse at all times for urgent questions. Please call the main number to the clinic Dept: [phone] and follow the prompts.   For any non-urgent questions, you may also contact your provider using MyChart. We now offer e-Visits for anyone 57 and older to request care online for non-urgent symptoms. For details visit [email or web address].   Also download the MyChart app! Go to the app store, search "MyChart", open the app, select [facility] [facility], and log in with your MyChart username and password.  Due to Covid, a mask is required upon entering the hospital/clinic. If you do not have a mask, one will be given to you upon arrival. For doctor visits, patients may have 1 support person aged 72 or older with them. For treatment visits, patients cannot have anyone with them due to current Covid guidelines and our immunocompromised population.

## 2021-02-12 ENCOUNTER — Ambulatory Visit: Payer: Medicare HMO

## 2021-02-12 DIAGNOSIS — C3491 Malignant neoplasm of unspecified part of right bronchus or lung: Secondary | ICD-10-CM | POA: Diagnosis not present

## 2021-02-14 ENCOUNTER — Encounter: Payer: Self-pay | Admitting: General Practice

## 2021-02-14 NOTE — Progress Notes (Signed)
[facility] Spiritual Care Note  Missed Kristi Jennings at treatment last week, so left voicemail of follow-up support. Encouraged return call if helpful to her and plan to follow up in person at next treatment.   [place], [place], [facility] Pager [phone] Voicemail [phone]

## 2021-02-15 DIAGNOSIS — M25511 Pain in right shoulder: Secondary | ICD-10-CM | POA: Diagnosis not present

## 2021-02-15 DIAGNOSIS — I10 Essential (primary) hypertension: Secondary | ICD-10-CM | POA: Diagnosis not present

## 2021-02-15 DIAGNOSIS — E119 Type 2 diabetes mellitus without complications: Secondary | ICD-10-CM | POA: Diagnosis not present

## 2021-02-15 DIAGNOSIS — C3491 Malignant neoplasm of unspecified part of right bronchus or lung: Secondary | ICD-10-CM | POA: Diagnosis not present

## 2021-02-15 DIAGNOSIS — I2699 Other pulmonary embolism without acute cor pulmonale: Secondary | ICD-10-CM | POA: Diagnosis not present

## 2021-02-15 DIAGNOSIS — J309 Allergic rhinitis, unspecified: Secondary | ICD-10-CM | POA: Diagnosis not present

## 2021-02-15 DIAGNOSIS — M5416 Radiculopathy, lumbar region: Secondary | ICD-10-CM | POA: Diagnosis not present

## 2021-02-15 DIAGNOSIS — M8448XD Pathological fracture, other site, subsequent encounter for fracture with routine healing: Secondary | ICD-10-CM | POA: Diagnosis not present

## 2021-02-15 DIAGNOSIS — E785 Hyperlipidemia, unspecified: Secondary | ICD-10-CM | POA: Diagnosis not present

## 2021-02-15 DIAGNOSIS — M353 Polymyalgia rheumatica: Secondary | ICD-10-CM | POA: Diagnosis not present

## 2021-02-16 ENCOUNTER — Inpatient Hospital Stay: Payer: Medicare HMO

## 2021-02-16 ENCOUNTER — Other Ambulatory Visit: Payer: Self-pay

## 2021-02-16 DIAGNOSIS — Z5111 Encounter for antineoplastic chemotherapy: Secondary | ICD-10-CM | POA: Diagnosis not present

## 2021-02-16 DIAGNOSIS — Z7982 Long term (current) use of aspirin: Secondary | ICD-10-CM | POA: Diagnosis not present

## 2021-02-16 DIAGNOSIS — Z5112 Encounter for antineoplastic immunotherapy: Secondary | ICD-10-CM | POA: Diagnosis not present

## 2021-02-16 DIAGNOSIS — I10 Essential (primary) hypertension: Secondary | ICD-10-CM | POA: Diagnosis not present

## 2021-02-16 DIAGNOSIS — C7951 Secondary malignant neoplasm of bone: Secondary | ICD-10-CM | POA: Diagnosis not present

## 2021-02-16 DIAGNOSIS — C7971 Secondary malignant neoplasm of right adrenal gland: Secondary | ICD-10-CM | POA: Diagnosis not present

## 2021-02-16 DIAGNOSIS — C3411 Malignant neoplasm of upper lobe, right bronchus or lung: Secondary | ICD-10-CM | POA: Diagnosis not present

## 2021-02-16 DIAGNOSIS — Z79899 Other long term (current) drug therapy: Secondary | ICD-10-CM | POA: Diagnosis not present

## 2021-02-16 DIAGNOSIS — C3491 Malignant neoplasm of unspecified part of right bronchus or lung: Secondary | ICD-10-CM

## 2021-02-16 DIAGNOSIS — C771 Secondary and unspecified malignant neoplasm of intrathoracic lymph nodes: Secondary | ICD-10-CM | POA: Diagnosis not present

## 2021-02-16 LAB — CMP (CANCER CENTER ONLY)
ALT: 23 U/L (ref 0–44)
AST: 24 U/L (ref 15–41)
Albumin: 3.9 g/dL (ref 3.5–5.0)
Alkaline Phosphatase: 95 U/L (ref 38–126)
Anion gap: 11 (ref 5–15)
BUN: 16 mg/dL (ref 8–23)
CO2: 26 mmol/L (ref 22–32)
Calcium: 9.3 mg/dL (ref 8.9–10.3)
Chloride: 97 mmol/L — ABNORMAL LOW (ref 98–111)
Creatinine: 0.63 mg/dL (ref 0.44–1.00)
GFR, Estimated: >60 mL/min (ref >60)
Glucose, Bld: 100 mg/dL — ABNORMAL HIGH (ref 70–99)
Potassium: 4 mmol/L (ref 3.5–5.1)
Sodium: 134 mmol/L — ABNORMAL LOW (ref 135–145)
Total Bilirubin: 1 mg/dL (ref 0.3–1.2)
Total Protein: 7.3 g/dL (ref 6.5–8.1)

## 2021-02-16 LAB — CBC WITH DIFFERENTIAL (CANCER CENTER ONLY)
Abs Immature Granulocytes: 0.02 10*3/uL (ref 0.00–0.07)
Basophils Absolute: 0 10*3/uL (ref 0.0–0.1)
Basophils Relative: 0 %
Eosinophils Absolute: 0 10*3/uL (ref 0.0–0.5)
Eosinophils Relative: 0 %
HCT: 39 % (ref 36.0–46.0)
Hemoglobin: 12.7 g/dL (ref 12.0–15.0)
Immature Granulocytes: 1 %
Lymphocytes Relative: 35 %
Lymphs Abs: 1.1 10*3/uL (ref 0.7–4.0)
MCH: 30.1 pg (ref 26.0–34.0)
MCHC: 32.6 g/dL (ref 30.0–36.0)
MCV: 92.4 fL (ref 80.0–100.0)
Monocytes Absolute: 0.3 10*3/uL (ref 0.1–1.0)
Monocytes Relative: 11 %
Neutro Abs: 1.6 10*3/uL — ABNORMAL LOW (ref 1.7–7.7)
Neutrophils Relative %: 53 %
Platelet Count: 151 10*3/uL (ref 150–400)
RBC: 4.22 MIL/uL (ref 3.87–5.11)
RDW: 20.3 % — ABNORMAL HIGH (ref 11.5–15.5)
WBC Count: 3.1 10*3/uL — ABNORMAL LOW (ref 4.0–10.5)
nRBC: 0 % (ref 0.0–0.2)

## 2021-02-16 LAB — TSH: TSH: 3.418 u[IU]/mL (ref 0.308–3.960)

## 2021-02-17 DIAGNOSIS — I10 Essential (primary) hypertension: Secondary | ICD-10-CM | POA: Diagnosis not present

## 2021-02-17 DIAGNOSIS — N390 Urinary tract infection, site not specified: Secondary | ICD-10-CM | POA: Diagnosis not present

## 2021-02-22 DIAGNOSIS — M8448XD Pathological fracture, other site, subsequent encounter for fracture with routine healing: Secondary | ICD-10-CM | POA: Diagnosis not present

## 2021-02-22 DIAGNOSIS — I2699 Other pulmonary embolism without acute cor pulmonale: Secondary | ICD-10-CM | POA: Diagnosis not present

## 2021-02-22 DIAGNOSIS — J309 Allergic rhinitis, unspecified: Secondary | ICD-10-CM | POA: Diagnosis not present

## 2021-02-22 DIAGNOSIS — M25511 Pain in right shoulder: Secondary | ICD-10-CM | POA: Diagnosis not present

## 2021-02-22 DIAGNOSIS — I10 Essential (primary) hypertension: Secondary | ICD-10-CM | POA: Diagnosis not present

## 2021-02-22 DIAGNOSIS — E785 Hyperlipidemia, unspecified: Secondary | ICD-10-CM | POA: Diagnosis not present

## 2021-02-22 DIAGNOSIS — C3491 Malignant neoplasm of unspecified part of right bronchus or lung: Secondary | ICD-10-CM | POA: Diagnosis not present

## 2021-02-22 DIAGNOSIS — M5416 Radiculopathy, lumbar region: Secondary | ICD-10-CM | POA: Diagnosis not present

## 2021-02-22 DIAGNOSIS — M353 Polymyalgia rheumatica: Secondary | ICD-10-CM | POA: Diagnosis not present

## 2021-02-22 DIAGNOSIS — E119 Type 2 diabetes mellitus without complications: Secondary | ICD-10-CM | POA: Diagnosis not present

## 2021-02-23 ENCOUNTER — Other Ambulatory Visit: Payer: Self-pay

## 2021-02-23 ENCOUNTER — Inpatient Hospital Stay: Payer: Medicare HMO | Attending: Internal Medicine

## 2021-02-23 DIAGNOSIS — Z7982 Long term (current) use of aspirin: Secondary | ICD-10-CM | POA: Diagnosis not present

## 2021-02-23 DIAGNOSIS — Z7901 Long term (current) use of anticoagulants: Secondary | ICD-10-CM | POA: Diagnosis not present

## 2021-02-23 DIAGNOSIS — C7951 Secondary malignant neoplasm of bone: Secondary | ICD-10-CM | POA: Insufficient documentation

## 2021-02-23 DIAGNOSIS — C7971 Secondary malignant neoplasm of right adrenal gland: Secondary | ICD-10-CM | POA: Diagnosis not present

## 2021-02-23 DIAGNOSIS — Z5112 Encounter for antineoplastic immunotherapy: Secondary | ICD-10-CM | POA: Diagnosis not present

## 2021-02-23 DIAGNOSIS — Z86711 Personal history of pulmonary embolism: Secondary | ICD-10-CM | POA: Insufficient documentation

## 2021-02-23 DIAGNOSIS — I10 Essential (primary) hypertension: Secondary | ICD-10-CM | POA: Insufficient documentation

## 2021-02-23 DIAGNOSIS — Z23 Encounter for immunization: Secondary | ICD-10-CM | POA: Insufficient documentation

## 2021-02-23 DIAGNOSIS — Z79899 Other long term (current) drug therapy: Secondary | ICD-10-CM | POA: Diagnosis not present

## 2021-02-23 DIAGNOSIS — C3491 Malignant neoplasm of unspecified part of right bronchus or lung: Secondary | ICD-10-CM

## 2021-02-23 DIAGNOSIS — C3411 Malignant neoplasm of upper lobe, right bronchus or lung: Secondary | ICD-10-CM | POA: Diagnosis not present

## 2021-02-23 DIAGNOSIS — Z5111 Encounter for antineoplastic chemotherapy: Secondary | ICD-10-CM | POA: Insufficient documentation

## 2021-02-23 LAB — CBC WITH DIFFERENTIAL (CANCER CENTER ONLY)
Abs Immature Granulocytes: 0.01 10*3/uL (ref 0.00–0.07)
Basophils Absolute: 0 10*3/uL (ref 0.0–0.1)
Basophils Relative: 0 %
Eosinophils Absolute: 0 10*3/uL (ref 0.0–0.5)
Eosinophils Relative: 1 %
HCT: 31.3 % — ABNORMAL LOW (ref 36.0–46.0)
Hemoglobin: 10.6 g/dL — ABNORMAL LOW (ref 12.0–15.0)
Immature Granulocytes: 0 %
Lymphocytes Relative: 25 %
Lymphs Abs: 1 10*3/uL (ref 0.7–4.0)
MCH: 31 pg (ref 26.0–34.0)
MCHC: 33.9 g/dL (ref 30.0–36.0)
MCV: 91.5 fL (ref 80.0–100.0)
Monocytes Absolute: 0.6 10*3/uL (ref 0.1–1.0)
Monocytes Relative: 16 %
Neutro Abs: 2.3 10*3/uL (ref 1.7–7.7)
Neutrophils Relative %: 58 %
Platelet Count: 113 10*3/uL — ABNORMAL LOW (ref 150–400)
RBC: 3.42 MIL/uL — ABNORMAL LOW (ref 3.87–5.11)
RDW: 20.6 % — ABNORMAL HIGH (ref 11.5–15.5)
WBC Count: 3.9 10*3/uL — ABNORMAL LOW (ref 4.0–10.5)
nRBC: 0 % (ref 0.0–0.2)

## 2021-02-23 LAB — CMP (CANCER CENTER ONLY)
ALT: 16 U/L (ref 0–44)
AST: 21 U/L (ref 15–41)
Albumin: 3.6 g/dL (ref 3.5–5.0)
Alkaline Phosphatase: 89 U/L (ref 38–126)
Anion gap: 10 (ref 5–15)
BUN: 10 mg/dL (ref 8–23)
CO2: 28 mmol/L (ref 22–32)
Calcium: 9.1 mg/dL (ref 8.9–10.3)
Chloride: 100 mmol/L (ref 98–111)
Creatinine: 0.65 mg/dL (ref 0.44–1.00)
GFR, Estimated: >60 mL/min (ref >60)
Glucose, Bld: 124 mg/dL — ABNORMAL HIGH (ref 70–99)
Potassium: 4.1 mmol/L (ref 3.5–5.1)
Sodium: 138 mmol/L (ref 135–145)
Total Bilirubin: 0.7 mg/dL (ref 0.3–1.2)
Total Protein: 6.7 g/dL (ref 6.5–8.1)

## 2021-02-25 DIAGNOSIS — M25511 Pain in right shoulder: Secondary | ICD-10-CM | POA: Diagnosis not present

## 2021-02-25 NOTE — Progress Notes (Signed)
[facility] OFFICE PROGRESS NOTE  [name], MD [address] [place] [address]  DIAGNOSIS: Stage IV (T3, N2, M1 C) non-small cell lung cancer with unknown histologic subtype.  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   DETECTED ALTERATION(S) / BIOMARKER(S)      % CFDNA OR AMPLIFICATION        ASSOCIATED FDA-APPROVED THERAPIES         CLINICAL TRIAL AVAILABILITY [id number] 8.9%   Sotorasib Yes [id number] 7.2% None     Yes  PRIOR THERAPY: Palliative radiotherapy to the painful bone lesions under the care of Dr. Sondra Come.  CURRENT THERAPY:  1) Palliative systemic chemotherapy with carboplatin for AUC of 5, Alimta 500 Mg/M2 and Keytruda 200 Mg IV every 3 weeks.  First dose of treatment on December 29, 2020. Status post 3 cycles.  2)Zometa every 6 weeks starting on 02/10/21  INTERVAL HISTORY: Kristi Jennings 75 y.o. female returns to the clinic today for a follow-up visit accompanied by her husband.  The patient is feeling fairly well today without any concerning complaints except for she is wondering if she can get her 4th COVID booster and flu vaccine.  She is currently undergoing chemotherapy and immunotherapy and she is tolerating her treatment fairly well.  She denies any recent fever, chills, or night sweats.  She has previously been seen by member the nutritionist team for weight loss.  She was prescribed Megace but prior to starting to take this, the patient developed a small pulmonary embolism and this was discontinued.  She is currently taking Remeron for appetite.  Her weight is up about 2 lbs.  She denies any chest pain, shortness of breath, cough, or hemoptysis. She may get slightly winded if doing strenuous household activities but she notes that she hardly notices it.  For cancer-related pain  from the metastatic bone lesions, the patient has MS contin 30 mg which she is taking BID. She is not taking anything for breakthrough pain. She notes she is not in any pain. If needed for her low back pain, she may take tylenol which is effective. Her husband is wondering if she needs to continue with pain medication if she is not in any pain as it affects her appetite and constipation. She uses senna for constipation. She receives Zometa for her metastatic disease to the bone.  She denies any nausea, vomiting, or diarrhea. She denies any headache or visual changes.  She denies any rashes or skin changes. She notes some mild right ankle swelling without skin changes or pain. The swelling does improve with elevation. Denies traumas or injuries. She is compliant with her blood thinner and has not skipped any doses. The patient recently had a restaging CT scan performed.  She is here today for evaluation to review her scan results before starting cycle #4.    MEDICAL HISTORY: Past Medical History:  Diagnosis Date   Borderline diabetes mellitus    Bronchogenic cancer of right lung (HCC)    Dyslipidemia    Hypertension    Polymyalgia rheumatica (HCC)     ALLERGIES:  is allergic to shellfish-derived products and augmentin [amoxicillin-pot clavulanate].  MEDICATIONS:  Current Outpatient Medications  Medication Sig Dispense Refill   acetaminophen (TYLENOL) 650 MG CR tablet Take 1,300 mg by mouth 3 (three) times daily  as needed for pain.     aspirin EC 81 MG tablet Take 81 mg by mouth 4 (four) times a week. Swallow whole.     atenolol (TENORMIN) 25 MG tablet Take 12.5 mg by mouth at bedtime.     diclofenac Sodium (VOLTAREN) 1 % GEL Apply 4 g topically 4 (four) times daily. (Patient taking differently: Apply 4 g topically 4 (four) times daily as needed (pain).) 150 g 0   ELIQUIS 5 MG TABS tablet Take 5 mg by mouth 2 (two) times daily.     escitalopram (LEXAPRO) 20 MG tablet Take 20 mg by mouth at  bedtime.     folic acid (FOLVITE) 1 MG tablet Take 1 tablet (1 mg total) by mouth daily. 30 tablet 2   mirtazapine (REMERON) 15 MG tablet Take by mouth.     prochlorperazine (COMPAZINE) 10 MG tablet Take 1 tablet (10 mg total) by mouth every 6 (six) hours as needed for nausea or vomiting. 30 tablet 0   rosuvastatin (CRESTOR) 10 MG tablet Take 10 mg by mouth at bedtime.     HYDROmorphone (DILAUDID) 2 MG tablet 2-4 mg po q6hr for breakthrough pain (Patient not taking: Reported on 03/03/2021) 30 tablet 0   mirtazapine (REMERON) 15 MG tablet Take 1 tablet (15 mg total) by mouth at bedtime. 30 tablet 0   morphine (MS CONTIN) 15 MG 12 hr tablet Take 1 tablet (15 mg total) by mouth every 12 (twelve) hours. 30 tablet 0   ondansetron (ZOFRAN-ODT) 4 MG disintegrating tablet Take 4 mg by mouth 2 (two) times daily as needed for nausea or vomiting. (Patient not taking: No sig reported)     No current facility-administered medications for this visit.    SURGICAL HISTORY: No past surgical history on file.  REVIEW OF SYSTEMS:   Review of Systems  Constitutional: Negative for appetite change, chills, fatigue, fever and unexpected weight change.  HENT: Negative for mouth sores, nosebleeds, sore throat and trouble swallowing.   Eyes: Negative for eye problems and icterus.  Respiratory: Negative for cough, hemoptysis, shortness of breath and wheezing.   Cardiovascular: Negative for chest pain.  Positive for mild right ankle swelling.  Gastrointestinal: Positive for mild constipation.  Negative for abdominal pain, diarrhea, nausea and vomiting.  Genitourinary: Negative for bladder incontinence, difficulty urinating, dysuria, frequency and hematuria.   Musculoskeletal: Positive for mild low back pain.  Negative for gait problem, neck pain and neck stiffness.  Skin: Negative for itching and rash.  Neurological: Negative for dizziness, extremity weakness, gait problem, headaches, light-headedness and seizures.   Hematological: Negative for adenopathy. Does not bruise/bleed easily.  Psychiatric/Behavioral: Negative for confusion, depression and sleep disturbance. The patient is not nervous/anxious.     PHYSICAL EXAMINATION:  Blood pressure (!) 149/72, pulse 66, temperature 98.8 F (37.1 C), temperature source Oral, resp. rate 17, weight 93 lb 12.8 oz (42.5 kg), SpO2 95 %.  ECOG PERFORMANCE STATUS: 1  Physical Exam  Constitutional: Oriented to person, place, and time and well-developed, well-nourished, and in no distress.  HENT:  Head: Normocephalic and atraumatic.  Mouth/Throat: Oropharynx is clear and moist. No oropharyngeal exudate.  Eyes: Conjunctivae are normal. Right eye exhibits no discharge. Left eye exhibits no discharge. No scleral icterus.  Neck: Normal range of motion. Neck supple.  Cardiovascular: Normal rate, regular rhythm, normal heart sounds and intact distal pulses.   Pulmonary/Chest: Effort normal and breath sounds normal. No respiratory distress. No wheezes. No rales.  Abdominal: Soft. Bowel sounds are normal.  Exhibits no distension and no mass. There is no tenderness.  Musculoskeletal: Normal range of motion. Exhibits no edema.  Lymphadenopathy:    No cervical adenopathy.  Neurological: Alert and oriented to person, place, and time. Exhibits normal muscle tone. Gait normal. Coordination normal.  Skin: Skin is warm and dry. No rash noted. Not diaphoretic. No erythema. No pallor.  Psychiatric: Mood, memory and judgment normal.  Vitals reviewed.  LABORATORY DATA: Lab Results  Component Value Date   WBC 4.5 03/03/2021   HGB 10.0 (L) 03/03/2021   HCT 30.5 (L) 03/03/2021   MCV 94.7 03/03/2021   PLT 296 03/03/2021      Chemistry      Component Value Date/Time   NA 136 03/03/2021 0925   K 3.4 (L) 03/03/2021 0925   CL 100 03/03/2021 0925   CO2 29 03/03/2021 0925   BUN 11 03/03/2021 0925   CREATININE 0.49 03/03/2021 0925      Component Value Date/Time   CALCIUM 8.8  (L) 03/03/2021 0925   ALKPHOS 69 03/03/2021 0925   AST 29 03/03/2021 0925   ALT 25 03/03/2021 0925   BILITOT 0.4 03/03/2021 0925       RADIOGRAPHIC STUDIES:  CT Chest W Contrast  Result Date: 03/02/2021 CLINICAL DATA:  75 year old female with history of non-small cell lung cancer status post radiation therapy and chemotherapy 2 weeks ago. EXAM: CT CHEST, ABDOMEN, AND PELVIS WITH CONTRAST TECHNIQUE: Multidetector CT imaging of the chest, abdomen and pelvis was performed following the standard protocol during bolus administration of intravenous contrast. CONTRAST:  65mL OMNIPAQUE IOHEXOL 350 MG/ML SOLN COMPARISON:  Chest CTA 01/01/2021. Outside CT of the chest, abdomen and pelvis 10/20/2020. FINDINGS: CT CHEST FINDINGS Cardiovascular: Heart size is normal. There is no significant pericardial fluid, thickening or pericardial calcification. There is aortic atherosclerosis, as well as atherosclerosis of the great vessels of the mediastinum and the coronary arteries, including calcified atherosclerotic plaque in the left main, left anterior descending, left circumflex and right coronary arteries. Ectasia of ascending thoracic aorta (4.2 cm in diameter). Mediastinum/Nodes: Previously noted enlarged right paratracheal lymph nodes have regressed and are no longer enlarged. No definite pathologically enlarged mediastinal or hilar lymph nodes are noted on today's examination. Esophagus is unremarkable in appearance. No axillary lymphadenopathy. Lungs/Pleura: Partial involution of partially cavitary right upper lobe mass which currently measures 5.4 x 3.5 x 6.0 cm (axial image 13 of series 2 and coronal image 53 of series 5), with persistent cavitation along the inferolateral margin of the lesion. Some mild postobstructive changes are noted in the lung parenchyma surrounding this lesion. No other definite suspicious appearing pulmonary nodules or masses are noted. No new acute consolidative airspace disease. No  pleural effusions. Musculoskeletal: Expansile lytic lesion in the anterolateral aspect of the right third rib measuring up to 2.1 x 5.5 cm, far more apparent than the prior examination. 1.7 cm lytic lesion in the left side of the upper sternum (axial image 26 of series 2) with associated pathologic fracture. 1.9 cm centrally lucent peripherally sclerotic lesion in the left humeral head (axial image 12 of series 2). New healing fracture of the posterolateral aspect of the left fourth rib (axial image 15 of series 2). Neck pathologic compression fractures of T8 and T12 both appear slightly more compressed than the prior study, with up to 70% loss of height anteriorly at T12 and 60% loss of height anteriorly at T8. Mild compression fracture of superior endplate of T4 with 35% loss of anterior vertebral body  height, similar to the prior study. CT ABDOMEN PELVIS FINDINGS Hepatobiliary: Previously noted hypovascular lesion in segment 7 of the liver is decreased in size compared to the prior study from 01/01/2021, currently measuring 2.3 x 2.0 cm. There is a surrounding perfusion anomaly cephalad to this lesion in the right lobe of the liver. No other definite suspicious appearing hepatic lesions are noted. No intra or extrahepatic biliary ductal dilatation. Gallbladder is normal in appearance. Pancreas: No pancreatic mass. No pancreatic ductal dilatation. No pancreatic or peripancreatic fluid collections or inflammatory changes. Spleen: Unremarkable. Adrenals/Urinary Tract: Bilateral kidneys and left adrenal gland are normal in appearance. Right adrenal mass is smaller than the prior study, currently measuring approximately 2.5 x 1.7 cm. No hydroureteronephrosis. Urinary bladder is normal in appearance. Stomach/Bowel: Normal appearance of the stomach. No pathologic dilatation of small bowel or colon. Normal appendix. Vascular/Lymphatic: Aortic atherosclerosis, without evidence of aneurysm or dissection in the abdominal or  pelvic vasculature. No definite lymphadenopathy noted in the abdomen or pelvis. Reproductive: Uterus and ovaries are atrophic. Other: No significant volume of ascites.  No pneumoperitoneum. Musculoskeletal: Widespread areas of lucency are noted in the proximal femurs bilaterally, suspicious for osseous metastases. IMPRESSION: 1. Today's study demonstrates some regression of the large partially cavitary right upper lobe lung mass, as well as regression of previously noted mediastinal lymphadenopathy and regression of the metastatic lesions in the liver and right adrenal gland. Widespread metastatic disease to the bones is also noted on today's examination with multiple pathologic fractures, as detailed above. 2. Aortic atherosclerosis, in addition to left main and 3 vessel coronary artery disease. 3. Ectasia of ascending thoracic aorta (4.2 cm in diameter). Recommend annual imaging followup by CTA or MRA. This recommendation follows 2010 ACCF/AHA/AATS/ACR/ASA/SCA/SCAI/SIR/STS/SVM Guidelines for the Diagnosis and Management of Patients with Thoracic Aortic Disease. Circulation. 2010; 121: Q222-L798. Aortic aneurysm NOS (ICD10-I71.9). 4. Additional incidental findings, as above. Electronically Signed   By: Vinnie Langton M.D.   On: 03/02/2021 20:57   CT Abdomen Pelvis W Contrast  Result Date: 03/02/2021 CLINICAL DATA:  75 year old female with history of non-small cell lung cancer status post radiation therapy and chemotherapy 2 weeks ago. EXAM: CT CHEST, ABDOMEN, AND PELVIS WITH CONTRAST TECHNIQUE: Multidetector CT imaging of the chest, abdomen and pelvis was performed following the standard protocol during bolus administration of intravenous contrast. CONTRAST:  101mL OMNIPAQUE IOHEXOL 350 MG/ML SOLN COMPARISON:  Chest CTA 01/01/2021. Outside CT of the chest, abdomen and pelvis 10/20/2020. FINDINGS: CT CHEST FINDINGS Cardiovascular: Heart size is normal. There is no significant pericardial fluid, thickening or  pericardial calcification. There is aortic atherosclerosis, as well as atherosclerosis of the great vessels of the mediastinum and the coronary arteries, including calcified atherosclerotic plaque in the left main, left anterior descending, left circumflex and right coronary arteries. Ectasia of ascending thoracic aorta (4.2 cm in diameter). Mediastinum/Nodes: Previously noted enlarged right paratracheal lymph nodes have regressed and are no longer enlarged. No definite pathologically enlarged mediastinal or hilar lymph nodes are noted on today's examination. Esophagus is unremarkable in appearance. No axillary lymphadenopathy. Lungs/Pleura: Partial involution of partially cavitary right upper lobe mass which currently measures 5.4 x 3.5 x 6.0 cm (axial image 13 of series 2 and coronal image 53 of series 5), with persistent cavitation along the inferolateral margin of the lesion. Some mild postobstructive changes are noted in the lung parenchyma surrounding this lesion. No other definite suspicious appearing pulmonary nodules or masses are noted. No new acute consolidative airspace disease. No pleural effusions. Musculoskeletal:  Expansile lytic lesion in the anterolateral aspect of the right third rib measuring up to 2.1 x 5.5 cm, far more apparent than the prior examination. 1.7 cm lytic lesion in the left side of the upper sternum (axial image 26 of series 2) with associated pathologic fracture. 1.9 cm centrally lucent peripherally sclerotic lesion in the left humeral head (axial image 12 of series 2). New healing fracture of the posterolateral aspect of the left fourth rib (axial image 15 of series 2). Neck pathologic compression fractures of T8 and T12 both appear slightly more compressed than the prior study, with up to 70% loss of height anteriorly at T12 and 60% loss of height anteriorly at T8. Mild compression fracture of superior endplate of T4 with 78% loss of anterior vertebral body height, similar to the  prior study. CT ABDOMEN PELVIS FINDINGS Hepatobiliary: Previously noted hypovascular lesion in segment 7 of the liver is decreased in size compared to the prior study from 01/01/2021, currently measuring 2.3 x 2.0 cm. There is a surrounding perfusion anomaly cephalad to this lesion in the right lobe of the liver. No other definite suspicious appearing hepatic lesions are noted. No intra or extrahepatic biliary ductal dilatation. Gallbladder is normal in appearance. Pancreas: No pancreatic mass. No pancreatic ductal dilatation. No pancreatic or peripancreatic fluid collections or inflammatory changes. Spleen: Unremarkable. Adrenals/Urinary Tract: Bilateral kidneys and left adrenal gland are normal in appearance. Right adrenal mass is smaller than the prior study, currently measuring approximately 2.5 x 1.7 cm. No hydroureteronephrosis. Urinary bladder is normal in appearance. Stomach/Bowel: Normal appearance of the stomach. No pathologic dilatation of small bowel or colon. Normal appendix. Vascular/Lymphatic: Aortic atherosclerosis, without evidence of aneurysm or dissection in the abdominal or pelvic vasculature. No definite lymphadenopathy noted in the abdomen or pelvis. Reproductive: Uterus and ovaries are atrophic. Other: No significant volume of ascites.  No pneumoperitoneum. Musculoskeletal: Widespread areas of lucency are noted in the proximal femurs bilaterally, suspicious for osseous metastases. IMPRESSION: 1. Today's study demonstrates some regression of the large partially cavitary right upper lobe lung mass, as well as regression of previously noted mediastinal lymphadenopathy and regression of the metastatic lesions in the liver and right adrenal gland. Widespread metastatic disease to the bones is also noted on today's examination with multiple pathologic fractures, as detailed above. 2. Aortic atherosclerosis, in addition to left main and 3 vessel coronary artery disease. 3. Ectasia of ascending  thoracic aorta (4.2 cm in diameter). Recommend annual imaging followup by CTA or MRA. This recommendation follows 2010 ACCF/AHA/AATS/ACR/ASA/SCA/SCAI/SIR/STS/SVM Guidelines for the Diagnosis and Management of Patients with Thoracic Aortic Disease. Circulation. 2010; 121: L381-O175. Aortic aneurysm NOS (ICD10-I71.9). 4. Additional incidental findings, as above. Electronically Signed   By: Vinnie Langton M.D.   On: 03/02/2021 20:57     ASSESSMENT/PLAN:  This is a very pleasant 75 year old Caucasian female diagnosed with stage IV (T3, N2, M1 C) non-small cell lung cancer with unknown histologic subtype questionably likely to be squamous cell carcinoma based on the morphology of the right upper lobe cavitary mass with mediastinal lymphadenopathy metastatic disease to the bones as well as the right adrenal gland.  She was diagnosed in June 2022.  Adenocarcinoma cannot be completely ruled out especially with the K-ras G 12 C mutation.  Since the patient has an actionable mutation with K-ras G 12 C, this can be used as an option in the second line setting.  The patient completed palliative radiotherapy to multiple metastatic bone lesions under the care of Dr. Sondra Come.  The patient is currently undergoing systemic chemotherapy with carboplatin AUC 5, Alimta 500 mg per metered squared, Keytruda 200 mg IV every 3 weeks.  She is status post 3 cycles.  The patient recently had a restaging CT scan performed.  Dr. Julien Nordmann personally and independently reviewed the scan and discussed results with the patient today.  Scan showed improvement in her disease.  Dr. Julien Nordmann recommends she continue on the same treatment at the same dose.   The patient will receive with cycle #4 today scheduled.  She will continue to receive Zometa every 6 weeks for her metastatic bone lesions.   We will see her back for follow-up visit in 3 weeks for evaluation before starting cycle #5  Since she is not having any pain with the 30 mg of  morphine twice daily, we will reduce her dose to 15 mg twice daily.  I have sent a refill to the pharmacy.  If she continues to have the absence of any pain, we will work on tapering off.  She will continue taking Remeron for decreased appetite.  Megace was discontinued due to her recent small pulmonary embolism although she had not started taking it yet.   She will continue drinking formula 3 times a day as well as an additional protein shake.  Encouraged to elevate her extremities for swelling.  She will continue to be compliant with her Eliquis for her history of PE.  The patient was advised to call immediately if she has any concerning symptoms in the interval. The patient voices understanding of current disease status and treatment options and is in agreement with the current care plan. All questions were answered. The patient knows to call the clinic with any problems, questions or concerns. We can certainly see the patient much sooner if necessary   No orders of the defined types were placed in this encounter.     Tiger Spieker L Kimsey Demaree, PA-C 03/03/21  ADDENDUM: Hematology/Oncology Attending: I had a face-to-face encounter with the patient today.  I reviewed her record, lab and scan and recommended her care plan.  This is a very pleasant 75 years old white female diagnosed with a stage IV non-small cell lung cancer, adenocarcinoma with positive KRAS G12C mutation in June 2022.  The patient is currently undergoing palliative systemic chemotherapy with carboplatin for AUC of 5, Alimta 500 Mg/M2 and Keytruda 200 Mg IV every 3 weeks status post 3 cycles.  She has been tolerating her treatment well and she has significant improvement in her symptoms.  She is currently on pain medication with MS Contin 30 mg p.o. twice daily and she is wondering about cutting down in the dose since she does not have a lot of pain at this point. I recommended for the patient to decrease the dose of MS  Contin to 15 mg p.o. twice daily before considering tapering her completely if needed. The patient had repeat CT scan of the chest, abdomen pelvis performed recently.  I personally and independently reviewed the scans and discussed the results with the patient and her husband. Her scan showed general improvement of her disease. I recommended for her to continue her current treatment with carboplatin, Alimta and Keytruda for cycle #4. I will see her back for follow-up visit in 3 weeks for evaluation before starting cycle #5 with maintenance Alimta and Keytruda every 3 weeks. For the lack of appetite and depression, the patient will continue her current treatment with Remeron. She was advised to call immediately if she  has any concerning symptoms in the interval. The total time spent in the appointment was 30 minutes.  Disclaimer: This note was dictated with voice recognition software. Similar sounding words can inadvertently be transcribed and may be missed upon review. Eilleen Kempf, MD 03/03/21

## 2021-03-01 ENCOUNTER — Ambulatory Visit (HOSPITAL_COMMUNITY)
Admission: RE | Admit: 2021-03-01 | Discharge: 2021-03-01 | Disposition: A | Discharge status: Home / Self Care | Payer: Medicare HMO | Source: Ambulatory Visit | Attending: Internal Medicine | Admitting: Internal Medicine

## 2021-03-01 ENCOUNTER — Other Ambulatory Visit: Payer: Self-pay

## 2021-03-01 DIAGNOSIS — C3490 Malignant neoplasm of unspecified part of unspecified bronchus or lung: Secondary | ICD-10-CM | POA: Insufficient documentation

## 2021-03-01 DIAGNOSIS — I77810 Thoracic aortic ectasia: Secondary | ICD-10-CM | POA: Diagnosis not present

## 2021-03-01 DIAGNOSIS — Z0389 Encounter for observation for other suspected diseases and conditions ruled out: Secondary | ICD-10-CM | POA: Diagnosis not present

## 2021-03-01 DIAGNOSIS — I700 Atherosclerosis of aorta: Secondary | ICD-10-CM | POA: Diagnosis not present

## 2021-03-01 DIAGNOSIS — R918 Other nonspecific abnormal finding of lung field: Secondary | ICD-10-CM | POA: Diagnosis not present

## 2021-03-01 MED ORDER — IOHEXOL 350 MG/ML SOLN
80.0000 mL | Freq: Once | INTRAVENOUS | Status: AC | PRN
  Administered 2021-03-01: 80 mL via INTRAVENOUS

## 2021-03-03 ENCOUNTER — Inpatient Hospital Stay: Payer: Medicare HMO

## 2021-03-03 ENCOUNTER — Encounter: Payer: Self-pay | Admitting: General Practice

## 2021-03-03 ENCOUNTER — Inpatient Hospital Stay (HOSPITAL_BASED_OUTPATIENT_CLINIC_OR_DEPARTMENT_OTHER): Payer: Medicare HMO | Admitting: Physician Assistant

## 2021-03-03 ENCOUNTER — Other Ambulatory Visit: Payer: Self-pay

## 2021-03-03 VITALS — BP 149/72 | HR 66 | Temp 98.8°F | Resp 17 | Wt 93.8 lb

## 2021-03-03 DIAGNOSIS — C7951 Secondary malignant neoplasm of bone: Secondary | ICD-10-CM

## 2021-03-03 DIAGNOSIS — C3491 Malignant neoplasm of unspecified part of right bronchus or lung: Secondary | ICD-10-CM

## 2021-03-03 DIAGNOSIS — Z23 Encounter for immunization: Secondary | ICD-10-CM | POA: Diagnosis not present

## 2021-03-03 DIAGNOSIS — I10 Essential (primary) hypertension: Secondary | ICD-10-CM | POA: Diagnosis not present

## 2021-03-03 DIAGNOSIS — Z86711 Personal history of pulmonary embolism: Secondary | ICD-10-CM | POA: Diagnosis not present

## 2021-03-03 DIAGNOSIS — G893 Neoplasm related pain (acute) (chronic): Secondary | ICD-10-CM

## 2021-03-03 DIAGNOSIS — Z5111 Encounter for antineoplastic chemotherapy: Secondary | ICD-10-CM

## 2021-03-03 DIAGNOSIS — Z7982 Long term (current) use of aspirin: Secondary | ICD-10-CM | POA: Diagnosis not present

## 2021-03-03 DIAGNOSIS — Z5112 Encounter for antineoplastic immunotherapy: Secondary | ICD-10-CM | POA: Diagnosis not present

## 2021-03-03 DIAGNOSIS — C3411 Malignant neoplasm of upper lobe, right bronchus or lung: Secondary | ICD-10-CM | POA: Diagnosis not present

## 2021-03-03 DIAGNOSIS — C7971 Secondary malignant neoplasm of right adrenal gland: Secondary | ICD-10-CM | POA: Diagnosis not present

## 2021-03-03 DIAGNOSIS — Z7901 Long term (current) use of anticoagulants: Secondary | ICD-10-CM | POA: Diagnosis not present

## 2021-03-03 LAB — CMP (CANCER CENTER ONLY)
ALT: 25 U/L (ref 0–44)
AST: 29 U/L (ref 15–41)
Albumin: 3.8 g/dL (ref 3.5–5.0)
Alkaline Phosphatase: 69 U/L (ref 38–126)
Anion gap: 7 (ref 5–15)
BUN: 11 mg/dL (ref 8–23)
CO2: 29 mmol/L (ref 22–32)
Calcium: 8.8 mg/dL — ABNORMAL LOW (ref 8.9–10.3)
Chloride: 100 mmol/L (ref 98–111)
Creatinine: 0.49 mg/dL (ref 0.44–1.00)
GFR, Estimated: >60 mL/min (ref >60)
Glucose, Bld: 118 mg/dL — ABNORMAL HIGH (ref 70–99)
Potassium: 3.4 mmol/L — ABNORMAL LOW (ref 3.5–5.1)
Sodium: 136 mmol/L (ref 135–145)
Total Bilirubin: 0.4 mg/dL (ref 0.3–1.2)
Total Protein: 6.5 g/dL (ref 6.5–8.1)

## 2021-03-03 LAB — CBC WITH DIFFERENTIAL (CANCER CENTER ONLY)
Abs Immature Granulocytes: 0.03 10*3/uL (ref 0.00–0.07)
Basophils Absolute: 0 10*3/uL (ref 0.0–0.1)
Basophils Relative: 0 %
Eosinophils Absolute: 0.1 10*3/uL (ref 0.0–0.5)
Eosinophils Relative: 1 %
HCT: 30.5 % — ABNORMAL LOW (ref 36.0–46.0)
Hemoglobin: 10 g/dL — ABNORMAL LOW (ref 12.0–15.0)
Immature Granulocytes: 1 %
Lymphocytes Relative: 36 %
Lymphs Abs: 1.6 10*3/uL (ref 0.7–4.0)
MCH: 31.1 pg (ref 26.0–34.0)
MCHC: 32.8 g/dL (ref 30.0–36.0)
MCV: 94.7 fL (ref 80.0–100.0)
Monocytes Absolute: 1 10*3/uL (ref 0.1–1.0)
Monocytes Relative: 22 %
Neutro Abs: 1.8 10*3/uL (ref 1.7–7.7)
Neutrophils Relative %: 40 %
Platelet Count: 296 10*3/uL (ref 150–400)
RBC: 3.22 MIL/uL — ABNORMAL LOW (ref 3.87–5.11)
RDW: 21.2 % — ABNORMAL HIGH (ref 11.5–15.5)
WBC Count: 4.5 10*3/uL (ref 4.0–10.5)
nRBC: 0 % (ref 0.0–0.2)

## 2021-03-03 LAB — TSH: TSH: 3.131 u[IU]/mL (ref 0.308–3.960)

## 2021-03-03 MED ORDER — SODIUM CHLORIDE 0.9 % IV SOLN
200.0000 mg | Freq: Once | INTRAVENOUS | Status: AC
  Administered 2021-03-03: 200 mg via INTRAVENOUS
  Filled 2021-03-03: qty 8

## 2021-03-03 MED ORDER — CYANOCOBALAMIN 1000 MCG/ML IJ SOLN
1000.0000 ug | Freq: Once | INTRAMUSCULAR | Status: DC
  Filled 2021-03-03: qty 1

## 2021-03-03 MED ORDER — PALONOSETRON HCL INJECTION 0.25 MG/5ML
0.2500 mg | Freq: Once | INTRAVENOUS | Status: AC
  Administered 2021-03-03: 0.25 mg via INTRAVENOUS
  Filled 2021-03-03: qty 5

## 2021-03-03 MED ORDER — SODIUM CHLORIDE 0.9 % IV SOLN: Freq: Once | INTRAVENOUS | Status: AC

## 2021-03-03 MED ORDER — SODIUM CHLORIDE 0.9 % IV SOLN
304.5000 mg | Freq: Once | INTRAVENOUS | Status: AC
  Administered 2021-03-03: 300 mg via INTRAVENOUS
  Filled 2021-03-03: qty 30

## 2021-03-03 MED ORDER — SODIUM CHLORIDE 0.9 % IV SOLN
10.0000 mg | Freq: Once | INTRAVENOUS | Status: AC
  Administered 2021-03-03: 10 mg via INTRAVENOUS
  Filled 2021-03-03: qty 10

## 2021-03-03 MED ORDER — INFLUENZA VAC A&B SA ADJ QUAD 0.5 ML IM PRSY
0.5000 mL | PREFILLED_SYRINGE | Freq: Once | INTRAMUSCULAR | Status: AC
  Administered 2021-03-03: 0.5 mL via INTRAMUSCULAR
  Filled 2021-03-03: qty 0.5

## 2021-03-03 MED ORDER — SODIUM CHLORIDE 0.9 % IV SOLN
150.0000 mg | Freq: Once | INTRAVENOUS | Status: AC
  Administered 2021-03-03: 150 mg via INTRAVENOUS
  Filled 2021-03-03: qty 150

## 2021-03-03 MED ORDER — MORPHINE SULFATE ER 15 MG PO TBCR: 15.0000 mg | EXTENDED_RELEASE_TABLET | Freq: Two times a day (BID) | ORAL | 0 refills | Status: DC

## 2021-03-03 MED ORDER — SODIUM CHLORIDE 0.9 % IV SOLN
500.0000 mg/m2 | Freq: Once | INTRAVENOUS | Status: AC
  Administered 2021-03-03: 700 mg via INTRAVENOUS
  Filled 2021-03-03: qty 8

## 2021-03-03 NOTE — Patient Instructions (Addendum)
[facility]  Discharge Instructions: Thank you for choosing [facility] to provide your oncology and hematology care.   If you have a lab appointment with the [facility], please go directly to the [facility] and check in at the registration area.   Wear comfortable clothing and clothing appropriate for easy access to any Portacath or PICC line.   We strive to give you quality time with your provider. You may need to reschedule your appointment if you arrive late (15 or more minutes).  Arriving late affects you and other patients whose appointments are after yours.  Also, if you miss three or more appointments without notifying the office, you may be dismissed from the clinic at the provider's discretion.      For prescription refill requests, have your pharmacy contact our office and allow 72 hours for refills to be completed.    Today you received the following chemotherapy and/or immunotherapy agents: Pembrolizumab (Keytruda), Pemetrexed (Alimta), and Carboplatin.    To help prevent nausea and vomiting after your treatment, we encourage you to take your nausea medication as directed.  BELOW ARE SYMPTOMS THAT SHOULD BE REPORTED IMMEDIATELY: *FEVER GREATER THAN 100.4 F (38 C) OR HIGHER *CHILLS OR SWEATING *NAUSEA AND VOMITING THAT IS NOT CONTROLLED WITH YOUR NAUSEA MEDICATION *UNUSUAL SHORTNESS OF BREATH *UNUSUAL BRUISING OR BLEEDING *URINARY PROBLEMS (pain or burning when urinating, or frequent urination) *BOWEL PROBLEMS (unusual diarrhea, constipation, pain near the anus) TENDERNESS IN MOUTH AND THROAT WITH OR WITHOUT PRESENCE OF ULCERS (sore throat, sores in mouth, or a toothache) UNUSUAL RASH, SWELLING OR PAIN  UNUSUAL VAGINAL DISCHARGE OR ITCHING   Items with * indicate a potential emergency and should be followed up as soon as possible or go to the Emergency Department if any problems should occur.  Please show the CHEMOTHERAPY  ALERT CARD or IMMUNOTHERAPY ALERT CARD at check-in to the Emergency Department and triage nurse.  Should you have questions after your visit or need to cancel or reschedule your appointment, please contact [facility]  Dept: [phone]  and follow the prompts.  Office hours are 8:00 a.m. to 4:30 p.m. Monday - Friday. Please note that voicemails left after 4:00 p.m. may not be returned until the following business day.  We are closed weekends and major holidays. You have access to a nurse at all times for urgent questions. Please call the main number to the clinic Dept: [phone] and follow the prompts.   For any non-urgent questions, you may also contact your provider using MyChart. We now offer e-Visits for anyone 43 and older to request care online for non-urgent symptoms. For details visit [email or web address].   Also download the MyChart app! Go to the app store, search "MyChart", open the app, select [facility], and log in with your MyChart username and password.  Due to Covid, a mask is required upon entering the hospital/clinic. If you do not have a mask, one will be given to you upon arrival. For doctor visits, patients may have 1 support person aged 58 or older with them. For treatment visits, patients cannot have anyone with them due to current Covid guidelines and our immunocompromised population.   Please take Vitamin B12 1000 mcg orally daily per Dr. Julien Nordmann.   Influenza Virus Vaccine injection What is this medication? INFLUENZA VIRUS VACCINE (in floo EN zuh VAHY ruhs vak SEEN) helps to reduce the risk of getting influenza also known as the flu. The  vaccine only helps protect you against some strains of the flu. This medicine may be used for other purposes; ask your health care provider or pharmacist if you have questions. COMMON BRAND NAME(S): Afluria, Afluria Quadrivalent, Agriflu, Alfuria, FLUAD, FLUAD Quadrivalent, Fluarix, Fluarix  Quadrivalent, Flublok, Flublok Quadrivalent, FLUCELVAX, FLUCELVAX Quadrivalent, Flulaval, Flulaval Quadrivalent, Fluvirin, Fluzone, Fluzone High-Dose, Fluzone Intradermal, Fluzone Quadrivalent What should I tell my care team before I take this medication? They need to know if you have any of these conditions: bleeding disorder like hemophilia fever or infection Guillain-Barre syndrome or other neurological problems immune system problems infection with the human immunodeficiency virus (HIV) or AIDS low blood platelet counts multiple sclerosis an unusual or allergic reaction to influenza virus vaccine, latex, other medicines, foods, dyes, or preservatives. Different brands of vaccines contain different allergens. Some may contain latex or eggs. Talk to your doctor about your allergies to make sure that you get the right vaccine. pregnant or trying to get pregnant breast-feeding How should I use this medication? This vaccine is for injection into a muscle or under the skin. It is given by a health care professional. A copy of Vaccine Information Statements will be given before each vaccination. Read this sheet carefully each time. The sheet may change frequently. Talk to your healthcare provider to see which vaccines are right for you. Some vaccines should not be used in all age groups. Overdosage: If you think you have taken too much of this medicine contact a poison control center or emergency room at once. NOTE: This medicine is only for you. Do not share this medicine with others. What if I miss a dose? This does not apply. What may interact with this medication? chemotherapy or radiation therapy medicines that lower your immune system like etanercept, anakinra, infliximab, and adalimumab medicines that treat or prevent blood clots like warfarin phenytoin steroid medicines like prednisone or cortisone theophylline vaccines This list may not describe all possible interactions. Give your  health care provider a list of all the medicines, herbs, non-prescription drugs, or dietary supplements you use. Also tell them if you smoke, drink alcohol, or use illegal drugs. Some items may interact with your medicine. What should I watch for while using this medication? Report any side effects that do not go away within 3 days to your doctor or health care professional. Call your health care provider if any unusual symptoms occur within 6 weeks of receiving this vaccine. You may still catch the flu, but the illness is not usually as bad. You cannot get the flu from the vaccine. The vaccine will not protect against colds or other illnesses that may cause fever. The vaccine is needed every year. What side effects may I notice from receiving this medication? Side effects that you should report to your doctor or health care professional as soon as possible: allergic reactions like skin rash, itching or hives, swelling of the face, lips, or tongue Side effects that usually do not require medical attention (report to your doctor or health care professional if they continue or are bothersome): fever headache muscle aches and pains pain, tenderness, redness, or swelling at the injection site tiredness Side effects that you should report to your doctor or health care professional as soon as possible: allergic reactions like skin rash, itching or hives, swelling of the face, lips, or tongue Side effects that usually do not require medical attention (report to your doctor or health care professional if they continue or are bothersome): fever headache muscle aches  and pains pain, tenderness, redness, or swelling at the injection site tiredness This list may not describe all possible side effects. Call your doctor for medical advice about side effects. You may report side effects to FDA at 1-800-FDA-1088. Where should I keep my medication? The vaccine will be given by a health care professional in a  clinic, pharmacy, doctor's office, or other health care setting. You will not be given vaccine doses to store at home. NOTE: This sheet is a summary. It may not cover all possible information. If you have questions about this medicine, talk to your doctor, pharmacist, or health care provider.  2022 Elsevier/Gold Standard (2020-01-13 19:49:22)

## 2021-03-03 NOTE — Progress Notes (Signed)
[facility] Spiritual Care Note  Kristi Jennings was upbeat during this follow-up visit in infusion. She notes that she is coping well and feeling good overall. She reports good support--almost too much support!-- as she navigates treatment, Palliative Care home visits, and other layers.  At this time, Kristi Jennings prefers to reach out herself as needed/desired to reduce the number of contacts she is juggling.   [place], [place], [facility] Pager [phone] Voicemail [phone]

## 2021-03-03 NOTE — Progress Notes (Signed)
Pt refused to have the flu vaccine given in her arm for fear of losing mobility in her arm. Pt agreed to have the vaccine injected into the left upper outer quadrant of her glute. RN informed Cassie, PA of this and was given the OK to proceed.  RN then proceeded to give the pt her flu vaccine in the left upper outer quadrant of her glute. Pt tolerated injection well with no complaints.

## 2021-03-04 ENCOUNTER — Telehealth: Payer: Self-pay | Admitting: Physician Assistant

## 2021-03-04 NOTE — Telephone Encounter (Signed)
Left message with follow-up appointment per 10/13 los.

## 2021-03-07 DIAGNOSIS — G5681 Other specified mononeuropathies of right upper limb: Secondary | ICD-10-CM | POA: Diagnosis not present

## 2021-03-07 DIAGNOSIS — M25511 Pain in right shoulder: Secondary | ICD-10-CM | POA: Diagnosis not present

## 2021-03-08 DIAGNOSIS — C3491 Malignant neoplasm of unspecified part of right bronchus or lung: Secondary | ICD-10-CM | POA: Diagnosis not present

## 2021-03-09 DIAGNOSIS — I10 Essential (primary) hypertension: Secondary | ICD-10-CM | POA: Diagnosis not present

## 2021-03-09 DIAGNOSIS — I2699 Other pulmonary embolism without acute cor pulmonale: Secondary | ICD-10-CM | POA: Diagnosis not present

## 2021-03-09 DIAGNOSIS — E785 Hyperlipidemia, unspecified: Secondary | ICD-10-CM | POA: Diagnosis not present

## 2021-03-09 DIAGNOSIS — M25511 Pain in right shoulder: Secondary | ICD-10-CM | POA: Diagnosis not present

## 2021-03-09 DIAGNOSIS — C3491 Malignant neoplasm of unspecified part of right bronchus or lung: Secondary | ICD-10-CM | POA: Diagnosis not present

## 2021-03-09 DIAGNOSIS — M8448XD Pathological fracture, other site, subsequent encounter for fracture with routine healing: Secondary | ICD-10-CM | POA: Diagnosis not present

## 2021-03-09 DIAGNOSIS — M5416 Radiculopathy, lumbar region: Secondary | ICD-10-CM | POA: Diagnosis not present

## 2021-03-09 DIAGNOSIS — J309 Allergic rhinitis, unspecified: Secondary | ICD-10-CM | POA: Diagnosis not present

## 2021-03-09 DIAGNOSIS — M353 Polymyalgia rheumatica: Secondary | ICD-10-CM | POA: Diagnosis not present

## 2021-03-09 DIAGNOSIS — E119 Type 2 diabetes mellitus without complications: Secondary | ICD-10-CM | POA: Diagnosis not present

## 2021-03-10 ENCOUNTER — Other Ambulatory Visit: Payer: Self-pay

## 2021-03-10 ENCOUNTER — Inpatient Hospital Stay: Payer: Medicare HMO

## 2021-03-10 DIAGNOSIS — Z23 Encounter for immunization: Secondary | ICD-10-CM | POA: Diagnosis not present

## 2021-03-10 DIAGNOSIS — Z7901 Long term (current) use of anticoagulants: Secondary | ICD-10-CM | POA: Diagnosis not present

## 2021-03-10 DIAGNOSIS — Z86711 Personal history of pulmonary embolism: Secondary | ICD-10-CM | POA: Diagnosis not present

## 2021-03-10 DIAGNOSIS — Z7982 Long term (current) use of aspirin: Secondary | ICD-10-CM | POA: Diagnosis not present

## 2021-03-10 DIAGNOSIS — Z5112 Encounter for antineoplastic immunotherapy: Secondary | ICD-10-CM | POA: Diagnosis not present

## 2021-03-10 DIAGNOSIS — C3411 Malignant neoplasm of upper lobe, right bronchus or lung: Secondary | ICD-10-CM | POA: Diagnosis not present

## 2021-03-10 DIAGNOSIS — C3491 Malignant neoplasm of unspecified part of right bronchus or lung: Secondary | ICD-10-CM

## 2021-03-10 DIAGNOSIS — I10 Essential (primary) hypertension: Secondary | ICD-10-CM | POA: Diagnosis not present

## 2021-03-10 DIAGNOSIS — C7951 Secondary malignant neoplasm of bone: Secondary | ICD-10-CM | POA: Diagnosis not present

## 2021-03-10 DIAGNOSIS — Z5111 Encounter for antineoplastic chemotherapy: Secondary | ICD-10-CM | POA: Diagnosis not present

## 2021-03-10 DIAGNOSIS — C7971 Secondary malignant neoplasm of right adrenal gland: Secondary | ICD-10-CM | POA: Diagnosis not present

## 2021-03-10 LAB — CBC WITH DIFFERENTIAL (CANCER CENTER ONLY)
Abs Immature Granulocytes: 0.03 10*3/uL (ref 0.00–0.07)
Basophils Absolute: 0 10*3/uL (ref 0.0–0.1)
Basophils Relative: 0 %
Eosinophils Absolute: 0 10*3/uL (ref 0.0–0.5)
Eosinophils Relative: 1 %
HCT: 36.3 % (ref 36.0–46.0)
Hemoglobin: 11.9 g/dL — ABNORMAL LOW (ref 12.0–15.0)
Immature Granulocytes: 1 %
Lymphocytes Relative: 36 %
Lymphs Abs: 1.3 10*3/uL (ref 0.7–4.0)
MCH: 30.9 pg (ref 26.0–34.0)
MCHC: 32.8 g/dL (ref 30.0–36.0)
MCV: 94.3 fL (ref 80.0–100.0)
Monocytes Absolute: 0.5 10*3/uL (ref 0.1–1.0)
Monocytes Relative: 13 %
Neutro Abs: 1.7 10*3/uL (ref 1.7–7.7)
Neutrophils Relative %: 49 %
Platelet Count: 258 10*3/uL (ref 150–400)
RBC: 3.85 MIL/uL — ABNORMAL LOW (ref 3.87–5.11)
RDW: 19.9 % — ABNORMAL HIGH (ref 11.5–15.5)
WBC Count: 3.5 10*3/uL — ABNORMAL LOW (ref 4.0–10.5)
nRBC: 0 % (ref 0.0–0.2)

## 2021-03-10 LAB — CMP (CANCER CENTER ONLY)
ALT: 19 U/L (ref 0–44)
AST: 25 U/L (ref 15–41)
Albumin: 3.9 g/dL (ref 3.5–5.0)
Alkaline Phosphatase: 87 U/L (ref 38–126)
Anion gap: 9 (ref 5–15)
BUN: 14 mg/dL (ref 8–23)
CO2: 28 mmol/L (ref 22–32)
Calcium: 9.2 mg/dL (ref 8.9–10.3)
Chloride: 99 mmol/L (ref 98–111)
Creatinine: 0.64 mg/dL (ref 0.44–1.00)
GFR, Estimated: >60 mL/min (ref >60)
Glucose, Bld: 115 mg/dL — ABNORMAL HIGH (ref 70–99)
Potassium: 3.7 mmol/L (ref 3.5–5.1)
Sodium: 136 mmol/L (ref 135–145)
Total Bilirubin: 0.6 mg/dL (ref 0.3–1.2)
Total Protein: 6.9 g/dL (ref 6.5–8.1)

## 2021-03-14 ENCOUNTER — Other Ambulatory Visit: Payer: Self-pay | Admitting: Physician Assistant

## 2021-03-14 ENCOUNTER — Telehealth: Payer: Self-pay

## 2021-03-14 DIAGNOSIS — C3491 Malignant neoplasm of unspecified part of right bronchus or lung: Secondary | ICD-10-CM | POA: Diagnosis not present

## 2021-03-14 DIAGNOSIS — C7951 Secondary malignant neoplasm of bone: Secondary | ICD-10-CM

## 2021-03-14 MED ORDER — MORPHINE SULFATE ER 15 MG PO TBCR: 15.0000 mg | EXTENDED_RELEASE_TABLET | Freq: Two times a day (BID) | ORAL | 0 refills | Status: DC

## 2021-03-14 NOTE — Telephone Encounter (Signed)
Pts husband called for a RF of Morphine 15mg .

## 2021-03-16 ENCOUNTER — Other Ambulatory Visit: Payer: Self-pay

## 2021-03-16 ENCOUNTER — Ambulatory Visit (INDEPENDENT_AMBULATORY_CARE_PROVIDER_SITE_OTHER): Payer: Medicare HMO | Admitting: Rehabilitative and Restorative Service Providers"

## 2021-03-16 ENCOUNTER — Encounter: Payer: Self-pay | Admitting: Rehabilitative and Restorative Service Providers"

## 2021-03-16 DIAGNOSIS — G2589 Other specified extrapyramidal and movement disorders: Secondary | ICD-10-CM

## 2021-03-16 DIAGNOSIS — R29898 Other symptoms and signs involving the musculoskeletal system: Secondary | ICD-10-CM | POA: Diagnosis not present

## 2021-03-16 DIAGNOSIS — G5681 Other specified mononeuropathies of right upper limb: Secondary | ICD-10-CM | POA: Diagnosis not present

## 2021-03-16 NOTE — Patient Instructions (Signed)
Access Code: [email or web address] URL: [email or web address] Date: 03/16/2021 Prepared by: Gillermo Murdoch  Exercises Supine Scapular Retraction - 2 x daily - 7 x weekly - 1 sets - 10 reps - 5-10 sec hold Supine Shoulder Flexion AAROM with Hands Clasped - 2 x daily - 7 x weekly - 1 sets - 3 reps - 5 sec hold Dead Bug Alternating Arm Extension - 2 x daily - 7 x weekly - 1-2 sets - 10 reps - 2 sec hold Supine Shoulder Rhythmic Stabilization- Horizontal Abduction/Adduction - 2 x daily - 7 x weekly - 1 sets - 10 reps Wall Push Up - 2 x daily - 7 x weekly - 1 sets - 10 reps - 3 sec hold

## 2021-03-16 NOTE — Therapy (Signed)
[facility] [place], [place], [address] Phone: [phone]   Fax:  [phone]  Physical Therapy Evaluation  Patient Details  Name: Kristi Jennings MRN: [id number] Date of Birth: 1945/06/18 Referring Provider (PT): Dr Vickki Hearing   Encounter Date: 03/16/2021   PT End of Session - 03/16/21 1429     Visit Number 1    Number of Visits 12    Date for PT Re-Evaluation 04/27/21    PT Start Time 5176    PT Stop Time 1420    PT Time Calculation (min) 43 min    Activity Tolerance Patient tolerated treatment well             Past Medical History:  Diagnosis Date   Borderline diabetes mellitus    Bronchogenic cancer of right lung ([facility])    Dyslipidemia    Hypertension    Polymyalgia rheumatica ([facility])     History reviewed. No pertinent surgical history.  There were no vitals filed for this visit.    Subjective Assessment - 03/16/21 1344     Subjective Patient reports that she has Rt shoulder pain that started over a month ago with no known injury. Symptoms started after she received a B12 injection in the Rt arm. She noticed pain and limited ROM after the injection. Movement in the Rt shoulder is stiff and weak; limited. ovement has improved in the past 1-2 weeks.    Pertinent History Stage 4 lung cancer undergoing radiation and chemotherapy; mets in chest cavity; arthritis    Diagnostic tests x-ray: No acute fracture or dislocation.  2. Irregularity of the greater tuberosity and high-riding humeral  head suggesting rotator cuff pathology.    Patient Stated Goals get ROM back in the Rt shoulder so she can use Rt arm for functional activities - such as reaching up to get something off a shelf    Currently in Pain? No/denies    Pain Score 0-No pain    Pain Location Shoulder    Pain Orientation Right    Aggravating Factors  difficulty lifting Rt arm                [facility] PT Assessment - 03/16/21 0001        Assessment   Medical Diagnosis Rt shoulder dysfunction    Referring Provider (PT) Dr Vickki Hearing    Onset Date/Surgical Date 02/03/21    Hand Dominance Right    Next MD Visit none scheduled    Prior Therapy for LBP in years past      Precautions   Precautions None      Restrictions   Weight Bearing Restrictions No      Balance Screen   Has the patient fallen in the past 6 months Yes    How many times? 1   slipping in yard   Has the patient had a decrease in activity level because of a fear of falling?  No    Is the patient reluctant to leave their home because of a fear of falling?  No      Home Ecologist residence    Living Arrangements Spouse/significant other      Prior Function   Level of Independence Independent    Vocation Retired    Lawyer x 8 yrs; counseling for 35 yrs    Leisure household chores; walking when she feels she can was walking ~ 1 mile/day - planning  to resume walking program      Sensation   Additional Comments WFL's per pt report      Posture/Postural Control   Posture Comments head forward; shoulders rounded and elevated; scapulae abducted and rottaed along the thoracic wall      AROM   Right Shoulder Extension 63 Degrees    Right Shoulder Flexion 60 Degrees    Right Shoulder ABduction 74 Degrees    Right Shoulder Internal Rotation --   thumb T9   Right Shoulder External Rotation 70 Degrees    Left Shoulder Extension 50 Degrees    Left Shoulder Flexion 145 Degrees    Left Shoulder ABduction 157 Degrees    Left Shoulder Internal Rotation --   thumb T7   Left Shoulder External Rotation 90 Degrees      Strength   Right Shoulder Flexion 2-/5    Right Shoulder Extension 3+/5    Right Shoulder ABduction 2-/5    Right Shoulder Internal Rotation 4/5    Right Shoulder External Rotation 4/5    Right Shoulder Horizontal ABduction 4/5    Left Shoulder Flexion 4+/5    Left  Shoulder Extension 5/5    Left Shoulder ABduction 4+/5    Left Shoulder Internal Rotation 4+/5    Left Shoulder External Rotation 4+/5    Left Shoulder Horizontal ABduction 5/5      Palpation   Palpation comment muscular tightness Rt shoulder girdle                        Objective measurements completed on examination: See above findings.       [facility] Adult PT Treatment/Exercise - 03/16/21 0001       Shoulder Exercises: Supine   Protraction Limitations punch to ceiling x 10    Flexion AAROM;Right;5 reps    Flexion Limitations 1-2 sec hold AAROM      Shoulder Exercises: ROM/Strengthening   Pushups 10 reps    Rhythmic Stabilization, Supine flex/ext; horiz ab/ad; circles CW/CCW x 10 each                     PT Education - 03/16/21 1417     Education Details HEP POC    Person(s) Educated Patient    Methods Explanation;Demonstration;Tactile cues;Verbal cues;Handout    Comprehension Verbalized understanding;Returned demonstration;Verbal cues required;Tactile cues required                 PT Long Term Goals - 03/16/21 1521       PT LONG TERM GOAL #1   Title Increase strength Rt shoulder to 4/5 to 5/5 throughout    Time 6    Period Weeks    Status New    Target Date 04/27/21      PT LONG TERM GOAL #2   Title Increase AROM Rt shoulder to =/> than AROM Lt shoulder    Time 6    Period Weeks    Status New    Target Date 04/27/21      PT LONG TERM GOAL #3   Title Patient to report improved strength and ROM allowing her to use Rt UE for normal functional activities/ADL's    Time 6    Period Weeks    Status New    Target Date 04/27/21      PT LONG TERM GOAL #4   Title Independent in HEP    Time 6    Period Weeks    Status  New    Target Date 04/27/21                    Plan - 03/16/21 1430     Clinical Impression Statement Patient presents with Rt shoulder weakness following B12 injection in Rt arm ~ 1 month ago. She  initially had pan but now has only weakness when lifting her arm. Patient has significant weakness in Rt shoulder; abnormal postue and alignment; poor scapular stability; muscular tightness to palpation in the Rt shoulder girdle compared to Lt; decreased functional abilities and ADL's associated with Rt shoulder weakness. Patient will benefit from PT to address problems identified.    Stability/Clinical Decision Making Stable/Uncomplicated    Clinical Decision Making Low    Rehab Potential Good    PT Frequency 2x / week    PT Duration 6 weeks    PT Treatment/Interventions ADLs/Self Care Home Management;Aquatic Therapy;Cryotherapy;Functional mobility training;Therapeutic activities;Therapeutic exercise;Neuromuscular re-education;Patient/family education;Manual techniques;Passive range of motion;Dry needling;Taping    PT Next Visit Plan review HEP; progress with ROM and strengthening Rt shoulder; posterior shoulder girdle strengthening; manual work as indicated (no modalities due to active CA)    PT Home Exercise Plan [email or web address]    Consulted and Agree with Plan of Care Patient             Patient will benefit from skilled therapeutic intervention in order to improve the following deficits and impairments:  Decreased activity tolerance, Impaired UE functional use, Pain, Improper body mechanics, Impaired flexibility, Decreased mobility, Decreased strength, Postural dysfunction  Visit Diagnosis: Shoulder weakness  Scapular dyskinesis  Other symptoms and signs involving the musculoskeletal system     Problem List Patient Active Problem List   Diagnosis Date Noted   Metastatic bone tumor ([facility]) 01/20/2021   Pulmonary emboli ([facility]) 01/01/2021   Compression fracture of T12 vertebra ([place]) 01/01/2021   Lumbar radiculopathy 01/01/2021   Non-small cell carcinoma of lung, stage 4, right ([facility]) 11/30/2020   Encounter for antineoplastic chemotherapy 11/30/2020   Encounter for antineoplastic  immunotherapy 11/30/2020   Cancer associated pain 11/30/2020   Malnutrition, calorie ([facility]) 11/30/2020   Polymyalgia rheumatica ([place]) 08/12/2019   Allergic rhinitis 06/10/2015   Anxiety 06/10/2015   Benign essential hypertension 06/10/2015    Kristi Jennings Nilda Simmer, PT, MPH  03/16/2021, 4:56 PM  [facility] [place] [address] [place], [place], [address] Phone: [phone]   Fax:  [phone]  Name: NEVIAH BRAUD MRN: [id number] Date of Birth: [birth year]

## 2021-03-17 ENCOUNTER — Other Ambulatory Visit: Payer: Self-pay

## 2021-03-17 ENCOUNTER — Inpatient Hospital Stay: Payer: Medicare HMO

## 2021-03-17 DIAGNOSIS — Z5112 Encounter for antineoplastic immunotherapy: Secondary | ICD-10-CM | POA: Diagnosis not present

## 2021-03-17 DIAGNOSIS — Z23 Encounter for immunization: Secondary | ICD-10-CM | POA: Diagnosis not present

## 2021-03-17 DIAGNOSIS — I10 Essential (primary) hypertension: Secondary | ICD-10-CM | POA: Diagnosis not present

## 2021-03-17 DIAGNOSIS — Z7901 Long term (current) use of anticoagulants: Secondary | ICD-10-CM | POA: Diagnosis not present

## 2021-03-17 DIAGNOSIS — C3411 Malignant neoplasm of upper lobe, right bronchus or lung: Secondary | ICD-10-CM | POA: Diagnosis not present

## 2021-03-17 DIAGNOSIS — C3491 Malignant neoplasm of unspecified part of right bronchus or lung: Secondary | ICD-10-CM

## 2021-03-17 DIAGNOSIS — Z5111 Encounter for antineoplastic chemotherapy: Secondary | ICD-10-CM | POA: Diagnosis not present

## 2021-03-17 DIAGNOSIS — C7951 Secondary malignant neoplasm of bone: Secondary | ICD-10-CM | POA: Diagnosis not present

## 2021-03-17 DIAGNOSIS — Z86711 Personal history of pulmonary embolism: Secondary | ICD-10-CM | POA: Diagnosis not present

## 2021-03-17 DIAGNOSIS — Z7982 Long term (current) use of aspirin: Secondary | ICD-10-CM | POA: Diagnosis not present

## 2021-03-17 DIAGNOSIS — C7971 Secondary malignant neoplasm of right adrenal gland: Secondary | ICD-10-CM | POA: Diagnosis not present

## 2021-03-17 LAB — CBC WITH DIFFERENTIAL (CANCER CENTER ONLY)
Abs Immature Granulocytes: 0.01 10*3/uL (ref 0.00–0.07)
Basophils Absolute: 0 10*3/uL (ref 0.0–0.1)
Basophils Relative: 0 %
Eosinophils Absolute: 0.1 10*3/uL (ref 0.0–0.5)
Eosinophils Relative: 2 %
HCT: 33.2 % — ABNORMAL LOW (ref 36.0–46.0)
Hemoglobin: 11 g/dL — ABNORMAL LOW (ref 12.0–15.0)
Immature Granulocytes: 0 %
Lymphocytes Relative: 23 %
Lymphs Abs: 1 10*3/uL (ref 0.7–4.0)
MCH: 31.6 pg (ref 26.0–34.0)
MCHC: 33.1 g/dL (ref 30.0–36.0)
MCV: 95.4 fL (ref 80.0–100.0)
Monocytes Absolute: 0.8 10*3/uL (ref 0.1–1.0)
Monocytes Relative: 19 %
Neutro Abs: 2.4 10*3/uL (ref 1.7–7.7)
Neutrophils Relative %: 56 %
Platelet Count: 114 10*3/uL — ABNORMAL LOW (ref 150–400)
RBC: 3.48 MIL/uL — ABNORMAL LOW (ref 3.87–5.11)
RDW: 20.2 % — ABNORMAL HIGH (ref 11.5–15.5)
WBC Count: 4.4 10*3/uL (ref 4.0–10.5)
nRBC: 0 % (ref 0.0–0.2)

## 2021-03-17 LAB — CMP (CANCER CENTER ONLY)
ALT: 16 U/L (ref 0–44)
AST: 22 U/L (ref 15–41)
Albumin: 3.7 g/dL (ref 3.5–5.0)
Alkaline Phosphatase: 77 U/L (ref 38–126)
Anion gap: 8 (ref 5–15)
BUN: 5 mg/dL — ABNORMAL LOW (ref 8–23)
CO2: 26 mmol/L (ref 22–32)
Calcium: 8.9 mg/dL (ref 8.9–10.3)
Chloride: 104 mmol/L (ref 98–111)
Creatinine: 0.63 mg/dL (ref 0.44–1.00)
GFR, Estimated: >60 mL/min (ref >60)
Glucose, Bld: 86 mg/dL (ref 70–99)
Potassium: 3.9 mmol/L (ref 3.5–5.1)
Sodium: 138 mmol/L (ref 135–145)
Total Bilirubin: 0.6 mg/dL (ref 0.3–1.2)
Total Protein: 6.5 g/dL (ref 6.5–8.1)

## 2021-03-21 ENCOUNTER — Telehealth: Payer: Self-pay | Admitting: Medical Oncology

## 2021-03-21 DIAGNOSIS — C3490 Malignant neoplasm of unspecified part of unspecified bronchus or lung: Secondary | ICD-10-CM | POA: Diagnosis not present

## 2021-03-21 NOTE — Telephone Encounter (Signed)
Requests weekly labs to be done at [facility] except when she sees [facility].  The weekly lab is scheduled for [facility] already. [facility] notified.

## 2021-03-23 ENCOUNTER — Encounter: Payer: Self-pay | Admitting: Rehabilitative and Restorative Service Providers"

## 2021-03-23 ENCOUNTER — Other Ambulatory Visit: Payer: Self-pay | Admitting: Medical Oncology

## 2021-03-23 ENCOUNTER — Ambulatory Visit (INDEPENDENT_AMBULATORY_CARE_PROVIDER_SITE_OTHER): Payer: Medicare HMO | Admitting: Rehabilitative and Restorative Service Providers"

## 2021-03-23 ENCOUNTER — Other Ambulatory Visit: Payer: Self-pay

## 2021-03-23 DIAGNOSIS — R29898 Other symptoms and signs involving the musculoskeletal system: Secondary | ICD-10-CM | POA: Diagnosis not present

## 2021-03-23 DIAGNOSIS — C3491 Malignant neoplasm of unspecified part of right bronchus or lung: Secondary | ICD-10-CM

## 2021-03-23 DIAGNOSIS — G2589 Other specified extrapyramidal and movement disorders: Secondary | ICD-10-CM | POA: Diagnosis not present

## 2021-03-23 NOTE — Patient Instructions (Signed)
Access Code: [email or web address] URL: [email or web address] Date: 03/23/2021 Prepared by: Gillermo Murdoch  Exercises Supine Scapular Retraction - 2 x daily - 7 x weekly - 1 sets - 10 reps - 5-10 sec hold Supine Shoulder Flexion AAROM with Hands Clasped - 2 x daily - 7 x weekly - 1 sets - 3 reps - 5 sec hold Dead Bug Alternating Arm Extension - 2 x daily - 7 x weekly - 1-2 sets - 10 reps - 2 sec hold Supine Shoulder Rhythmic Stabilization- Horizontal Abduction/Adduction - 2 x daily - 7 x weekly - 1 sets - 10 reps Wall Push Up - 2 x daily - 7 x weekly - 1 sets - 10 reps - 3 sec hold Seated Shoulder Flexion AAROM with Pulley Behind - 2 x daily - 7 x weekly - 1 sets - 10 reps - 10 sec hold Seated Shoulder Scaption AAROM with Pulley at Side - 2 x daily - 7 x weekly - 1 sets - 10 reps - 10sec hold Shoulder Flexion Wall Slide with Towel - 2 x daily - 7 x weekly - 1 sets - 5 reps - 2-3 sec hold Supine Shoulder Horizontal Abduction with Resistance - 2 x daily - 7 x weekly - 1-2 sets - 10 reps - 2-3 sec hold

## 2021-03-23 NOTE — Therapy (Signed)
[place], [place], [address] Phone: [phone]   Fax:  [phone]  Physical Therapy Treatment  Patient Details  Name: Kristi Jennings MRN: [id number] Date of Birth: 1945-06-09 Referring Provider (PT): Dr Vickki Hearing   Encounter Date: 03/23/2021   PT End of Session - 03/23/21 1147     Visit Number 2    Number of Visits 12    Date for PT Re-Evaluation 04/27/21    PT Start Time 1146    PT Stop Time 1230    PT Time Calculation (min) 44 min    Activity Tolerance Patient tolerated treatment well             Past Medical History:  Diagnosis Date   Borderline diabetes mellitus    Bronchogenic cancer of right lung ([facility])    Dyslipidemia    Hypertension    Polymyalgia rheumatica ([facility])     History reviewed. No pertinent surgical history.  There were no vitals filed for this visit.   Subjective Assessment - 03/23/21 1147     Subjective Patient reports that she has been feeling tired and has no "get up and go". Shoulder exercises are going OK. She feels like she is gaining some strength.    Currently in Pain? No/denies    Pain Score 0-No pain    Pain Location Shoulder                [facility] PT Assessment - 03/23/21 0001       Assessment   Medical Diagnosis Rt shoulder dysfunction    Referring Provider (PT) Dr Vickki Hearing    Onset Date/Surgical Date 02/03/21    Hand Dominance Right    Next MD Visit none scheduled    Prior Therapy for LBP in years past      AROM   Right Shoulder Flexion 58 Degrees    Right Shoulder ABduction 65 Degrees                           [facility] Adult PT Treatment/Exercise - 03/23/21 0001       Self-Care   Self-Care Other Self-Care Comments    Other Self-Care Comments  demonstrated pulley exercises for patient's husband and suggested purchase of pulley for home      Shoulder Exercises: Supine   Protraction Limitations punch to  ceiling x 10    Horizontal ABduction Strengthening;Both;10 reps;Theraband    Theraband Level (Shoulder Horizontal ABduction) Level 1 (Yellow)    External Rotation Strengthening;Right;10 reps;Theraband    Theraband Level (Shoulder External Rotation) Level 1 (Yellow)    Other Supine Exercises scap squeeze 10 sec x 5      Shoulder Exercises: Standing   Flexion AAROM;Strengthening;Right;Left;5 reps    Flexion Limitations sliding bilat UE's up wall and walking down with fingertips touching wall      Shoulder Exercises: Pulleys   Flexion Limitations 10 sec hold x 10 reps assisted lowering last rep    Scaption Limitations 10 sec hold x 10 reps assisted lowering last rep      Shoulder Exercises: ROM/Strengthening   Rhythmic Stabilization, Supine flex/ext; horiz ab/ad; circles CW/CCW x 10 each                     PT Education - 03/23/21 1224     Education Details HEP    Person(s) Educated Patient    Methods Explanation;Demonstration;Tactile cues;Verbal cues;Handout  Comprehension Verbalized understanding;Returned demonstration;Verbal cues required;Tactile cues required                 PT Long Term Goals - 03/16/21 1521       PT LONG TERM GOAL #1   Title Increase strength Rt shoulder to 4/5 to 5/5 throughout    Time 6    Period Weeks    Status New    Target Date 04/27/21      PT LONG TERM GOAL #2   Title Increase AROM Rt shoulder to =/> than AROM Lt shoulder    Time 6    Period Weeks    Status New    Target Date 04/27/21      PT LONG TERM GOAL #3   Title Patient to report improved strength and ROM allowing her to use Rt UE for normal functional activities/ADL's    Time 6    Period Weeks    Status New    Target Date 04/27/21      PT LONG TERM GOAL #4   Title Independent in HEP    Time 6    Period Weeks    Status New    Target Date 04/27/21                   Plan - 03/23/21 1155     Clinical Impression Statement Continued weakness Rt  shoulder. Patient demonstrates decreased strength with AROM Rt shoulder. Reviewed and progressing exercises in the clinic and for home.    Rehab Potential Good    PT Frequency 2x / week    PT Duration 6 weeks    PT Treatment/Interventions ADLs/Self Care Home Management;Aquatic Therapy;Cryotherapy;Functional mobility training;Therapeutic activities;Therapeutic exercise;Neuromuscular re-education;Patient/family education;Manual techniques;Passive range of motion;Dry needling;Taping    PT Next Visit Plan review HEP; progress with ROM and strengthening Rt shoulder; posterior shoulder girdle strengthening; manual work as indicated (no modalities due to active CA)    PT Home Exercise Plan [id number]    Consulted and Agree with Plan of Care Patient             Patient will benefit from skilled therapeutic intervention in order to improve the following deficits and impairments:     Visit Diagnosis: Shoulder weakness  Scapular dyskinesis  Other symptoms and signs involving the musculoskeletal system     Problem List Patient Active Problem List   Diagnosis Date Noted   Metastatic bone tumor ([facility]) 01/20/2021   Pulmonary emboli ([facility]) 01/01/2021   Compression fracture of T12 vertebra ([facility]) 01/01/2021   Lumbar radiculopathy 01/01/2021   Non-small cell carcinoma of lung, stage 4, right ([facility]) 11/30/2020   Encounter for antineoplastic chemotherapy 11/30/2020   Encounter for antineoplastic immunotherapy 11/30/2020   Cancer associated pain 11/30/2020   Malnutrition, calorie ([facility]) 11/30/2020   Polymyalgia rheumatica ([facility]) 08/12/2019   Allergic rhinitis 06/10/2015   Anxiety 06/10/2015   Benign essential hypertension 06/10/2015    Mackenize Delgadillo Nilda Simmer, PT, MPH  03/23/2021, 12:38 PM  [place], [place], [address] Phone: [phone]   Fax:  [phone]  Name: Kristi Jennings MRN: [id number] Date of Birth:  15-Sep-1945

## 2021-03-24 ENCOUNTER — Inpatient Hospital Stay: Payer: Medicare HMO | Attending: Internal Medicine | Admitting: Internal Medicine

## 2021-03-24 ENCOUNTER — Encounter: Payer: Self-pay | Admitting: Internal Medicine

## 2021-03-24 ENCOUNTER — Inpatient Hospital Stay: Payer: Medicare HMO

## 2021-03-24 ENCOUNTER — Inpatient Hospital Stay: Payer: Medicare HMO | Admitting: Nutrition

## 2021-03-24 VITALS — BP 149/76 | HR 72 | Temp 97.5°F | Resp 18 | Ht 65.0 in | Wt 90.5 lb

## 2021-03-24 DIAGNOSIS — C3411 Malignant neoplasm of upper lobe, right bronchus or lung: Secondary | ICD-10-CM | POA: Insufficient documentation

## 2021-03-24 DIAGNOSIS — Z5112 Encounter for antineoplastic immunotherapy: Secondary | ICD-10-CM

## 2021-03-24 DIAGNOSIS — C3491 Malignant neoplasm of unspecified part of right bronchus or lung: Secondary | ICD-10-CM

## 2021-03-24 DIAGNOSIS — Z5111 Encounter for antineoplastic chemotherapy: Secondary | ICD-10-CM

## 2021-03-24 DIAGNOSIS — C7951 Secondary malignant neoplasm of bone: Secondary | ICD-10-CM

## 2021-03-24 DIAGNOSIS — G893 Neoplasm related pain (acute) (chronic): Secondary | ICD-10-CM

## 2021-03-24 DIAGNOSIS — Z79899 Other long term (current) drug therapy: Secondary | ICD-10-CM | POA: Insufficient documentation

## 2021-03-24 LAB — CMP (CANCER CENTER ONLY)
ALT: 11 U/L (ref 0–44)
AST: 20 U/L (ref 15–41)
Albumin: 3.7 g/dL (ref 3.5–5.0)
Alkaline Phosphatase: 80 U/L (ref 38–126)
Anion gap: 9 (ref 5–15)
BUN: 7 mg/dL — ABNORMAL LOW (ref 8–23)
CO2: 27 mmol/L (ref 22–32)
Calcium: 8.7 mg/dL — ABNORMAL LOW (ref 8.9–10.3)
Chloride: 103 mmol/L (ref 98–111)
Creatinine: 0.6 mg/dL (ref 0.44–1.00)
GFR, Estimated: >60 mL/min (ref >60)
Glucose, Bld: 89 mg/dL (ref 70–99)
Potassium: 3.6 mmol/L (ref 3.5–5.1)
Sodium: 139 mmol/L (ref 135–145)
Total Bilirubin: 0.6 mg/dL (ref 0.3–1.2)
Total Protein: 6.6 g/dL (ref 6.5–8.1)

## 2021-03-24 LAB — CBC WITH DIFFERENTIAL (CANCER CENTER ONLY)
Abs Immature Granulocytes: 0.01 10*3/uL (ref 0.00–0.07)
Basophils Absolute: 0 10*3/uL (ref 0.0–0.1)
Basophils Relative: 1 %
Eosinophils Absolute: 0.2 10*3/uL (ref 0.0–0.5)
Eosinophils Relative: 6 %
HCT: 34.1 % — ABNORMAL LOW (ref 36.0–46.0)
Hemoglobin: 11.2 g/dL — ABNORMAL LOW (ref 12.0–15.0)
Immature Granulocytes: 0 %
Lymphocytes Relative: 30 %
Lymphs Abs: 1 10*3/uL (ref 0.7–4.0)
MCH: 32.1 pg (ref 26.0–34.0)
MCHC: 32.8 g/dL (ref 30.0–36.0)
MCV: 97.7 fL (ref 80.0–100.0)
Monocytes Absolute: 0.7 10*3/uL (ref 0.1–1.0)
Monocytes Relative: 20 %
Neutro Abs: 1.5 10*3/uL — ABNORMAL LOW (ref 1.7–7.7)
Neutrophils Relative %: 43 %
Platelet Count: 186 10*3/uL (ref 150–400)
RBC: 3.49 MIL/uL — ABNORMAL LOW (ref 3.87–5.11)
RDW: 18.6 % — ABNORMAL HIGH (ref 11.5–15.5)
WBC Count: 3.4 10*3/uL — ABNORMAL LOW (ref 4.0–10.5)
nRBC: 0 % (ref 0.0–0.2)

## 2021-03-24 LAB — TSH: TSH: 2.008 u[IU]/mL (ref 0.308–3.960)

## 2021-03-24 MED ORDER — SODIUM CHLORIDE 0.9 % IV SOLN
200.0000 mg | Freq: Once | INTRAVENOUS | Status: AC
  Administered 2021-03-24: 200 mg via INTRAVENOUS
  Filled 2021-03-24: qty 8

## 2021-03-24 MED ORDER — PROCHLORPERAZINE MALEATE 10 MG PO TABS
10.0000 mg | ORAL_TABLET | Freq: Once | ORAL | Status: DC
  Filled 2021-03-24: qty 1

## 2021-03-24 MED ORDER — SODIUM CHLORIDE 0.9 % IV SOLN
500.0000 mg/m2 | Freq: Once | INTRAVENOUS | Status: AC
  Administered 2021-03-24: 700 mg via INTRAVENOUS
  Filled 2021-03-24: qty 20

## 2021-03-24 MED ORDER — CYANOCOBALAMIN 1000 MCG/ML IJ SOLN
1000.0000 ug | Freq: Once | INTRAMUSCULAR | Status: DC
  Filled 2021-03-24: qty 1

## 2021-03-24 MED ORDER — SODIUM CHLORIDE 0.9 % IV SOLN: Freq: Once | INTRAVENOUS | Status: AC

## 2021-03-24 NOTE — Progress Notes (Signed)
[facility] [phone]   [phone]  OFFICE PROGRESS NOTE  [name], MD [address] [place] [address]  DIAGNOSIS:  Stage IV (T3, N2, M1 C) non-small cell lung cancer with unknown histologic subtype.  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  DETECTED ALTERATION(S) / BIOMARKER(S) % CFDNA OR AMPLIFICATION ASSOCIATED FDA-APPROVED THERAPIES CLINICAL TRIAL AVAILABILITY [id number] 8.9%  Sotorasib Yes [id number] 7.2% None  Yes  PRIOR THERAPY: Palliative radiotherapy to the painful bone lesions under the care of Dr. Sondra Come.  CURRENT THERAPY:  Palliative systemic chemotherapy with carboplatin for AUC of 5, Alimta 500 Mg/M2 and Keytruda 200 Mg IV every 3 weeks.  First dose of treatment on December 29, 2020.  Status post 4 cycles.  Starting from cycle #5 the patient will be on maintenance treatment with Alimta and Keytruda every 3 weeks.  INTERVAL HISTORY: Kristi Jennings 75 y.o. female returns to the clinic today for follow-up visit accompanied by her husband.  The patient is feeling fine today with no concerning complaints except for lack of appetite.  She is currently on Remeron.  She denied having any current chest pain, shortness of breath, cough or hemoptysis.  She denied having any fever or chills.  She has no nausea, vomiting, diarrhea or constipation.  She has been tolerating her treatment with chemotherapy fairly well except for fatigue.  She is here today for evaluation before starting cycle #5.   MEDICAL HISTORY: Past Medical History:  Diagnosis Date   Borderline diabetes mellitus    Bronchogenic cancer of right lung ([facility])    Dyslipidemia    Hypertension    Polymyalgia rheumatica ([facility])     ALLERGIES:  is allergic to shellfish-derived products and augmentin  [amoxicillin-pot clavulanate].  MEDICATIONS:  Current Outpatient Medications  Medication Sig Dispense Refill   acetaminophen (TYLENOL) 650 MG CR tablet Take 1,300 mg by mouth 3 (three) times daily as needed for pain.     aspirin EC 81 MG tablet Take 81 mg by mouth 4 (four) times a week. Swallow whole.     atenolol (TENORMIN) 25 MG tablet Take 12.5 mg by mouth at bedtime.     diclofenac Sodium (VOLTAREN) 1 % GEL Apply 4 g topically 4 (four) times daily. (Patient taking differently: Apply 4 g topically 4 (four) times daily as needed (pain).) 150 g 0   ELIQUIS 5 MG TABS tablet Take 5 mg by mouth 2 (two) times daily.     escitalopram (LEXAPRO) 20 MG tablet Take 20 mg by mouth at bedtime.     folic acid (FOLVITE) 1 MG tablet Take 1 tablet (1 mg total) by mouth daily. 30 tablet 2   HYDROmorphone (DILAUDID) 2 MG tablet 2-4 mg po q6hr for breakthrough pain (Patient not taking: Reported on 03/16/2021) 30 tablet 0   mirtazapine (REMERON) 15 MG tablet Take 1 tablet (15 mg total) by mouth at bedtime. 30 tablet 0   mirtazapine (REMERON) 15 MG tablet Take by mouth. (Patient not taking: Reported on 03/16/2021)     morphine (MS CONTIN) 15 MG 12 hr tablet Take 1 tablet (15 mg total) by mouth every 12 (twelve) hours. 60 tablet 0   ondansetron (ZOFRAN-ODT) 4 MG disintegrating tablet Take 4 mg by mouth 2 (two) times daily as needed for nausea or  vomiting. (Patient not taking: Reported on 03/16/2021)     prochlorperazine (COMPAZINE) 10 MG tablet Take 1 tablet (10 mg total) by mouth every 6 (six) hours as needed for nausea or vomiting. (Patient not taking: Reported on 03/16/2021) 30 tablet 0   rosuvastatin (CRESTOR) 10 MG tablet Take 10 mg by mouth at bedtime.     No current facility-administered medications for this visit.    SURGICAL HISTORY: No past surgical history on file.  REVIEW OF SYSTEMS:  A comprehensive review of systems was negative except for: Constitutional: positive for anorexia and fatigue    PHYSICAL EXAMINATION: General appearance: alert, cooperative, fatigued, and no distress Head: Normocephalic, without obvious abnormality, atraumatic Neck: no adenopathy, no JVD, supple, symmetrical, trachea midline, and thyroid not enlarged, symmetric, no tenderness/mass/nodules Lymph nodes: Cervical, supraclavicular, and axillary nodes normal. Resp: clear to auscultation bilaterally Back: symmetric, no curvature. ROM normal. No CVA tenderness. Cardio: regular rate and rhythm, S1, S2 normal, no murmur, click, rub or gallop GI: soft, non-tender; bowel sounds normal; no masses,  no organomegaly Extremities: extremities normal, atraumatic, no cyanosis or edema  ECOG PERFORMANCE STATUS: 1 - Symptomatic but completely ambulatory  Blood pressure (!) 149/76, pulse 72, temperature (!) 97.5 F (36.4 C), temperature source Tympanic, resp. rate 18, height _0  (1.651 m), weight 90 lb 8 oz (41.1 kg), SpO2 97 %.  LABORATORY DATA: Lab Results  Component Value Date   WBC 4.4 03/17/2021   HGB 11.0 (L) 03/17/2021   HCT 33.2 (L) 03/17/2021   MCV 95.4 03/17/2021   PLT 114 (L) 03/17/2021      Chemistry      Component Value Date/Time   NA 138 03/17/2021 0912   K 3.9 03/17/2021 0912   CL 104 03/17/2021 0912   CO2 26 03/17/2021 0912   BUN 5 (L) 03/17/2021 0912   CREATININE 0.63 03/17/2021 0912      Component Value Date/Time   CALCIUM 8.9 03/17/2021 0912   ALKPHOS 77 03/17/2021 0912   AST 22 03/17/2021 0912   ALT 16 03/17/2021 0912   BILITOT 0.6 03/17/2021 0912       RADIOGRAPHIC STUDIES: CT Chest W Contrast  Result Date: 03/02/2021 CLINICAL DATA:  75 year old female with history of non-small cell lung cancer status post radiation therapy and chemotherapy 2 weeks ago. EXAM: CT CHEST, ABDOMEN, AND PELVIS WITH CONTRAST TECHNIQUE: Multidetector CT imaging of the chest, abdomen and pelvis was performed following the standard protocol during bolus administration of intravenous contrast.  CONTRAST:  55m OMNIPAQUE IOHEXOL 350 MG/ML SOLN COMPARISON:  Chest CTA 01/01/2021. Outside CT of the chest, abdomen and pelvis 10/20/2020. FINDINGS: CT CHEST FINDINGS Cardiovascular: Heart size is normal. There is no significant pericardial fluid, thickening or pericardial calcification. There is aortic atherosclerosis, as well as atherosclerosis of the great vessels of the mediastinum and the coronary arteries, including calcified atherosclerotic plaque in the left main, left anterior descending, left circumflex and right coronary arteries. Ectasia of ascending thoracic aorta (4.2 cm in diameter). Mediastinum/Nodes: Previously noted enlarged right paratracheal lymph nodes have regressed and are no longer enlarged. No definite pathologically enlarged mediastinal or hilar lymph nodes are noted on today's examination. Esophagus is unremarkable in appearance. No axillary lymphadenopathy. Lungs/Pleura: Partial involution of partially cavitary right upper lobe mass which currently measures 5.4 x 3.5 x 6.0 cm (axial image 13 of series 2 and coronal image 53 of series 5), with persistent cavitation along the inferolateral margin of the lesion. Some mild postobstructive changes are noted in the  lung parenchyma surrounding this lesion. No other definite suspicious appearing pulmonary nodules or masses are noted. No new acute consolidative airspace disease. No pleural effusions. Musculoskeletal: Expansile lytic lesion in the anterolateral aspect of the right third rib measuring up to 2.1 x 5.5 cm, far more apparent than the prior examination. 1.7 cm lytic lesion in the left side of the upper sternum (axial image 26 of series 2) with associated pathologic fracture. 1.9 cm centrally lucent peripherally sclerotic lesion in the left humeral head (axial image 12 of series 2). New healing fracture of the posterolateral aspect of the left fourth rib (axial image 15 of series 2). Neck pathologic compression fractures of T8 and T12  both appear slightly more compressed than the prior study, with up to 70% loss of height anteriorly at T12 and 60% loss of height anteriorly at T8. Mild compression fracture of superior endplate of T4 with 11% loss of anterior vertebral body height, similar to the prior study. CT ABDOMEN PELVIS FINDINGS Hepatobiliary: Previously noted hypovascular lesion in segment 7 of the liver is decreased in size compared to the prior study from 01/01/2021, currently measuring 2.3 x 2.0 cm. There is a surrounding perfusion anomaly cephalad to this lesion in the right lobe of the liver. No other definite suspicious appearing hepatic lesions are noted. No intra or extrahepatic biliary ductal dilatation. Gallbladder is normal in appearance. Pancreas: No pancreatic mass. No pancreatic ductal dilatation. No pancreatic or peripancreatic fluid collections or inflammatory changes. Spleen: Unremarkable. Adrenals/Urinary Tract: Bilateral kidneys and left adrenal gland are normal in appearance. Right adrenal mass is smaller than the prior study, currently measuring approximately 2.5 x 1.7 cm. No hydroureteronephrosis. Urinary bladder is normal in appearance. Stomach/Bowel: Normal appearance of the stomach. No pathologic dilatation of small bowel or colon. Normal appendix. Vascular/Lymphatic: Aortic atherosclerosis, without evidence of aneurysm or dissection in the abdominal or pelvic vasculature. No definite lymphadenopathy noted in the abdomen or pelvis. Reproductive: Uterus and ovaries are atrophic. Other: No significant volume of ascites.  No pneumoperitoneum. Musculoskeletal: Widespread areas of lucency are noted in the proximal femurs bilaterally, suspicious for osseous metastases. IMPRESSION: 1. Today's study demonstrates some regression of the large partially cavitary right upper lobe lung mass, as well as regression of previously noted mediastinal lymphadenopathy and regression of the metastatic lesions in the liver and right  adrenal gland. Widespread metastatic disease to the bones is also noted on today's examination with multiple pathologic fractures, as detailed above. 2. Aortic atherosclerosis, in addition to left main and 3 vessel coronary artery disease. 3. Ectasia of ascending thoracic aorta (4.2 cm in diameter). Recommend annual imaging followup by CTA or MRA. This recommendation follows 2010 ACCF/AHA/AATS/ACR/ASA/SCA/SCAI/SIR/STS/SVM Guidelines for the Diagnosis and Management of Patients with Thoracic Aortic Disease. Circulation. 2010; 121: D552-C802. Aortic aneurysm NOS (ICD10-I71.9). 4. Additional incidental findings, as above. Electronically Signed   By: Vinnie Langton M.D.   On: 03/02/2021 20:57   CT Abdomen Pelvis W Contrast  Result Date: 03/02/2021 CLINICAL DATA:  75 year old female with history of non-small cell lung cancer status post radiation therapy and chemotherapy 2 weeks ago. EXAM: CT CHEST, ABDOMEN, AND PELVIS WITH CONTRAST TECHNIQUE: Multidetector CT imaging of the chest, abdomen and pelvis was performed following the standard protocol during bolus administration of intravenous contrast. CONTRAST:  24m OMNIPAQUE IOHEXOL 350 MG/ML SOLN COMPARISON:  Chest CTA 01/01/2021. Outside CT of the chest, abdomen and pelvis 10/20/2020. FINDINGS: CT CHEST FINDINGS Cardiovascular: Heart size is normal. There is no significant pericardial fluid, thickening or  pericardial calcification. There is aortic atherosclerosis, as well as atherosclerosis of the great vessels of the mediastinum and the coronary arteries, including calcified atherosclerotic plaque in the left main, left anterior descending, left circumflex and right coronary arteries. Ectasia of ascending thoracic aorta (4.2 cm in diameter). Mediastinum/Nodes: Previously noted enlarged right paratracheal lymph nodes have regressed and are no longer enlarged. No definite pathologically enlarged mediastinal or hilar lymph nodes are noted on today's examination.  Esophagus is unremarkable in appearance. No axillary lymphadenopathy. Lungs/Pleura: Partial involution of partially cavitary right upper lobe mass which currently measures 5.4 x 3.5 x 6.0 cm (axial image 13 of series 2 and coronal image 53 of series 5), with persistent cavitation along the inferolateral margin of the lesion. Some mild postobstructive changes are noted in the lung parenchyma surrounding this lesion. No other definite suspicious appearing pulmonary nodules or masses are noted. No new acute consolidative airspace disease. No pleural effusions. Musculoskeletal: Expansile lytic lesion in the anterolateral aspect of the right third rib measuring up to 2.1 x 5.5 cm, far more apparent than the prior examination. 1.7 cm lytic lesion in the left side of the upper sternum (axial image 26 of series 2) with associated pathologic fracture. 1.9 cm centrally lucent peripherally sclerotic lesion in the left humeral head (axial image 12 of series 2). New healing fracture of the posterolateral aspect of the left fourth rib (axial image 15 of series 2). Neck pathologic compression fractures of T8 and T12 both appear slightly more compressed than the prior study, with up to 70% loss of height anteriorly at T12 and 60% loss of height anteriorly at T8. Mild compression fracture of superior endplate of T4 with 88% loss of anterior vertebral body height, similar to the prior study. CT ABDOMEN PELVIS FINDINGS Hepatobiliary: Previously noted hypovascular lesion in segment 7 of the liver is decreased in size compared to the prior study from 01/01/2021, currently measuring 2.3 x 2.0 cm. There is a surrounding perfusion anomaly cephalad to this lesion in the right lobe of the liver. No other definite suspicious appearing hepatic lesions are noted. No intra or extrahepatic biliary ductal dilatation. Gallbladder is normal in appearance. Pancreas: No pancreatic mass. No pancreatic ductal dilatation. No pancreatic or peripancreatic  fluid collections or inflammatory changes. Spleen: Unremarkable. Adrenals/Urinary Tract: Bilateral kidneys and left adrenal gland are normal in appearance. Right adrenal mass is smaller than the prior study, currently measuring approximately 2.5 x 1.7 cm. No hydroureteronephrosis. Urinary bladder is normal in appearance. Stomach/Bowel: Normal appearance of the stomach. No pathologic dilatation of small bowel or colon. Normal appendix. Vascular/Lymphatic: Aortic atherosclerosis, without evidence of aneurysm or dissection in the abdominal or pelvic vasculature. No definite lymphadenopathy noted in the abdomen or pelvis. Reproductive: Uterus and ovaries are atrophic. Other: No significant volume of ascites.  No pneumoperitoneum. Musculoskeletal: Widespread areas of lucency are noted in the proximal femurs bilaterally, suspicious for osseous metastases. IMPRESSION: 1. Today's study demonstrates some regression of the large partially cavitary right upper lobe lung mass, as well as regression of previously noted mediastinal lymphadenopathy and regression of the metastatic lesions in the liver and right adrenal gland. Widespread metastatic disease to the bones is also noted on today's examination with multiple pathologic fractures, as detailed above. 2. Aortic atherosclerosis, in addition to left main and 3 vessel coronary artery disease. 3. Ectasia of ascending thoracic aorta (4.2 cm in diameter). Recommend annual imaging followup by CTA or MRA. This recommendation follows 2010 ACCF/AHA/AATS/ACR/ASA/SCA/SCAI/SIR/STS/SVM Guidelines for the Diagnosis and Management of  Patients with Thoracic Aortic Disease. Circulation. 2010; 121: P809-X833. Aortic aneurysm NOS (ICD10-I71.9). 4. Additional incidental findings, as above. Electronically Signed   By: Vinnie Langton M.D.   On: 03/02/2021 20:57    ASSESSMENT AND PLAN: This is a very pleasant 75 years old white female recently diagnosed with a stage IV (T3, N2, M1c) non-small  cell lung cancer with unknown histologic subtype questionable to be likely squamous cell carcinoma based on the morphology of the right upper lobe cavitary mass with mediastinal lymphadenopathy and metastatic disease to the bones as well as the right adrenal gland diagnosed in June 2022 but adenocarcinoma cannot be completely ruled out especially with the KRAS G12C mutation.  The patient has actionable KRAS G12C mutation which will be used as an option for treatment on the second line setting. The patient underwent palliative radiotherapy to multiple metastatic bone lesions under the care of Dr. Sondra Come.  The patient is currently undergoing systemic chemotherapy with carboplatin for AUC of 5, Alimta 500 Mg/M2 and Keytruda 200 Mg IV every 3 weeks status post 4 cycles. Starting from someStarting cycle #5 she will be on maintenance treatment with Alimta and Keytruda every 3 weeks. The patient has been tolerating this treatment well with no concerning adverse effect except for fatigue and lack of appetite. I recommended for her to proceed with cycle #5 today as planned. For pain management she will continue her current treatment with MS Contin 30 mg p.o. every 12 hours in addition to [place] for breakthrough pain as well as Tylenol and Voltaren gel. For the lack of appetite and weight loss, she is currently on Megace ES 625 mg p.o. daily.  The patient was advised about the risk of Deep venous thrombosis with this treatment. For the depression, she will call her gynecologist to increase the dose of Lexapro that was prescribed by her. The patient will come back for follow-up visit in 3 weeks for evaluation before the next cycle of her treatment. She was advised to call immediately if she has any other concerning symptoms in the interval.  The patient voices understanding of current disease status and treatment options and is in agreement with the current care plan.  All questions were answered. The patient knows  to call the clinic with any problems, questions or concerns. We can certainly see the patient much sooner if necessary.  Disclaimer: This note was dictated with voice recognition software. Similar sounding words can inadvertently be transcribed and may not be corrected upon review.

## 2021-03-24 NOTE — Patient Instructions (Signed)
[facility] ONCOLOGY  Discharge Instructions: Thank you for choosing [facility] to provide your oncology and hematology care.   If you have a lab appointment with the [facility], please go directly to the [facility] and check in at the registration area.   Wear comfortable clothing and clothing appropriate for easy access to any Portacath or PICC line.   We strive to give you quality time with your provider. You may need to reschedule your appointment if you arrive late (15 or more minutes).  Arriving late affects you and other patients whose appointments are after yours.  Also, if you miss three or more appointments without notifying the office, you may be dismissed from the clinic at the provider's discretion.      For prescription refill requests, have your pharmacy contact our office and allow 72 hours for refills to be completed.    Today you received the following chemotherapy and/or immunotherapy agents keytruda alimta      To help prevent nausea and vomiting after your treatment, we encourage you to take your nausea medication as directed.  BELOW ARE SYMPTOMS THAT SHOULD BE REPORTED IMMEDIATELY: *FEVER GREATER THAN 100.4 F (38 C) OR HIGHER *CHILLS OR SWEATING *NAUSEA AND VOMITING THAT IS NOT CONTROLLED WITH YOUR NAUSEA MEDICATION *UNUSUAL SHORTNESS OF BREATH *UNUSUAL BRUISING OR BLEEDING *URINARY PROBLEMS (pain or burning when urinating, or frequent urination) *BOWEL PROBLEMS (unusual diarrhea, constipation, pain near the anus) TENDERNESS IN MOUTH AND THROAT WITH OR WITHOUT PRESENCE OF ULCERS (sore throat, sores in mouth, or a toothache) UNUSUAL RASH, SWELLING OR PAIN  UNUSUAL VAGINAL DISCHARGE OR ITCHING   Items with * indicate a potential emergency and should be followed up as soon as possible or go to the Emergency Department if any problems should occur.  Please show the CHEMOTHERAPY ALERT CARD or IMMUNOTHERAPY ALERT CARD at  check-in to the Emergency Department and triage nurse.  Should you have questions after your visit or need to cancel or reschedule your appointment, please contact [facility]  Dept: [phone]  and follow the prompts.  Office hours are 8:00 a.m. to 4:30 p.m. Monday - Friday. Please note that voicemails left after 4:00 p.m. may not be returned until the following business day.  We are closed weekends and major holidays. You have access to a nurse at all times for urgent questions. Please call the main number to the clinic Dept: [phone] and follow the prompts.   For any non-urgent questions, you may also contact your provider using MyChart. We now offer e-Visits for anyone 79 and older to request care online for non-urgent symptoms. For details visit [email or web address].   Also download the MyChart app! Go to the app store, search "MyChart", open the app, select [facility], and log in with your MyChart username and password.  Due to Covid, a mask is required upon entering the hospital/clinic. If you do not have a mask, one will be given to you upon arrival. For doctor visits, patients may have 1 support person aged 67 or older with them. For treatment visits, patients cannot have anyone with them due to current Covid guidelines and our immunocompromised population.

## 2021-03-24 NOTE — Progress Notes (Signed)
Nutrition follow-up completed with patient during infusion for stage IV non-small cell lung cancer.  Patient is discouraged that her appetite has not improved.  She continues to take Remeron in hopes that will help her feel more hungry.  She has low energy.  Current weight documented is 90.5 pounds November 3 down from 91.9 pounds September 22. Patient denies other nutrition impact symptoms.  Nutrition diagnosis: Unintended weight loss and severe malnutrition continue.  Intervention: Encourage patient to increase overall calories in the food she is eating. Recommended limiting Premier protein twice daily and add high-calorie foods like peanut butter or ice cream. Encourage variety. Provided recipes and fact sheet on poor appetite.  Monitoring, evaluation, goals: Patient will tolerate adequate calories and protein to improve quality of life.  Next visit: To be scheduled as needed.  **Disclaimer: This note was dictated with voice recognition software. Similar sounding words can inadvertently be transcribed and this note may contain transcription errors which may not have been corrected upon publication of note.**

## 2021-03-28 ENCOUNTER — Other Ambulatory Visit: Payer: Self-pay | Admitting: Physician Assistant

## 2021-03-28 ENCOUNTER — Telehealth: Payer: Self-pay | Admitting: Internal Medicine

## 2021-03-28 DIAGNOSIS — C3491 Malignant neoplasm of unspecified part of right bronchus or lung: Secondary | ICD-10-CM

## 2021-03-28 NOTE — Telephone Encounter (Signed)
Left message with follow-up appointments per 11/3 los.

## 2021-03-29 DIAGNOSIS — G5681 Other specified mononeuropathies of right upper limb: Secondary | ICD-10-CM | POA: Diagnosis not present

## 2021-03-30 ENCOUNTER — Ambulatory Visit (INDEPENDENT_AMBULATORY_CARE_PROVIDER_SITE_OTHER): Payer: Medicare HMO | Admitting: Rehabilitative and Restorative Service Providers"

## 2021-03-30 ENCOUNTER — Other Ambulatory Visit: Payer: Self-pay

## 2021-03-30 ENCOUNTER — Encounter: Payer: Self-pay | Admitting: Rehabilitative and Restorative Service Providers"

## 2021-03-30 DIAGNOSIS — G2589 Other specified extrapyramidal and movement disorders: Secondary | ICD-10-CM | POA: Diagnosis not present

## 2021-03-30 DIAGNOSIS — R29898 Other symptoms and signs involving the musculoskeletal system: Secondary | ICD-10-CM | POA: Diagnosis not present

## 2021-03-30 NOTE — Therapy (Signed)
[facility] [place], [place], [address] Phone: [phone]   Fax:  [phone]  Physical Therapy Treatment  Patient Details  Name: Kristi Jennings MRN: [id number] Date of Birth: [birth year] Referring Provider (PT): Dr Vickki Hearing   Encounter Date: 03/30/2021   PT End of Session - 03/30/21 1153     Visit Number 3    Number of Visits 12    Date for PT Re-Evaluation 04/27/21    PT Start Time 1148    PT Stop Time 1233    PT Time Calculation (min) 45 min    Activity Tolerance Patient tolerated treatment well             Past Medical History:  Diagnosis Date   Borderline diabetes mellitus    Bronchogenic cancer of right lung ([facility])    Dyslipidemia    Hypertension    Polymyalgia rheumatica ([facility])     History reviewed. No pertinent surgical history.  There were no vitals filed for this visit.   Subjective Assessment - 03/30/21 1154     Subjective NCV showed that the nerves are working in the Rt UE. She has the pulley for home but has not used it often. Working on her exercises some.    Currently in Pain? No/denies    Pain Score 0-No pain    Pain Location Shoulder    Pain Orientation Right                [facility] PT Assessment - 03/30/21 0001       Assessment   Medical Diagnosis Rt shoulder dysfunction    Referring Provider (PT) Dr Vickki Hearing    Onset Date/Surgical Date 02/03/21    Hand Dominance Right    Next MD Visit none scheduled    Prior Therapy for LBP in years past      AROM   Right Shoulder Flexion 92 Degrees    Right Shoulder ABduction 65 Degrees      Strength   Right Shoulder Flexion 2-/5    Right Shoulder Extension 3+/5    Right Shoulder ABduction 2-/5    Right Shoulder Internal Rotation 4/5    Right Shoulder External Rotation 4/5    Right Shoulder Horizontal ABduction 4/5    Left Shoulder Flexion 4+/5    Left Shoulder Extension 5/5    Left Shoulder ABduction 4+/5     Left Shoulder Internal Rotation 4+/5    Left Shoulder External Rotation 4+/5    Left Shoulder Horizontal ABduction 5/5                           [facility] Adult PT Treatment/Exercise - 03/30/21 0001       Self-Care   Self-Care Other Self-Care Comments    Other Self-Care Comments  discussed positions for rest for home to avoid rounded back posture      Shoulder Exercises: Supine   Protraction Limitations punch to ceiling x 10    Horizontal ABduction Strengthening;Both;10 reps;Theraband    Theraband Level (Shoulder Horizontal ABduction) Level 1 (Yellow)    External Rotation Strengthening;Right;10 reps;Theraband    Theraband Level (Shoulder External Rotation) Level 1 (Yellow)    Flexion AROM;Right;10 reps    Flexion Limitations through partial range    Other Supine Exercises scap squeeze 10 sec x 5      Shoulder Exercises: Seated   Flexion AAROM;Strengthening;Both;10 reps    Flexion Limitations sitting with assist  of Lt UE      Shoulder Exercises: Standing   Flexion AAROM;Strengthening;Right;Left;5 reps    Flexion Limitations sliding bilat UE's up wall and walking down with fingertips touching wall      Shoulder Exercises: Pulleys   Flexion Limitations 10 sec hold x 10 reps assisted lowering last rep    Scaption Limitations 10 sec hold x 10 reps assisted lowering last rep      Shoulder Exercises: ROM/Strengthening   Wall Pushups 10 reps    Pushups --    Rhythmic Stabilization, Supine flex/ext; horiz ab/ad; circles CW/CCW x 10 each      Shoulder Exercises: Isometric Strengthening   Flexion Limitations standing yellow TB secured in door - holding band at side step forward for isometric flexion contraction 2-3 sec hold x 10 reps                          PT Long Term Goals - 03/16/21 1521       PT LONG TERM GOAL #1   Title Increase strength Rt shoulder to 4/5 to 5/5 throughout    Time 6    Period Weeks    Status New    Target Date 04/27/21       PT LONG TERM GOAL #2   Title Increase AROM Rt shoulder to =/> than AROM Lt shoulder    Time 6    Period Weeks    Status New    Target Date 04/27/21      PT LONG TERM GOAL #3   Title Patient to report improved strength and ROM allowing her to use Rt UE for normal functional activities/ADL's    Time 6    Period Weeks    Status New    Target Date 04/27/21      PT LONG TERM GOAL #4   Title Independent in HEP    Time 6    Period Weeks    Status New    Target Date 04/27/21                   Plan - 03/30/21 1157     Clinical Impression Statement Patient and husband report that she had a good report from Dr Nelva Bush on NCV and Dr Delilah Shan is pleased with her progress on Rt shoulder movement. Patient demonstrates increased active ROM Rt shoulder flexion; remains weak. Added light resistive and isometric exercises. Patient has very limited tolerance for exercises reporting and demonstrating fatigue quickly.    Rehab Potential Good    PT Frequency 2x / week    PT Duration 6 weeks    PT Treatment/Interventions ADLs/Self Care Home Management;Aquatic Therapy;Cryotherapy;Functional mobility training;Therapeutic activities;Therapeutic exercise;Neuromuscular re-education;Patient/family education;Manual techniques;Passive range of motion;Dry needling;Taping    PT Next Visit Plan review HEP; progress with ROM and strengthening Rt shoulder; posterior shoulder girdle strengthening; manual work as indicated (no modalities due to active CA)    PT Home Exercise Plan [id number]    Consulted and Agree with Plan of Care Patient             Patient will benefit from skilled therapeutic intervention in order to improve the following deficits and impairments:     Visit Diagnosis: Shoulder weakness  Scapular dyskinesis  Other symptoms and signs involving the musculoskeletal system     Problem List Patient Active Problem List   Diagnosis Date Noted   Metastatic bone tumor ([facility])  01/20/2021   Pulmonary emboli ([facility]) 01/01/2021  Compression fracture of T12 vertebra ([facility]) 01/01/2021   Lumbar radiculopathy 01/01/2021   Non-small cell carcinoma of lung, stage 4, right ([facility]) 11/30/2020   Encounter for antineoplastic chemotherapy 11/30/2020   Encounter for antineoplastic immunotherapy 11/30/2020   Cancer associated pain 11/30/2020   Malnutrition, calorie ([facility]) 11/30/2020   Polymyalgia rheumatica ([facility]) 08/12/2019   Allergic rhinitis 06/10/2015   Anxiety 06/10/2015   Benign essential hypertension 06/10/2015    Aubrina Nieman Nilda Simmer, PT, MPH  03/30/2021, 12:37 PM  [facility] [place], [place], [address] Phone: [phone]   Fax:  [phone]  Name: Kristi Jennings MRN: [id number] Date of Birth: [birth year]

## 2021-03-31 ENCOUNTER — Other Ambulatory Visit: Payer: Medicare HMO

## 2021-04-05 NOTE — Progress Notes (Signed)
[facility] OFFICE PROGRESS NOTE  [name], MD [address] [place] [address]  DIAGNOSIS: Stage IV (T3, N2, M1 C) non-small cell lung cancer with unknown histologic subtype.  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   DETECTED ALTERATION(S) / BIOMARKER(S)      % CFDNA OR AMPLIFICATION        ASSOCIATED FDA-APPROVED THERAPIES         CLINICAL TRIAL AVAILABILITY [id number] 8.9%   Sotorasib Yes [id number] 7.2% None     Yes  PRIOR THERAPY:  Palliative radiotherapy to the painful bone lesions under the care of Dr. Sondra Come.  CURRENT THERAPY: 1) Palliative systemic chemotherapy with carboplatin for AUC of 5, Alimta 500 Mg/M2 and Keytruda 200 Mg IV every 3 weeks.  First dose of treatment on December 29, 2020. Status post 5 cycles.  Starting from cycle #5, the patient is going to start maintenance Alimta and Keytruda. 2)Zometa every 6 weeks starting on 02/10/21    INTERVAL HISTORY: Kristi Jennings 75 y.o. female returns to the clinic today for a follow-up visit accompanied by her husband.  The patient is feeling fairly well today without any concerning complaints except for the last 7 days or so, she has had whole body muscle soreness. She denies recent exercise or strenuous activities. No tenderness to palpation of muscles or joints. No recent illness or sick contacts. Denies sore throat, nasal congestion, or traumas. She states her pain is "bearable" and rates it a 4/10. She takes 15 mg of MS contin daily and tylenol for break through pain. She started this dose about 6 weeks ago. They do not feel that increasing her dose back to 30 mg will help her pain. Of note, she does carry the diagnosis of polymyalgia rheumatica which she previously saw Dr. Trudie Reed. She was on a long tapering dose of prednisone and  completed this around the time of her cancer diagnosis.  Otherwise, she states she has no other after effects of chemotherapy except for fatigue.  She is presently on maintenance Alimta and Keytruda.  Regarding the weight loss and decreased appetite, the patient is followed by member the nutritionist team.  She was previously prescribed Megace but prior to starting this, the patient developed a small pulmonary embolism and this was discontinued.  Therefore, the patient is on Remeron 30 mg p.o. daily for appetite.  Her weight is stable at this time.  Denies any fever, chills, or night sweats.  The patient is currently undergoing physical therapy for shoulder pain.  She denies any chest pain, shortness of breath, cough, or hemoptysis.  She denies any nausea, vomiting, diarrhea, or constipation.  She uses senna to prevent constipation.  She receives Zometa for her metastatic bone lesions.  She denies any rashes or skin changes.  She denies any headache or visual changes.  The patient is here today for evaluation and repeat blood work before starting cycle #6.  MEDICAL HISTORY: Past Medical History:  Diagnosis Date   Borderline diabetes mellitus    Bronchogenic cancer of right lung ([facility])    Dyslipidemia    Hypertension    Polymyalgia rheumatica ([facility])     ALLERGIES:  is allergic to shellfish-derived products and augmentin [amoxicillin-pot clavulanate].  MEDICATIONS:  Current Outpatient Medications  Medication Sig Dispense Refill   acetaminophen (TYLENOL)  650 MG CR tablet Take 1,300 mg by mouth 3 (three) times daily as needed for pain.     aspirin EC 81 MG tablet Take 81 mg by mouth 4 (four) times a week. Swallow whole.     atenolol (TENORMIN) 25 MG tablet Take 12.5 mg by mouth at bedtime.     diclofenac Sodium (VOLTAREN) 1 % GEL Apply 4 g topically 4 (four) times daily. (Patient taking differently: Apply 4 g topically 4 (four) times daily as needed (pain).) 150 g 0   ELIQUIS 5 MG TABS tablet Take 5  mg by mouth 2 (two) times daily.     escitalopram (LEXAPRO) 20 MG tablet Take 20 mg by mouth at bedtime.     folic acid (FOLVITE) 1 MG tablet TAKE ONE TABLET BY MOUTH ONE TIME DAILY 30 tablet 2   losartan (COZAAR) 50 MG tablet Take 50 mg by mouth daily.     mirtazapine (REMERON) 15 MG tablet Take by mouth.     morphine (MS CONTIN) 15 MG 12 hr tablet Take 1 tablet (15 mg total) by mouth every 12 (twelve) hours. 60 tablet 0   rosuvastatin (CRESTOR) 10 MG tablet Take 10 mg by mouth at bedtime.     HYDROmorphone (DILAUDID) 2 MG tablet 2-4 mg po q6hr for breakthrough pain (Patient not taking: Reported on 03/16/2021) 30 tablet 0   ondansetron (ZOFRAN-ODT) 4 MG disintegrating tablet Take 4 mg by mouth 2 (two) times daily as needed for nausea or vomiting. (Patient not taking: Reported on 03/16/2021)     prochlorperazine (COMPAZINE) 10 MG tablet Take 1 tablet (10 mg total) by mouth every 6 (six) hours as needed for nausea or vomiting. (Patient not taking: Reported on 03/16/2021) 30 tablet 0   No current facility-administered medications for this visit.    SURGICAL HISTORY: No past surgical history on file.  REVIEW OF SYSTEMS:   Review of Systems  Constitutional: Positive for up and down appetite and fatigue following treatment. Negative for chills, fever and unexpected weight change.  HENT: Negative for mouth sores, nosebleeds, sore throat and trouble swallowing.   Eyes: Negative for eye problems and icterus.  Respiratory: Negative for cough, hemoptysis, shortness of breath and wheezing.   Cardiovascular: Negative for chest pain and leg swelling.  Gastrointestinal: Negative for abdominal pain, constipation, diarrhea, nausea and vomiting.  Genitourinary: Negative for bladder incontinence, difficulty urinating, dysuria, frequency and hematuria.   Musculoskeletal: Positive for diffuse and generalized muscle aches.  Negative for gait problem, neck pain and neck stiffness.  Skin: Negative for itching and  rash.  Neurological: Negative for dizziness, extremity weakness, gait problem, headaches, light-headedness and seizures.  Hematological: Negative for adenopathy. Does not bruise/bleed easily.  Psychiatric/Behavioral: Negative for confusion, depression and sleep disturbance. The patient is not nervous/anxious.     PHYSICAL EXAMINATION:  Blood pressure 126/68, pulse 69, temperature 98.7 F (37.1 C), temperature source Tympanic, resp. rate 18, weight 91 lb 2 oz (41.3 kg), SpO2 98 %.  ECOG PERFORMANCE STATUS: 1  Physical Exam  Constitutional: Oriented to person, place, and time and cachetic appearing female, and in no distress.  HENT:  Head: Normocephalic and atraumatic.  Mouth/Throat: Oropharynx is clear and moist. No oropharyngeal exudate.  Eyes: Conjunctivae are normal. Right eye exhibits no discharge. Left eye exhibits no discharge. No scleral icterus.  Neck: Normal range of motion. Neck supple.  Cardiovascular: Normal rate, regular rhythm, normal heart sounds and intact distal pulses.   Pulmonary/Chest: Effort normal and breath sounds normal. No  respiratory distress. No wheezes. No rales.  Abdominal: Soft. Bowel sounds are normal. Exhibits no distension and no mass. There is no tenderness.  Musculoskeletal: Normal range of motion. Exhibits no edema.  Lymphadenopathy:    No cervical adenopathy.  Neurological: Alert and oriented to person, place, and time. Exhibits muscle wasting. Gait normal. Coordination normal.  Skin: Skin is warm and dry. No rash noted. Not diaphoretic. No erythema. No pallor.  Psychiatric: Mood, memory and judgment normal.  Vitals reviewed.  LABORATORY DATA: Lab Results  Component Value Date   WBC 5.7 04/13/2021   HGB 11.0 (L) 04/13/2021   HCT 33.3 (L) 04/13/2021   MCV 97.1 04/13/2021   PLT 311 04/13/2021      Chemistry      Component Value Date/Time   NA 135 04/13/2021 0927   K 3.9 04/13/2021 0927   CL 100 04/13/2021 0927   CO2 25 04/13/2021 0927    BUN 6 (L) 04/13/2021 0927   CREATININE 0.62 04/13/2021 0927      Component Value Date/Time   CALCIUM 8.9 04/13/2021 0927   ALKPHOS 65 04/13/2021 0927   AST 20 04/13/2021 0927   ALT 9 04/13/2021 0927   BILITOT 0.4 04/13/2021 0927       RADIOGRAPHIC STUDIES:  No results found.   ASSESSMENT/PLAN:  This is a very pleasant 75 year old Caucasian female diagnosed with stage IV (T3, N2, M1 C) non-small cell lung cancer with unknown histologic subtype questionably likely to be squamous cell carcinoma based on the morphology of the right upper lobe cavitary mass with mediastinal lymphadenopathy metastatic disease to the bones as well as the right adrenal gland.  She was diagnosed in June 2022.  Adenocarcinoma cannot be completely ruled out especially with the K-ras G 12 C mutation.  Since the patient has an actionable mutation with K-ras G 12 C, this can be used as an option in the second line setting.  The patient completed palliative radiotherapy to multiple metastatic bone lesions under the care of Dr. Sondra Come.  The patient is currently undergoing systemic chemotherapy with carboplatin AUC 5, Alimta 500 mg per metered squared, Keytruda 200 mg IV every 3 weeks.  She is status post 5 cycles.  Starting from cycle #5, the patient will be on maintenance Alimta 500 mg per metered squared and Keytruda 200 mg.  I reviewed her diffuse muscle soreness with Dr. Julien Nordmann and her immunotherapy use. I also reviewed her history of PMR. Dr. Julien Nordmann recommends continuing with immunotherapy with [facility] and chemotherapy with Alimta and close monitoring with her rheumatologist for evaluation and recommendations. I reviewed this with the patient and her husband and she will reach out to their office. I asked the patient and her husband to inform us if rheumatology recommends prednisone. They will continue with her same dose of morphine as they do not feel increasing the dose will help her pain. She states her pain is  bearable.    Labs reviewed.  Recommend that she proceed with cycle #6 today scheduled.  We will see her back for follow-up visit in 3 weeks for evaluation before starting cycle #7.  I will arrange for restaging CT scan prior to starting her next cycle of treatment.  She will continue taking Zometa every 6 weeks for metastatic bone lesions.  She will continue taking Remeron 30 mg p.o. daily for her decreased appetite.  She is going to get her COVID-19 booster vaccine on one of her off weeks of treatment before her next cycle of  treatment.   The patient was advised to call immediately if she has any concerning symptoms in the interval. The patient voices understanding of current disease status and treatment options and is in agreement with the current care plan. All questions were answered. The patient knows to call the clinic with any problems, questions or concerns. We can certainly see the patient much sooner if necessary       Orders Placed This Encounter  Procedures   CT Chest W Contrast    Standing Status:   Future    Standing Expiration Date:   04/13/2022    Order Specific Question:   If indicated for the ordered procedure, I authorize the administration of contrast media per Radiology protocol    Answer:   Yes    Order Specific Question:   Preferred imaging location?    Answer[facility]   CT Abdomen Pelvis W Contrast    Standing Status:   Future    Standing Expiration Date:   04/13/2022    Order Specific Question:   If indicated for the ordered procedure, I authorize the administration of contrast media per Radiology protocol    Answer:   Yes    Order Specific Question:   Preferred imaging location?    Answer:   [facility]    Order Specific Question:   Is Oral Contrast requested for this exam?    Answer:   Yes, Per Radiology protocol      The total time spent in the appointment was 20-29 minutes.   Kristi Towner L Nathyn Luiz,  PA-C 04/13/21

## 2021-04-07 ENCOUNTER — Other Ambulatory Visit: Payer: Medicare HMO

## 2021-04-08 DIAGNOSIS — C3491 Malignant neoplasm of unspecified part of right bronchus or lung: Secondary | ICD-10-CM | POA: Diagnosis not present

## 2021-04-12 ENCOUNTER — Other Ambulatory Visit: Payer: Self-pay

## 2021-04-12 DIAGNOSIS — C3491 Malignant neoplasm of unspecified part of right bronchus or lung: Secondary | ICD-10-CM

## 2021-04-13 ENCOUNTER — Inpatient Hospital Stay: Payer: Medicare HMO

## 2021-04-13 ENCOUNTER — Encounter: Payer: Self-pay | Admitting: Physician Assistant

## 2021-04-13 ENCOUNTER — Inpatient Hospital Stay (HOSPITAL_BASED_OUTPATIENT_CLINIC_OR_DEPARTMENT_OTHER): Payer: Medicare HMO | Admitting: Physician Assistant

## 2021-04-13 ENCOUNTER — Other Ambulatory Visit: Payer: Self-pay

## 2021-04-13 VITALS — BP 126/68 | HR 69 | Temp 98.7°F | Resp 18 | Wt 91.1 lb

## 2021-04-13 DIAGNOSIS — Z79899 Other long term (current) drug therapy: Secondary | ICD-10-CM | POA: Diagnosis not present

## 2021-04-13 DIAGNOSIS — C3411 Malignant neoplasm of upper lobe, right bronchus or lung: Secondary | ICD-10-CM | POA: Diagnosis not present

## 2021-04-13 DIAGNOSIS — C3491 Malignant neoplasm of unspecified part of right bronchus or lung: Secondary | ICD-10-CM

## 2021-04-13 DIAGNOSIS — M791 Myalgia, unspecified site: Secondary | ICD-10-CM | POA: Diagnosis not present

## 2021-04-13 DIAGNOSIS — Z5111 Encounter for antineoplastic chemotherapy: Secondary | ICD-10-CM | POA: Diagnosis not present

## 2021-04-13 DIAGNOSIS — C7951 Secondary malignant neoplasm of bone: Secondary | ICD-10-CM

## 2021-04-13 DIAGNOSIS — Z5112 Encounter for antineoplastic immunotherapy: Secondary | ICD-10-CM | POA: Diagnosis not present

## 2021-04-13 LAB — CBC WITH DIFFERENTIAL (CANCER CENTER ONLY)
Abs Immature Granulocytes: 0.01 10*3/uL (ref 0.00–0.07)
Basophils Absolute: 0 10*3/uL (ref 0.0–0.1)
Basophils Relative: 0 %
Eosinophils Absolute: 0.2 10*3/uL (ref 0.0–0.5)
Eosinophils Relative: 4 %
HCT: 33.3 % — ABNORMAL LOW (ref 36.0–46.0)
Hemoglobin: 11 g/dL — ABNORMAL LOW (ref 12.0–15.0)
Immature Granulocytes: 0 %
Lymphocytes Relative: 17 %
Lymphs Abs: 1 10*3/uL (ref 0.7–4.0)
MCH: 32.1 pg (ref 26.0–34.0)
MCHC: 33 g/dL (ref 30.0–36.0)
MCV: 97.1 fL (ref 80.0–100.0)
Monocytes Absolute: 0.9 10*3/uL (ref 0.1–1.0)
Monocytes Relative: 15 %
Neutro Abs: 3.6 10*3/uL (ref 1.7–7.7)
Neutrophils Relative %: 64 %
Platelet Count: 311 10*3/uL (ref 150–400)
RBC: 3.43 MIL/uL — ABNORMAL LOW (ref 3.87–5.11)
RDW: 15 % (ref 11.5–15.5)
WBC Count: 5.7 10*3/uL (ref 4.0–10.5)
nRBC: 0 % (ref 0.0–0.2)

## 2021-04-13 LAB — CMP (CANCER CENTER ONLY)
ALT: 9 U/L (ref 0–44)
AST: 20 U/L (ref 15–41)
Albumin: 3.6 g/dL (ref 3.5–5.0)
Alkaline Phosphatase: 65 U/L (ref 38–126)
Anion gap: 10 (ref 5–15)
BUN: 6 mg/dL — ABNORMAL LOW (ref 8–23)
CO2: 25 mmol/L (ref 22–32)
Calcium: 8.9 mg/dL (ref 8.9–10.3)
Chloride: 100 mmol/L (ref 98–111)
Creatinine: 0.62 mg/dL (ref 0.44–1.00)
GFR, Estimated: >60 mL/min (ref >60)
Glucose, Bld: 112 mg/dL — ABNORMAL HIGH (ref 70–99)
Potassium: 3.9 mmol/L (ref 3.5–5.1)
Sodium: 135 mmol/L (ref 135–145)
Total Bilirubin: 0.4 mg/dL (ref 0.3–1.2)
Total Protein: 6.5 g/dL (ref 6.5–8.1)

## 2021-04-13 LAB — TSH: TSH: 1.746 u[IU]/mL (ref 0.308–3.960)

## 2021-04-13 MED ORDER — ZOLEDRONIC ACID 4 MG/5ML IV CONC
3.5000 mg | Freq: Once | INTRAVENOUS | Status: AC
  Administered 2021-04-13: 3.5 mg via INTRAVENOUS
  Filled 2021-04-13: qty 4.38

## 2021-04-13 MED ORDER — SODIUM CHLORIDE 0.9 % IV SOLN: Freq: Once | INTRAVENOUS | Status: AC

## 2021-04-13 MED ORDER — SODIUM CHLORIDE 0.9 % IV SOLN
200.0000 mg | Freq: Once | INTRAVENOUS | Status: AC
  Administered 2021-04-13: 200 mg via INTRAVENOUS
  Filled 2021-04-13: qty 8

## 2021-04-13 MED ORDER — CYANOCOBALAMIN 1000 MCG/ML IJ SOLN
1000.0000 ug | Freq: Once | INTRAMUSCULAR | Status: DC
  Filled 2021-04-13: qty 1

## 2021-04-13 MED ORDER — SODIUM CHLORIDE 0.9 % IV SOLN
500.0000 mg/m2 | Freq: Once | INTRAVENOUS | Status: AC
  Administered 2021-04-13: 700 mg via INTRAVENOUS
  Filled 2021-04-13: qty 20

## 2021-04-13 NOTE — Patient Instructions (Signed)
[facility]  Discharge Instructions: Thank you for choosing [facility] to provide your oncology and hematology care.   If you have a lab appointment with the [facility], please go directly to the [facility] and check in at the registration area.   Wear comfortable clothing and clothing appropriate for easy access to any Portacath or PICC line.   We strive to give you quality time with your provider. You may need to reschedule your appointment if you arrive late (15 or more minutes).  Arriving late affects you and other patients whose appointments are after yours.  Also, if you miss three or more appointments without notifying the office, you may be dismissed from the clinic at the provider's discretion.      For prescription refill requests, have your pharmacy contact our office and allow 72 hours for refills to be completed.    Today you received the following chemotherapy and/or immunotherapy agents, Keytruda, alimta, zometa.    To help prevent nausea and vomiting after your treatment, we encourage you to take your nausea medication as directed.  BELOW ARE SYMPTOMS THAT SHOULD BE REPORTED IMMEDIATELY: *FEVER GREATER THAN 100.4 F (38 C) OR HIGHER *CHILLS OR SWEATING *NAUSEA AND VOMITING THAT IS NOT CONTROLLED WITH YOUR NAUSEA MEDICATION *UNUSUAL SHORTNESS OF BREATH *UNUSUAL BRUISING OR BLEEDING *URINARY PROBLEMS (pain or burning when urinating, or frequent urination) *BOWEL PROBLEMS (unusual diarrhea, constipation, pain near the anus) TENDERNESS IN MOUTH AND THROAT WITH OR WITHOUT PRESENCE OF ULCERS (sore throat, sores in mouth, or a toothache) UNUSUAL RASH, SWELLING OR PAIN  UNUSUAL VAGINAL DISCHARGE OR ITCHING   Items with * indicate a potential emergency and should be followed up as soon as possible or go to the Emergency Department if any problems should occur.  Please show the CHEMOTHERAPY ALERT CARD or IMMUNOTHERAPY ALERT CARD  at check-in to the Emergency Department and triage nurse.  Should you have questions after your visit or need to cancel or reschedule your appointment, please contact [facility]  Dept: [phone]  and follow the prompts.  Office hours are 8:00 a.m. to 4:30 p.m. Monday - Friday. Please note that voicemails left after 4:00 p.m. may not be returned until the following business day.  We are closed weekends and major holidays. You have access to a nurse at all times for urgent questions. Please call the main number to the clinic Dept: [phone] and follow the prompts.   For any non-urgent questions, you may also contact your provider using MyChart. We now offer e-Visits for anyone 83 and older to request care online for non-urgent symptoms. For details visit [email or web address].   Also download the MyChart app! Go to the app store, search "MyChart", open the app, select , and log in with your MyChart username and password.  Due to Covid, a mask is required upon entering the hospital/clinic. If you do not have a mask, one will be given to you upon arrival. For doctor visits, patients may have 1 support person aged 60 or older with them. For treatment visits, patients cannot have anyone with them due to current Covid guidelines and our immunocompromised population.

## 2021-04-14 DIAGNOSIS — C3491 Malignant neoplasm of unspecified part of right bronchus or lung: Secondary | ICD-10-CM | POA: Diagnosis not present

## 2021-04-19 DIAGNOSIS — M2550 Pain in unspecified joint: Secondary | ICD-10-CM | POA: Diagnosis not present

## 2021-04-19 DIAGNOSIS — M154 Erosive (osteo)arthritis: Secondary | ICD-10-CM | POA: Diagnosis not present

## 2021-04-19 DIAGNOSIS — M791 Myalgia, unspecified site: Secondary | ICD-10-CM | POA: Diagnosis not present

## 2021-04-19 DIAGNOSIS — Z681 Body mass index (BMI) 19 or less, adult: Secondary | ICD-10-CM | POA: Diagnosis not present

## 2021-04-19 DIAGNOSIS — M353 Polymyalgia rheumatica: Secondary | ICD-10-CM | POA: Diagnosis not present

## 2021-04-19 DIAGNOSIS — R768 Other specified abnormal immunological findings in serum: Secondary | ICD-10-CM | POA: Diagnosis not present

## 2021-04-19 DIAGNOSIS — M150 Primary generalized (osteo)arthritis: Secondary | ICD-10-CM | POA: Diagnosis not present

## 2021-04-20 ENCOUNTER — Telehealth: Payer: Self-pay | Admitting: Medical Oncology

## 2021-04-20 ENCOUNTER — Other Ambulatory Visit: Payer: Self-pay | Admitting: Internal Medicine

## 2021-04-20 DIAGNOSIS — C7951 Secondary malignant neoplasm of bone: Secondary | ICD-10-CM

## 2021-04-20 DIAGNOSIS — C3490 Malignant neoplasm of unspecified part of unspecified bronchus or lung: Secondary | ICD-10-CM | POA: Diagnosis not present

## 2021-04-20 MED ORDER — MORPHINE SULFATE ER 15 MG PO TBCR: 15.0000 mg | EXTENDED_RELEASE_TABLET | Freq: Two times a day (BID) | ORAL | 0 refills | Status: DC

## 2021-04-20 NOTE — Telephone Encounter (Signed)
Refill request for MS Contin 15mg . Send to [facility]. Two tabs left.

## 2021-04-21 ENCOUNTER — Telehealth: Payer: Self-pay

## 2021-04-21 ENCOUNTER — Other Ambulatory Visit: Payer: Medicare HMO

## 2021-04-21 NOTE — Telephone Encounter (Signed)
Patient notified of Prior Authorization approval for Morphine Sulfate 15mg  Tablets. Medication is authorized through 04/20/2022. No other needs voiced at this time.

## 2021-04-26 ENCOUNTER — Telehealth: Payer: Self-pay

## 2021-04-26 ENCOUNTER — Other Ambulatory Visit: Payer: Self-pay | Admitting: Physician Assistant

## 2021-04-26 DIAGNOSIS — M791 Myalgia, unspecified site: Secondary | ICD-10-CM

## 2021-04-26 NOTE — Telephone Encounter (Signed)
Call received from Will [place] with [facility] via pts insurance carrier. He states the pt c/o severe muscle pain all over.  I have spoken with pt and her husband and she confirms her pain is much worse (8/10) than it was two weeks ago at her last visit. Pt states her pain is 8/10, constant and she is hurting all over her body. Pt states she has been taking her pain medication has prescribed. It was realized during the conversation that pt has been taking 2mg  of Dilaudid q6h but can take up to 4mg  q6h. Pt states she will try the 4mg  of Dilaudid over night to see if it helps and provide an update of how she is doing tomorrow about mid-morning.

## 2021-04-27 ENCOUNTER — Other Ambulatory Visit: Payer: Self-pay

## 2021-04-27 DIAGNOSIS — M791 Myalgia, unspecified site: Secondary | ICD-10-CM

## 2021-04-27 DIAGNOSIS — C3491 Malignant neoplasm of unspecified part of right bronchus or lung: Secondary | ICD-10-CM

## 2021-04-27 DIAGNOSIS — C7951 Secondary malignant neoplasm of bone: Secondary | ICD-10-CM

## 2021-04-28 ENCOUNTER — Other Ambulatory Visit: Payer: Medicare HMO

## 2021-04-29 ENCOUNTER — Other Ambulatory Visit: Payer: Self-pay

## 2021-04-29 ENCOUNTER — Inpatient Hospital Stay: Payer: Medicare HMO | Attending: Internal Medicine | Admitting: Nurse Practitioner

## 2021-04-29 ENCOUNTER — Ambulatory Visit (HOSPITAL_COMMUNITY)
Admission: RE | Admit: 2021-04-29 | Discharge: 2021-04-29 | Disposition: A | Discharge status: Home / Self Care | Payer: Medicare HMO | Source: Ambulatory Visit | Attending: Physician Assistant | Admitting: Physician Assistant

## 2021-04-29 VITALS — BP 162/80 | HR 76 | Temp 97.9°F | Resp 17 | Ht 65.0 in

## 2021-04-29 DIAGNOSIS — G893 Neoplasm related pain (acute) (chronic): Secondary | ICD-10-CM | POA: Diagnosis not present

## 2021-04-29 DIAGNOSIS — K5900 Constipation, unspecified: Secondary | ICD-10-CM | POA: Insufficient documentation

## 2021-04-29 DIAGNOSIS — I700 Atherosclerosis of aorta: Secondary | ICD-10-CM | POA: Diagnosis not present

## 2021-04-29 DIAGNOSIS — Z515 Encounter for palliative care: Secondary | ICD-10-CM | POA: Diagnosis not present

## 2021-04-29 DIAGNOSIS — Z79899 Other long term (current) drug therapy: Secondary | ICD-10-CM | POA: Insufficient documentation

## 2021-04-29 DIAGNOSIS — R590 Localized enlarged lymph nodes: Secondary | ICD-10-CM | POA: Insufficient documentation

## 2021-04-29 DIAGNOSIS — C3491 Malignant neoplasm of unspecified part of right bronchus or lung: Secondary | ICD-10-CM | POA: Diagnosis not present

## 2021-04-29 DIAGNOSIS — N858 Other specified noninflammatory disorders of uterus: Secondary | ICD-10-CM | POA: Diagnosis not present

## 2021-04-29 DIAGNOSIS — Z7189 Other specified counseling: Secondary | ICD-10-CM | POA: Diagnosis not present

## 2021-04-29 DIAGNOSIS — I10 Essential (primary) hypertension: Secondary | ICD-10-CM | POA: Insufficient documentation

## 2021-04-29 DIAGNOSIS — C3411 Malignant neoplasm of upper lobe, right bronchus or lung: Secondary | ICD-10-CM | POA: Insufficient documentation

## 2021-04-29 DIAGNOSIS — C7971 Secondary malignant neoplasm of right adrenal gland: Secondary | ICD-10-CM | POA: Insufficient documentation

## 2021-04-29 DIAGNOSIS — R530 Neoplastic (malignant) related fatigue: Secondary | ICD-10-CM

## 2021-04-29 DIAGNOSIS — I2510 Atherosclerotic heart disease of native coronary artery without angina pectoris: Secondary | ICD-10-CM | POA: Diagnosis not present

## 2021-04-29 DIAGNOSIS — Z87891 Personal history of nicotine dependence: Secondary | ICD-10-CM | POA: Insufficient documentation

## 2021-04-29 DIAGNOSIS — C7951 Secondary malignant neoplasm of bone: Secondary | ICD-10-CM | POA: Insufficient documentation

## 2021-04-29 DIAGNOSIS — R918 Other nonspecific abnormal finding of lung field: Secondary | ICD-10-CM | POA: Diagnosis not present

## 2021-04-29 DIAGNOSIS — Z5111 Encounter for antineoplastic chemotherapy: Secondary | ICD-10-CM | POA: Insufficient documentation

## 2021-04-29 DIAGNOSIS — R531 Weakness: Secondary | ICD-10-CM

## 2021-04-29 DIAGNOSIS — R600 Localized edema: Secondary | ICD-10-CM | POA: Insufficient documentation

## 2021-04-29 DIAGNOSIS — R911 Solitary pulmonary nodule: Secondary | ICD-10-CM | POA: Diagnosis not present

## 2021-04-29 MED ORDER — HYDROMORPHONE HCL 2 MG PO TABS: 2.0000 mg | ORAL_TABLET | Freq: Four times a day (QID) | ORAL | 0 refills | Status: DC | PRN

## 2021-04-29 MED ORDER — SODIUM CHLORIDE (PF) 0.9 % IJ SOLN
INTRAMUSCULAR | Status: AC
  Filled 2021-04-29: qty 50

## 2021-04-29 MED ORDER — DRONABINOL 2.5 MG PO CAPS: 2.5000 mg | ORAL_CAPSULE | Freq: Two times a day (BID) | ORAL | 0 refills | Status: DC

## 2021-04-29 MED ORDER — IOHEXOL 350 MG/ML SOLN
100.0000 mL | Freq: Once | INTRAVENOUS | Status: AC | PRN
  Administered 2021-04-29: 60 mL via INTRAVENOUS

## 2021-04-29 NOTE — Progress Notes (Signed)
[facility]  [phone] [phone]   Name: Kristi Jennings Date: 04/29/2021 MRN: [id number]  DOB: 07/17/1945  Patient Care Team: Kristi Jennings., MD as PCP - General (Internal Medicine) [facility] Registered Nurse [facility])    REASON FOR CONSULTATION: Kristi Jennings is a 75 y.o. 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 Palliative ask to see for symptom management.   SOCIAL HISTORY:     reports that she quit smoking about 12 years ago. Her smoking use included cigarettes. She has a 43.00 pack-year smoking history. She has never used smokeless tobacco. She reports that she does not currently use alcohol. She reports that she does not use drugs.  ADVANCE DIRECTIVES:  None on file   CODE STATUS:   PAST MEDICAL HISTORY: Past Medical History:  Diagnosis Date   Borderline diabetes mellitus    Bronchogenic cancer of right lung (HCC)    Dyslipidemia    Hypertension    Polymyalgia rheumatica ([place])     PAST SURGICAL HISTORY: No past surgical history on file.  HEMATOLOGY/ONCOLOGY HISTORY:  Oncology History  Non-small cell carcinoma of lung, stage 4, right ([place])  11/30/2020 Initial Diagnosis   Non-small cell carcinoma of lung, stage 4, right ([facility])   11/30/2020 Cancer Staging   Staging form: Lung, AJCC 8th Edition - Clinical: Stage IVB (cT3, cN2, cM1c) - Signed by Kristi Bears, MD on 11/30/2020    12/29/2020 -  Chemotherapy   Patient is on Treatment Plan : LUNG CARBOplatin / Pemetrexed / Pembrolizumab q21d Induction x 4 cycles / Maintenance Pemetrexed + Pembrolizumab       ALLERGIES:  is allergic to shellfish-derived products and augmentin [amoxicillin-pot clavulanate].  MEDICATIONS:  Current Outpatient Medications  Medication Sig Dispense Refill   dronabinol  (MARINOL) 2.5 MG capsule Take 1 capsule (2.5 mg total) by mouth 2 (two) times daily before a meal. 60 capsule 0   acetaminophen (TYLENOL) 650 MG CR tablet Take 1,300 mg by mouth 3 (three) times daily as needed for pain.     aspirin EC 81 MG tablet Take 81 mg by mouth 4 (four) times a week. Swallow whole.     atenolol (TENORMIN) 25 MG tablet Take 12.5 mg by mouth at bedtime.     diclofenac Sodium (VOLTAREN) 1 % GEL Apply 4 g topically 4 (four) times daily. (Patient taking differently: Apply 4 g topically 4 (four) times daily as needed (pain).) 150 g 0   ELIQUIS 5 MG TABS tablet Take 5 mg by mouth 2 (two) times daily.     escitalopram (LEXAPRO) 20 MG tablet Take 20 mg by mouth at bedtime.     folic acid (FOLVITE) 1 MG tablet TAKE ONE TABLET BY MOUTH ONE TIME DAILY 30 tablet 2   HYDROmorphone (DILAUDID) 2 MG tablet Take 1 tablet (2 mg total) by mouth every 6 (six) hours as needed for severe pain. 60 tablet 0   losartan (COZAAR) 50 MG tablet Take 50 mg by mouth daily.     mirtazapine (REMERON) 15 MG tablet Take by mouth.     morphine (MS CONTIN) 15 MG 12 hr tablet Take 1 tablet (15 mg total) by mouth every 12 (twelve) hours. 60 tablet 0   ondansetron (ZOFRAN-ODT) 4 MG disintegrating tablet Take 4 mg by mouth 2 (two) times daily as needed for nausea or vomiting. (  Patient not taking: Reported on 03/16/2021)     prochlorperazine (COMPAZINE) 10 MG tablet Take 1 tablet (10 mg total) by mouth every 6 (six) hours as needed for nausea or vomiting. (Patient not taking: Reported on 03/16/2021) 30 tablet 0   rosuvastatin (CRESTOR) 10 MG tablet Take 10 mg by mouth at bedtime.     No current facility-administered medications for this visit.    VITAL SIGNS: BP (!) 162/80 (BP Location: Left Arm, Patient Position: Sitting) Comment: nurse notified  Pulse 76   Temp 97.9 F (36.6 C) (Temporal)   Resp 17   Ht 5\' 5"  (1.651 m)   SpO2 100%   BMI 15.16 kg/m  Filed Weights    Estimated body mass index is 15.16  kg/m as calculated from the following:   Height as of this encounter: 5\' 5"  (1.651 m).   Weight as of 04/13/21: 91 lb 2 oz (41.3 kg).  LABS: CBC:    Component Value Date/Time   WBC 5.7 04/13/2021 0927   WBC 2.7 (L) 01/05/2021 0543   HGB 11.0 (L) 04/13/2021 0927   HCT 33.3 (L) 04/13/2021 0927   PLT 311 04/13/2021 0927   MCV 97.1 04/13/2021 0927   NEUTROABS 3.6 04/13/2021 0927   LYMPHSABS 1.0 04/13/2021 0927   MONOABS 0.9 04/13/2021 0927   EOSABS 0.2 04/13/2021 0927   BASOSABS 0.0 04/13/2021 0927   Comprehensive Metabolic Panel:    Component Value Date/Time   NA 135 04/13/2021 0927   K 3.9 04/13/2021 0927   CL 100 04/13/2021 0927   CO2 25 04/13/2021 0927   BUN 6 (L) 04/13/2021 0927   CREATININE 0.62 04/13/2021 0927   GLUCOSE 112 (H) 04/13/2021 0927   CALCIUM 8.9 04/13/2021 0927   AST 20 04/13/2021 0927   ALT 9 04/13/2021 0927   ALKPHOS 65 04/13/2021 0927   BILITOT 0.4 04/13/2021 0927   PROT 6.5 04/13/2021 0927   ALBUMIN 3.6 04/13/2021 0927    RADIOGRAPHIC STUDIES: No results found.  PERFORMANCE STATUS (ECOG) : 1 - Symptomatic but completely ambulatory   Physical Exam General: NAD, fatigued, ill-appearing Cardiovascular: regular rate and rhythm Pulmonary: clear ant fields Abdomen: soft, nontender, + bowel sounds Extremities: no edema, no joint deformities Skin: dry patches, scaly Neurological: Weakness, AAO x3  IMPRESSION:  This is my initial visit with [name]. She presents to the clinic in a wheelchair with her husband, [name].   I introduced myself, Jarrett Soho, RN, and palliative's role in collaboration with her oncology team. Concept of Palliative Care was introduced as specialized medical care for people and their families living with serious illness.  It focuses on providing relief from the symptoms and stress of a serious illness.  The goal is to improve quality of life for both the patient and the family. Values and goals of care important to patient and  family were attempted to be elicited. She and her husband verbalized appreciation of support.   [name] shares that she lives in the home with her husband and their two ragdoll cats. Her husband is from [place].  She is ambulatory in the home but with stand-by walker assistance over the past 2-3 weeks. She shares she was doing well and unfortunately one day began having increased pain and weakness which has been hard to control. Appetite is poor with unknown amount of weight loss She sleeps in the bed but again contributes her insomnia to her pain.   Pain: Patient is currently taking 30 mg of MS Contin twice  daily as prescribed by Cassie, PA. She reports some relief however pain is still uncontrolled. She was previously using dilaudid as needed for breakthrough pain which was providing additional relief. We discussed the use of dilaudid to hopefully assist in controlling her pain better and allow her to have increased functionality and quality of life. Education provided on the use of opioids. I also introduced celebrex as an additional option however after medication review she is actively taking Eliquis. We will hold off on the use of celebrex and closely evaluate pain in 1-2 weeks.  Constipation: Allante is currently taking senna daily to assist with bowel movements. Her usual pattern is every other day. Education provided on use of consistent bowel regimen to include Miralax in the setting of opioid use. Husband verbalized understanding and expresses plans to begin including this in her daily regimen.  Poor appetite/weight loss: Poor appetite with unknown weight loss. Current weight of 91lbs. Her weight has fluctuated over the past several months reaching a low of 87lbs and her high of 102lb in July 2022. She was previously taking mirtazapine however felt it was no longer working. Discussed restarting versus the use of something else. She would like to give marinol a try and plan to evaluate in a few  weeks.   I discussed the importance of continued conversation with family and their medical providers regarding overall plan of care and treatment options, ensuring decisions are within the context of the patients values and GOCs.  PLAN: Dilaudid 2mg  every 6 hours as needed for breakthrough pain MS Contin 30 mg twice daily as previously prescribed.  Senna and daily Miralax for constipation Marinol  for appetite stimulant. Education on use of protein shakes, protein rich foods, and small frequent meals vs large meals.  I will plan to see her back in 2 weeks.    Patient and husband expressed understanding and was in agreement with this plan. She also understands that She can call the clinic at any time with any questions, concerns, or complaints.   Time Total: 55 min  Visit consisted of counseling and education dealing with the complex and emotionally intense issues of symptom management and palliative care in the setting of serious and potentially life-threatening illness.Greater than 50%  of this time was spent counseling and coordinating care related to the above assessment and plan.  Signed by: Alda Lea, AGPCNP-BC Palliative Medicine Team

## 2021-04-30 ENCOUNTER — Encounter: Payer: Self-pay | Admitting: Physician Assistant

## 2021-05-01 NOTE — Progress Notes (Signed)
[facility] OFFICE PROGRESS NOTE  Kristi Glee., MD [address] [place] [address]  DIAGNOSIS: Stage IV (T3, N2, M1 C) non-small cell lung cancer with unknown histologic subtype.  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   DETECTED ALTERATION(S) / BIOMARKER(S)      % CFDNA OR AMPLIFICATION        ASSOCIATED FDA-APPROVED THERAPIES         CLINICAL TRIAL AVAILABILITY [id number] 8.9%   Sotorasib Yes [id number] 7.2% None     Yes  PRIOR THERAPY:  Palliative radiotherapy to the painful bone lesions under the care of Dr. Sondra Jennings.  CURRENT THERAPY: 1) Palliative systemic chemotherapy with carboplatin for AUC of 5, Alimta 500 Mg/M2 and Keytruda 200 Mg IV every 3 weeks.  First dose of treatment on December 29, 2020. Status post 6 cycles.  Starting from cycle #5, the patient is going to start maintenance Alimta and Keytruda. Keytruda discontinued from cycle #7 due to myalgias.  2)Zometa every 6 weeks starting on 02/10/21  INTERVAL HISTORY: Kristi Jennings 75 y.o. female returns to the clinic today for a follow-up visit accompanied by her friend.  At the patient's last appointment on 04/13/2021, the patient began experiencing 7 days of abrupt onset whole-body diffuse muscle soreness.  At that time she denied any recent exercise or strenuous activity.  She reported tenderness to palpation of her muscles.  Denied any recent illnesses or sick contacts.  She characterizes it as being similar to myalgias associated with the flu. Denying any symptoms of infection.  Her pain medication was increased to MS Contin 30 mg twice daily.  She followed up with palliative care for symptom management on 04/29/2021. She is scheduled to see them today as well. They prescribed Dilaudid 6 mg for breakthrough pain every 6 hours. She has not  noticed appreciable difference with her pain. They also discussed bowel prophylaxis. She has some constipation. She is taking a stool softener and laxative. She notes her bowel movement this morning was firm "pebbles".  The patient was previously prescribed Remeron for poor appetite but she discontinued this as she felt like it was not helping.  Palliative care prescribed Marinol instead. She gained 3 lbs since her last appointment. Her protein dropped on labs and her bilateral lower extremity pitting edema flared back up again since her last appointment. The patient is not a good candidate for Megace due to her recent small pulmonary embolism and she is currently taking Eliquis.  The patient has a history of polymyalgia rheumatica, she followed-up with Dr. Lavona Jennings recently due to her diffuse myalgias. He did not feel that her diffuse muscle pains were related to polymyalgia rheumatica.  Since she was last seen.  She denies any fever, chills, or night sweats although sometimes she feels like her hair is damp.  Denies any chest pain, cough, or hemoptysis; however, she notes that her breathing is more "stressed" at times.  Denies any nausea, vomiting, or diarrhea.Denies any rashes or skin changes.  Denies any headache or visual changes. The patient recently had a restaging CT scan performed.  She is here today for evaluation to review her scan results before starting cycle #7.  MEDICAL HISTORY: Past Medical History:  Diagnosis Date   Borderline diabetes mellitus    Bronchogenic cancer of right lung ([place])  Dyslipidemia    Hypertension    Polymyalgia rheumatica ([facility])     ALLERGIES:  is allergic to shellfish-derived products and augmentin [amoxicillin-pot clavulanate].  MEDICATIONS:  Current Outpatient Medications  Medication Sig Dispense Refill   methylPREDNISolone (MEDROL DOSEPAK) 4 MG TBPK tablet Use as instructed 21 tablet 0   acetaminophen (TYLENOL) 650 MG CR tablet Take 1,300 mg by mouth 3  (three) times daily as needed for pain.     aspirin EC 81 MG tablet Take 81 mg by mouth 4 (four) times a week. Swallow whole.     atenolol (TENORMIN) 25 MG tablet Take 12.5 mg by mouth at bedtime.     diclofenac Sodium (VOLTAREN) 1 % GEL Apply 4 g topically 4 (four) times daily. (Patient taking differently: Apply 4 g topically 4 (four) times daily as needed (pain).) 150 g 0   dronabinol (MARINOL) 2.5 MG capsule Take 1 capsule (2.5 mg total) by mouth 2 (two) times daily before a meal. 60 capsule 0   ELIQUIS 5 MG TABS tablet Take 5 mg by mouth 2 (two) times daily.     escitalopram (LEXAPRO) 20 MG tablet Take 20 mg by mouth at bedtime.     folic acid (FOLVITE) 1 MG tablet TAKE ONE TABLET BY MOUTH ONE TIME DAILY 30 tablet 2   HYDROmorphone (DILAUDID) 2 MG tablet Take 1-2 tablets (2-4 mg total) by mouth every 6 (six) hours as needed for severe pain. 60 tablet 0   losartan (COZAAR) 50 MG tablet Take 50 mg by mouth daily.     morphine (MS CONTIN) 15 MG 12 hr tablet Take 2 tablets (30 mg total) by mouth every 12 (twelve) hours. 60 tablet 0   ondansetron (ZOFRAN-ODT) 4 MG disintegrating tablet Take 4 mg by mouth 2 (two) times daily as needed for nausea or vomiting. (Patient not taking: Reported on 03/16/2021)     prochlorperazine (COMPAZINE) 10 MG tablet Take 1 tablet (10 mg total) by mouth every 6 (six) hours as needed for nausea or vomiting. (Patient not taking: Reported on 03/16/2021) 30 tablet 0   rosuvastatin (CRESTOR) 10 MG tablet Take 10 mg by mouth at bedtime.     No current facility-administered medications for this visit.   Facility-Administered Medications Ordered in Other Visits  Medication Dose Route Frequency Provider Last Rate Last Admin   PEMEtrexed (ALIMTA) 700 mg in sodium chloride 0.9 % 100 mL chemo infusion  500 mg/m2 (Treatment Plan Recorded) Intravenous Once Curt Bears, MD        SURGICAL HISTORY: No past surgical history on file.  REVIEW OF SYSTEMS:   Review of Systems   Constitutional: Positive for up and down appetite and fatigue following treatment. Negative for chills, fever and unexpected weight change.  HENT: Negative for mouth sores, nosebleeds, sore throat and trouble swallowing.   Eyes: Negative for eye problems and icterus.  Respiratory: Positive for occasional dyspnea on exertion. Negative for cough, hemoptysis and wheezing.   Cardiovascular: Positive for mild bilateral lower extremity pitting edema (R>L). Negative for chest pain and leg swelling.  Gastrointestinal: Positive for constipation. Negative for abdominal pain,diarrhea, nausea and vomiting.  Genitourinary: Negative for bladder incontinence, difficulty urinating, dysuria, frequency and hematuria.   Musculoskeletal: Positive for diffuse and generalized muscle aches.  Negative for gait problem, neck pain and neck stiffness.  Skin: Negative for itching and rash.  Neurological: Negative for dizziness, extremity weakness, gait problem, headaches, light-headedness and seizures.  Hematological: Negative for adenopathy. Does not bruise/bleed easily.  Psychiatric/Behavioral: Negative  for confusion, depression and sleep disturbance. The patient is not nervous/anxious.   PHYSICAL EXAMINATION:  Blood pressure 108/60, pulse 61, temperature 98.2 F (36.8 C), temperature source Tympanic, resp. rate 17, weight 94 lb 4.8 oz (42.8 kg), SpO2 96 %.  ECOG PERFORMANCE STATUS: 1  Physical Exam  Constitutional: Oriented to person, place, and time and cachetic appearing female, and in no distress.  HENT:  Head: Normocephalic and atraumatic.  Mouth/Throat: Oropharynx is clear and moist. No oropharyngeal exudate.  Eyes: Conjunctivae are normal. Right eye exhibits no discharge. Left eye exhibits no discharge. No scleral icterus.  Neck: Normal range of motion. Neck supple.  Cardiovascular: Normal rate, regular rhythm, normal heart sounds and intact distal pulses.   Pulmonary/Chest: Effort normal and breath  sounds normal. No respiratory distress. No wheezes. No rales.  Abdominal: Soft. Bowel sounds are normal. Exhibits no distension and no mass. There is no tenderness.  Musculoskeletal: Normal range of motion.Positive for mild bilateral lower extremity pitting edema (R>L) Lymphadenopathy:    No cervical adenopathy.  Neurological: Alert and oriented to person, place, and time. Exhibits muscle wasting. Examined in the wheelchair.  Skin: Skin is warm and dry. No rash noted. Not diaphoretic. No erythema. No pallor.  Psychiatric: Mood, memory and judgment normal.  Vitals reviewed.  LABORATORY DATA: Lab Results  Component Value Date   WBC 5.8 05/04/2021   HGB 9.8 (L) 05/04/2021   HCT 30.1 (L) 05/04/2021   MCV 96.2 05/04/2021   PLT 285 05/04/2021      Chemistry      Component Value Date/Time   NA 135 05/04/2021 0929   K 3.9 05/04/2021 0929   CL 100 05/04/2021 0929   CO2 26 05/04/2021 0929   BUN 8 05/04/2021 0929   CREATININE 0.57 05/04/2021 0929      Component Value Date/Time   CALCIUM 8.4 (L) 05/04/2021 0929   ALKPHOS 56 05/04/2021 0929   AST 17 05/04/2021 0929   ALT 6 05/04/2021 0929   BILITOT 0.5 05/04/2021 0929       RADIOGRAPHIC STUDIES:  CT Chest W Contrast  Result Date: 05/02/2021 CLINICAL DATA:  75 year old female with history of non-small cell lung cancer status post chemotherapy and radiation therapy. Ongoing Keytruda therapy. Radiation therapy is now complete. Follow-up study. EXAM: CT CHEST, ABDOMEN, AND PELVIS WITH CONTRAST TECHNIQUE: Multidetector CT imaging of the chest, abdomen and pelvis was performed following the standard protocol during bolus administration of intravenous contrast. CONTRAST:  62m OMNIPAQUE IOHEXOL 350 MG/ML SOLN COMPARISON:  CT the chest, abdomen and pelvis 02/19/2021. FINDINGS: CT CHEST FINDINGS Cardiovascular: Heart size is normal. There is no significant pericardial fluid, thickening or pericardial calcification. There is aortic  atherosclerosis, as well as atherosclerosis of the great vessels of the mediastinum and the coronary arteries, including calcified atherosclerotic plaque in the left main, left anterior descending, left circumflex and right coronary arteries. Ectasia of ascending thoracic aorta (4.2 cm in diameter). Mediastinum/Nodes: No pathologically enlarged mediastinal or hilar lymph nodes. Esophagus is unremarkable in appearance. No axillary lymphadenopathy. Lungs/Pleura: Treated partially cavitary mass in the right upper lobe is again noted (axial image 26 of series 4 and coronal image 51 of series 5), slightly more prominent in appearance than the prior examination, currently measuring 6.2 x 4.3 x 5.9 cm (previously 5.4 x 3.5 x 6.0 cm). Areas of septal thickening and architectural distortion surrounding the right upper lobe mass are presumably related to evolving postradiation changes. This mass abuts the major fissure, and a small portion of  the lesion may transgress the fissure into the superior segment of the right lower lobe (axial image 24 of series 4). No definite new suspicious appearing pulmonary nodules or masses are otherwise noted. No acute consolidative airspace disease. No pleural effusions. Musculoskeletal: Multiple aggressive appearing osseous lesions are again noted, compatible with widespread metastatic disease to the bones, most notable a destructive lesion in the anterolateral aspect of the right third rib. There is also a lytic lesion with chronic pathologic fracture in the upper sternum, similar to the prior examination. Chronic compression fractures of T4, T8 and T12 are again noted (T8 and T12 are likely pathologic fractures), most severe at T12 where there is 80% loss of anterior vertebral body height. CT ABDOMEN PELVIS FINDINGS Hepatobiliary: Previously noted hypovascular lesion in segment 7 of the liver is slightly decreased compared to the prior study, currently measuring 1.6 x 1.4 cm (previously  2.3 x 2.0 cm), with persistent surrounding perfusion anomaly. No new hepatic lesions are otherwise noted. No intra or extrahepatic biliary ductal dilatation. Gallbladder is normal in appearance. Pancreas: No pancreatic mass. No pancreatic ductal dilatation. No pancreatic or peripancreatic fluid collections or inflammatory changes. Spleen: Unremarkable. Adrenals/Urinary Tract: Previously noted right adrenal nodule appears more prominent than the prior study, currently measuring 2.7 x 2.0 cm (previously 2.5 x 1.7 cm). Left adrenal gland and bilateral kidneys are normal in appearance. No hydroureteronephrosis. Urinary bladder is nearly completely decompressed, but otherwise unremarkable in appearance. Stomach/Bowel: The appearance of the stomach is normal. There is no pathologic dilatation of small bowel or colon. The appendix is not confidently identified and may be surgically absent. Regardless, there are no inflammatory changes noted adjacent to the cecum to suggest the presence of an acute appendicitis at this time. Vascular/Lymphatic: Aortic atherosclerosis, without evidence of aneurysm or dissection in the abdominal or pelvic vasculature. No lymphadenopathy noted in the abdomen or pelvis. Reproductive: Uterus and ovaries are atrophic. Other: No significant volume of ascites.  No pneumoperitoneum. Musculoskeletal: Multiple lucent regions are again noted in the visualized portions of the proximal femurs bilaterally, likely to reflect metastatic lesions. IMPRESSION: 1. Today's study demonstrates a mixed response to therapy. Specifically, the primary lesion in the right upper lobe appears larger than the prior examination, as does the right adrenal metastasis. However, the metastatic lesion in segment 7 of the liver shows regression compared to the prior examination. Widespread metastatic disease to the bones appear grossly similar to the prior study. No new metastatic disease is noted elsewhere in the chest, abdomen  or pelvis. 2. Aortic atherosclerosis, in addition to left main and 3 vessel coronary artery disease. Assessment for potential risk factor modification, dietary therapy or pharmacologic therapy may be warranted, if clinically indicated. 3. Ectasia of the ascending thoracic aorta (4.2 cm in diameter). Attention at time of routine follow-up examinations is recommended to ensure stability of this finding. 4. Additional incidental findings, as above. Electronically Signed   By: Vinnie Langton M.D.   On: 05/02/2021 10:23   CT Abdomen Pelvis W Contrast  Result Date: 05/02/2021 CLINICAL DATA:  75 year old female with history of non-small cell lung cancer status post chemotherapy and radiation therapy. Ongoing Keytruda therapy. Radiation therapy is now complete. Follow-up study. EXAM: CT CHEST, ABDOMEN, AND PELVIS WITH CONTRAST TECHNIQUE: Multidetector CT imaging of the chest, abdomen and pelvis was performed following the standard protocol during bolus administration of intravenous contrast. CONTRAST:  26m OMNIPAQUE IOHEXOL 350 MG/ML SOLN COMPARISON:  CT the chest, abdomen and pelvis 02/19/2021. FINDINGS: CT CHEST  FINDINGS Cardiovascular: Heart size is normal. There is no significant pericardial fluid, thickening or pericardial calcification. There is aortic atherosclerosis, as well as atherosclerosis of the great vessels of the mediastinum and the coronary arteries, including calcified atherosclerotic plaque in the left main, left anterior descending, left circumflex and right coronary arteries. Ectasia of ascending thoracic aorta (4.2 cm in diameter). Mediastinum/Nodes: No pathologically enlarged mediastinal or hilar lymph nodes. Esophagus is unremarkable in appearance. No axillary lymphadenopathy. Lungs/Pleura: Treated partially cavitary mass in the right upper lobe is again noted (axial image 26 of series 4 and coronal image 51 of series 5), slightly more prominent in appearance than the prior examination,  currently measuring 6.2 x 4.3 x 5.9 cm (previously 5.4 x 3.5 x 6.0 cm). Areas of septal thickening and architectural distortion surrounding the right upper lobe mass are presumably related to evolving postradiation changes. This mass abuts the major fissure, and a small portion of the lesion may transgress the fissure into the superior segment of the right lower lobe (axial image 24 of series 4). No definite new suspicious appearing pulmonary nodules or masses are otherwise noted. No acute consolidative airspace disease. No pleural effusions. Musculoskeletal: Multiple aggressive appearing osseous lesions are again noted, compatible with widespread metastatic disease to the bones, most notable a destructive lesion in the anterolateral aspect of the right third rib. There is also a lytic lesion with chronic pathologic fracture in the upper sternum, similar to the prior examination. Chronic compression fractures of T4, T8 and T12 are again noted (T8 and T12 are likely pathologic fractures), most severe at T12 where there is 80% loss of anterior vertebral body height. CT ABDOMEN PELVIS FINDINGS Hepatobiliary: Previously noted hypovascular lesion in segment 7 of the liver is slightly decreased compared to the prior study, currently measuring 1.6 x 1.4 cm (previously 2.3 x 2.0 cm), with persistent surrounding perfusion anomaly. No new hepatic lesions are otherwise noted. No intra or extrahepatic biliary ductal dilatation. Gallbladder is normal in appearance. Pancreas: No pancreatic mass. No pancreatic ductal dilatation. No pancreatic or peripancreatic fluid collections or inflammatory changes. Spleen: Unremarkable. Adrenals/Urinary Tract: Previously noted right adrenal nodule appears more prominent than the prior study, currently measuring 2.7 x 2.0 cm (previously 2.5 x 1.7 cm). Left adrenal gland and bilateral kidneys are normal in appearance. No hydroureteronephrosis. Urinary bladder is nearly completely decompressed,  but otherwise unremarkable in appearance. Stomach/Bowel: The appearance of the stomach is normal. There is no pathologic dilatation of small bowel or colon. The appendix is not confidently identified and may be surgically absent. Regardless, there are no inflammatory changes noted adjacent to the cecum to suggest the presence of an acute appendicitis at this time. Vascular/Lymphatic: Aortic atherosclerosis, without evidence of aneurysm or dissection in the abdominal or pelvic vasculature. No lymphadenopathy noted in the abdomen or pelvis. Reproductive: Uterus and ovaries are atrophic. Other: No significant volume of ascites.  No pneumoperitoneum. Musculoskeletal: Multiple lucent regions are again noted in the visualized portions of the proximal femurs bilaterally, likely to reflect metastatic lesions. IMPRESSION: 1. Today's study demonstrates a mixed response to therapy. Specifically, the primary lesion in the right upper lobe appears larger than the prior examination, as does the right adrenal metastasis. However, the metastatic lesion in segment 7 of the liver shows regression compared to the prior examination. Widespread metastatic disease to the bones appear grossly similar to the prior study. No new metastatic disease is noted elsewhere in the chest, abdomen or pelvis. 2. Aortic atherosclerosis, in addition to left  main and 3 vessel coronary artery disease. Assessment for potential risk factor modification, dietary therapy or pharmacologic therapy may be warranted, if clinically indicated. 3. Ectasia of the ascending thoracic aorta (4.2 cm in diameter). Attention at time of routine follow-up examinations is recommended to ensure stability of this finding. 4. Additional incidental findings, as above. Electronically Signed   By: Vinnie Langton M.D.   On: 05/02/2021 10:23     ASSESSMENT/PLAN:  This is a very pleasant 75 year old Caucasian female diagnosed with stage IV (T3, N2, M1 C) non-small cell lung  cancer with unknown histologic subtype questionably likely to be squamous cell carcinoma based on the morphology of the right upper lobe cavitary mass with mediastinal lymphadenopathy metastatic disease to the bones as well as the right adrenal gland.  She was diagnosed in June 2022.  Adenocarcinoma cannot be completely ruled out especially with the K-ras G 12 C mutation.  Since the patient has an actionable mutation with K-ras G 12 C, this can be used as an option in the second line setting.  The patient completed palliative radiotherapy to multiple metastatic bone lesions under the care of Dr. Sondra Jennings.  The patient is currently undergoing systemic chemotherapy with carboplatin AUC 5, Alimta 500 mg per metered squared, Keytruda 200 mg IV every 3 weeks.  She is status post 6 cycles.  Starting from cycle #5, the patient will be on maintenance Alimta 500 mg per metered squared and Keytruda 200 mg. We will discontinue Keytruda starting from cycle #7 due to her significant myalgias in the event this is immunotherapy related.   The patient recently had a restaging CT scan performed. Dr. Julien Nordmann personally and independently reviewed the scan and discussed the results with the patient. The scan showed a mixed response to treatment. There was decrease in size of the liver lesion; however, the primary lung mass and adrenal lesion has increased a little in size. Given the patient's poor performance status and mixed response, Dr. Julien Nordmann does not want to change treatment at this time to a different chemotherapy. Dr. Julien Nordmann recommends continuing with single agent Alimta today as scheduled and re-referring the patient back to Dr. Sondra Jennings for consideration of radiation to the enlarging primary lung mass and adrenal lesion. Of course, if future imaging continues to show enlargement or disease progression, then we will likely have to talk about alternative chemotherapy regimens at that time.   I have placed the referral to  Dr. Sondra Jennings.   She will proceed with cycle #7 today as scheduled with single agent Alimta. We will discontinue keytruda in case her significant myalgias are from her immunotherapy. I have sent in a medrol dose pack to her pharmacy to see if that offers improvement in her myalgias. Advised to take this in the mornings as it can cause insomnia. She has not had significant relieve in her pain with her 30 mg of ms contin and 4 mg of dilaudid for breath through pain. She will follow up with palliative care later today for reassessment of her symptoms.   We will see her back for a follow up visit in 3 weeks for evaluation before starting cycle #8.   She will continue marinol for appetite.   I ordered CK to rule out myopathy which came back within normal limits.   For her mild pitting edema, her albumin has dropped since her last appointment which corresponds to the flair up of her mild bilateral pitting edema/swelling. Advised to elevate her legs and use compression stockings.  I also encouraged her to increase her protein intake. She is already on a blood thinner and is compliant so low suspicion for DVT but we will monitor at future appointments. No calf pain or erythema.   She will continue taking Zometa every 6 weeks for metastatic bone lesions.  The patient was advised to call immediately if she has any concerning symptoms in the interval. The patient voices understanding of current disease status and treatment options and is in agreement with the current care plan. All questions were answered. The patient knows to call the clinic with any problems, questions or concerns. We can certainly see the patient much sooner if necessary        Orders Placed This Encounter  Procedures   Ambulatory referral to Radiation Oncology    Referral Priority:   Routine    Referral Type:   Consultation    Referral Reason:   Specialty Services Required    Requested Specialty:   Radiation Oncology    Number of  Visits Requested:   [place], PA-C 05/04/21  ADDENDUM: Hematology/Oncology Attending: I had a face-to-face encounter with the patient today.  I reviewed her records, lab, scan and recommended her care plan.  This is a very pleasant 75 years old white female diagnosed with a stage IV (T3, N2, M1 C) non-small cell lung cancer likely adenocarcinoma in June 2022 with KRAS G12C mutation. The patient is currently undergoing systemic chemotherapy initially with carboplatin, Alimta and Keytruda and recently on maintenance treatment with Alimta and Keytruda every 3 weeks. She has been tolerating her treatment well except for aching pain all over her body likely secondary to myalgia and arthralgia from [facility].  She is currently on several pain medication with MS Contin and morphine sulfate with poor control of her pain. She had repeat CT scan of the chest, abdomen pelvis that showed mixed response with increase in the size of the primary lung mass in addition to increase and adrenal metastasis but there was improvement in the liver lesions. I had a lengthy discussion with the patient and her daughter about her current condition and treatment options.  I gave the patient the option of continuing her treatment with single agent Alimta but we will discontinue Keytruda because of her generalized body ache and pain.  At the same time I will refer the patient to Dr. Sondra Jennings for consideration of palliative radiotherapy to the enlarging right lung mass as well as the adrenal metastasis. We will continue to monitor her closely on the maintenance Alimta and if the patient has any evidence for disease progression in the future, we will consider her for treatment with Lumakras (Sotorasib) since the patient has KRAS G12C mutation. The patient and her daughter are in agreement with the current plan. For the pain management she will continue with her current pain medication for now and she was also  referred to the palliative care clinic for evaluation. The patient was advised to call immediately if she has any other concerning symptoms in the interval.  The total time spent in the appointment was 35 minutes. Disclaimer: This note was dictated with voice recognition software. Similar sounding words can inadvertently be transcribed and may be missed upon review. Eilleen Kempf, MD 05/04/21

## 2021-05-02 ENCOUNTER — Telehealth: Payer: Self-pay

## 2021-05-02 NOTE — Telephone Encounter (Signed)
Meredith Staggers, Mrs. Kosanke's husband, called this morning to notify us that Mrs. Muhl's pain was still very high. He stated that she had just started taking the Dilaudid tablet this morning, as they weren't able to get the medication over the weekend. I notified [name], NP who advised her to alternate dosing-2mg  (1 tablet), wait four hours, 4mg  (2 tablets), wait four hours, 2mg  (1 tablet), etc for the next 24 hours to help get the pain under control. I relayed this information to Mr. Mccullars  and told him I would call him in the morning to follow up with her pain control. I advised to call back with any questions/concerns. Understanding verbalized. All questions answered.

## 2021-05-04 ENCOUNTER — Inpatient Hospital Stay: Payer: Medicare HMO

## 2021-05-04 ENCOUNTER — Inpatient Hospital Stay (HOSPITAL_BASED_OUTPATIENT_CLINIC_OR_DEPARTMENT_OTHER): Payer: Medicare HMO | Admitting: Nurse Practitioner

## 2021-05-04 ENCOUNTER — Telehealth: Payer: Self-pay

## 2021-05-04 ENCOUNTER — Other Ambulatory Visit: Payer: Self-pay

## 2021-05-04 ENCOUNTER — Inpatient Hospital Stay (HOSPITAL_BASED_OUTPATIENT_CLINIC_OR_DEPARTMENT_OTHER): Payer: Medicare HMO | Admitting: Physician Assistant

## 2021-05-04 VITALS — BP 108/60 | HR 61 | Temp 98.2°F | Resp 17 | Wt 94.3 lb

## 2021-05-04 DIAGNOSIS — R600 Localized edema: Secondary | ICD-10-CM | POA: Diagnosis not present

## 2021-05-04 DIAGNOSIS — K5900 Constipation, unspecified: Secondary | ICD-10-CM

## 2021-05-04 DIAGNOSIS — I10 Essential (primary) hypertension: Secondary | ICD-10-CM | POA: Diagnosis not present

## 2021-05-04 DIAGNOSIS — C3491 Malignant neoplasm of unspecified part of right bronchus or lung: Secondary | ICD-10-CM

## 2021-05-04 DIAGNOSIS — C7951 Secondary malignant neoplasm of bone: Secondary | ICD-10-CM

## 2021-05-04 DIAGNOSIS — Z5112 Encounter for antineoplastic immunotherapy: Secondary | ICD-10-CM

## 2021-05-04 DIAGNOSIS — Z87891 Personal history of nicotine dependence: Secondary | ICD-10-CM | POA: Diagnosis not present

## 2021-05-04 DIAGNOSIS — Z515 Encounter for palliative care: Secondary | ICD-10-CM | POA: Diagnosis not present

## 2021-05-04 DIAGNOSIS — I2510 Atherosclerotic heart disease of native coronary artery without angina pectoris: Secondary | ICD-10-CM | POA: Diagnosis not present

## 2021-05-04 DIAGNOSIS — C3411 Malignant neoplasm of upper lobe, right bronchus or lung: Secondary | ICD-10-CM | POA: Diagnosis not present

## 2021-05-04 DIAGNOSIS — G893 Neoplasm related pain (acute) (chronic): Secondary | ICD-10-CM

## 2021-05-04 DIAGNOSIS — R531 Weakness: Secondary | ICD-10-CM | POA: Diagnosis not present

## 2021-05-04 DIAGNOSIS — I700 Atherosclerosis of aorta: Secondary | ICD-10-CM | POA: Diagnosis not present

## 2021-05-04 DIAGNOSIS — Z5111 Encounter for antineoplastic chemotherapy: Secondary | ICD-10-CM | POA: Diagnosis not present

## 2021-05-04 DIAGNOSIS — C7971 Secondary malignant neoplasm of right adrenal gland: Secondary | ICD-10-CM | POA: Diagnosis not present

## 2021-05-04 DIAGNOSIS — Z79899 Other long term (current) drug therapy: Secondary | ICD-10-CM | POA: Diagnosis not present

## 2021-05-04 DIAGNOSIS — R590 Localized enlarged lymph nodes: Secondary | ICD-10-CM | POA: Diagnosis not present

## 2021-05-04 DIAGNOSIS — M791 Myalgia, unspecified site: Secondary | ICD-10-CM

## 2021-05-04 LAB — CMP (CANCER CENTER ONLY)
ALT: 6 U/L (ref 0–44)
AST: 17 U/L (ref 15–41)
Albumin: 3.1 g/dL — ABNORMAL LOW (ref 3.5–5.0)
Alkaline Phosphatase: 56 U/L (ref 38–126)
Anion gap: 9 (ref 5–15)
BUN: 8 mg/dL (ref 8–23)
CO2: 26 mmol/L (ref 22–32)
Calcium: 8.4 mg/dL — ABNORMAL LOW (ref 8.9–10.3)
Chloride: 100 mmol/L (ref 98–111)
Creatinine: 0.57 mg/dL (ref 0.44–1.00)
GFR, Estimated: >60 mL/min (ref >60)
Glucose, Bld: 120 mg/dL — ABNORMAL HIGH (ref 70–99)
Potassium: 3.9 mmol/L (ref 3.5–5.1)
Sodium: 135 mmol/L (ref 135–145)
Total Bilirubin: 0.5 mg/dL (ref 0.3–1.2)
Total Protein: 6.4 g/dL — ABNORMAL LOW (ref 6.5–8.1)

## 2021-05-04 LAB — CBC WITH DIFFERENTIAL (CANCER CENTER ONLY)
Abs Immature Granulocytes: 0.02 10*3/uL (ref 0.00–0.07)
Basophils Absolute: 0 10*3/uL (ref 0.0–0.1)
Basophils Relative: 1 %
Eosinophils Absolute: 0.4 10*3/uL (ref 0.0–0.5)
Eosinophils Relative: 6 %
HCT: 30.1 % — ABNORMAL LOW (ref 36.0–46.0)
Hemoglobin: 9.8 g/dL — ABNORMAL LOW (ref 12.0–15.0)
Immature Granulocytes: 0 %
Lymphocytes Relative: 12 %
Lymphs Abs: 0.7 10*3/uL (ref 0.7–4.0)
MCH: 31.3 pg (ref 26.0–34.0)
MCHC: 32.6 g/dL (ref 30.0–36.0)
MCV: 96.2 fL (ref 80.0–100.0)
Monocytes Absolute: 1 10*3/uL (ref 0.1–1.0)
Monocytes Relative: 18 %
Neutro Abs: 3.7 10*3/uL (ref 1.7–7.7)
Neutrophils Relative %: 63 %
Platelet Count: 285 10*3/uL (ref 150–400)
RBC: 3.13 MIL/uL — ABNORMAL LOW (ref 3.87–5.11)
RDW: 14 % (ref 11.5–15.5)
WBC Count: 5.8 10*3/uL (ref 4.0–10.5)
nRBC: 0 % (ref 0.0–0.2)

## 2021-05-04 LAB — TSH: TSH: 4.302 u[IU]/mL — ABNORMAL HIGH (ref 0.308–3.960)

## 2021-05-04 LAB — CK: Total CK: 72 U/L (ref 38–234)

## 2021-05-04 MED ORDER — SODIUM CHLORIDE 0.9 % IV SOLN: Freq: Once | INTRAVENOUS | Status: AC

## 2021-05-04 MED ORDER — SODIUM CHLORIDE 0.9 % IV SOLN
500.0000 mg/m2 | Freq: Once | INTRAVENOUS | Status: AC
  Administered 2021-05-04: 13:00:00 700 mg via INTRAVENOUS
  Filled 2021-05-04: qty 20

## 2021-05-04 MED ORDER — HYDROMORPHONE HCL 2 MG PO TABS
2.0000 mg | ORAL_TABLET | Freq: Four times a day (QID) | ORAL | 0 refills | Status: DC | PRN
Start: 2021-05-04 — End: 2021-05-10

## 2021-05-04 MED ORDER — MORPHINE SULFATE ER 15 MG PO TBCR: 30.0000 mg | EXTENDED_RELEASE_TABLET | Freq: Two times a day (BID) | ORAL | 0 refills | Status: DC

## 2021-05-04 MED ORDER — METHYLPREDNISOLONE 4 MG PO TBPK
ORAL_TABLET | ORAL | 0 refills | Status: DC
Start: 2021-05-04 — End: 2021-07-27

## 2021-05-04 NOTE — Patient Instructions (Signed)
[facility]   Discharge Instructions: Thank you for choosing [facility] to provide your oncology and hematology care.   If you have a lab appointment with the [facility], please go directly to the [facility] and check in at the registration area.   Wear comfortable clothing and clothing appropriate for easy access to any Portacath or PICC line.   We strive to give you quality time with your provider. You may need to reschedule your appointment if you arrive late (15 or more minutes).  Arriving late affects you and other patients whose appointments are after yours.  Also, if you miss three or more appointments without notifying the office, you may be dismissed from the clinic at the providers discretion.      For prescription refill requests, have your pharmacy contact our office and allow 72 hours for refills to be completed.    Today you received the following chemotherapy and/or immunotherapy agents: pemetrexed      To help prevent nausea and vomiting after your treatment, we encourage you to take your nausea medication as directed.  BELOW ARE SYMPTOMS THAT SHOULD BE REPORTED IMMEDIATELY: *FEVER GREATER THAN 100.4 F (38 C) OR HIGHER *CHILLS OR SWEATING *NAUSEA AND VOMITING THAT IS NOT CONTROLLED WITH YOUR NAUSEA MEDICATION *UNUSUAL SHORTNESS OF BREATH *UNUSUAL BRUISING OR BLEEDING *URINARY PROBLEMS (pain or burning when urinating, or frequent urination) *BOWEL PROBLEMS (unusual diarrhea, constipation, pain near the anus) TENDERNESS IN MOUTH AND THROAT WITH OR WITHOUT PRESENCE OF ULCERS (sore throat, sores in mouth, or a toothache) UNUSUAL RASH, SWELLING OR PAIN  UNUSUAL VAGINAL DISCHARGE OR ITCHING   Items with * indicate a potential emergency and should be followed up as soon as possible or go to the Emergency Department if any problems should occur.  Please show the CHEMOTHERAPY ALERT CARD or IMMUNOTHERAPY ALERT CARD at check-in  to the Emergency Department and triage nurse.  Should you have questions after your visit or need to cancel or reschedule your appointment, please contact [facility]  Dept: [phone]  and follow the prompts.  Office hours are 8:00 a.m. to 4:30 p.m. Monday - Friday. Please note that voicemails left after 4:00 p.m. may not be returned until the following business day.  We are closed weekends and major holidays. You have access to a nurse at all times for urgent questions. Please call the main number to the clinic Dept: [phone] and follow the prompts.   For any non-urgent questions, you may also contact your provider using MyChart. We now offer e-Visits for anyone 42 and older to request care online for non-urgent symptoms. For details visit [email or web address].   Also download the MyChart app! Go to the app store, search "MyChart", open the app, select [facility], and log in with your MyChart username and password.  Due to Covid, a mask is required upon entering the hospital/clinic. If you do not have a mask, one will be given to you upon arrival. For doctor visits, patients may have 1 support person aged 76 or older with them. For treatment visits, patients cannot have anyone with them due to current Covid guidelines and our immunocompromised population.

## 2021-05-04 NOTE — Progress Notes (Signed)
[facility]  [phone] [phone]   Name: Kristi Jennings Date: 05/04/2021 MRN: [id number]  DOB: 20-Sep-1945  Patient Care Team: [name], MD as PCP - General (Internal Medicine) [facility] Registered Nurse [facility] and Palliative Medicine)    Interval History:   Kristi Jennings is a 75 y.o. 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 Palliative ask to see for symptom management   SOCIAL HISTORY:     reports that she quit smoking about 12 years ago. Her smoking use included cigarettes. She has a 43.00 pack-year smoking history. She has never used smokeless tobacco. She reports that she does not currently use alcohol. She reports that she does not use drugs.  ADVANCE DIRECTIVES:  None on file   CODE STATUS:   PAST MEDICAL HISTORY: Past Medical History:  Diagnosis Date   Borderline diabetes mellitus    Bronchogenic cancer of right lung (HCC)    Dyslipidemia    Hypertension    Polymyalgia rheumatica (HCC)      HEMATOLOGY/ONCOLOGY HISTORY:  Oncology History  Non-small cell carcinoma of lung, stage 4, right ([facility])  11/30/2020 Initial Diagnosis   Non-small cell carcinoma of lung, stage 4, right ([facility])   11/30/2020 Cancer Staging   Staging form: Lung, AJCC 8th Edition - Clinical: Stage IVB (cT3, cN2, cM1c) - Signed by Curt Bears, MD on 11/30/2020    12/29/2020 -  Chemotherapy   Patient is on Treatment Plan : LUNG CARBOplatin / Pemetrexed / Pembrolizumab q21d Induction x 4 cycles / Maintenance Pemetrexed + Pembrolizumab       ALLERGIES:  is allergic to shellfish-derived products and augmentin [amoxicillin-pot clavulanate].  MEDICATIONS:  Current Outpatient Medications  Medication Sig Dispense Refill   acetaminophen (TYLENOL) 650 MG CR tablet Take 1,300 mg by mouth 3 (three) times  daily as needed for pain.     aspirin EC 81 MG tablet Take 81 mg by mouth 4 (four) times a week. Swallow whole.     atenolol (TENORMIN) 25 MG tablet Take 12.5 mg by mouth at bedtime.     diclofenac Sodium (VOLTAREN) 1 % GEL Apply 4 g topically 4 (four) times daily. (Patient taking differently: Apply 4 g topically 4 (four) times daily as needed (pain).) 150 g 0   dronabinol (MARINOL) 2.5 MG capsule Take 1 capsule (2.5 mg total) by mouth 2 (two) times daily before a meal. 60 capsule 0   ELIQUIS 5 MG TABS tablet Take 5 mg by mouth 2 (two) times daily.     escitalopram (LEXAPRO) 20 MG tablet Take 20 mg by mouth at bedtime.     folic acid (FOLVITE) 1 MG tablet TAKE ONE TABLET BY MOUTH ONE TIME DAILY 30 tablet 2   HYDROmorphone (DILAUDID) 2 MG tablet Take 1-2 tablets (2-4 mg total) by mouth every 6 (six) hours as needed for severe pain. 60 tablet 0   losartan (COZAAR) 50 MG tablet Take 50 mg by mouth daily.     methylPREDNISolone (MEDROL DOSEPAK) 4 MG TBPK tablet Use as instructed 21 tablet 0   morphine (MS CONTIN) 15 MG 12 hr tablet Take 2 tablets (30 mg total) by mouth every 12 (twelve) hours. 60 tablet 0   ondansetron (ZOFRAN-ODT) 4 MG disintegrating tablet Take 4 mg by mouth 2 (two) times daily as needed for nausea or vomiting. (Patient not taking: Reported on  03/16/2021)     prochlorperazine (COMPAZINE) 10 MG tablet Take 1 tablet (10 mg total) by mouth every 6 (six) hours as needed for nausea or vomiting. (Patient not taking: Reported on 03/16/2021) 30 tablet 0   rosuvastatin (CRESTOR) 10 MG tablet Take 10 mg by mouth at bedtime.     No current facility-administered medications for this visit.   Facility-Administered Medications Ordered in Other Visits  Medication Dose Route Frequency Provider Last Rate Last Admin   PEMEtrexed (ALIMTA) 700 mg in sodium chloride 0.9 % 100 mL chemo infusion  500 mg/m2 (Treatment Plan Recorded) Intravenous Once Curt Bears, MD        VITAL SIGNS: There were  no vitals taken for this visit. There were no vitals filed for this visit.  Estimated body mass index is 15.69 kg/m as calculated from the following:   Height as of 04/29/21: 5\' 5"  (1.651 m).   Weight as of an earlier encounter on 05/04/21: 94 lb 4.8 oz (42.8 kg).  LABS: CBC:    Component Value Date/Time   WBC 5.8 05/04/2021 0929   WBC 2.7 (L) 01/05/2021 0543   HGB 9.8 (L) 05/04/2021 0929   HCT 30.1 (L) 05/04/2021 0929   PLT 285 05/04/2021 0929   MCV 96.2 05/04/2021 0929   NEUTROABS 3.7 05/04/2021 0929   LYMPHSABS 0.7 05/04/2021 0929   MONOABS 1.0 05/04/2021 0929   EOSABS 0.4 05/04/2021 0929   BASOSABS 0.0 05/04/2021 0929   Comprehensive Metabolic Panel:    Component Value Date/Time   NA 135 05/04/2021 0929   K 3.9 05/04/2021 0929   CL 100 05/04/2021 0929   CO2 26 05/04/2021 0929   BUN 8 05/04/2021 0929   CREATININE 0.57 05/04/2021 0929   GLUCOSE 120 (H) 05/04/2021 0929   CALCIUM 8.4 (L) 05/04/2021 0929   AST 17 05/04/2021 0929   ALT 6 05/04/2021 0929   ALKPHOS 56 05/04/2021 0929   BILITOT 0.5 05/04/2021 0929   PROT 6.4 (L) 05/04/2021 0929   ALBUMIN 3.1 (L) 05/04/2021 0929     PERFORMANCE STATUS (ECOG) : 1 - Symptomatic but completely ambulatory   Physical Exam General: NAD Cardiovascular: regular rate and rhythm Pulmonary: clear ant fields Abdomen: soft, nontender, + bowel sounds Extremities: bilateral pedal edema, some hand edema Skin:warm, dry, intact  Neurological: Weakness but otherwise nonfocal  IMPRESSION:  Patient seen during infusion. Is resting in recliner. No acute distress noted.   We discussed at length current pain regimen, appetite, and overall functional state. She saw her Oncology team today also and it was decided to hold her [place]. She is hopeful this will allow her the opportunity to feel better with plans to further discuss future plans.   We reviewed her current regimen. She is currently taking MS Contin 30 mg every 12 hours and  Dilaudid 2-4 mg every 4-6 hours as needed. She reports last dose this morning around 6 or 7. Rates her pain 2 out of 10 expressing appreciation. She was having some increased pain over the past 2-3 days but feels it is much improved with the adjustments made. We discussed continuing with current plan and to continue closely evaluating.   She is tearful expressing her appreciation in her increased appetite. She is tolerating marinol with good effects. She is up 3.6 lbs. Saja states she used to wonder if she would ever know what it felt like to be hungry again and want to eat and now this is happening. Encouraged to continue doing well and include  protein shakes for additional support.   Constipation is managed with no concerns at this time.   PLAN: Dilaudid 2-4mg  every 4-6 hours as needed for breakthrough pain MS Contin 30 mg twice daily as previously prescribed.  Senna and daily Miralax for constipation Marinol  for appetite stimulant. Education on use of protein shakes, protein rich foods, and small frequent meals vs large meals. This has been working well for her with no side effects. Weight is up 3.4lbs I will plan to see her back in 2 weeks in collaboration with her other oncology appointments.    Patient expressed understanding and was in agreement with this plan. She also understands that She can call the clinic at any time with any questions, concerns, or complaints.     Time Total: 25 min.   Visit consisted of counseling and education dealing with the complex and emotionally intense issues of symptom management and palliative care in the setting of serious and potentially life-threatening illness.Greater than 50%  of this time was spent counseling and coordinating care related to the above assessment and plan.  Signed by: Alda Lea, AGPCNP-BC Palliative Medicine Team

## 2021-05-04 NOTE — Telephone Encounter (Signed)
Mrs. Fritcher called to inform me that the increase in her pain medication wasn't seeming to help her pain. I informed [name], NP who stated that Mrs. Carlton could take 4mg  (2 tablets) of Dialudid every 4hrs as needed for pain. I told her that we would evaluate the effectiveness at her scheduled appt with [place]. We also went over the times and dates of her next appts at the [facility]. Understanding verbalized. All questions answered.

## 2021-05-05 ENCOUNTER — Telehealth: Payer: Self-pay | Admitting: Radiation Oncology

## 2021-05-05 NOTE — Telephone Encounter (Signed)
Called pt to schedule a consultation w. Dr. Sondra Come. No answer, LVM for a return call.

## 2021-05-08 DIAGNOSIS — C3491 Malignant neoplasm of unspecified part of right bronchus or lung: Secondary | ICD-10-CM | POA: Diagnosis not present

## 2021-05-09 ENCOUNTER — Other Ambulatory Visit: Payer: Self-pay

## 2021-05-10 ENCOUNTER — Telehealth: Payer: Self-pay

## 2021-05-10 ENCOUNTER — Other Ambulatory Visit: Payer: Self-pay | Admitting: Nurse Practitioner

## 2021-05-10 MED ORDER — HYDROMORPHONE HCL 4 MG PO TABS: 4.0000 mg | ORAL_TABLET | Freq: Four times a day (QID) | ORAL | 0 refills | Status: DC | PRN

## 2021-05-10 NOTE — Telephone Encounter (Signed)
Pts husband called requesting a refill of MS Contin 30mg .

## 2021-05-11 ENCOUNTER — Encounter: Payer: Self-pay | Admitting: Physician Assistant

## 2021-05-11 NOTE — Progress Notes (Signed)
Location of tumor and Histology per Pathology Report:  large cavitary right upper lobe lung mass in addition to mediastinal lymphadenopathy and metastatic disease to the bones as well as right adrenal gland.   Biopsy: stage IV (T3, N2, M1 C) non-small cell lung cancer with unknown histologic subtype but likely to be squamous cell carcinoma based on the morphology of the right upper lobe cavitary mass.  11/17/2020 Bronchial Washing (ThinPrep, cell block): -Rare atypical cells  Subcarinal Lymph Node; FNA (smears, cell block): Lymphoid material, favor benign lymph node sampling. No evidence of malignancy identified.   Right Paratracheal Lymph Node; FNA (smears, cell block): Non-small cell carcinoma.  Past/Anticipated interventions by surgeon, if any:  11/17/2020   Physician: Milus Glazier, MD  Procedure: Bronchoscopy w/EBUS  Past/Anticipated interventions by medical oncology, if any: Dr Julien Nordmann PRIOR THERAPY:  Palliative radiotherapy to the painful bone lesions under the care of Dr. Sondra Come.   CURRENT THERAPY: 1) Palliative systemic chemotherapy with carboplatin for AUC of 5, Alimta 500 Mg/M2 and Keytruda 200 Mg IV every 3 weeks.  First dose of treatment on December 29, 2020. Status post 6 cycles.  Starting from cycle #5, the patient is going to start maintenance Alimta and Keytruda. Keytruda discontinued from cycle #7 due to myalgias.  2)Zometa every 6 weeks starting on 02/10/21    Pain issues, if any:  yes, 8/10 generalized joint and muscle painintermittent and aching  SAFETY ISSUES: Prior radiation? Palliative radiotherapy to the painful bone lesions  Treatment Dates: 4 treatment sessions on: 12/21/2020, 12/22/2020, 12/23/2020, 12/24/2020 Site/Dose: Chest_Rt - 30.00 of 30.00Gy, 10 of 10 fractions delivered; 3.00 Gy/Fx  Pacemaker/ICD? no Possible current pregnancy? no Is the patient on methotrexate? no  Current Complaints / other details:  possible DVT to right calf     Vitals:    05/19/21 1518  BP: 123/61  Pulse: 66  Resp: 18  Temp: 98.7 F (37.1 C)  TempSrc: Oral  SpO2: 100%  Weight: 91 lb 4 oz (41.4 kg)  Height: 5\' 5"  (1.651 m)

## 2021-05-11 NOTE — Telephone Encounter (Signed)
I called Kristi Jennings to inform her that her new prescription of the Dilaudid is 4mg  tablets-therefore she would need to only take 1 tablet at a time rather than 2 tablets of 2mg  as previously prescribed. Understanding verbalized. All questions answered.

## 2021-05-12 ENCOUNTER — Other Ambulatory Visit: Payer: Self-pay | Admitting: Nurse Practitioner

## 2021-05-12 DIAGNOSIS — C7951 Secondary malignant neoplasm of bone: Secondary | ICD-10-CM

## 2021-05-12 DIAGNOSIS — Z515 Encounter for palliative care: Secondary | ICD-10-CM | POA: Insufficient documentation

## 2021-05-12 MED ORDER — MORPHINE SULFATE ER 15 MG PO TBCR: 30.0000 mg | EXTENDED_RELEASE_TABLET | Freq: Two times a day (BID) | ORAL | 0 refills | Status: DC

## 2021-05-14 DIAGNOSIS — C3491 Malignant neoplasm of unspecified part of right bronchus or lung: Secondary | ICD-10-CM | POA: Diagnosis not present

## 2021-05-17 ENCOUNTER — Telehealth: Payer: Self-pay

## 2021-05-17 NOTE — Telephone Encounter (Signed)
Kristi Jennings called for clarification of Kristi Jennings appt tomorrow. I informed him that her appt is at 11:00am tomorrow. I asked if they would want to see [place] Thursday instead since they have an appt with Dr. Sondra Come, but he said they would be fine coming tomorrow as they want to discuss her pain management, stated that her pain is getting worse. Therefore, our office will see Kristi Jennings tomorrow, 12/28 at 11:00am. Understanding verbalized. All questions answered.

## 2021-05-18 ENCOUNTER — Inpatient Hospital Stay (HOSPITAL_BASED_OUTPATIENT_CLINIC_OR_DEPARTMENT_OTHER): Payer: Medicare HMO | Admitting: Physician Assistant

## 2021-05-18 ENCOUNTER — Telehealth: Payer: Self-pay

## 2021-05-18 ENCOUNTER — Inpatient Hospital Stay (HOSPITAL_BASED_OUTPATIENT_CLINIC_OR_DEPARTMENT_OTHER): Payer: Medicare HMO | Admitting: Nurse Practitioner

## 2021-05-18 ENCOUNTER — Other Ambulatory Visit: Payer: Self-pay

## 2021-05-18 VITALS — BP 104/50 | HR 57 | Temp 97.7°F | Resp 18 | Ht 65.0 in | Wt 93.5 lb

## 2021-05-18 DIAGNOSIS — Z5111 Encounter for antineoplastic chemotherapy: Secondary | ICD-10-CM | POA: Diagnosis not present

## 2021-05-18 DIAGNOSIS — Z515 Encounter for palliative care: Secondary | ICD-10-CM | POA: Diagnosis not present

## 2021-05-18 DIAGNOSIS — R238 Other skin changes: Secondary | ICD-10-CM

## 2021-05-18 DIAGNOSIS — G893 Neoplasm related pain (acute) (chronic): Secondary | ICD-10-CM

## 2021-05-18 DIAGNOSIS — R590 Localized enlarged lymph nodes: Secondary | ICD-10-CM | POA: Diagnosis not present

## 2021-05-18 DIAGNOSIS — L299 Pruritus, unspecified: Secondary | ICD-10-CM

## 2021-05-18 DIAGNOSIS — C7951 Secondary malignant neoplasm of bone: Secondary | ICD-10-CM | POA: Diagnosis not present

## 2021-05-18 DIAGNOSIS — R600 Localized edema: Secondary | ICD-10-CM | POA: Diagnosis not present

## 2021-05-18 DIAGNOSIS — R5383 Other fatigue: Secondary | ICD-10-CM | POA: Diagnosis not present

## 2021-05-18 DIAGNOSIS — I2510 Atherosclerotic heart disease of native coronary artery without angina pectoris: Secondary | ICD-10-CM | POA: Diagnosis not present

## 2021-05-18 DIAGNOSIS — M7989 Other specified soft tissue disorders: Secondary | ICD-10-CM | POA: Diagnosis not present

## 2021-05-18 DIAGNOSIS — I700 Atherosclerosis of aorta: Secondary | ICD-10-CM | POA: Diagnosis not present

## 2021-05-18 DIAGNOSIS — C7971 Secondary malignant neoplasm of right adrenal gland: Secondary | ICD-10-CM | POA: Diagnosis not present

## 2021-05-18 DIAGNOSIS — I10 Essential (primary) hypertension: Secondary | ICD-10-CM | POA: Diagnosis not present

## 2021-05-18 DIAGNOSIS — C3411 Malignant neoplasm of upper lobe, right bronchus or lung: Secondary | ICD-10-CM | POA: Diagnosis not present

## 2021-05-18 DIAGNOSIS — K5900 Constipation, unspecified: Secondary | ICD-10-CM | POA: Diagnosis not present

## 2021-05-18 MED ORDER — HYDROXYZINE HCL 10 MG PO TABS: 10.0000 mg | ORAL_TABLET | Freq: Three times a day (TID) | ORAL | 0 refills | Status: DC | PRN

## 2021-05-18 MED ORDER — DOXYCYCLINE HYCLATE 100 MG PO TABS: 100.0000 mg | ORAL_TABLET | Freq: Two times a day (BID) | ORAL | 0 refills | Status: AC

## 2021-05-18 MED ORDER — HYDROXYZINE HCL 25 MG PO TABS
25.0000 mg | ORAL_TABLET | Freq: Three times a day (TID) | ORAL | Status: DC | PRN
Start: 2021-05-18 — End: 2021-05-18

## 2021-05-18 NOTE — Progress Notes (Signed)
Symptom Management Consult note [place]    Patient Care Team: [name], MD as PCP - General (Internal Medicine) [facility] as Registered Nurse [facility] and Palliative Medicine)    Name of the patient: Kristi Jennings  [id number]  March 23, 1946   Date of visit: 05/18/2021    Chief complaint/ Reason for visit- right leg swelling  Oncology History  Non-small cell carcinoma of lung, stage 4, right ([place])  11/30/2020 Initial Diagnosis   Non-small cell carcinoma of lung, stage 4, right ([place])   11/30/2020 Cancer Staging   Staging form: Lung, AJCC 8th Edition - Clinical: Stage IVB (cT3, cN2, cM1c) - Signed by Curt Bears, MD on 11/30/2020    12/29/2020 -  Chemotherapy   Patient is on Treatment Plan : LUNG CARBOplatin / Pemetrexed / Pembrolizumab q21d Induction x 4 cycles / Maintenance Pemetrexed + Pembrolizumab       Current Therapy: pembrolizumab with last treatment 05/04/21  Interval history-  Robby Pirani. Spagnolo is a 75 yo female with oncologic history of non-small cell carcinoma of lung stage IV presenting to [facility] today with chief complaint of right leg swelling approximately x2 months.  She states the swelling started while she was on Keytruda.  [name] was recently discontinued, last therapy the with [name] was in November.  Spouse is at the bedside and contributes to history.  Patient states she has been seen multiple providers for this since it started.  She noticed some redness on her right shin x1 week ago.  She denies any injury or trauma to the leg.  She states both legs will occasionally swell and gets better with elevation.  She does not eat a high salt diet.  She denies any cardiac history and has not not seen a cardiologist.  She has not been taking any over-the-counter medications for her symptoms.  She denies any associated leg pain.  She denies any fever, chills, numbness, tingling or weakness.     ROS  All other systems are reviewed  and are negative for acute change except as noted in the HPI.    Allergies  Allergen Reactions   Shellfish-Derived Products Hives, Itching and Rash    Other reaction(s): Hives    Augmentin [Amoxicillin-Pot Clavulanate] Nausea And Vomiting     Past Medical History:  Diagnosis Date   Borderline diabetes mellitus    Bronchogenic cancer of right lung (HCC)    Dyslipidemia    Hypertension    Polymyalgia rheumatica ([place])      No past surgical history on file.  Social History   Socioeconomic History   Marital status: Married    Spouse name: Not on file   Number of children: Not on file   Years of education: Not on file   Highest education level: Not on file  Occupational History   Not on file  Tobacco Use   Smoking status: Former    Packs/day: 1.00    Years: 43.00    Pack years: 43.00    Types: Cigarettes    Quit date: 11/23/2008    Years since quitting: 12.4   Smokeless tobacco: Never   Tobacco comments:    quit 2010  Vaping Use   Vaping Use: Never used  Substance and Sexual Activity   Alcohol use: Not Currently   Drug use: Never   Sexual activity: Not on file  Other Topics Concern   Not on file  Social History Narrative   Not on file  Social Determinants of Health   Financial Resource Strain: Not on file  Food Insecurity: Not on file  Transportation Needs: Not on file  Physical Activity: Not on file  Stress: Not on file  Social Connections: Not on file  Intimate Partner Violence: Not on file    Family History  Problem Relation Age of Onset   Heart attack Mother    Heart disease Mother    Colon cancer Father    ALS Brother      Current Outpatient Medications:    doxycycline (VIBRA-TABS) 100 MG tablet, Take 1 tablet (100 mg total) by mouth 2 (two) times daily for 7 days., Disp: 14 tablet, Rfl: 0   acetaminophen (TYLENOL) 650 MG CR tablet, Take 1,300 mg by mouth 3 (three) times daily as needed for pain., Disp: , Rfl:    aspirin EC 81 MG tablet,  Take 81 mg by mouth 4 (four) times a week. Swallow whole., Disp: , Rfl:    atenolol (TENORMIN) 25 MG tablet, Take 12.5 mg by mouth at bedtime., Disp: , Rfl:    diclofenac Sodium (VOLTAREN) 1 % GEL, Apply 4 g topically 4 (four) times daily. (Patient taking differently: Apply 4 g topically 4 (four) times daily as needed (pain).), Disp: 150 g, Rfl: 0   dronabinol (MARINOL) 2.5 MG capsule, Take 1 capsule (2.5 mg total) by mouth 2 (two) times daily before a meal., Disp: 60 capsule, Rfl: 0   ELIQUIS 5 MG TABS tablet, Take 5 mg by mouth 2 (two) times daily., Disp: , Rfl:    escitalopram (LEXAPRO) 20 MG tablet, Take 20 mg by mouth at bedtime., Disp: , Rfl:    folic acid (FOLVITE) 1 MG tablet, TAKE ONE TABLET BY MOUTH ONE TIME DAILY, Disp: 30 tablet, Rfl: 2   HYDROmorphone (DILAUDID) 4 MG tablet, Take 1 tablet (4 mg total) by mouth every 6 (six) hours as needed for severe pain or moderate pain., Disp: 120 tablet, Rfl: 0   hydrOXYzine (ATARAX) 10 MG tablet, Take 1 tablet (10 mg total) by mouth 3 (three) times daily as needed., Disp: 30 tablet, Rfl: 0   losartan (COZAAR) 50 MG tablet, Take 50 mg by mouth daily., Disp: , Rfl:    methylPREDNISolone (MEDROL DOSEPAK) 4 MG TBPK tablet, Use as instructed, Disp: 21 tablet, Rfl: 0   morphine (MS CONTIN) 15 MG 12 hr tablet, Take 2 tablets (30 mg total) by mouth every 12 (twelve) hours., Disp: 60 tablet, Rfl: 0   ondansetron (ZOFRAN-ODT) 4 MG disintegrating tablet, Take 4 mg by mouth 2 (two) times daily as needed for nausea or vomiting. (Patient not taking: Reported on 03/16/2021), Disp: , Rfl:    prochlorperazine (COMPAZINE) 10 MG tablet, Take 1 tablet (10 mg total) by mouth every 6 (six) hours as needed for nausea or vomiting. (Patient not taking: Reported on 03/16/2021), Disp: 30 tablet, Rfl: 0   rosuvastatin (CRESTOR) 10 MG tablet, Take 10 mg by mouth at bedtime., Disp: , Rfl:   PHYSICAL EXAM: ECOG FS:2 - Symptomatic, <50% confined to bed   T: 97.7 F     BP:  104/50   HR: 57   O2: 97% on room air    Resp: 18 Physical Exam Vitals and nursing note reviewed.  Constitutional:      Appearance: She is well-developed. She is not ill-appearing or toxic-appearing.  HENT:     Head: Normocephalic and atraumatic.     Nose: Nose normal.  Eyes:     General: No scleral  icterus.       Right eye: No discharge.        Left eye: No discharge.     Conjunctiva/sclera: Conjunctivae normal.  Neck:     Vascular: No JVD.  Cardiovascular:     Rate and Rhythm: Normal rate and regular rhythm.     Pulses: Normal pulses.          Dorsalis pedis pulses are 2+ on the right side and 2+ on the left side.     Heart sounds: Normal heart sounds.  Pulmonary:     Effort: Pulmonary effort is normal.     Breath sounds: Normal breath sounds.  Abdominal:     General: There is no distension.  Musculoskeletal:        General: Normal range of motion.     Cervical back: Normal range of motion.     Right lower leg: Edema present.     Comments: Approximately 5 x 4 cm oblong area of erythema on right shin with mild induration. No open wound. No bleeding or palpable fluctuance.  Swelling of RLE from mid shin to dorsum of right foot. Nonpitting edema.  Compartments soft in all extremities. Full ROM of bilateral lower extremities.   Feet:     Right foot:     Toenail Condition: Right toenails are abnormally thick.     Left foot:     Toenail Condition: Left toenails are abnormally thick.  Skin:    General: Skin is warm and dry.     Capillary Refill: Capillary refill takes less than 2 seconds.     Comments: Equal tactile temperature in all extremities  Neurological:     Mental Status: She is oriented to person, place, and time.     GCS: GCS eye subscore is 4. GCS verbal subscore is 5. GCS motor subscore is 6.     Comments: Fluent speech, no facial droop.  Psychiatric:        Behavior: Behavior normal.       LABORATORY DATA: I have reviewed the data as listed CBC Latest  Ref Rng & Units 05/04/2021 04/13/2021 03/24/2021  WBC 4.0 - 10.5 K/uL 5.8 5.7 3.4(L)  Hemoglobin 12.0 - 15.0 g/dL 9.8(L) 11.0(L) 11.2(L)  Hematocrit 36.0 - 46.0 % 30.1(L) 33.3(L) 34.1(L)  Platelets 150 - 400 K/uL 285 311 186     CMP Latest Ref Rng & Units 05/04/2021 04/13/2021 03/24/2021  Glucose 70 - 99 mg/dL 120(H) 112(H) 89  BUN 8 - 23 mg/dL 8 6(L) 7(L)  Creatinine 0.44 - 1.00 mg/dL 0.57 0.62 0.60  Sodium 135 - 145 mmol/L 135 135 139  Potassium 3.5 - 5.1 mmol/L 3.9 3.9 3.6  Chloride 98 - 111 mmol/L 100 100 103  CO2 22 - 32 mmol/L 26 25 27   Calcium 8.9 - 10.3 mg/dL 8.4(L) 8.9 8.7(L)  Total Protein 6.5 - 8.1 g/dL 6.4(L) 6.5 6.6  Total Bilirubin 0.3 - 1.2 mg/dL 0.5 0.4 0.6  Alkaline Phos 38 - 126 U/L 56 65 80  AST 15 - 41 U/L 17 20 20   ALT 0 - 44 U/L 6 9 11        RADIOGRAPHIC STUDIES: I have personally reviewed the radiological images as listed and agreed with the findings in the report. No images are attached to the encounter. CT Chest W Contrast  Result Date: 05/02/2021 CLINICAL DATA:  75 year old female with history of non-small cell lung cancer status post chemotherapy and radiation therapy. Ongoing Keytruda therapy. Radiation therapy is now complete.  Follow-up study. EXAM: CT CHEST, ABDOMEN, AND PELVIS WITH CONTRAST TECHNIQUE: Multidetector CT imaging of the chest, abdomen and pelvis was performed following the standard protocol during bolus administration of intravenous contrast. CONTRAST:  89mL OMNIPAQUE IOHEXOL 350 MG/ML SOLN COMPARISON:  CT the chest, abdomen and pelvis 02/19/2021. FINDINGS: CT CHEST FINDINGS Cardiovascular: Heart size is normal. There is no significant pericardial fluid, thickening or pericardial calcification. There is aortic atherosclerosis, as well as atherosclerosis of the great vessels of the mediastinum and the coronary arteries, including calcified atherosclerotic plaque in the left main, left anterior descending, left circumflex and right coronary  arteries. Ectasia of ascending thoracic aorta (4.2 cm in diameter). Mediastinum/Nodes: No pathologically enlarged mediastinal or hilar lymph nodes. Esophagus is unremarkable in appearance. No axillary lymphadenopathy. Lungs/Pleura: Treated partially cavitary mass in the right upper lobe is again noted (axial image 26 of series 4 and coronal image 51 of series 5), slightly more prominent in appearance than the prior examination, currently measuring 6.2 x 4.3 x 5.9 cm (previously 5.4 x 3.5 x 6.0 cm). Areas of septal thickening and architectural distortion surrounding the right upper lobe mass are presumably related to evolving postradiation changes. This mass abuts the major fissure, and a small portion of the lesion may transgress the fissure into the superior segment of the right lower lobe (axial image 24 of series 4). No definite new suspicious appearing pulmonary nodules or masses are otherwise noted. No acute consolidative airspace disease. No pleural effusions. Musculoskeletal: Multiple aggressive appearing osseous lesions are again noted, compatible with widespread metastatic disease to the bones, most notable a destructive lesion in the anterolateral aspect of the right third rib. There is also a lytic lesion with chronic pathologic fracture in the upper sternum, similar to the prior examination. Chronic compression fractures of T4, T8 and T12 are again noted (T8 and T12 are likely pathologic fractures), most severe at T12 where there is 80% loss of anterior vertebral body height. CT ABDOMEN PELVIS FINDINGS Hepatobiliary: Previously noted hypovascular lesion in segment 7 of the liver is slightly decreased compared to the prior study, currently measuring 1.6 x 1.4 cm (previously 2.3 x 2.0 cm), with persistent surrounding perfusion anomaly. No new hepatic lesions are otherwise noted. No intra or extrahepatic biliary ductal dilatation. Gallbladder is normal in appearance. Pancreas: No pancreatic mass. No  pancreatic ductal dilatation. No pancreatic or peripancreatic fluid collections or inflammatory changes. Spleen: Unremarkable. Adrenals/Urinary Tract: Previously noted right adrenal nodule appears more prominent than the prior study, currently measuring 2.7 x 2.0 cm (previously 2.5 x 1.7 cm). Left adrenal gland and bilateral kidneys are normal in appearance. No hydroureteronephrosis. Urinary bladder is nearly completely decompressed, but otherwise unremarkable in appearance. Stomach/Bowel: The appearance of the stomach is normal. There is no pathologic dilatation of small bowel or colon. The appendix is not confidently identified and may be surgically absent. Regardless, there are no inflammatory changes noted adjacent to the cecum to suggest the presence of an acute appendicitis at this time. Vascular/Lymphatic: Aortic atherosclerosis, without evidence of aneurysm or dissection in the abdominal or pelvic vasculature. No lymphadenopathy noted in the abdomen or pelvis. Reproductive: Uterus and ovaries are atrophic. Other: No significant volume of ascites.  No pneumoperitoneum. Musculoskeletal: Multiple lucent regions are again noted in the visualized portions of the proximal femurs bilaterally, likely to reflect metastatic lesions. IMPRESSION: 1. Today's study demonstrates a mixed response to therapy. Specifically, the primary lesion in the right upper lobe appears larger than the prior examination, as does the right  adrenal metastasis. However, the metastatic lesion in segment 7 of the liver shows regression compared to the prior examination. Widespread metastatic disease to the bones appear grossly similar to the prior study. No new metastatic disease is noted elsewhere in the chest, abdomen or pelvis. 2. Aortic atherosclerosis, in addition to left main and 3 vessel coronary artery disease. Assessment for potential risk factor modification, dietary therapy or pharmacologic therapy may be warranted, if clinically  indicated. 3. Ectasia of the ascending thoracic aorta (4.2 cm in diameter). Attention at time of routine follow-up examinations is recommended to ensure stability of this finding. 4. Additional incidental findings, as above. Electronically Signed   By: Vinnie Langton M.D.   On: 05/02/2021 10:23   CT Abdomen Pelvis W Contrast  Result Date: 05/02/2021 CLINICAL DATA:  75 year old female with history of non-small cell lung cancer status post chemotherapy and radiation therapy. Ongoing Keytruda therapy. Radiation therapy is now complete. Follow-up study. EXAM: CT CHEST, ABDOMEN, AND PELVIS WITH CONTRAST TECHNIQUE: Multidetector CT imaging of the chest, abdomen and pelvis was performed following the standard protocol during bolus administration of intravenous contrast. CONTRAST:  66mL OMNIPAQUE IOHEXOL 350 MG/ML SOLN COMPARISON:  CT the chest, abdomen and pelvis 02/19/2021. FINDINGS: CT CHEST FINDINGS Cardiovascular: Heart size is normal. There is no significant pericardial fluid, thickening or pericardial calcification. There is aortic atherosclerosis, as well as atherosclerosis of the great vessels of the mediastinum and the coronary arteries, including calcified atherosclerotic plaque in the left main, left anterior descending, left circumflex and right coronary arteries. Ectasia of ascending thoracic aorta (4.2 cm in diameter). Mediastinum/Nodes: No pathologically enlarged mediastinal or hilar lymph nodes. Esophagus is unremarkable in appearance. No axillary lymphadenopathy. Lungs/Pleura: Treated partially cavitary mass in the right upper lobe is again noted (axial image 26 of series 4 and coronal image 51 of series 5), slightly more prominent in appearance than the prior examination, currently measuring 6.2 x 4.3 x 5.9 cm (previously 5.4 x 3.5 x 6.0 cm). Areas of septal thickening and architectural distortion surrounding the right upper lobe mass are presumably related to evolving postradiation changes. This  mass abuts the major fissure, and a small portion of the lesion may transgress the fissure into the superior segment of the right lower lobe (axial image 24 of series 4). No definite new suspicious appearing pulmonary nodules or masses are otherwise noted. No acute consolidative airspace disease. No pleural effusions. Musculoskeletal: Multiple aggressive appearing osseous lesions are again noted, compatible with widespread metastatic disease to the bones, most notable a destructive lesion in the anterolateral aspect of the right third rib. There is also a lytic lesion with chronic pathologic fracture in the upper sternum, similar to the prior examination. Chronic compression fractures of T4, T8 and T12 are again noted (T8 and T12 are likely pathologic fractures), most severe at T12 where there is 80% loss of anterior vertebral body height. CT ABDOMEN PELVIS FINDINGS Hepatobiliary: Previously noted hypovascular lesion in segment 7 of the liver is slightly decreased compared to the prior study, currently measuring 1.6 x 1.4 cm (previously 2.3 x 2.0 cm), with persistent surrounding perfusion anomaly. No new hepatic lesions are otherwise noted. No intra or extrahepatic biliary ductal dilatation. Gallbladder is normal in appearance. Pancreas: No pancreatic mass. No pancreatic ductal dilatation. No pancreatic or peripancreatic fluid collections or inflammatory changes. Spleen: Unremarkable. Adrenals/Urinary Tract: Previously noted right adrenal nodule appears more prominent than the prior study, currently measuring 2.7 x 2.0 cm (previously 2.5 x 1.7 cm). Left adrenal gland  and bilateral kidneys are normal in appearance. No hydroureteronephrosis. Urinary bladder is nearly completely decompressed, but otherwise unremarkable in appearance. Stomach/Bowel: The appearance of the stomach is normal. There is no pathologic dilatation of small bowel or colon. The appendix is not confidently identified and may be surgically absent.  Regardless, there are no inflammatory changes noted adjacent to the cecum to suggest the presence of an acute appendicitis at this time. Vascular/Lymphatic: Aortic atherosclerosis, without evidence of aneurysm or dissection in the abdominal or pelvic vasculature. No lymphadenopathy noted in the abdomen or pelvis. Reproductive: Uterus and ovaries are atrophic. Other: No significant volume of ascites.  No pneumoperitoneum. Musculoskeletal: Multiple lucent regions are again noted in the visualized portions of the proximal femurs bilaterally, likely to reflect metastatic lesions. IMPRESSION: 1. Today's study demonstrates a mixed response to therapy. Specifically, the primary lesion in the right upper lobe appears larger than the prior examination, as does the right adrenal metastasis. However, the metastatic lesion in segment 7 of the liver shows regression compared to the prior examination. Widespread metastatic disease to the bones appear grossly similar to the prior study. No new metastatic disease is noted elsewhere in the chest, abdomen or pelvis. 2. Aortic atherosclerosis, in addition to left main and 3 vessel coronary artery disease. Assessment for potential risk factor modification, dietary therapy or pharmacologic therapy may be warranted, if clinically indicated. 3. Ectasia of the ascending thoracic aorta (4.2 cm in diameter). Attention at time of routine follow-up examinations is recommended to ensure stability of this finding. 4. Additional incidental findings, as above. Electronically Signed   By: Vinnie Langton M.D.   On: 05/02/2021 10:23     ASSESSMENT & PLAN: Patient is a 75 y.o. female with history of non-small cell lung cancer followed by oncologist Dr. Earlie Server.  #) Right leg redness and swelling-patient nontoxic-appearing.  Vital signs are stable.  She has erythema with induration on her right shin and swelling of right lower extremity.  Swelling has been ongoing times several months.  Patient  is currently on Eliquis for history of PE however has not had an ultrasound to rule out DVT.  This has been ordered and scheduled for tomorrow.  We will also do trial of doxycycline to rule out infectious cause of erythema and induration on the leg.  Patient and spouse agreeable with plan of care.  Strict return precautions were discussed.   Visit Diagnosis: 1. Redness and swelling of lower leg      No orders of the defined types were placed in this encounter.   All questions were answered. The patient knows to call the clinic with any problems, questions or concerns. No barriers to learning was detected.  I have spent a total of 30 minutes minutes of face-to-face and non-face-to-face time, preparing to see the patient, obtaining and/or reviewing separately obtained history, performing a medically appropriate examination, counseling and educating the patient, ordering tests,  documenting clinical information in the electronic health record, and care coordination.     Thank you for allowing me to participate in the care of this patient.    Barrie Folk, PA-C Department of Hematology/Oncology [facility] Phone: [phone]  [phone]    05/18/2021 2:33 PM

## 2021-05-18 NOTE — Progress Notes (Signed)
°  Outpatient Palliative Care  ([phone]) [phone] ________________________________ Nursing Assessment:  Name: CARL BLEECKER        MRN: [id number]  Date of Service: 05/18/2021 DOB: [birth year] Visit Type: Follow up-pain management  Support at Visit: [name], husband  Review of Systems: General: Mrs. Desaulniers reports increased weakness and fatigue. States she has to periodically nap throughout the day.  Neuro: None Cardiac: None Pulmonary: Reports SHOB with activity.   GI: Reports a decrease in appetite-25% normal in conjunction with increased pain.  Drinking Premier Protein shakes.  Reports improvement in constipation-BM every other day to 2 days.  GU: None Integumentary: Itching to scalp, back, and neck. Swelling to feet and ankles with redness on the R leg. Reports decrease in redness with cream.  Swelling in hands and fingers.  Psychological: Mrs. Botelho reports anxiety with her situation as a whole and states that she feels "overwhelmed". She states she also feels sad when thinking about her decline and not being able to function the way she could. Emotional listening and support provided.    Medication Changes/Additions: None    Pain Review: Scale of 0-10: 7-8 Location: generalized-specifically knees and shoulders Description: aching, sore Frequency: constant Relief: "some" from medications    Social Review: Living Situation: lives at home with [name], husband  Support: [name], husband    Falls in the last 3 months? None  Assistive Device Use? Using walker at home now due to increased weakness and fear of falling.   Functional Status: Min-Mod assist. Reports husband helps with bathing  ACP Form Review:  Family/Patient Concerns: Mr. And Mrs. Somoza's main concerns are pain control, swelling and redness in R leg, and expectations of disease. [name], NP notified.   Provider notified of assessment and patient/family concerns. Patient instructed to call with any questions or  concerns.

## 2021-05-18 NOTE — Telephone Encounter (Signed)
I called Mrs. Kristi Jennings to inform her that our office was able to create an appt for her with Velora Heckler on January 30th at 12:40 to see a new provider. Understanding verbalized. All questions answered. Advised to call with additional questions/concerns.

## 2021-05-18 NOTE — Progress Notes (Signed)
[facility]  Telephone:([phone]) [phone] Fax:(336) [phone]   Name: Kristi Jennings Date: 05/18/2021 MRN: [id number]  DOB: 1945-08-30  Patient Care Team: [name], MD as PCP - General (Internal Medicine) [place] Registered Nurse [facility] and Palliative Medicine)    INTERVAL HISTORY: Kristi Jennings is a 75 y.o. 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 Palliative ask to see for symptom management.  SOCIAL HISTORY:     reports that she quit smoking about 12 years ago. Her smoking use included cigarettes. She has a 43.00 pack-year smoking history. She has never used smokeless tobacco. She reports that she does not currently use alcohol. She reports that she does not use drugs.  ADVANCE DIRECTIVES:  None on file   CODE STATUS:   PAST MEDICAL HISTORY: Past Medical History:  Diagnosis Date   Borderline diabetes mellitus    Bronchogenic cancer of right lung (HCC)    Dyslipidemia    Hypertension    Polymyalgia rheumatica (HCC)      HEMATOLOGY/ONCOLOGY HISTORY:  Oncology History  Non-small cell carcinoma of lung, stage 4, right ([place])  11/30/2020 Initial Diagnosis   Non-small cell carcinoma of lung, stage 4, right ([place])   11/30/2020 Cancer Staging   Staging form: Lung, AJCC 8th Edition - Clinical: Stage IVB (cT3, cN2, cM1c) - Signed by Curt Bears, MD on 11/30/2020    12/29/2020 -  Chemotherapy   Patient is on Treatment Plan : LUNG CARBOplatin / Pemetrexed / Pembrolizumab q21d Induction x 4 cycles / Maintenance Pemetrexed + Pembrolizumab       ALLERGIES:  is allergic to shellfish-derived products and augmentin [amoxicillin-pot clavulanate].  MEDICATIONS:  Current Outpatient Medications  Medication Sig Dispense Refill   acetaminophen (TYLENOL) 650 MG CR tablet Take 1,300 mg by mouth 3 (three) times daily  as needed for pain.     aspirin EC 81 MG tablet Take 81 mg by mouth 4 (four) times a week. Swallow whole.     atenolol (TENORMIN) 25 MG tablet Take 12.5 mg by mouth at bedtime.     diclofenac Sodium (VOLTAREN) 1 % GEL Apply 4 g topically 4 (four) times daily. (Patient taking differently: Apply 4 g topically 4 (four) times daily as needed (pain).) 150 g 0   dronabinol (MARINOL) 2.5 MG capsule Take 1 capsule (2.5 mg total) by mouth 2 (two) times daily before a meal. 60 capsule 0   ELIQUIS 5 MG TABS tablet Take 5 mg by mouth 2 (two) times daily.     escitalopram (LEXAPRO) 20 MG tablet Take 20 mg by mouth at bedtime.     folic acid (FOLVITE) 1 MG tablet TAKE ONE TABLET BY MOUTH ONE TIME DAILY 30 tablet 2   HYDROmorphone (DILAUDID) 4 MG tablet Take 1 tablet (4 mg total) by mouth every 6 (six) hours as needed for severe pain or moderate pain. 120 tablet 0   losartan (COZAAR) 50 MG tablet Take 50 mg by mouth daily.     methylPREDNISolone (MEDROL DOSEPAK) 4 MG TBPK tablet Use as instructed 21 tablet 0   morphine (MS CONTIN) 15 MG 12 hr tablet Take 2 tablets (30 mg total) by mouth every 12 (twelve) hours. 60 tablet 0   ondansetron (ZOFRAN-ODT) 4 MG disintegrating tablet Take 4 mg by mouth 2 (two) times daily as needed for nausea or vomiting. (Patient not taking: Reported on  03/16/2021)     prochlorperazine (COMPAZINE) 10 MG tablet Take 1 tablet (10 mg total) by mouth every 6 (six) hours as needed for nausea or vomiting. (Patient not taking: Reported on 03/16/2021) 30 tablet 0   rosuvastatin (CRESTOR) 10 MG tablet Take 10 mg by mouth at bedtime.     No current facility-administered medications for this visit.    VITAL SIGNS: BP (!) 104/50 (BP Location: Left Arm, Patient Position: Sitting)    Pulse (!) 57    Temp 97.7 F (36.5 C) (Oral)    Resp 18    Ht 5\' 5"  (1.651 m)    Wt 93 lb 8 oz (42.4 kg)    SpO2 97%    BMI 15.56 kg/m  Filed Weights   05/18/21 1121  Weight: 93 lb 8 oz (42.4 kg)    Estimated  body mass index is 15.56 kg/m as calculated from the following:   Height as of this encounter: 5\' 5"  (1.651 m).   Weight as of this encounter: 93 lb 8 oz (42.4 kg).  LABS: CBC:    Component Value Date/Time   WBC 5.8 05/04/2021 0929   WBC 2.7 (L) 01/05/2021 0543   HGB 9.8 (L) 05/04/2021 0929   HCT 30.1 (L) 05/04/2021 0929   PLT 285 05/04/2021 0929   MCV 96.2 05/04/2021 0929   NEUTROABS 3.7 05/04/2021 0929   LYMPHSABS 0.7 05/04/2021 0929   MONOABS 1.0 05/04/2021 0929   EOSABS 0.4 05/04/2021 0929   BASOSABS 0.0 05/04/2021 0929   Comprehensive Metabolic Panel:    Component Value Date/Time   NA 135 05/04/2021 0929   K 3.9 05/04/2021 0929   CL 100 05/04/2021 0929   CO2 26 05/04/2021 0929   BUN 8 05/04/2021 0929   CREATININE 0.57 05/04/2021 0929   GLUCOSE 120 (H) 05/04/2021 0929   CALCIUM 8.4 (L) 05/04/2021 0929   AST 17 05/04/2021 0929   ALT 6 05/04/2021 0929   ALKPHOS 56 05/04/2021 0929   BILITOT 0.5 05/04/2021 0929   PROT 6.4 (L) 05/04/2021 0929   ALBUMIN 3.1 (L) 05/04/2021 0929     PERFORMANCE STATUS (ECOG) : 1 - Symptomatic but completely ambulatory   Physical Exam General: NAD Cardiovascular: regular rate and rhythm, bilateral pedal pulses 2+ Pulmonary: clear ant fields, normal breathing pattern, expiratory wheeze with exertion  Abdomen: soft, nontender, + bowel sounds Extremities: bilateral ankle edema, non-pitting, right lower extremity erythema, firm, non-painful  Skin: dry, itchy per patient  Neurological: Weakness but otherwise nonfocal  IMPRESSION: Kristi Jennings presents to clinic today with the support of her husband for follow-up. No acute distress noted. Continues to endorse weakness and pain. Appetite somewhat improving.   Pain Kristi Jennings continues to complain of some pain or discomfort. Unsure if it is more muscular pain vs joint pain. Does gain some relief when taking her breakthrough medication. Is tolerating MS Contin 30 mg twice daily with  Dilaudid 4mg  for breakthrough. Reports she is able to ambulate around the home with a walker. Education provided on the role of her pain regimen in addition to the team remaining hopeful that symptoms will begin to resolve in the the setting of Keytruda being held. She and husband verbalized understanding.   Constipation Reports no concerns with bowel movement. Is taking Senna and Miralax to aid in relief.   Anxiety Kristi Jennings does endorse some anxiety over her overall medical condition. She is frustrated by her sudden decline in quality of life after doing so well for the majority of  her treatments. She and her husband are remaining hopeful that things will improve soon.   Fatigue Although she is able to ambulate around the home she has little energy to do much. Requires rest breaks and will nap throughout the day. She does endorse some of her fatigue most likely in conjunction with her decreased appetite and protein intake.   Decreased appetite/Weight loss She feels that her appetite is slowly improving. Fortunately she has not loss any more weight and previous visit had actually gained. States appetite waxes and wanes. Kristi Jennings states he tries to make her milkshakes as we previously discussed using protein shakes to offer some calories and protein. She does try to drink 1-2 premier protein shakes each day. Ongoing discussions regarding small frequent meals and eating foods that she craves when she has hunger desire.   Lower extremity leg swelling On exam patient has bilateral ankle and hand edema. She also has some erythema, firmness to right lower extremity. States this has been ongoing and has been applying cream. Denies pain. We discussed following up with her PCP for further management. I inquired about an established relationship with a Cardiologist however denies a provider. They are concerned with length of time available to see their PCP and would like to transition to a [facility] provider if  feasible. Recommended scheduling a follow-up for ongoing evaluation and management of swelling. I did personally speak with Dr. Vista Lawman office @[facility]  who confirmed appointment on 06/15/21. I also contacted [place]na [place] @ [facility] to establish new patient appointment however earliest available is 06/20/21. Per office patient has been placed on their cancellation list and will be contacted if a sooner appointment becomes available.   I consulted with [place], [place] and [place], [place] regarding lower extremity erythema and concerns for questionable cellulitis or other vascular involvement. Kristi Lema, PA to see in symptom management, which patient and husband agreed upon.   Dry skin  Kristi Jennings is complaining of ongoing dry and itching skin. This is not new but has been a concern for sometime. We discussed possibility of this being medication related vs Keytruda related. She is using skin cream which is helpful. Recommended Cortisone cream use and also provided education on atarax for itching vs benadryl.   I discussed the importance of continued conversation with family and their medical providers regarding overall plan of care and treatment options, ensuring decisions are within the context of the patients values and GOCs.  PLAN: Patient to follow-up with PCP provider for ongoing lower extremity edema Ongoing symptom management (see above) I will plan to see patient back in 2-3 weeks in collaboration with her other appointments.    Patient expressed understanding and was in agreement with this plan. She also understands that She can call the clinic at any time with any questions, concerns, or complaints.   Time Total: 55 min.   Visit consisted of counseling and education dealing with the complex and emotionally intense issues of symptom management and palliative care in the setting of serious and potentially life-threatening illness.Greater than 50%  of this time was spent counseling and coordinating care related  to the above assessment and plan.  Signed by: Alda Lea, AGPCNP-BC Palliative Medicine Team

## 2021-05-19 ENCOUNTER — Encounter: Payer: Self-pay | Admitting: Physician Assistant

## 2021-05-19 ENCOUNTER — Ambulatory Visit
Admission: RE | Admit: 2021-05-19 | Discharge: 2021-05-19 | Disposition: A | Discharge status: Home / Self Care | Payer: Medicare HMO | Source: Ambulatory Visit | Attending: Radiation Oncology | Admitting: Radiation Oncology

## 2021-05-19 ENCOUNTER — Inpatient Hospital Stay: Payer: Medicare HMO

## 2021-05-19 ENCOUNTER — Ambulatory Visit (HOSPITAL_BASED_OUTPATIENT_CLINIC_OR_DEPARTMENT_OTHER)
Admission: RE | Admit: 2021-05-19 | Discharge: 2021-05-19 | Disposition: A | Discharge status: Home / Self Care | Payer: Medicare HMO | Source: Ambulatory Visit | Attending: Physician Assistant | Admitting: Physician Assistant

## 2021-05-19 ENCOUNTER — Encounter: Payer: Self-pay | Admitting: Radiation Oncology

## 2021-05-19 ENCOUNTER — Inpatient Hospital Stay: Payer: Medicare HMO | Admitting: Physician Assistant

## 2021-05-19 VITALS — BP 123/61 | HR 66 | Temp 98.7°F | Resp 18 | Ht 65.0 in | Wt 91.2 lb

## 2021-05-19 DIAGNOSIS — E785 Hyperlipidemia, unspecified: Secondary | ICD-10-CM | POA: Diagnosis not present

## 2021-05-19 DIAGNOSIS — R238 Other skin changes: Secondary | ICD-10-CM

## 2021-05-19 DIAGNOSIS — C7951 Secondary malignant neoplasm of bone: Secondary | ICD-10-CM | POA: Insufficient documentation

## 2021-05-19 DIAGNOSIS — I10 Essential (primary) hypertension: Secondary | ICD-10-CM | POA: Insufficient documentation

## 2021-05-19 DIAGNOSIS — Z87891 Personal history of nicotine dependence: Secondary | ICD-10-CM | POA: Diagnosis not present

## 2021-05-19 DIAGNOSIS — C7971 Secondary malignant neoplasm of right adrenal gland: Secondary | ICD-10-CM | POA: Diagnosis not present

## 2021-05-19 DIAGNOSIS — C3491 Malignant neoplasm of unspecified part of right bronchus or lung: Secondary | ICD-10-CM

## 2021-05-19 DIAGNOSIS — Z800 Family history of malignant neoplasm of digestive organs: Secondary | ICD-10-CM | POA: Insufficient documentation

## 2021-05-19 DIAGNOSIS — Z79899 Other long term (current) drug therapy: Secondary | ICD-10-CM | POA: Insufficient documentation

## 2021-05-19 DIAGNOSIS — C787 Secondary malignant neoplasm of liver and intrahepatic bile duct: Secondary | ICD-10-CM | POA: Diagnosis not present

## 2021-05-19 DIAGNOSIS — C3411 Malignant neoplasm of upper lobe, right bronchus or lung: Secondary | ICD-10-CM | POA: Insufficient documentation

## 2021-05-19 DIAGNOSIS — I2510 Atherosclerotic heart disease of native coronary artery without angina pectoris: Secondary | ICD-10-CM | POA: Insufficient documentation

## 2021-05-19 DIAGNOSIS — M7989 Other specified soft tissue disorders: Secondary | ICD-10-CM | POA: Insufficient documentation

## 2021-05-19 DIAGNOSIS — M353 Polymyalgia rheumatica: Secondary | ICD-10-CM | POA: Diagnosis not present

## 2021-05-19 DIAGNOSIS — I700 Atherosclerosis of aorta: Secondary | ICD-10-CM | POA: Diagnosis not present

## 2021-05-19 DIAGNOSIS — R630 Anorexia: Secondary | ICD-10-CM | POA: Insufficient documentation

## 2021-05-19 DIAGNOSIS — Z7901 Long term (current) use of anticoagulants: Secondary | ICD-10-CM | POA: Diagnosis not present

## 2021-05-19 NOTE — Progress Notes (Signed)
See MD note for nursing evaluation. °

## 2021-05-19 NOTE — Progress Notes (Signed)
Right lower extremity venous duplex completed. Refer to "CV Proc" under chart review to view preliminary results.  Preliminary results discussed with Sherol Dade, PA-C.  05/19/2021 2:50 PM Kelby Aline., MHA, RVT, RDCS, RDMS

## 2021-05-19 NOTE — Progress Notes (Signed)
Radiation Oncology         ([phone]) [phone] ________________________________  Outpatient Re-Consultation  Name: Kristi Jennings MRN: [id number]  Date: 05/19/2021  DOB: January 29, 1946  EX:NTZGYF, Valaria Good., MD  Heilingoetter, [name]   REFERRING PHYSICIAN: Heilingoetter, [name]  DIAGNOSIS: The primary encounter diagnosis was Non-small cell carcinoma of lung, stage 4, right ([place]). A diagnosis of Metastatic bone tumor ([place]) was also pertinent to this visit.  The encounter diagnosis was Non-small cell carcinoma of lung, stage 4, right ([place]).   Stage IV (T3, N2, M1 C) non-small cell lung cancer with unknown histologic subtype.  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  Interval Since Last Radiation: 4 months and 24 days    Treatment Dates: 12/13/20- 12/24/2020   Site/Dose: Chest_Rt - 30.00 of 30.00Gy, 10 of 10 fractions delivered; 3.00 Gy/Fx  Narrative/Interval History: Kristi Jennings is a 75 y.o. female who is accompanied by her husband. She returns today for re-evaluation of her metastatic right lung cancer due to a mixed response to treatment. She was last seen here for follow-up on 01/31/21. Since then, she has maintained close follow up with Dr. Julien Nordmann and his PA, and has been tolerating her systemic treatments well other than loss of appetite and fatigue.   CT of the abdomen and pelvis on 03/01/21 showed regression of the large partially cavitary right upper lobe lung mass, mediastinal lymphadenopathy, and metastatic lesions in the liver and right adrenal gland. Widespread metastatic disease to the bones was also appreciated, with multiple pathologic fractures.   As above, the patient maintained follow up with Dr. Julien Nordmann who noted the patient to tolerate systemic treatment well. However, during a follow up visit with Dr. Julien Nordmann on  04/13/21, the patient reported a 1 week history of whole body muscle soreness. The patient was accordingly referred to palliative care symptom management on 04/29/21 who prescribed her Dilaudid 6 mg for breakthrough pain, and Remeron for poor appetite. (Remeron was discontinued in December due to lack of effectiveness).   CT of the chest abdomen and pelvis on 04/29/21 demonstrated a mixed response to therapy. Specifically, the primary lesion in the right upper lobe and right adrenal metastasis appeared larger than the prior examination. However, the metastatic lesion in segment 7 of the liver showed regression compared to the prior examination. Widespread metastatic disease to the bones was seen to appear grossly similar to the prior study, and no new metastatic disease was appreciated elsewhere in the chest, abdomen or pelvis.   Accordingly, the patient followed up with Dr. Dorena Dew on 05/04/21 to discuss CT findings.  Given the patient's poor performance status and mixed response, Dr. Julien Nordmann recommended continuing with single agent Alimta as scheduled and re-referring the patient back to me for consideration of radiation to the enlarging primary lung mass and adrenal lesion.  Systemic therapy:  Palliative systemic chemotherapy with carboplatin for AUC of 5, Alimta 500 Mg/M2 and Keytruda 200 Mg IV every 3 weeks.  First dose of treatment on 12/29/20. Starting from cycle #5, the patient started maintenance Alimta and Keytruda. Keytruda discontinued from cycle #7 due to myalgias. Zometa started on 02/10/21 every 6 weeks.  More recently the patient has developed swelling in her right lower extremity.  She is also developed some erythema along the anterior portion of her right calf.  She underwent a Doppler today and  the patient reports that she was found to have clots in her calf area and a possible mass in the popliteal fossa region on ultrasound.  Patient has been placed on antibiotics for this issue.   She will meet with medical oncology tomorrow for further discussion and management of her right lower extremity swelling.  She denies any pain within the chest at this time significant cough or breathing problems.  She does have supplemental oxygen at home but does not use on a regular basis.  She denies any hemoptysis.  PAST MEDICAL HISTORY:  Past Medical History:  Diagnosis Date   Borderline diabetes mellitus    Bronchogenic cancer of right lung (HCC)    Dyslipidemia    Hypertension    Polymyalgia rheumatica ([place])     PAST SURGICAL HISTORY:History reviewed. No pertinent surgical history.  FAMILY HISTORY:  Family History  Problem Relation Age of Onset   Heart attack Mother    Heart disease Mother    Colon cancer Father    ALS Brother     SOCIAL HISTORY:  Social History   Tobacco Use   Smoking status: Former    Packs/day: 1.00    Years: 43.00    Pack years: 43.00    Types: Cigarettes    Quit date: 11/23/2008    Years since quitting: 12.4   Smokeless tobacco: Never   Tobacco comments:    quit 2010  Vaping Use   Vaping Use: Never used  Substance Use Topics   Alcohol use: Not Currently   Drug use: Never    ALLERGIES:  Allergies  Allergen Reactions   Shellfish-Derived Products Hives, Itching and Rash    Other reaction(s): Hives    Augmentin [Amoxicillin-Pot Clavulanate] Nausea And Vomiting    MEDICATIONS:  Current Outpatient Medications  Medication Sig Dispense Refill   acetaminophen (TYLENOL) 650 MG CR tablet Take 1,300 mg by mouth 3 (three) times daily as needed for pain.     atenolol (TENORMIN) 25 MG tablet Take 12.5 mg by mouth at bedtime.     diclofenac Sodium (VOLTAREN) 1 % GEL Apply 4 g topically 4 (four) times daily. (Patient taking differently: Apply 4 g topically 4 (four) times daily as needed (pain).) 150 g 0   doxycycline (VIBRA-TABS) 100 MG tablet Take 1 tablet (100 mg total) by mouth 2 (two) times daily for 7 days. 14 tablet 0   dronabinol  (MARINOL) 2.5 MG capsule Take 1 capsule (2.5 mg total) by mouth 2 (two) times daily before a meal. 60 capsule 0   ELIQUIS 5 MG TABS tablet Take 5 mg by mouth 2 (two) times daily.     escitalopram (LEXAPRO) 20 MG tablet Take 20 mg by mouth at bedtime.     folic acid (FOLVITE) 1 MG tablet TAKE ONE TABLET BY MOUTH ONE TIME DAILY 30 tablet 2   HYDROmorphone (DILAUDID) 4 MG tablet Take 1 tablet (4 mg total) by mouth every 6 (six) hours as needed for severe pain or moderate pain. 120 tablet 0   hydrOXYzine (ATARAX) 10 MG tablet Take 1 tablet (10 mg total) by mouth 3 (three) times daily as needed. 30 tablet 0   morphine (MS CONTIN) 15 MG 12 hr tablet Take 2 tablets (30 mg total) by mouth every 12 (twelve) hours. 60 tablet 0   aspirin EC 81 MG tablet Take 81 mg by mouth 4 (four) times a week. Swallow whole. (Patient not taking: Reported on 05/19/2021)     losartan (COZAAR) 50 MG  tablet Take 50 mg by mouth daily. (Patient not taking: Reported on 05/19/2021)     methylPREDNISolone (MEDROL DOSEPAK) 4 MG TBPK tablet Use as instructed (Patient not taking: Reported on 05/19/2021) 21 tablet 0   ondansetron (ZOFRAN-ODT) 4 MG disintegrating tablet Take 4 mg by mouth 2 (two) times daily as needed for nausea or vomiting. (Patient not taking: Reported on 03/16/2021)     prochlorperazine (COMPAZINE) 10 MG tablet Take 1 tablet (10 mg total) by mouth every 6 (six) hours as needed for nausea or vomiting. (Patient not taking: Reported on 03/16/2021) 30 tablet 0   rosuvastatin (CRESTOR) 10 MG tablet Take 10 mg by mouth at bedtime. (Patient not taking: Reported on 05/19/2021)     No current facility-administered medications for this encounter.    REVIEW OF SYSTEMS:  A 10+ POINT REVIEW OF SYSTEMS WAS OBTAINED including neurology, dermatology, psychiatry, cardiac, respiratory, lymph, extremities, GI, GU, musculoskeletal, constitutional, reproductive, HEENT.  Reports new diffuse pain in her spine and muscles particularly when  standing up to walk.   PHYSICAL EXAM:  height is 5\' 5"  (1.651 m) and weight is 91 lb 4 oz (41.4 kg). Her oral temperature is 98.7 F (37.1 C). Her blood pressure is 123/61 and her pulse is 66. Her respiration is 18 and oxygen saturation is 100%.   General: Alert and oriented, in no acute distress HEENT: Head is normocephalic. Extraocular movements are intact. Neck: Neck is supple, no palpable cervical or supraclavicular lymphadenopathy. Heart: Regular in rate and rhythm with no murmurs, rubs, or gallops. Chest: Clear to auscultation bilaterally, with no rhonchi, wheezes, or rales. Abdomen: Soft, nontender, nondistended, with no rigidity or guarding. Extremities: Swelling noted in the right lower extremity and some erythema along the anterior lateral portion of her calf region.  Fullness palpated in the right popliteal fossa   ECOG = 2  0 - Asymptomatic (Fully active, able to carry on all predisease activities without restriction)  1 - Symptomatic but completely ambulatory (Restricted in physically strenuous activity but ambulatory and able to carry out work of a light or sedentary nature. For example, light housework, office work)  2 - Symptomatic, <50% in bed during the day (Ambulatory and capable of all self care but unable to carry out any work activities. Up and about more than 50% of waking hours)  3 - Symptomatic, >50% in bed, but not bedbound (Capable of only limited self-care, confined to bed or chair 50% or more of waking hours)  4 - Bedbound (Completely disabled. Cannot carry on any self-care. Totally confined to bed or chair)  5 - Death   Eustace Pen MM, Creech RH, Tormey DC, et al. [phone]). "Toxicity and response criteria of the [facility]". [place] Oncol. 5 (6): 649-55  LABORATORY DATA:  Lab Results  Component Value Date   WBC 5.8 05/04/2021   HGB 9.8 (L) 05/04/2021   HCT 30.1 (L) 05/04/2021   MCV 96.2 05/04/2021   PLT 285 05/04/2021   NEUTROABS  3.7 05/04/2021   Lab Results  Component Value Date   NA 135 05/04/2021   K 3.9 05/04/2021   CL 100 05/04/2021   CO2 26 05/04/2021   GLUCOSE 120 (H) 05/04/2021   CREATININE 0.57 05/04/2021   CALCIUM 8.4 (L) 05/04/2021      RADIOGRAPHY: CT Chest W Contrast  Result Date: 05/02/2021 CLINICAL DATA:  75 year old female with history of non-small cell lung cancer status post chemotherapy and radiation therapy. Ongoing Keytruda therapy. Radiation therapy is  now complete. Follow-up study. EXAM: CT CHEST, ABDOMEN, AND PELVIS WITH CONTRAST TECHNIQUE: Multidetector CT imaging of the chest, abdomen and pelvis was performed following the standard protocol during bolus administration of intravenous contrast. CONTRAST:  53mL OMNIPAQUE IOHEXOL 350 MG/ML SOLN COMPARISON:  CT the chest, abdomen and pelvis 02/19/2021. FINDINGS: CT CHEST FINDINGS Cardiovascular: Heart size is normal. There is no significant pericardial fluid, thickening or pericardial calcification. There is aortic atherosclerosis, as well as atherosclerosis of the great vessels of the mediastinum and the coronary arteries, including calcified atherosclerotic plaque in the left main, left anterior descending, left circumflex and right coronary arteries. Ectasia of ascending thoracic aorta (4.2 cm in diameter). Mediastinum/Nodes: No pathologically enlarged mediastinal or hilar lymph nodes. Esophagus is unremarkable in appearance. No axillary lymphadenopathy. Lungs/Pleura: Treated partially cavitary mass in the right upper lobe is again noted (axial image 26 of series 4 and coronal image 51 of series 5), slightly more prominent in appearance than the prior examination, currently measuring 6.2 x 4.3 x 5.9 cm (previously 5.4 x 3.5 x 6.0 cm). Areas of septal thickening and architectural distortion surrounding the right upper lobe mass are presumably related to evolving postradiation changes. This mass abuts the major fissure, and a small portion of the lesion  may transgress the fissure into the superior segment of the right lower lobe (axial image 24 of series 4). No definite new suspicious appearing pulmonary nodules or masses are otherwise noted. No acute consolidative airspace disease. No pleural effusions. Musculoskeletal: Multiple aggressive appearing osseous lesions are again noted, compatible with widespread metastatic disease to the bones, most notable a destructive lesion in the anterolateral aspect of the right third rib. There is also a lytic lesion with chronic pathologic fracture in the upper sternum, similar to the prior examination. Chronic compression fractures of T4, T8 and T12 are again noted (T8 and T12 are likely pathologic fractures), most severe at T12 where there is 80% loss of anterior vertebral body height. CT ABDOMEN PELVIS FINDINGS Hepatobiliary: Previously noted hypovascular lesion in segment 7 of the liver is slightly decreased compared to the prior study, currently measuring 1.6 x 1.4 cm (previously 2.3 x 2.0 cm), with persistent surrounding perfusion anomaly. No new hepatic lesions are otherwise noted. No intra or extrahepatic biliary ductal dilatation. Gallbladder is normal in appearance. Pancreas: No pancreatic mass. No pancreatic ductal dilatation. No pancreatic or peripancreatic fluid collections or inflammatory changes. Spleen: Unremarkable. Adrenals/Urinary Tract: Previously noted right adrenal nodule appears more prominent than the prior study, currently measuring 2.7 x 2.0 cm (previously 2.5 x 1.7 cm). Left adrenal gland and bilateral kidneys are normal in appearance. No hydroureteronephrosis. Urinary bladder is nearly completely decompressed, but otherwise unremarkable in appearance. Stomach/Bowel: The appearance of the stomach is normal. There is no pathologic dilatation of small bowel or colon. The appendix is not confidently identified and may be surgically absent. Regardless, there are no inflammatory changes noted adjacent to  the cecum to suggest the presence of an acute appendicitis at this time. Vascular/Lymphatic: Aortic atherosclerosis, without evidence of aneurysm or dissection in the abdominal or pelvic vasculature. No lymphadenopathy noted in the abdomen or pelvis. Reproductive: Uterus and ovaries are atrophic. Other: No significant volume of ascites.  No pneumoperitoneum. Musculoskeletal: Multiple lucent regions are again noted in the visualized portions of the proximal femurs bilaterally, likely to reflect metastatic lesions. IMPRESSION: 1. Today's study demonstrates a mixed response to therapy. Specifically, the primary lesion in the right upper lobe appears larger than the prior examination, as does  the right adrenal metastasis. However, the metastatic lesion in segment 7 of the liver shows regression compared to the prior examination. Widespread metastatic disease to the bones appear grossly similar to the prior study. No new metastatic disease is noted elsewhere in the chest, abdomen or pelvis. 2. Aortic atherosclerosis, in addition to left main and 3 vessel coronary artery disease. Assessment for potential risk factor modification, dietary therapy or pharmacologic therapy may be warranted, if clinically indicated. 3. Ectasia of the ascending thoracic aorta (4.2 cm in diameter). Attention at time of routine follow-up examinations is recommended to ensure stability of this finding. 4. Additional incidental findings, as above. Electronically Signed   By: Vinnie Langton M.D.   On: 05/02/2021 10:23   CT Abdomen Pelvis W Contrast  Result Date: 05/02/2021 CLINICAL DATA:  75 year old female with history of non-small cell lung cancer status post chemotherapy and radiation therapy. Ongoing Keytruda therapy. Radiation therapy is now complete. Follow-up study. EXAM: CT CHEST, ABDOMEN, AND PELVIS WITH CONTRAST TECHNIQUE: Multidetector CT imaging of the chest, abdomen and pelvis was performed following the standard protocol during  bolus administration of intravenous contrast. CONTRAST:  23mL OMNIPAQUE IOHEXOL 350 MG/ML SOLN COMPARISON:  CT the chest, abdomen and pelvis 02/19/2021. FINDINGS: CT CHEST FINDINGS Cardiovascular: Heart size is normal. There is no significant pericardial fluid, thickening or pericardial calcification. There is aortic atherosclerosis, as well as atherosclerosis of the great vessels of the mediastinum and the coronary arteries, including calcified atherosclerotic plaque in the left main, left anterior descending, left circumflex and right coronary arteries. Ectasia of ascending thoracic aorta (4.2 cm in diameter). Mediastinum/Nodes: No pathologically enlarged mediastinal or hilar lymph nodes. Esophagus is unremarkable in appearance. No axillary lymphadenopathy. Lungs/Pleura: Treated partially cavitary mass in the right upper lobe is again noted (axial image 26 of series 4 and coronal image 51 of series 5), slightly more prominent in appearance than the prior examination, currently measuring 6.2 x 4.3 x 5.9 cm (previously 5.4 x 3.5 x 6.0 cm). Areas of septal thickening and architectural distortion surrounding the right upper lobe mass are presumably related to evolving postradiation changes. This mass abuts the major fissure, and a small portion of the lesion may transgress the fissure into the superior segment of the right lower lobe (axial image 24 of series 4). No definite new suspicious appearing pulmonary nodules or masses are otherwise noted. No acute consolidative airspace disease. No pleural effusions. Musculoskeletal: Multiple aggressive appearing osseous lesions are again noted, compatible with widespread metastatic disease to the bones, most notable a destructive lesion in the anterolateral aspect of the right third rib. There is also a lytic lesion with chronic pathologic fracture in the upper sternum, similar to the prior examination. Chronic compression fractures of T4, T8 and T12 are again noted (T8 and  T12 are likely pathologic fractures), most severe at T12 where there is 80% loss of anterior vertebral body height. CT ABDOMEN PELVIS FINDINGS Hepatobiliary: Previously noted hypovascular lesion in segment 7 of the liver is slightly decreased compared to the prior study, currently measuring 1.6 x 1.4 cm (previously 2.3 x 2.0 cm), with persistent surrounding perfusion anomaly. No new hepatic lesions are otherwise noted. No intra or extrahepatic biliary ductal dilatation. Gallbladder is normal in appearance. Pancreas: No pancreatic mass. No pancreatic ductal dilatation. No pancreatic or peripancreatic fluid collections or inflammatory changes. Spleen: Unremarkable. Adrenals/Urinary Tract: Previously noted right adrenal nodule appears more prominent than the prior study, currently measuring 2.7 x 2.0 cm (previously 2.5 x 1.7 cm). Left  adrenal gland and bilateral kidneys are normal in appearance. No hydroureteronephrosis. Urinary bladder is nearly completely decompressed, but otherwise unremarkable in appearance. Stomach/Bowel: The appearance of the stomach is normal. There is no pathologic dilatation of small bowel or colon. The appendix is not confidently identified and may be surgically absent. Regardless, there are no inflammatory changes noted adjacent to the cecum to suggest the presence of an acute appendicitis at this time. Vascular/Lymphatic: Aortic atherosclerosis, without evidence of aneurysm or dissection in the abdominal or pelvic vasculature. No lymphadenopathy noted in the abdomen or pelvis. Reproductive: Uterus and ovaries are atrophic. Other: No significant volume of ascites.  No pneumoperitoneum. Musculoskeletal: Multiple lucent regions are again noted in the visualized portions of the proximal femurs bilaterally, likely to reflect metastatic lesions. IMPRESSION: 1. Today's study demonstrates a mixed response to therapy. Specifically, the primary lesion in the right upper lobe appears larger than the  prior examination, as does the right adrenal metastasis. However, the metastatic lesion in segment 7 of the liver shows regression compared to the prior examination. Widespread metastatic disease to the bones appear grossly similar to the prior study. No new metastatic disease is noted elsewhere in the chest, abdomen or pelvis. 2. Aortic atherosclerosis, in addition to left main and 3 vessel coronary artery disease. Assessment for potential risk factor modification, dietary therapy or pharmacologic therapy may be warranted, if clinically indicated. 3. Ectasia of the ascending thoracic aorta (4.2 cm in diameter). Attention at time of routine follow-up examinations is recommended to ensure stability of this finding. 4. Additional incidental findings, as above. Electronically Signed   By: Vinnie Langton M.D.   On: 05/02/2021 10:23   VAS [place] LOWER EXTREMITY VENOUS (DVT)  Result Date: 05/19/2021  Lower Venous DVT Study Patient Name:  LAVREN LEWAN [facility]  Date of Exam:   05/19/2021 Medical Rec #: [id number]      Accession #:    [id number] Date of Birth: 01/28/1946      Patient Gender: F Patient Age:   32 years Exam Location:  [facility] Procedure:      VAS [place] LOWER EXTREMITY VENOUS (DVT) Referring Phys: Sherol Dade --------------------------------------------------------------------------------  Indications: Edema.  Limitations: Poor ultrasound/tissue interface. Comparison Study: No prior study Performing Technologist: Maudry Mayhew MHA, RDMS, RVT, RDCS  Examination Guidelines: A complete evaluation includes B-mode imaging, spectral Doppler, color Doppler, and power Doppler as needed of all accessible portions of each vessel. Bilateral testing is considered an integral part of a complete examination. Limited examinations for reoccurring indications may be performed as noted. The reflux portion of the exam is performed with the patient in reverse Trendelenburg.   +---------+---------------+---------+-----------+----------+--------------+  RIGHT     Compressibility Phasicity Spontaneity Properties Thrombus Aging  +---------+---------------+---------+-----------+----------+--------------+  CFV       Full            Yes       Yes                                    +---------+---------------+---------+-----------+----------+--------------+  SFJ       Full                                                             +---------+---------------+---------+-----------+----------+--------------+  FV Prox   Full                                                             +---------+---------------+---------+-----------+----------+--------------+  FV Mid    Full                                                             +---------+---------------+---------+-----------+----------+--------------+  FV Distal Full                                                             +---------+---------------+---------+-----------+----------+--------------+  PFV       Full                                                             +---------+---------------+---------+-----------+----------+--------------+  POP       Full            Yes       Yes                                    +---------+---------------+---------+-----------+----------+--------------+  PTV       Full                                                             +---------+---------------+---------+-----------+----------+--------------+  PERO      None                      No                     Acute           +---------+---------------+---------+-----------+----------+--------------+   +----+---------------+---------+-----------+----------+--------------+  LEFT Compressibility Phasicity Spontaneity Properties Thrombus Aging  +----+---------------+---------+-----------+----------+--------------+  CFV  Full            Yes       Yes                                     +----+---------------+---------+-----------+----------+--------------+     Summary: RIGHT: - Findings consistent with acute deep vein thrombosis involving the right peroneal veins. - A heterogenous structure is found in the popliteal fossa measuring at least 4.3 x 3.9cm with internal low-resistant vascularity. This is suggestive of neoplasm versus unknown etiology. Further imaging may be warranted if clinically indicated.  LEFT: - No evidence of common  femoral vein obstruction.  *See table(s) above for measurements and observations.    Preliminary       IMPRESSION: The encounter diagnosis was Non-small cell carcinoma of lung, stage 4, right ([place]).   Stage IV (T3, N2, M1 C) non-small cell lung cancer with unknown histologic subtype.  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  Today we reviewed the patient's recent imaging studies showing slight progression in the right upper lung mass and right adrenal gland area.  We discussed potential treatments to these areas.  The area in the right adrenal gland would technically be difficult to treat with surrounding liver kidney and bowel structures so would be hesitant to treat this area unless progresses and causes symptoms.  She would be a candidate for retreatment to the right lung mass given that she only had a palliative dose of radiation therapy earlier this year.  Patient does not want to make a decision concerning this treatment given her recent issues with right leg swelling and probable cellulitis.  She wishes to have further evaluation of this issue tomorrow.  She would like to return in several days to further discuss potential palliative radiation therapy after this new issue has been addressed.    PLAN: Patient will return for reevaluation on December 11th and for further discussion of  palliative radiation therapy as part of her overall management.   35 minutes of total time was spent for this patient encounter, including preparation, face-to-face counseling with the patient and coordination of care, physical exam, and documentation of the encounter.   ------------------------------------------------  Blair Promise, PhD, MD  This document serves as a record of services personally performed by Gery Pray, MD. It was created on his behalf by Roney Mans, a trained medical scribe. The creation of this record is based on the scribe's personal observations and the provider's statements to them. This document has been checked and approved by the attending provider.

## 2021-05-20 ENCOUNTER — Inpatient Hospital Stay (HOSPITAL_BASED_OUTPATIENT_CLINIC_OR_DEPARTMENT_OTHER): Payer: Medicare HMO | Admitting: Physician Assistant

## 2021-05-20 ENCOUNTER — Other Ambulatory Visit (HOSPITAL_COMMUNITY): Payer: Self-pay

## 2021-05-20 ENCOUNTER — Other Ambulatory Visit: Payer: Self-pay

## 2021-05-20 ENCOUNTER — Encounter: Payer: Self-pay | Admitting: Physician Assistant

## 2021-05-20 ENCOUNTER — Encounter: Payer: Self-pay | Admitting: Internal Medicine

## 2021-05-20 VITALS — BP 104/61 | HR 66 | Temp 98.2°F | Resp 17 | Wt 92.6 lb

## 2021-05-20 DIAGNOSIS — K5900 Constipation, unspecified: Secondary | ICD-10-CM | POA: Diagnosis not present

## 2021-05-20 DIAGNOSIS — C7951 Secondary malignant neoplasm of bone: Secondary | ICD-10-CM | POA: Diagnosis not present

## 2021-05-20 DIAGNOSIS — C3411 Malignant neoplasm of upper lobe, right bronchus or lung: Secondary | ICD-10-CM | POA: Diagnosis not present

## 2021-05-20 DIAGNOSIS — L03115 Cellulitis of right lower limb: Secondary | ICD-10-CM | POA: Diagnosis not present

## 2021-05-20 DIAGNOSIS — I2510 Atherosclerotic heart disease of native coronary artery without angina pectoris: Secondary | ICD-10-CM | POA: Diagnosis not present

## 2021-05-20 DIAGNOSIS — R600 Localized edema: Secondary | ICD-10-CM | POA: Diagnosis not present

## 2021-05-20 DIAGNOSIS — I82451 Acute embolism and thrombosis of right peroneal vein: Secondary | ICD-10-CM | POA: Diagnosis not present

## 2021-05-20 DIAGNOSIS — C7971 Secondary malignant neoplasm of right adrenal gland: Secondary | ICD-10-CM | POA: Diagnosis not present

## 2021-05-20 DIAGNOSIS — Z5111 Encounter for antineoplastic chemotherapy: Secondary | ICD-10-CM | POA: Diagnosis not present

## 2021-05-20 DIAGNOSIS — I700 Atherosclerosis of aorta: Secondary | ICD-10-CM | POA: Diagnosis not present

## 2021-05-20 DIAGNOSIS — R590 Localized enlarged lymph nodes: Secondary | ICD-10-CM | POA: Diagnosis not present

## 2021-05-20 DIAGNOSIS — I10 Essential (primary) hypertension: Secondary | ICD-10-CM | POA: Diagnosis not present

## 2021-05-20 MED ORDER — RIVAROXABAN (XARELTO) VTE STARTER PACK (15 & 20 MG)
ORAL_TABLET | ORAL | 0 refills | Status: DC
Start: 2021-05-20 — End: 2021-05-25
  Filled 2021-05-20: qty 51, 28d supply, fill #0

## 2021-05-20 NOTE — Progress Notes (Signed)
Symptom Management Consult note [place]    Patient Care Team: [name], MD as PCP - General (Internal Medicine) [place] Registered Nurse [facility])    Name of the patient: Kristi Jennings  [id number]  [birth year]   Date of visit: 05/20/2021    Chief complaint/ Reason for visit- follow up on [place] results, right leg swelling  Oncology History  Non-small cell carcinoma of lung, stage 4, right ([place])  11/30/2020 Initial Diagnosis   Non-small cell carcinoma of lung, stage 4, right ([place])   11/30/2020 Cancer Staging   Staging form: Lung, AJCC 8th Edition - Clinical: Stage IVB (cT3, cN2, cM1c) - Signed by Curt Bears, MD on 11/30/2020    12/29/2020 -  Chemotherapy   Patient is on Treatment Plan : LUNG CARBOplatin / Pemetrexed / Pembrolizumab q21d Induction x 4 cycles / Maintenance Pemetrexed + Pembrolizumab       Current Therapy: pembrolizumab with last treatment 05/04/21  Interval history- Tonea Leiphart. [place] is a 75 yo female with oncologic history of non-small cell lung carcinoma stage IV as well as PE on Eliquis presents to [facility] today for follow-up on ultrasound results and right leg swelling.  Patient seen in clinic x2 days ago for right leg swelling that has been going on for several months yet had worsened in the last week.  Patient had a DVT study performed yesterday.  She states the swelling has slightly improved in the interim and redness is getting better now that she has been on the doxycycline.  No over-the-counter medications for symptoms prior to arrival.  She denies any pain, fever, chills, shortness of breath, chest pain, numbness, tingling or weakness.  Denies any trauma to the right leg. Spouse is present and contributes to history.    ROS  All other systems are reviewed and are negative for acute change except as noted in the HPI.    Allergies  Allergen Reactions   Shellfish-Derived Products Hives,  Itching and Rash    Other reaction(s): Hives    Augmentin [Amoxicillin-Pot Clavulanate] Nausea And Vomiting     Past Medical History:  Diagnosis Date   Borderline diabetes mellitus    Bronchogenic cancer of right lung (HCC)    Dyslipidemia    Hypertension    Polymyalgia rheumatica ([place])      No past surgical history on file.  Social History   Socioeconomic History   Marital status: Married    Spouse name: Not on file   Number of children: Not on file   Years of education: Not on file   Highest education level: Not on file  Occupational History   Not on file  Tobacco Use   Smoking status: Former    Packs/day: 1.00    Years: 43.00    Pack years: 43.00    Types: Cigarettes    Quit date: 11/23/2008    Years since quitting: 12.4   Smokeless tobacco: Never   Tobacco comments:    quit 2010  Vaping Use   Vaping Use: Never used  Substance and Sexual Activity   Alcohol use: Not Currently   Drug use: Never   Sexual activity: Not on file  Other Topics Concern   Not on file  Social History Narrative   Not on file   Social Determinants of Health   Financial Resource Strain: Not on file  Food Insecurity: Not on file  Transportation Needs: Not on file  Physical  Activity: Not on file  Stress: Not on file  Social Connections: Not on file  Intimate Partner Violence: Not on file    Family History  Problem Relation Age of Onset   Heart attack Mother    Heart disease Mother    Colon cancer Father    ALS Brother      Current Outpatient Medications:    RIVAROXABAN (XARELTO) VTE STARTER PACK (15 & 20 MG), Follow package directions: Take one 15mg  tablet by mouth twice a day. On day 22, switch to one 20mg  tablet once a day. Take with food., Disp: 51 each, Rfl: 0   acetaminophen (TYLENOL) 650 MG CR tablet, Take 1,300 mg by mouth 3 (three) times daily as needed for pain., Disp: , Rfl:    aspirin EC 81 MG tablet, Take 81 mg by mouth 4 (four) times a week. Swallow whole.  (Patient not taking: Reported on 05/19/2021), Disp: , Rfl:    atenolol (TENORMIN) 25 MG tablet, Take 12.5 mg by mouth at bedtime., Disp: , Rfl:    diclofenac Sodium (VOLTAREN) 1 % GEL, Apply 4 g topically 4 (four) times daily. (Patient taking differently: Apply 4 g topically 4 (four) times daily as needed (pain).), Disp: 150 g, Rfl: 0   doxycycline (VIBRA-TABS) 100 MG tablet, Take 1 tablet (100 mg total) by mouth 2 (two) times daily for 7 days., Disp: 14 tablet, Rfl: 0   dronabinol (MARINOL) 2.5 MG capsule, Take 1 capsule (2.5 mg total) by mouth 2 (two) times daily before a meal., Disp: 60 capsule, Rfl: 0   escitalopram (LEXAPRO) 20 MG tablet, Take 20 mg by mouth at bedtime., Disp: , Rfl:    folic acid (FOLVITE) 1 MG tablet, TAKE ONE TABLET BY MOUTH ONE TIME DAILY, Disp: 30 tablet, Rfl: 2   HYDROmorphone (DILAUDID) 4 MG tablet, Take 1 tablet (4 mg total) by mouth every 6 (six) hours as needed for severe pain or moderate pain., Disp: 120 tablet, Rfl: 0   hydrOXYzine (ATARAX) 10 MG tablet, Take 1 tablet (10 mg total) by mouth 3 (three) times daily as needed., Disp: 30 tablet, Rfl: 0   losartan (COZAAR) 50 MG tablet, Take 50 mg by mouth daily. (Patient not taking: Reported on 05/19/2021), Disp: , Rfl:    methylPREDNISolone (MEDROL DOSEPAK) 4 MG TBPK tablet, Use as instructed (Patient not taking: Reported on 05/19/2021), Disp: 21 tablet, Rfl: 0   morphine (MS CONTIN) 15 MG 12 hr tablet, Take 2 tablets (30 mg total) by mouth every 12 (twelve) hours., Disp: 60 tablet, Rfl: 0   ondansetron (ZOFRAN-ODT) 4 MG disintegrating tablet, Take 4 mg by mouth 2 (two) times daily as needed for nausea or vomiting. (Patient not taking: Reported on 03/16/2021), Disp: , Rfl:    prochlorperazine (COMPAZINE) 10 MG tablet, Take 1 tablet (10 mg total) by mouth every 6 (six) hours as needed for nausea or vomiting. (Patient not taking: Reported on 03/16/2021), Disp: 30 tablet, Rfl: 0   rosuvastatin (CRESTOR) 10 MG tablet, Take 10  mg by mouth at bedtime. (Patient not taking: Reported on 05/19/2021), Disp: , Rfl:   PHYSICAL EXAM: ECOG FS:2 - Symptomatic, <50% confined to bed    Vitals:   05/20/21 1223  BP: 104/61  Pulse: 66  Resp: 17  Temp: 98.2 F (36.8 C)  TempSrc: Oral  SpO2: 99%  Weight: 92 lb 9.6 oz (42 kg)   Physical Exam Vitals and nursing note reviewed.  Constitutional:      Appearance: She is well-developed.  She is not ill-appearing or toxic-appearing.  HENT:     Head: Normocephalic and atraumatic.     Nose: Nose normal.  Eyes:     General: No scleral icterus.       Right eye: No discharge.        Left eye: No discharge.     Conjunctiva/sclera: Conjunctivae normal.  Neck:     Vascular: No JVD.  Cardiovascular:     Rate and Rhythm: Normal rate and regular rhythm.     Pulses: Normal pulses.     Heart sounds: Normal heart sounds.  Pulmonary:     Effort: Pulmonary effort is normal.     Breath sounds: Normal breath sounds.  Abdominal:     General: There is no distension.  Musculoskeletal:        General: Normal range of motion.     Cervical back: Normal range of motion.     Comments: Mild swelling of RLE from mid shin to dorsum of right foot. Nonpitting edema. Compartments are soft in all extremities.  Skin:    General: Skin is warm and dry.     Capillary Refill: Capillary refill takes less than 2 seconds.     Comments: Equal tactile temperature in all extremities.  Faint erythema on right shin. No open wounds  Neurological:     Mental Status: She is oriented to person, place, and time.     GCS: GCS eye subscore is 4. GCS verbal subscore is 5. GCS motor subscore is 6.     Comments: Fluent speech, no facial droop.  Psychiatric:        Behavior: Behavior normal.       LABORATORY DATA: I have reviewed the data as listed CBC Latest Ref Rng & Units 05/04/2021 04/13/2021 03/24/2021  WBC 4.0 - 10.5 K/uL 5.8 5.7 3.4(L)  Hemoglobin 12.0 - 15.0 g/dL 9.8(L) 11.0(L) 11.2(L)  Hematocrit  36.0 - 46.0 % 30.1(L) 33.3(L) 34.1(L)  Platelets 150 - 400 K/uL 285 311 186     CMP Latest Ref Rng & Units 05/04/2021 04/13/2021 03/24/2021  Glucose 70 - 99 mg/dL 120(H) 112(H) 89  BUN 8 - 23 mg/dL 8 6(L) 7(L)  Creatinine 0.44 - 1.00 mg/dL 0.57 0.62 0.60  Sodium 135 - 145 mmol/L 135 135 139  Potassium 3.5 - 5.1 mmol/L 3.9 3.9 3.6  Chloride 98 - 111 mmol/L 100 100 103  CO2 22 - 32 mmol/L 26 25 27   Calcium 8.9 - 10.3 mg/dL 8.4(L) 8.9 8.7(L)  Total Protein 6.5 - 8.1 g/dL 6.4(L) 6.5 6.6  Total Bilirubin 0.3 - 1.2 mg/dL 0.5 0.4 0.6  Alkaline Phos 38 - 126 U/L 56 65 80  AST 15 - 41 U/L 17 20 20   ALT 0 - 44 U/L 6 9 11        RADIOGRAPHIC STUDIES: I have personally reviewed the radiological images as listed and agreed with the findings in the report. No images are attached to the encounter. CT Chest W Contrast  Result Date: 05/02/2021 CLINICAL DATA:  75 year old female with history of non-small cell lung cancer status post chemotherapy and radiation therapy. Ongoing Keytruda therapy. Radiation therapy is now complete. Follow-up study. EXAM: CT CHEST, ABDOMEN, AND PELVIS WITH CONTRAST TECHNIQUE: Multidetector CT imaging of the chest, abdomen and pelvis was performed following the standard protocol during bolus administration of intravenous contrast. CONTRAST:  1mL OMNIPAQUE IOHEXOL 350 MG/ML SOLN COMPARISON:  CT the chest, abdomen and pelvis 02/19/2021. FINDINGS: CT CHEST FINDINGS Cardiovascular: Heart size is normal.  There is no significant pericardial fluid, thickening or pericardial calcification. There is aortic atherosclerosis, as well as atherosclerosis of the great vessels of the mediastinum and the coronary arteries, including calcified atherosclerotic plaque in the left main, left anterior descending, left circumflex and right coronary arteries. Ectasia of ascending thoracic aorta (4.2 cm in diameter). Mediastinum/Nodes: No pathologically enlarged mediastinal or hilar lymph nodes.  Esophagus is unremarkable in appearance. No axillary lymphadenopathy. Lungs/Pleura: Treated partially cavitary mass in the right upper lobe is again noted (axial image 26 of series 4 and coronal image 51 of series 5), slightly more prominent in appearance than the prior examination, currently measuring 6.2 x 4.3 x 5.9 cm (previously 5.4 x 3.5 x 6.0 cm). Areas of septal thickening and architectural distortion surrounding the right upper lobe mass are presumably related to evolving postradiation changes. This mass abuts the major fissure, and a small portion of the lesion may transgress the fissure into the superior segment of the right lower lobe (axial image 24 of series 4). No definite new suspicious appearing pulmonary nodules or masses are otherwise noted. No acute consolidative airspace disease. No pleural effusions. Musculoskeletal: Multiple aggressive appearing osseous lesions are again noted, compatible with widespread metastatic disease to the bones, most notable a destructive lesion in the anterolateral aspect of the right third rib. There is also a lytic lesion with chronic pathologic fracture in the upper sternum, similar to the prior examination. Chronic compression fractures of T4, T8 and T12 are again noted (T8 and T12 are likely pathologic fractures), most severe at T12 where there is 80% loss of anterior vertebral body height. CT ABDOMEN PELVIS FINDINGS Hepatobiliary: Previously noted hypovascular lesion in segment 7 of the liver is slightly decreased compared to the prior study, currently measuring 1.6 x 1.4 cm (previously 2.3 x 2.0 cm), with persistent surrounding perfusion anomaly. No new hepatic lesions are otherwise noted. No intra or extrahepatic biliary ductal dilatation. Gallbladder is normal in appearance. Pancreas: No pancreatic mass. No pancreatic ductal dilatation. No pancreatic or peripancreatic fluid collections or inflammatory changes. Spleen: Unremarkable. Adrenals/Urinary Tract:  Previously noted right adrenal nodule appears more prominent than the prior study, currently measuring 2.7 x 2.0 cm (previously 2.5 x 1.7 cm). Left adrenal gland and bilateral kidneys are normal in appearance. No hydroureteronephrosis. Urinary bladder is nearly completely decompressed, but otherwise unremarkable in appearance. Stomach/Bowel: The appearance of the stomach is normal. There is no pathologic dilatation of small bowel or colon. The appendix is not confidently identified and may be surgically absent. Regardless, there are no inflammatory changes noted adjacent to the cecum to suggest the presence of an acute appendicitis at this time. Vascular/Lymphatic: Aortic atherosclerosis, without evidence of aneurysm or dissection in the abdominal or pelvic vasculature. No lymphadenopathy noted in the abdomen or pelvis. Reproductive: Uterus and ovaries are atrophic. Other: No significant volume of ascites.  No pneumoperitoneum. Musculoskeletal: Multiple lucent regions are again noted in the visualized portions of the proximal femurs bilaterally, likely to reflect metastatic lesions. IMPRESSION: 1. Today's study demonstrates a mixed response to therapy. Specifically, the primary lesion in the right upper lobe appears larger than the prior examination, as does the right adrenal metastasis. However, the metastatic lesion in segment 7 of the liver shows regression compared to the prior examination. Widespread metastatic disease to the bones appear grossly similar to the prior study. No new metastatic disease is noted elsewhere in the chest, abdomen or pelvis. 2. Aortic atherosclerosis, in addition to left main and 3 vessel coronary artery  disease. Assessment for potential risk factor modification, dietary therapy or pharmacologic therapy may be warranted, if clinically indicated. 3. Ectasia of the ascending thoracic aorta (4.2 cm in diameter). Attention at time of routine follow-up examinations is recommended to ensure  stability of this finding. 4. Additional incidental findings, as above. Electronically Signed   By: Vinnie Langton M.D.   On: 05/02/2021 10:23   CT Abdomen Pelvis W Contrast  Result Date: 05/02/2021 CLINICAL DATA:  75 year old female with history of non-small cell lung cancer status post chemotherapy and radiation therapy. Ongoing Keytruda therapy. Radiation therapy is now complete. Follow-up study. EXAM: CT CHEST, ABDOMEN, AND PELVIS WITH CONTRAST TECHNIQUE: Multidetector CT imaging of the chest, abdomen and pelvis was performed following the standard protocol during bolus administration of intravenous contrast. CONTRAST:  31mL OMNIPAQUE IOHEXOL 350 MG/ML SOLN COMPARISON:  CT the chest, abdomen and pelvis 02/19/2021. FINDINGS: CT CHEST FINDINGS Cardiovascular: Heart size is normal. There is no significant pericardial fluid, thickening or pericardial calcification. There is aortic atherosclerosis, as well as atherosclerosis of the great vessels of the mediastinum and the coronary arteries, including calcified atherosclerotic plaque in the left main, left anterior descending, left circumflex and right coronary arteries. Ectasia of ascending thoracic aorta (4.2 cm in diameter). Mediastinum/Nodes: No pathologically enlarged mediastinal or hilar lymph nodes. Esophagus is unremarkable in appearance. No axillary lymphadenopathy. Lungs/Pleura: Treated partially cavitary mass in the right upper lobe is again noted (axial image 26 of series 4 and coronal image 51 of series 5), slightly more prominent in appearance than the prior examination, currently measuring 6.2 x 4.3 x 5.9 cm (previously 5.4 x 3.5 x 6.0 cm). Areas of septal thickening and architectural distortion surrounding the right upper lobe mass are presumably related to evolving postradiation changes. This mass abuts the major fissure, and a small portion of the lesion may transgress the fissure into the superior segment of the right lower lobe (axial image  24 of series 4). No definite new suspicious appearing pulmonary nodules or masses are otherwise noted. No acute consolidative airspace disease. No pleural effusions. Musculoskeletal: Multiple aggressive appearing osseous lesions are again noted, compatible with widespread metastatic disease to the bones, most notable a destructive lesion in the anterolateral aspect of the right third rib. There is also a lytic lesion with chronic pathologic fracture in the upper sternum, similar to the prior examination. Chronic compression fractures of T4, T8 and T12 are again noted (T8 and T12 are likely pathologic fractures), most severe at T12 where there is 80% loss of anterior vertebral body height. CT ABDOMEN PELVIS FINDINGS Hepatobiliary: Previously noted hypovascular lesion in segment 7 of the liver is slightly decreased compared to the prior study, currently measuring 1.6 x 1.4 cm (previously 2.3 x 2.0 cm), with persistent surrounding perfusion anomaly. No new hepatic lesions are otherwise noted. No intra or extrahepatic biliary ductal dilatation. Gallbladder is normal in appearance. Pancreas: No pancreatic mass. No pancreatic ductal dilatation. No pancreatic or peripancreatic fluid collections or inflammatory changes. Spleen: Unremarkable. Adrenals/Urinary Tract: Previously noted right adrenal nodule appears more prominent than the prior study, currently measuring 2.7 x 2.0 cm (previously 2.5 x 1.7 cm). Left adrenal gland and bilateral kidneys are normal in appearance. No hydroureteronephrosis. Urinary bladder is nearly completely decompressed, but otherwise unremarkable in appearance. Stomach/Bowel: The appearance of the stomach is normal. There is no pathologic dilatation of small bowel or colon. The appendix is not confidently identified and may be surgically absent. Regardless, there are no inflammatory changes noted adjacent to  the cecum to suggest the presence of an acute appendicitis at this time.  Vascular/Lymphatic: Aortic atherosclerosis, without evidence of aneurysm or dissection in the abdominal or pelvic vasculature. No lymphadenopathy noted in the abdomen or pelvis. Reproductive: Uterus and ovaries are atrophic. Other: No significant volume of ascites.  No pneumoperitoneum. Musculoskeletal: Multiple lucent regions are again noted in the visualized portions of the proximal femurs bilaterally, likely to reflect metastatic lesions. IMPRESSION: 1. Today's study demonstrates a mixed response to therapy. Specifically, the primary lesion in the right upper lobe appears larger than the prior examination, as does the right adrenal metastasis. However, the metastatic lesion in segment 7 of the liver shows regression compared to the prior examination. Widespread metastatic disease to the bones appear grossly similar to the prior study. No new metastatic disease is noted elsewhere in the chest, abdomen or pelvis. 2. Aortic atherosclerosis, in addition to left main and 3 vessel coronary artery disease. Assessment for potential risk factor modification, dietary therapy or pharmacologic therapy may be warranted, if clinically indicated. 3. Ectasia of the ascending thoracic aorta (4.2 cm in diameter). Attention at time of routine follow-up examinations is recommended to ensure stability of this finding. 4. Additional incidental findings, as above. Electronically Signed   By: Vinnie Langton M.D.   On: 05/02/2021 10:23   VAS [place] LOWER EXTREMITY VENOUS (DVT)  Result Date: 05/19/2021  Lower Venous DVT Study Patient Name:  ROMI RATHEL [facility]  Date of Exam:   05/19/2021 Medical Rec #: [id number]      Accession #:    [id number] Date of Birth: 1945-08-12      Patient Gender: F Patient Age:   34 years Exam Location:  [facility] Procedure:      VAS [place] LOWER EXTREMITY VENOUS (DVT) Referring Phys: Sherol Dade --------------------------------------------------------------------------------  Indications: Edema.   Limitations: Poor ultrasound/tissue interface. Comparison Study: No prior study Performing Technologist: Maudry Mayhew MHA, RDMS, RVT, RDCS  Examination Guidelines: A complete evaluation includes B-mode imaging, spectral Doppler, color Doppler, and power Doppler as needed of all accessible portions of each vessel. Bilateral testing is considered an integral part of a complete examination. Limited examinations for reoccurring indications may be performed as noted. The reflux portion of the exam is performed with the patient in reverse Trendelenburg.  +---------+---------------+---------+-----------+----------+--------------+  RIGHT     Compressibility Phasicity Spontaneity Properties Thrombus Aging  +---------+---------------+---------+-----------+----------+--------------+  CFV       Full            Yes       Yes                                    +---------+---------------+---------+-----------+----------+--------------+  SFJ       Full                                                             +---------+---------------+---------+-----------+----------+--------------+  FV Prox   Full                                                             +---------+---------------+---------+-----------+----------+--------------+  FV Mid    Full                                                             +---------+---------------+---------+-----------+----------+--------------+  FV Distal Full                                                             +---------+---------------+---------+-----------+----------+--------------+  PFV       Full                                                             +---------+---------------+---------+-----------+----------+--------------+  POP       Full            Yes       Yes                                    +---------+---------------+---------+-----------+----------+--------------+  PTV       Full                                                              +---------+---------------+---------+-----------+----------+--------------+  PERO      None                      No                     Acute           +---------+---------------+---------+-----------+----------+--------------+   +----+---------------+---------+-----------+----------+--------------+  LEFT Compressibility Phasicity Spontaneity Properties Thrombus Aging  +----+---------------+---------+-----------+----------+--------------+  CFV  Full            Yes       Yes                                    +----+---------------+---------+-----------+----------+--------------+     Summary: RIGHT: - Findings consistent with acute deep vein thrombosis involving the right peroneal veins. - A heterogenous structure is found in the popliteal fossa measuring at least 4.3 x 3.9cm with internal low-resistant vascularity. This is suggestive of neoplasm versus unknown etiology. Further imaging may be warranted if clinically indicated.  LEFT: - No evidence of common femoral vein obstruction.  *See table(s) above for measurements and observations. Electronically signed by Harold Barban MD on 05/19/2021 at 8:01:58 PM.    Final      ASSESSMENT & PLAN: Patient is a 75 y.o. female with history of non-small cell lung cancer followed by oncologist Dr. Julien Nordmann.  #)Right leg swelling- [place] study positive for acute DVT in right peroneal veins. Swelling  on exam today is improved compared to last visit. Discussed results with patient and her spouse. Despite being complaint with Eliquis she developed a DVT therefore will switch to Xarelto. Prescription for starter sent to pharmacy. Patient without contraindications to taking Xarelto. I viewed  most recent labs from 2 weeks prior and hemoglobin stable, normal platelets, normal kidney function. Discussed bleeding risks and precautions while on a blood thinner with patient.  [place] also shows A heterogenous structure is found in the popliteal fossa measuring at  least 4.3 x 3.9cm with  internal low-resistant vascularity. This is  suggestive of neoplasm versus unknown etiology. Discussed this finding as well. Patient has upcoming appointment scheduled with oncologist 05/24/21 so will defer further imaging decision to oncologist as he is out of the office currently.   #)Right leg erythema- Start on doxycycline at last clinic visit. Erythema appears improved today. Low suspicion for cellulitis however with improvement encouraged her to finish antibiotic therapy.  Patient and spouse agreeable with plan of care. ED precautions discussed.   Visit Diagnosis: 1. Acute deep vein thrombosis (DVT) of right peroneal vein (HCC)   2. Cellulitis of right lower extremity      No orders of the defined types were placed in this encounter.   All questions were answered. The patient knows to call the clinic with any problems, questions or concerns. No barriers to learning was detected.  I have spent a total of 30 minutes minutes of face-to-face and non-face-to-face time, preparing to see the patient, obtaining and/or reviewing separately obtained history, performing a medically appropriate examination, counseling and educating the patient, ordering tests,  documenting clinical information in the electronic health record, and care coordination.     Thank you for allowing me to participate in the care of this patient.    Barrie Folk, PA-C Department of Hematology/Oncology [facility] at [facility] Phone: [phone]  [phone]    05/20/2021 1:21 PM

## 2021-05-21 DIAGNOSIS — C3490 Malignant neoplasm of unspecified part of unspecified bronchus or lung: Secondary | ICD-10-CM | POA: Diagnosis not present

## 2021-05-22 ENCOUNTER — Encounter: Payer: Self-pay | Admitting: Physician Assistant

## 2021-05-22 ENCOUNTER — Encounter: Payer: Self-pay | Admitting: Internal Medicine

## 2021-05-24 NOTE — Progress Notes (Signed)
[facility] OFFICE PROGRESS NOTE  Kristi Glee., MD [address] [place] [address]  DIAGNOSIS: Stage IV (T3, N2, M1 C) non-small cell lung cancer with unknown histologic subtype.  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   DETECTED ALTERATION(S) / BIOMARKER(S)      % CFDNA OR AMPLIFICATION        ASSOCIATED FDA-APPROVED THERAPIES         CLINICAL TRIAL AVAILABILITY [id number] 8.9%   Sotorasib Yes [id number] 7.2% None     Yes   PRIOR THERAPY:  Palliative radiotherapy to the painful bone lesions under the care of Dr. Sondra Come.  CURRENT THERAPY:  1) Palliative systemic chemotherapy with carboplatin for AUC of 5, Alimta 500 Mg/M2 and Keytruda 200 Mg IV every 3 weeks.  First dose of treatment on December 29, 2020. Status post 6 cycles.  Starting from cycle #5, the patient is going to start maintenance Alimta and Keytruda. Keytruda discontinued from cycle #7 due to myalgias.  2)Zometa every 6 weeks starting on 02/10/21 3) Possible palliative radiation to the enlarging pulmonary mass under the care of Dr. Sondra Come.  INTERVAL HISTORY: Kristi Jennings 76 y.o. female returns to the clinic today for a follow-up visit accompanied by her husband.  At the patient's appointment on 04/13/2021, the patient began experiencing abrupt onset of diffuse muscle/body soreness.  She reported tenderness to palpation over her muscles.  She is currently followed by palliative care for pain management for which she takes MS Contin 30 mg twice daily and Dilaudid 4 mg for breakthrough pain. She is requesting a refill today. [name] was discontinued starting from cycle #7 which was on 05/04/2021 and she is given a Medrol Dosepak. She did not notice an appreciable difference in her myalgias with the medrol dosepak. Her pain comes and  goes. They are going to be re-evaluated by her rheumatologist in a few days.   Since she was last seen in the clinic, the patient developed erythema and pain in the right lower extremity.  The patient was previously on Eliquis for PE which she was compliant with taking.  The patient had a ultrasound of the lower extremity last week which showed acute DVT.  Dr. Julien Nordmann recommends switching her to Lovenox.  The ultrasound also showed an abnormal finding in the popliteal fossa of unknown etiology which could be malignant vs other unknown etiology.   Today she denies any fever, chills, or night sweats.  She is followed closely by member the nutritionist team for poor appetite.  She is currently prescribed Marinol by palliative care.  They are also trying to use protein shakes. She is going to be evaluated by a member of the nutritionist team while in the infusion room today. The patient is not a good candidate for Megace due to her recent small pulmonary embolism and DVT.   She met with Dr. Sondra Come to discuss palliative radiation to the enlarging adrenal and primary lung lesion. Dr. Clabe Seal note indicates that he right adrenal gland would technically be difficult to treat with surrounding liver kidney and bowel structures; therefore, She would be a candidate for retreatment to the right lung mass given that she only had a palliative dose of radiation therapy earlier this year. However, the patient did not want to make a decision at that time about  radiation given the probable cellulitis and DVT. Therefore, they are going to return to see Dr. Sondra Come in a few days to re-visit this.  She denies any chest pain, cough, or hemoptysis.  She continues to have dyspnea on exertion.  She denies any nausea, vomiting, constipation, or diarrhea. The right lower extremity erythema has improved with the doxycycline. She denies any headache or visual changes.  The patient is here today for evaluation and repeat blood work before  considering cycle #8.     MEDICAL HISTORY: Past Medical History:  Diagnosis Date   Borderline diabetes mellitus    Bronchogenic cancer of right lung (HCC)    Dyslipidemia    Hypertension    Polymyalgia rheumatica (HCC)     ALLERGIES:  is allergic to shellfish-derived products and augmentin [amoxicillin-pot clavulanate].  MEDICATIONS:  Current Outpatient Medications  Medication Sig Dispense Refill   enoxaparin (LOVENOX) 60 MG/0.6ML injection Inject 0.6 mLs (60 mg total) into the skin daily. 18 mL 2   acetaminophen (TYLENOL) 650 MG CR tablet Take 1,300 mg by mouth 3 (three) times daily as needed for pain.     aspirin EC 81 MG tablet Take 81 mg by mouth 4 (four) times a week. Swallow whole. (Patient not taking: Reported on 05/19/2021)     atenolol (TENORMIN) 25 MG tablet Take 12.5 mg by mouth at bedtime.     diclofenac Sodium (VOLTAREN) 1 % GEL Apply 4 g topically 4 (four) times daily. (Patient taking differently: Apply 4 g topically 4 (four) times daily as needed (pain).) 150 g 0   doxycycline (VIBRA-TABS) 100 MG tablet Take 1 tablet (100 mg total) by mouth 2 (two) times daily for 7 days. 14 tablet 0   dronabinol (MARINOL) 2.5 MG capsule Take 1 capsule (2.5 mg total) by mouth 2 (two) times daily before a meal. 60 capsule 0   escitalopram (LEXAPRO) 20 MG tablet Take 20 mg by mouth at bedtime.     folic acid (FOLVITE) 1 MG tablet TAKE ONE TABLET BY MOUTH ONE TIME DAILY 30 tablet 2   HYDROmorphone (DILAUDID) 4 MG tablet Take 1 tablet (4 mg total) by mouth every 6 (six) hours as needed for severe pain or moderate pain. 120 tablet 0   hydrOXYzine (ATARAX) 10 MG tablet Take 1 tablet (10 mg total) by mouth 3 (three) times daily as needed. 30 tablet 0   losartan (COZAAR) 50 MG tablet Take 50 mg by mouth daily. (Patient not taking: Reported on 05/19/2021)     methylPREDNISolone (MEDROL DOSEPAK) 4 MG TBPK tablet Use as instructed (Patient not taking: Reported on 05/19/2021) 21 tablet 0   morphine  (MS CONTIN) 30 MG 12 hr tablet Take 1 tablet (30 mg total) by mouth every 12 (twelve) hours. 60 tablet 0   ondansetron (ZOFRAN-ODT) 4 MG disintegrating tablet Take 4 mg by mouth 2 (two) times daily as needed for nausea or vomiting. (Patient not taking: Reported on 03/16/2021)     prochlorperazine (COMPAZINE) 10 MG tablet Take 1 tablet (10 mg total) by mouth every 6 (six) hours as needed for nausea or vomiting. (Patient not taking: Reported on 03/16/2021) 30 tablet 0   rosuvastatin (CRESTOR) 10 MG tablet Take 10 mg by mouth at bedtime. (Patient not taking: Reported on 05/19/2021)     No current facility-administered medications for this visit.    SURGICAL HISTORY: No past surgical history on file.  REVIEW OF SYSTEMS:   Review of Systems  Constitutional: Positive for up and down appetite  and fatigue following treatment. Negative for chills, fever and unexpected weight change.  HENT: Negative for mouth sores, nosebleeds, sore throat and trouble swallowing.   Eyes: Negative for eye problems and icterus.  Respiratory: Positive for occasional dyspnea on exertion. Negative for cough, hemoptysis and wheezing.   Cardiovascular: Positive for bilateral lower extremity pitting edema. Negative for chest pain and leg swelling.  Gastrointestinal:  Negative for abdominal pain,constipation, diarrhea, nausea and vomiting.  Genitourinary: Negative for bladder incontinence, difficulty urinating, dysuria, frequency and hematuria.   Musculoskeletal: Positive for diffuse and generalized muscle aches.  Negative for gait problem, neck pain and neck stiffness.  Skin: Negative for itching and rash.  Neurological: Negative for dizziness, extremity weakness, gait problem, headaches, light-headedness and seizures.  Hematological: Negative for adenopathy. Does not bruise/bleed easily.  Psychiatric/Behavioral: Negative for confusion, depression and sleep disturbance. The patient is not nervous/anxious   PHYSICAL  EXAMINATION:  Blood pressure (!) 130/55, pulse 69, temperature 97.9 F (36.6 C), temperature source Temporal, resp. rate 17, height $RemoveBe'5\' 5"'[id number]$  (1.651 m), weight 92 lb 9.6 oz (42 kg), SpO2 95 %.  ECOG PERFORMANCE STATUS: 2-3  Physical Exam  Constitutional: Oriented to person, place, and time and cachetic appearing female, and in no distress.  HENT:  Head: Normocephalic and atraumatic.  Mouth/Throat: Oropharynx is clear and moist. No oropharyngeal exudate.  Eyes: Conjunctivae are normal. Right eye exhibits no discharge. Left eye exhibits no discharge. No scleral icterus.  Neck: Normal range of motion. Neck supple.  Cardiovascular: Normal rate, regular rhythm, normal heart sounds and intact distal pulses.   Pulmonary/Chest: Effort normal and breath sounds normal. No respiratory distress. No wheezes. No rales.  Abdominal: Soft. Bowel sounds are normal. Exhibits no distension and no mass. There is no tenderness.  Musculoskeletal: Normal range of motion. Positive for mild bilateral lower extremity pitting edema.  Lymphadenopathy:    No cervical adenopathy.  Neurological: Alert and oriented to person, place, and time. Exhibits muscle wasting. Examined in the wheelchair.  Skin: Skin is warm and dry. No rash noted. Not diaphoretic. No erythema. No pallor.  Psychiatric: Mood, memory and judgment normal.  Vitals reviewed.  LABORATORY DATA: Lab Results  Component Value Date   WBC 4.8 05/25/2021   HGB 10.1 (L) 05/25/2021   HCT 30.4 (L) 05/25/2021   MCV 93.5 05/25/2021   PLT 300 05/25/2021      Chemistry      Component Value Date/Time   NA 133 (L) 05/25/2021 1349   K 3.9 05/25/2021 1349   CL 99 05/25/2021 1349   CO2 27 05/25/2021 1349   BUN 11 05/25/2021 1349   CREATININE 0.49 05/25/2021 1349      Component Value Date/Time   CALCIUM 8.7 (L) 05/25/2021 1349   ALKPHOS 52 05/25/2021 1349   AST 19 05/25/2021 1349   ALT 8 05/25/2021 1349   BILITOT 0.4 05/25/2021 1349       RADIOGRAPHIC  STUDIES:  CT Chest W Contrast  Result Date: 05/02/2021 CLINICAL DATA:  76 year old female with history of non-small cell lung cancer status post chemotherapy and radiation therapy. Ongoing Keytruda therapy. Radiation therapy is now complete. Follow-up study. EXAM: CT CHEST, ABDOMEN, AND PELVIS WITH CONTRAST TECHNIQUE: Multidetector CT imaging of the chest, abdomen and pelvis was performed following the standard protocol during bolus administration of intravenous contrast. CONTRAST:  36mL OMNIPAQUE IOHEXOL 350 MG/ML SOLN COMPARISON:  CT the chest, abdomen and pelvis 02/19/2021. FINDINGS: CT CHEST FINDINGS Cardiovascular: Heart size is normal. There is no significant pericardial fluid,  thickening or pericardial calcification. There is aortic atherosclerosis, as well as atherosclerosis of the great vessels of the mediastinum and the coronary arteries, including calcified atherosclerotic plaque in the left main, left anterior descending, left circumflex and right coronary arteries. Ectasia of ascending thoracic aorta (4.2 cm in diameter). Mediastinum/Nodes: No pathologically enlarged mediastinal or hilar lymph nodes. Esophagus is unremarkable in appearance. No axillary lymphadenopathy. Lungs/Pleura: Treated partially cavitary mass in the right upper lobe is again noted (axial image 26 of series 4 and coronal image 51 of series 5), slightly more prominent in appearance than the prior examination, currently measuring 6.2 x 4.3 x 5.9 cm (previously 5.4 x 3.5 x 6.0 cm). Areas of septal thickening and architectural distortion surrounding the right upper lobe mass are presumably related to evolving postradiation changes. This mass abuts the major fissure, and a small portion of the lesion may transgress the fissure into the superior segment of the right lower lobe (axial image 24 of series 4). No definite new suspicious appearing pulmonary nodules or masses are otherwise noted. No acute consolidative airspace disease. No  pleural effusions. Musculoskeletal: Multiple aggressive appearing osseous lesions are again noted, compatible with widespread metastatic disease to the bones, most notable a destructive lesion in the anterolateral aspect of the right third rib. There is also a lytic lesion with chronic pathologic fracture in the upper sternum, similar to the prior examination. Chronic compression fractures of T4, T8 and T12 are again noted (T8 and T12 are likely pathologic fractures), most severe at T12 where there is 80% loss of anterior vertebral body height. CT ABDOMEN PELVIS FINDINGS Hepatobiliary: Previously noted hypovascular lesion in segment 7 of the liver is slightly decreased compared to the prior study, currently measuring 1.6 x 1.4 cm (previously 2.3 x 2.0 cm), with persistent surrounding perfusion anomaly. No new hepatic lesions are otherwise noted. No intra or extrahepatic biliary ductal dilatation. Gallbladder is normal in appearance. Pancreas: No pancreatic mass. No pancreatic ductal dilatation. No pancreatic or peripancreatic fluid collections or inflammatory changes. Spleen: Unremarkable. Adrenals/Urinary Tract: Previously noted right adrenal nodule appears more prominent than the prior study, currently measuring 2.7 x 2.0 cm (previously 2.5 x 1.7 cm). Left adrenal gland and bilateral kidneys are normal in appearance. No hydroureteronephrosis. Urinary bladder is nearly completely decompressed, but otherwise unremarkable in appearance. Stomach/Bowel: The appearance of the stomach is normal. There is no pathologic dilatation of small bowel or colon. The appendix is not confidently identified and may be surgically absent. Regardless, there are no inflammatory changes noted adjacent to the cecum to suggest the presence of an acute appendicitis at this time. Vascular/Lymphatic: Aortic atherosclerosis, without evidence of aneurysm or dissection in the abdominal or pelvic vasculature. No lymphadenopathy noted in the  abdomen or pelvis. Reproductive: Uterus and ovaries are atrophic. Other: No significant volume of ascites.  No pneumoperitoneum. Musculoskeletal: Multiple lucent regions are again noted in the visualized portions of the proximal femurs bilaterally, likely to reflect metastatic lesions. IMPRESSION: 1. Today's study demonstrates a mixed response to therapy. Specifically, the primary lesion in the right upper lobe appears larger than the prior examination, as does the right adrenal metastasis. However, the metastatic lesion in segment 7 of the liver shows regression compared to the prior examination. Widespread metastatic disease to the bones appear grossly similar to the prior study. No new metastatic disease is noted elsewhere in the chest, abdomen or pelvis. 2. Aortic atherosclerosis, in addition to left main and 3 vessel coronary artery disease. Assessment for potential risk factor  modification, dietary therapy or pharmacologic therapy may be warranted, if clinically indicated. 3. Ectasia of the ascending thoracic aorta (4.2 cm in diameter). Attention at time of routine follow-up examinations is recommended to ensure stability of this finding. 4. Additional incidental findings, as above. Electronically Signed   By: Vinnie Langton M.D.   On: 05/02/2021 10:23   CT Abdomen Pelvis W Contrast  Result Date: 05/02/2021 CLINICAL DATA:  76 year old female with history of non-small cell lung cancer status post chemotherapy and radiation therapy. Ongoing Keytruda therapy. Radiation therapy is now complete. Follow-up study. EXAM: CT CHEST, ABDOMEN, AND PELVIS WITH CONTRAST TECHNIQUE: Multidetector CT imaging of the chest, abdomen and pelvis was performed following the standard protocol during bolus administration of intravenous contrast. CONTRAST:  65mL OMNIPAQUE IOHEXOL 350 MG/ML SOLN COMPARISON:  CT the chest, abdomen and pelvis 02/19/2021. FINDINGS: CT CHEST FINDINGS Cardiovascular: Heart size is normal. There is no  significant pericardial fluid, thickening or pericardial calcification. There is aortic atherosclerosis, as well as atherosclerosis of the great vessels of the mediastinum and the coronary arteries, including calcified atherosclerotic plaque in the left main, left anterior descending, left circumflex and right coronary arteries. Ectasia of ascending thoracic aorta (4.2 cm in diameter). Mediastinum/Nodes: No pathologically enlarged mediastinal or hilar lymph nodes. Esophagus is unremarkable in appearance. No axillary lymphadenopathy. Lungs/Pleura: Treated partially cavitary mass in the right upper lobe is again noted (axial image 26 of series 4 and coronal image 51 of series 5), slightly more prominent in appearance than the prior examination, currently measuring 6.2 x 4.3 x 5.9 cm (previously 5.4 x 3.5 x 6.0 cm). Areas of septal thickening and architectural distortion surrounding the right upper lobe mass are presumably related to evolving postradiation changes. This mass abuts the major fissure, and a small portion of the lesion may transgress the fissure into the superior segment of the right lower lobe (axial image 24 of series 4). No definite new suspicious appearing pulmonary nodules or masses are otherwise noted. No acute consolidative airspace disease. No pleural effusions. Musculoskeletal: Multiple aggressive appearing osseous lesions are again noted, compatible with widespread metastatic disease to the bones, most notable a destructive lesion in the anterolateral aspect of the right third rib. There is also a lytic lesion with chronic pathologic fracture in the upper sternum, similar to the prior examination. Chronic compression fractures of T4, T8 and T12 are again noted (T8 and T12 are likely pathologic fractures), most severe at T12 where there is 80% loss of anterior vertebral body height. CT ABDOMEN PELVIS FINDINGS Hepatobiliary: Previously noted hypovascular lesion in segment 7 of the liver is  slightly decreased compared to the prior study, currently measuring 1.6 x 1.4 cm (previously 2.3 x 2.0 cm), with persistent surrounding perfusion anomaly. No new hepatic lesions are otherwise noted. No intra or extrahepatic biliary ductal dilatation. Gallbladder is normal in appearance. Pancreas: No pancreatic mass. No pancreatic ductal dilatation. No pancreatic or peripancreatic fluid collections or inflammatory changes. Spleen: Unremarkable. Adrenals/Urinary Tract: Previously noted right adrenal nodule appears more prominent than the prior study, currently measuring 2.7 x 2.0 cm (previously 2.5 x 1.7 cm). Left adrenal gland and bilateral kidneys are normal in appearance. No hydroureteronephrosis. Urinary bladder is nearly completely decompressed, but otherwise unremarkable in appearance. Stomach/Bowel: The appearance of the stomach is normal. There is no pathologic dilatation of small bowel or colon. The appendix is not confidently identified and may be surgically absent. Regardless, there are no inflammatory changes noted adjacent to the cecum to suggest the presence  of an acute appendicitis at this time. Vascular/Lymphatic: Aortic atherosclerosis, without evidence of aneurysm or dissection in the abdominal or pelvic vasculature. No lymphadenopathy noted in the abdomen or pelvis. Reproductive: Uterus and ovaries are atrophic. Other: No significant volume of ascites.  No pneumoperitoneum. Musculoskeletal: Multiple lucent regions are again noted in the visualized portions of the proximal femurs bilaterally, likely to reflect metastatic lesions. IMPRESSION: 1. Today's study demonstrates a mixed response to therapy. Specifically, the primary lesion in the right upper lobe appears larger than the prior examination, as does the right adrenal metastasis. However, the metastatic lesion in segment 7 of the liver shows regression compared to the prior examination. Widespread metastatic disease to the bones appear grossly  similar to the prior study. No new metastatic disease is noted elsewhere in the chest, abdomen or pelvis. 2. Aortic atherosclerosis, in addition to left main and 3 vessel coronary artery disease. Assessment for potential risk factor modification, dietary therapy or pharmacologic therapy may be warranted, if clinically indicated. 3. Ectasia of the ascending thoracic aorta (4.2 cm in diameter). Attention at time of routine follow-up examinations is recommended to ensure stability of this finding. 4. Additional incidental findings, as above. Electronically Signed   By: Vinnie Langton M.D.   On: 05/02/2021 10:23   VAS [place] LOWER EXTREMITY VENOUS (DVT)  Result Date: 05/19/2021  Lower Venous DVT Study Patient Name:  CHAYANNE FILIPPI [facility]  Date of Exam:   05/19/2021 Medical Rec #: [id number]      Accession #:    [id number] Date of Birth: [birth year]      Patient Gender: F Patient Age:   15 years Exam Location:  [facility] Procedure:      VAS [place] LOWER EXTREMITY VENOUS (DVT) Referring Phys: Sherol Dade --------------------------------------------------------------------------------  Indications: Edema.  Limitations: Poor ultrasound/tissue interface. Comparison Study: No prior study Performing Technologist: Maudry Mayhew MHA, RDMS, RVT, RDCS  Examination Guidelines: A complete evaluation includes B-mode imaging, spectral Doppler, color Doppler, and power Doppler as needed of all accessible portions of each vessel. Bilateral testing is considered an integral part of a complete examination. Limited examinations for reoccurring indications may be performed as noted. The reflux portion of the exam is performed with the patient in reverse Trendelenburg.  +---------+---------------+---------+-----------+----------+--------------+  RIGHT     Compressibility Phasicity Spontaneity Properties Thrombus Aging  +---------+---------------+---------+-----------+----------+--------------+  CFV       Full            Yes        Yes                                    +---------+---------------+---------+-----------+----------+--------------+  SFJ       Full                                                             +---------+---------------+---------+-----------+----------+--------------+  FV Prox   Full                                                             +---------+---------------+---------+-----------+----------+--------------+  FV Mid    Full                                                             +---------+---------------+---------+-----------+----------+--------------+  FV Distal Full                                                             +---------+---------------+---------+-----------+----------+--------------+  PFV       Full                                                             +---------+---------------+---------+-----------+----------+--------------+  POP       Full            Yes       Yes                                    +---------+---------------+---------+-----------+----------+--------------+  PTV       Full                                                             +---------+---------------+---------+-----------+----------+--------------+  PERO      None                      No                     Acute           +---------+---------------+---------+-----------+----------+--------------+   +----+---------------+---------+-----------+----------+--------------+  LEFT Compressibility Phasicity Spontaneity Properties Thrombus Aging  +----+---------------+---------+-----------+----------+--------------+  CFV  Full            Yes       Yes                                    +----+---------------+---------+-----------+----------+--------------+     Summary: RIGHT: - Findings consistent with acute deep vein thrombosis involving the right peroneal veins. - A heterogenous structure is found in the popliteal fossa measuring at least 4.3 x 3.9cm with internal low-resistant vascularity. This is suggestive  of neoplasm versus unknown etiology. Further imaging may be warranted if clinically indicated.  LEFT: - No evidence of common femoral vein obstruction.  *See table(s) above for measurements and observations. Electronically signed by Harold Barban MD on 05/19/2021 at 8:01:58 PM.    Final      ASSESSMENT/PLAN:  This is a very pleasant 76 year old Caucasian female diagnosed with stage IV (T3, N2, M1 C) non-small cell lung cancer with unknown histologic subtype questionably likely to be squamous cell carcinoma based on the  morphology of the right upper lobe cavitary mass with mediastinal lymphadenopathy metastatic disease to the bones as well as the right adrenal gland.  She was diagnosed in June 2022.  Adenocarcinoma cannot be completely ruled out especially with the K-ras G12C mutation.  Since the patient has an actionable mutation with K-ras G12C, this can be used as an option in the second line setting.   The patient completed palliative radiotherapy to multiple metastatic bone lesions under the care of Dr. Sondra Come.   The patient is currently undergoing systemic chemotherapy with carboplatin AUC 5, Alimta 500 mg per metered squared, Keytruda 200 mg IV every 3 weeks.  She is status post 7 cycles.  Starting from cycle #5, the patient will be on maintenance Alimta 500 mg per metered squared and Keytruda 200 mg. We will discontinue Keytruda starting from cycle #7 due to her significant myalgias in the event this is immunotherapy related. She is going to see her rheumatologist later this week for re-evaluation.   She is currently undergoing radiation under the care of Dr. Sondra Come to the enlarging adrenal lesion and primary lung mass.  They are going to reconnect with Dr. Sondra Come in a few days after her office visit with [place]. Dr. Clabe Seal note indicates that he right adrenal gland would technically be difficult to treat with surrounding liver kidney and bowel structures; therefore, She would be a candidate for  retreatment to the right lung mass given that she only had a palliative dose of radiation therapy earlier this year.   The patient was seen with Dr. Julien Nordmann today.  Dr. Julien Nordmann reviewed the patient's recent ultrasound which showed a heterogenous structure is found in the popliteal fossa measuring at  least 4.3 x 3.9cm in the popliteal fossa. This is suggestive of neoplasm versus unknown etiology. Dr. Julien Nordmann recommends a CT of the knee to further evaluate this.  I have placed the order.  The patient is also completing antibiotics for possible cellulitis.   Regarding the DVT despite being on Eliquis, Dr. Julien Nordmann recommends switching the patient to Lovenox. I will send a prescription to the pharmacy. Dr. Julien Nordmann recommends taking this for at least 1 year. He recommends about 60 mg daily. The patient was advised to discontinue her Xarelto.  We will see if the patient can receive education on how to administer the Lovenox injections while in the infusion room today. She will inject 0.6 ml in her abdomen daily.   She will proceed with cycle #8 today scheduled with Alimta only.  We will continue to hold the [facility].  We will reach out to palliative care regarding her refill of MS contin 30 mg BID p.o. daily.   We will see her back for follow-up visit in 3 weeks for evaluation before starting cycle #9.  She will continue taking Zometa every 6 weeks for metastatic bone lesions.  The patient and her husband were inquiring about the COVID-19 second booster. Recommend taking this on her off week of treatment.   The patient was advised to call immediately if she has any concerning symptoms in the interval. The patient voices understanding of current disease status and treatment options and is in agreement with the current care plan. All questions were answered. The patient knows to call the clinic with any problems, questions or concerns. We can certainly see the patient much sooner if  necessary         Orders Placed This Encounter  Procedures   CT KNEE RIGHT W WO CONTRAST  Standing Status:   Future    Standing Expiration Date:   05/25/2022    Order Specific Question:   If indicated for the ordered procedure, I authorize the administration of contrast media per Radiology protocol    Answer:   Yes    Order Specific Question:   Preferred imaging location?    Answer:   [place], PA-C 05/25/21  ADDENDUM: Hematology/Oncology Attending: I had a face-to-face encounter with the patient today.  I reviewed her records, lab and recent imaging studies and recommended her care plan.  This is a very pleasant 76 years old white female with history of stage IV non-small cell lung cancer of unknown histologic subtype but likely adenocarcinoma based on the molecular studies and the K-ras G 12 C mutation.  The patient is started systemic chemotherapy with carboplatin, Alimta and Keytruda for 4 cycles.  She is currently on maintenance treatment initially with Alimta and Keytruda but starting from cycle #7 her treatment was [name] was discontinued secondary to significant myalgia and arthralgia.  She is currently on single agent Alimta and tolerating her treatment well. She has been on treatment with Eliquis for pulmonary embolism but the patient had recent diagnosis of right lower extremity deep venous thrombosis with suspicious mass lesion in the popliteal area. I recommended for her to switch her treatment from Eliquis to subcutaneous Lovenox 60 Mg subcutaneously on daily basis. The patient will continue her current treatment with maintenance Alimta as planned today.  She was also referred to Dr. Sondra Come for consideration of SBRT to the progressive pulmonary nodule as well as the adrenal metastasis. For the suspicious mass and the right popliteal area, will order CT scan of the knee to rule out any metastatic disease to this area which is  unlikely site of metastasis for small cell lung cancer but cannot be completely ruled out at this point. The patient will come back for follow-up visit in 3 weeks for evaluation before the next cycle of her treatment. The patient was advised to call immediately if she has any other concerning symptoms in the interval.  The total time spent in the appointment was 30 minutes. Disclaimer: This note was dictated with voice recognition software. Similar sounding words can inadvertently be transcribed and may be missed upon review. Eilleen Kempf, MD 05/25/21

## 2021-05-25 ENCOUNTER — Other Ambulatory Visit: Payer: Self-pay

## 2021-05-25 ENCOUNTER — Other Ambulatory Visit: Payer: Medicare HMO

## 2021-05-25 ENCOUNTER — Inpatient Hospital Stay: Payer: Medicare HMO | Admitting: Dietician

## 2021-05-25 ENCOUNTER — Other Ambulatory Visit: Payer: Self-pay | Admitting: Nurse Practitioner

## 2021-05-25 ENCOUNTER — Inpatient Hospital Stay: Payer: Medicare HMO

## 2021-05-25 ENCOUNTER — Inpatient Hospital Stay: Payer: Medicare HMO | Admitting: Physician Assistant

## 2021-05-25 ENCOUNTER — Inpatient Hospital Stay: Payer: Medicare HMO | Attending: Internal Medicine

## 2021-05-25 VITALS — BP 130/55 | HR 69 | Temp 97.9°F | Resp 17 | Ht 65.0 in | Wt 92.6 lb

## 2021-05-25 DIAGNOSIS — Z5111 Encounter for antineoplastic chemotherapy: Secondary | ICD-10-CM

## 2021-05-25 DIAGNOSIS — C7951 Secondary malignant neoplasm of bone: Secondary | ICD-10-CM | POA: Insufficient documentation

## 2021-05-25 DIAGNOSIS — C3491 Malignant neoplasm of unspecified part of right bronchus or lung: Secondary | ICD-10-CM

## 2021-05-25 DIAGNOSIS — R2241 Localized swelling, mass and lump, right lower limb: Secondary | ICD-10-CM

## 2021-05-25 DIAGNOSIS — C787 Secondary malignant neoplasm of liver and intrahepatic bile duct: Secondary | ICD-10-CM | POA: Diagnosis not present

## 2021-05-25 DIAGNOSIS — C7971 Secondary malignant neoplasm of right adrenal gland: Secondary | ICD-10-CM | POA: Diagnosis not present

## 2021-05-25 DIAGNOSIS — C3411 Malignant neoplasm of upper lobe, right bronchus or lung: Secondary | ICD-10-CM | POA: Diagnosis not present

## 2021-05-25 DIAGNOSIS — I824Y1 Acute embolism and thrombosis of unspecified deep veins of right proximal lower extremity: Secondary | ICD-10-CM | POA: Diagnosis not present

## 2021-05-25 DIAGNOSIS — Z79899 Other long term (current) drug therapy: Secondary | ICD-10-CM | POA: Insufficient documentation

## 2021-05-25 LAB — CMP (CANCER CENTER ONLY)
ALT: 8 U/L (ref 0–44)
AST: 19 U/L (ref 15–41)
Albumin: 3.5 g/dL (ref 3.5–5.0)
Alkaline Phosphatase: 52 U/L (ref 38–126)
Anion gap: 7 (ref 5–15)
BUN: 11 mg/dL (ref 8–23)
CO2: 27 mmol/L (ref 22–32)
Calcium: 8.7 mg/dL — ABNORMAL LOW (ref 8.9–10.3)
Chloride: 99 mmol/L (ref 98–111)
Creatinine: 0.49 mg/dL (ref 0.44–1.00)
GFR, Estimated: >60 mL/min (ref >60)
Glucose, Bld: 106 mg/dL — ABNORMAL HIGH (ref 70–99)
Potassium: 3.9 mmol/L (ref 3.5–5.1)
Sodium: 133 mmol/L — ABNORMAL LOW (ref 135–145)
Total Bilirubin: 0.4 mg/dL (ref 0.3–1.2)
Total Protein: 6.7 g/dL (ref 6.5–8.1)

## 2021-05-25 LAB — CBC WITH DIFFERENTIAL (CANCER CENTER ONLY)
Abs Immature Granulocytes: 0.01 10*3/uL (ref 0.00–0.07)
Basophils Absolute: 0 10*3/uL (ref 0.0–0.1)
Basophils Relative: 0 %
Eosinophils Absolute: 0.3 10*3/uL (ref 0.0–0.5)
Eosinophils Relative: 5 %
HCT: 30.4 % — ABNORMAL LOW (ref 36.0–46.0)
Hemoglobin: 10.1 g/dL — ABNORMAL LOW (ref 12.0–15.0)
Immature Granulocytes: 0 %
Lymphocytes Relative: 16 %
Lymphs Abs: 0.8 10*3/uL (ref 0.7–4.0)
MCH: 31.1 pg (ref 26.0–34.0)
MCHC: 33.2 g/dL (ref 30.0–36.0)
MCV: 93.5 fL (ref 80.0–100.0)
Monocytes Absolute: 0.8 10*3/uL (ref 0.1–1.0)
Monocytes Relative: 16 %
Neutro Abs: 3 10*3/uL (ref 1.7–7.7)
Neutrophils Relative %: 63 %
Platelet Count: 300 10*3/uL (ref 150–400)
RBC: 3.25 MIL/uL — ABNORMAL LOW (ref 3.87–5.11)
RDW: 14.5 % (ref 11.5–15.5)
WBC Count: 4.8 10*3/uL (ref 4.0–10.5)
nRBC: 0 % (ref 0.0–0.2)

## 2021-05-25 LAB — TSH: TSH: 2.392 u[IU]/mL (ref 0.308–3.960)

## 2021-05-25 MED ORDER — ENOXAPARIN SODIUM 60 MG/0.6ML IJ SOSY: 60.0000 mg | PREFILLED_SYRINGE | INTRAMUSCULAR | 2 refills | Status: DC

## 2021-05-25 MED ORDER — MORPHINE SULFATE ER 30 MG PO TBCR: 30.0000 mg | EXTENDED_RELEASE_TABLET | Freq: Two times a day (BID) | ORAL | 0 refills | Status: DC

## 2021-05-25 MED ORDER — SODIUM CHLORIDE 0.9 % IV SOLN
500.0000 mg/m2 | Freq: Once | INTRAVENOUS | Status: AC
  Administered 2021-05-25: 700 mg via INTRAVENOUS
  Filled 2021-05-25: qty 20

## 2021-05-25 MED ORDER — SODIUM CHLORIDE 0.9 % IV SOLN: Freq: Once | INTRAVENOUS | Status: DC

## 2021-05-25 NOTE — Progress Notes (Signed)
Per Provider patient is only getting Alimta today

## 2021-05-25 NOTE — Patient Instructions (Addendum)
[facility]   Discharge Instructions: Thank you for choosing [facility] to provide your oncology and hematology care.   If you have a lab appointment with the [facility], please go directly to the [facility] and check in at the registration area.   Wear comfortable clothing and clothing appropriate for easy access to any Portacath or PICC line.   We strive to give you quality time with your provider. You may need to reschedule your appointment if you arrive late (15 or more minutes).  Arriving late affects you and other patients whose appointments are after yours.  Also, if you miss three or more appointments without notifying the office, you may be dismissed from the clinic at the providers discretion.      For prescription refill requests, have your pharmacy contact our office and allow 72 hours for refills to be completed.    Today you received the following chemotherapy and/or immunotherapy agents: pemetrexed      To help prevent nausea and vomiting after your treatment, we encourage you to take your nausea medication as directed.  BELOW ARE SYMPTOMS THAT SHOULD BE REPORTED IMMEDIATELY: *FEVER GREATER THAN 100.4 F (38 C) OR HIGHER *CHILLS OR SWEATING *NAUSEA AND VOMITING THAT IS NOT CONTROLLED WITH YOUR NAUSEA MEDICATION *UNUSUAL SHORTNESS OF BREATH *UNUSUAL BRUISING OR BLEEDING *URINARY PROBLEMS (pain or burning when urinating, or frequent urination) *BOWEL PROBLEMS (unusual diarrhea, constipation, pain near the anus) TENDERNESS IN MOUTH AND THROAT WITH OR WITHOUT PRESENCE OF ULCERS (sore throat, sores in mouth, or a toothache) UNUSUAL RASH, SWELLING OR PAIN  UNUSUAL VAGINAL DISCHARGE OR ITCHING   Items with * indicate a potential emergency and should be followed up as soon as possible or go to the Emergency Department if any problems should occur.  Please show the CHEMOTHERAPY ALERT CARD or IMMUNOTHERAPY ALERT CARD at check-in  to the Emergency Department and triage nurse.  Should you have questions after your visit or need to cancel or reschedule your appointment, please contact [facility]  Dept: [phone]  and follow the prompts.  Office hours are 8:00 a.m. to 4:30 p.m. Monday - Friday. Please note that voicemails left after 4:00 p.m. may not be returned until the following business day.  We are closed weekends and major holidays. You have access to a nurse at all times for urgent questions. Please call the main number to the clinic Dept: [phone] and follow the prompts.   For any non-urgent questions, you may also contact your provider using MyChart. We now offer e-Visits for anyone 17 and older to request care online for non-urgent symptoms. For details visit [email or web address].   Also download the MyChart app! Go to the app store, search "MyChart", open the app, select [facility], and log in with your MyChart username and password.  Due to Covid, a mask is required upon entering the hospital/clinic. If you do not have a mask, one will be given to you upon arrival. For doctor visits, patients may have 1 support person aged 73 or older with them. For treatment visits, patients cannot have anyone with them due to current Covid guidelines and our immunocompromised population.  Enoxaparin Injection What is this medication? ENOXAPARIN (ee nox a PA rin) prevents or treats blood clots. It belongs to a group of medications called blood thinners. This medicine may be used for other purposes; ask your health care provider or pharmacist if you have questions. COMMON BRAND NAME(S):  Lovenox What should I tell my care team before I take this medication? They need to know if you have any of these conditions: Bleeding disorders, hemorrhage, or hemophilia Infection of the heart or heart valves Kidney or liver disease Previous stroke Prosthetic heart valve Recent surgery or delivery of a  baby Ulcer in the stomach or intestine, diverticulitis, or other bowel disease An unusual or allergic reaction to enoxaparin, heparin, pork or pork products, other medications, foods, dyes, or preservatives Pregnant or trying to get pregnant Breast-feeding How should I use this medication? This medication is injected under the skin. You will be taught how to prepare and give it. For your therapy to work as well as possible, take each dose exactly as prescribed on the prescription label. Do not skip doses. Skipping or stopping this medication can increase your risk of a blood clot. Keep taking this medication unless your care team tells you to stop. It is important that you put your used needles and syringes in a special sharps container. Do not put them in a trash can. If you do not have a sharps container, call your care team to get one. This medication comes with INSTRUCTIONS FOR USE. Ask your pharmacist for directions on how to use this medicine. Read the information carefully. Talk to your care team if you have questions. Talk to your care team about use of this medication in children. Special care may be needed. Overdosage: If you think you have taken too much of this medicine contact a poison control center or emergency room at once. NOTE: This medicine is only for you. Do not share this medicine with others. What if I miss a dose? If you miss a dose, take it as soon as you can. If it is almost time for your next dose, take only that dose. Do not take double or extra doses. What may interact with this medication? Aspirin and aspirin-like medications Certain medications that treat or prevent blood clots Dipyridamole NSAIDs, medications for pain and inflammation, like ibuprofen or naproxen This list may not describe all possible interactions. Give your health care provider a list of all the medicines, herbs, non-prescription drugs, or dietary supplements you use. Also tell them if you smoke,  drink alcohol, or use illegal drugs. Some items may interact with your medicine. What should I watch for while using this medication? Visit your care team for regular checks on your progress. You may need blood work done while you are taking this medication. Your condition will be monitored carefully while you are receiving this medication. It is important not to miss any appointments. If you are going to need surgery or other procedure, tell your care team that you are using this medication. Using this medication for a long time may weaken your bones and increase the risk of bone fractures. Avoid sports and activities that might cause injury while you are using this medication. Severe falls or injuries can cause unseen bleeding. Be careful when using sharp tools or knives. Consider using an Copy. Take special care brushing or flossing your teeth. Report any injuries, bruising, or red spots on the skin to your care team. Wear a medical ID bracelet or chain. Carry a card that describes your disease and details of your medication and dosage times. What side effects may I notice from receiving this medication? Side effects that you should report to your care team as soon as possible: Allergic reactions--skin rash, itching, hives, swelling of the face, lips, tongue,  or throat Bleeding--bloody or black, tar-like stools, vomiting blood or brown material that looks like coffee grounds, red or dark brown urine, small red or purple spots on skin, unusual bruising or bleeding Side effects that usually do not require medical attention (report to your care team if they continue or are bothersome): Pain, redness, or irritation at the injection site Swelling of the ankles, hands, or feet This list may not describe all possible side effects. Call your doctor for medical advice about side effects. You may report side effects to FDA at 1-800-FDA-1088. Where should I keep my medication? Keep out of the reach  of children. Store at room temperature between 15 and 30 degrees C (59 and 86 degrees F). Do not freeze. If your injections have been specially prepared, you may need to store them in the refrigerator. Ask your pharmacist. Throw away any unused medication after the expiration date. NOTE: This sheet is a summary. It may not cover all possible information. If you have questions about this medicine, talk to your doctor, pharmacist, or health care provider.  2022 Elsevier/Gold Standard (2020-07-16 00:00:00)

## 2021-05-25 NOTE — Progress Notes (Signed)
Nutrition Follow-up:  Patient currently receiving maintenance Alimta q3 weeks and zometa q6 weeks for stage IV non-small cell lung cancer. Patient also receiving palliative radiation to enlarging pulmonary mass and adrenal lesion under care of Dr. Sondra Come.   Met with patient during infusion. Patient is feeling overwhelmed today after learning she will be starting daily Lovenox injections after developing acute lower extremity DVT while taking Eliquis for previous PE. Patient reports her appetite has improved since starting Marinol. She is eating more, recalls lots of pastas and pastries. Patient is drinking premier protein. Her husband often blends with ice cream to make high calorie shakes.   Medications: reviewed   Labs: Na 133, Glucose 106  Anthropometrics: Weight 92 lb 9.6 lb today   12/30 - 92 lb 9.6 oz 12/14 - 94 lb 4.8 oz  11/23 - 91 lb 2 oz    NUTRITION DIAGNOSIS: Unintended weight loss stable. Severe malnutrition continues   INTERVENTION:  Encouraged high calorie high protein foods to promote weight gain Provided shake recipes Continue taking Marinol as prescribed Provided support and encouragement   MONITORING, EVALUATION, GOAL: weight trends, intake   NEXT VISIT: Wednesday February 15 during infusion

## 2021-05-27 ENCOUNTER — Other Ambulatory Visit: Payer: Self-pay | Admitting: Physician Assistant

## 2021-05-27 ENCOUNTER — Telehealth: Payer: Self-pay

## 2021-05-27 DIAGNOSIS — M064 Inflammatory polyarthropathy: Secondary | ICD-10-CM | POA: Diagnosis not present

## 2021-05-27 DIAGNOSIS — M150 Primary generalized (osteo)arthritis: Secondary | ICD-10-CM | POA: Diagnosis not present

## 2021-05-27 DIAGNOSIS — M353 Polymyalgia rheumatica: Secondary | ICD-10-CM | POA: Diagnosis not present

## 2021-05-27 DIAGNOSIS — C3490 Malignant neoplasm of unspecified part of unspecified bronchus or lung: Secondary | ICD-10-CM | POA: Diagnosis not present

## 2021-05-27 DIAGNOSIS — M79641 Pain in right hand: Secondary | ICD-10-CM | POA: Diagnosis not present

## 2021-05-27 DIAGNOSIS — R768 Other specified abnormal immunological findings in serum: Secondary | ICD-10-CM | POA: Diagnosis not present

## 2021-05-27 DIAGNOSIS — C3491 Malignant neoplasm of unspecified part of right bronchus or lung: Secondary | ICD-10-CM

## 2021-05-27 DIAGNOSIS — M154 Erosive (osteo)arthritis: Secondary | ICD-10-CM | POA: Diagnosis not present

## 2021-05-27 DIAGNOSIS — M2550 Pain in unspecified joint: Secondary | ICD-10-CM | POA: Diagnosis not present

## 2021-05-27 DIAGNOSIS — R2241 Localized swelling, mass and lump, right lower limb: Secondary | ICD-10-CM

## 2021-05-27 DIAGNOSIS — M79642 Pain in left hand: Secondary | ICD-10-CM | POA: Diagnosis not present

## 2021-05-27 DIAGNOSIS — M791 Myalgia, unspecified site: Secondary | ICD-10-CM | POA: Diagnosis not present

## 2021-05-27 NOTE — Telephone Encounter (Signed)
[facility] called stating they have prescribed the pt Prednisone 10mg  QD and the pt is requesting the [facility] confirm it is ok for her to take.   Discussed with cassandra PA-C and I have called the pt/husband and advised it ok for pt to take as prescribed. Pts husband expressed understanding of this information.

## 2021-05-30 ENCOUNTER — Other Ambulatory Visit: Payer: Self-pay

## 2021-05-30 ENCOUNTER — Ambulatory Visit (HOSPITAL_COMMUNITY)
Admission: RE | Admit: 2021-05-30 | Discharge: 2021-05-30 | Disposition: A | Discharge status: Home / Self Care | Payer: Medicare HMO | Source: Ambulatory Visit | Attending: Physician Assistant | Admitting: Physician Assistant

## 2021-05-30 DIAGNOSIS — C3491 Malignant neoplasm of unspecified part of right bronchus or lung: Secondary | ICD-10-CM | POA: Insufficient documentation

## 2021-05-30 DIAGNOSIS — M25461 Effusion, right knee: Secondary | ICD-10-CM | POA: Diagnosis not present

## 2021-05-30 DIAGNOSIS — M1711 Unilateral primary osteoarthritis, right knee: Secondary | ICD-10-CM | POA: Diagnosis not present

## 2021-05-30 DIAGNOSIS — R2241 Localized swelling, mass and lump, right lower limb: Secondary | ICD-10-CM | POA: Insufficient documentation

## 2021-05-30 MED ORDER — IOHEXOL 350 MG/ML SOLN
60.0000 mL | Freq: Once | INTRAVENOUS | Status: AC | PRN
  Administered 2021-05-30: 60 mL via INTRAVENOUS

## 2021-05-31 ENCOUNTER — Telehealth: Payer: Self-pay | Admitting: Physician Assistant

## 2021-05-31 NOTE — Telephone Encounter (Signed)
I called the patient to review her CT of the knee to evaluate the suspicious mass behind the right knee. 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 It also noted arthritis and incidental loose body. The patient is asymptomatic. Discussed no intervention is needed regarding this. The patient is going to start her lovenox today for her DVT. She had a lot of questions regarding her DVT and why this was not checked on the CT scan of her knee. Tried to explain that we know her DVT is there based on her recent doppler ultrasound, especially since she did not start her lovenox yet. It does not change the plan. Discussed again we expect for her to be on this for at least 1 year per Dr. Julien Nordmann.

## 2021-05-31 NOTE — Progress Notes (Signed)
Radiation Oncology         [phone] ________________________________  Outpatient Re-Consultation  Name: Kristi Jennings MRN: [id number]  Date: 06/01/2021  DOB: 21-Dec-1945  JU:VQQUIV, Valaria Good., MD  Curt Bears, MD   REFERRING PHYSICIAN: Curt Bears, MD  DIAGNOSIS: The primary encounter diagnosis was Non-small cell carcinoma of lung, stage 4, right ([place]). A diagnosis of Metastatic bone tumor ([place]) was also pertinent to this visit.  The encounter diagnosis was Non-small cell carcinoma of lung, stage 4, right ([place]).   Stage IV (T3, N2, M1 C) non-small cell lung cancer with unknown histologic subtype.  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  Interval Since Last Radiation: 5 months and 6 days    Treatment Dates: 12/13/20- 12/24/2020   Site/Dose: Chest_Rt - 30.00 of 30.00Gy, 10 of 10 fractions delivered; 3.00 Gy/Fx  Narrative/Interval History: The patient returns today for further discussion of palliative radiation therapy, she was last seen here on 05/19/21. To review from out last visit, the patient was hesitant to make a decision concerning treatment given her presenting issues of right leg swelling and probable cellulitis, and wished to have further evaluation of this issue before making a final decision. We also reviewed the patient's recent imaging studies which showed slight progression in the right upper lung mass and right adrenal gland area.  We discussed potential treatments to these areas.  I informed the patient that the area in the right adrenal gland would technically be difficult to treat with surrounding liver, kidney, and bowel structures. That being said, I told her that I would be hesitant to treat this area unless it progresses and causes symptoms.  I also informed her that she would be a candidate for  retreatment to the right lung mass given that she only had a palliative dose of radiation therapy earlier last year (2022).   The patient underwent a LV DVT on 05/19/21 which revealed findings consistent with acute deep vein thrombosis involving the right peroneal veins. [place] also showed a heterogenous structure in the popliteal fossa, measuring at least 4.3 x 3.9 cm, with internal low-resistant vascularity. Differentials were noted to include neoplasm versus unknown etiology in regards to this finding.  The patient accordingly followed up with [place] Crane, PA-C, on 05/20/21 for further evaluation of her DVT. Given that the patient developed DVT on Eliquis, she was switched to Xarelto. The patient's right leg erythema was also noted to have improved with doxycycline (she was started on doxycycline during her last clinic visit). Given improvement of her right leg swelling, cellulitis was noted as unlikely, however, the patient was still encouraged to finish antibiotic therapy.   On 05/25/20, the patient met with Cassandra Heilingoetter PA-C to discuss the abnormal structure about the popliteal fossa seen on DVT study. In respects to this finding, Dr. Julien Nordmann recommended CT of the knee for further evaluation. The patient also proceeded with cycle #8 of her systemic treatment (with Alimta only) during this visit.  CT of the right knee on 05/30/21 demonstrated a well-defined fluid collection at the posteromedial aspect of the knee, extending into the proximal calf, as compatible with a Baker's cyst, and measuring approximately 12.0 x 1.5 x 2.8 cm. Tricompartmental osteoarthritis of the right knee was also appreciated, which was noted as moderate within the medial and patellofemoral compartments. (A 1.5 cm ossified loose  body within the subpopliteal joint recess was also noted).   (The patient will continue taking Zometa every 6 weeks for metastatic bone lesions).    Systemic therapy:  Palliative  systemic chemotherapy with carboplatin for AUC of 5, Alimta 500 Mg/M2 and Keytruda 200 Mg IV every 3 weeks.  First dose of treatment on 12/29/20. Starting from cycle #5, the patient started maintenance Alimta and Keytruda. Keytruda discontinued from cycle #7 due to myalgias. Zometa started on 02/10/21 every 6 weeks.   PAST MEDICAL HISTORY:  Past Medical History:  Diagnosis Date   Borderline diabetes mellitus    Bronchogenic cancer of right lung (HCC)    Dyslipidemia    Hypertension    Polymyalgia rheumatica ([place])     PAST SURGICAL HISTORY:History reviewed. No pertinent surgical history.  FAMILY HISTORY:  Family History  Problem Relation Age of Onset   Heart attack Mother    Heart disease Mother    Colon cancer Father    ALS Brother     SOCIAL HISTORY:  Social History   Tobacco Use   Smoking status: Former    Packs/day: 1.00    Years: 43.00    Pack years: 43.00    Types: Cigarettes    Quit date: 11/23/2008    Years since quitting: 12.5   Smokeless tobacco: Never   Tobacco comments:    quit 2010  Vaping Use   Vaping Use: Never used  Substance Use Topics   Alcohol use: Not Currently   Drug use: Never    ALLERGIES:  Allergies  Allergen Reactions   Shellfish-Derived Products Hives, Itching and Rash    Other reaction(s): Hives    Augmentin [Amoxicillin-Pot Clavulanate] Nausea And Vomiting    MEDICATIONS:  Current Outpatient Medications  Medication Sig Dispense Refill   acetaminophen (TYLENOL) 650 MG CR tablet Take 1,300 mg by mouth 3 (three) times daily as needed for pain.     aspirin EC 81 MG tablet Take 81 mg by mouth 4 (four) times a week. Swallow whole.     atenolol (TENORMIN) 25 MG tablet Take 12.5 mg by mouth at bedtime.     Calcium-Vitamin D-Vitamin K (CALCIUM + D) [phone] MG-UNT-MCG CHEW      Cholecalciferol (VITAMIN D) 50 MCG (2000 UT) tablet      diclofenac Sodium (VOLTAREN) 1 % GEL Apply 4 g topically 4 (four) times daily. (Patient taking  differently: Apply 4 g topically 4 (four) times daily as needed (pain).) 150 g 0   dronabinol (MARINOL) 2.5 MG capsule Take 1 capsule (2.5 mg total) by mouth 2 (two) times daily before a meal. 60 capsule 0   enoxaparin (LOVENOX) 60 MG/0.6ML injection Inject 0.6 mLs (60 mg total) into the skin daily. 18 mL 2   escitalopram (LEXAPRO) 20 MG tablet Take 20 mg by mouth at bedtime.     folic acid (FOLVITE) 1 MG tablet TAKE ONE TABLET BY MOUTH ONE TIME DAILY 30 tablet 2   HYDROmorphone (DILAUDID) 4 MG tablet Take 1 tablet (4 mg total) by mouth every 6 (six) hours as needed for severe pain or moderate pain. 120 tablet 0   morphine (MS CONTIN) 30 MG 12 hr tablet Take 1 tablet (30 mg total) by mouth every 12 (twelve) hours. 60 tablet 0   Multiple Vitamin (MULTI VITAMIN) TABS      hydrOXYzine (ATARAX) 10 MG tablet Take 1 tablet (10 mg total) by mouth 3 (three) times daily as needed. 30 tablet 0   losartan (COZAAR) 50  MG tablet Take 50 mg by mouth daily. (Patient not taking: Reported on 05/19/2021)     methylPREDNISolone (MEDROL DOSEPAK) 4 MG TBPK tablet Use as instructed (Patient not taking: Reported on 05/19/2021) 21 tablet 0   ondansetron (ZOFRAN-ODT) 4 MG disintegrating tablet Take 4 mg by mouth 2 (two) times daily as needed for nausea or vomiting. (Patient not taking: Reported on 03/16/2021)     prochlorperazine (COMPAZINE) 10 MG tablet Take 1 tablet (10 mg total) by mouth every 6 (six) hours as needed for nausea or vomiting. (Patient not taking: Reported on 03/16/2021) 30 tablet 0   rosuvastatin (CRESTOR) 10 MG tablet Take 10 mg by mouth at bedtime. (Patient not taking: Reported on 05/19/2021)     No current facility-administered medications for this encounter.    REVIEW OF SYSTEMS:  A 10+ POINT REVIEW OF SYSTEMS WAS OBTAINED including neurology, dermatology, psychiatry, cardiac, respiratory, lymph, extremities, GI, GU, musculoskeletal, constitutional, reproductive, HEENT.  Reports new diffuse pain in  her spine and muscles particularly when standing up to walk.   PHYSICAL EXAM:  height is $RemoveB'5\' 5"'WDhCkivl$  (1.651 m) and weight is 92 lb 6 oz (41.9 kg). Her temporal temperature is 97.1 F (36.2 C) (abnormal). Her blood pressure is 120/56 (abnormal) and her pulse is 65. Her respiration is 18 and oxygen saturation is 95%.   General: Alert and oriented, in no acute distress HEENT: Head is normocephalic. Extraocular movements are intact. Neck: Neck is supple, no palpable cervical or supraclavicular lymphadenopathy. Heart: Regular in rate and rhythm with no murmurs, rubs, or gallops. Chest: Clear to auscultation bilaterally, with no rhonchi, wheezes, or rales. Abdomen: Soft, nontender, nondistended, with no rigidity or guarding. Patient is noted to be ambulating better since she was given prednisone by her rheumatologist   ECOG = 2  0 - Asymptomatic (Fully active, able to carry on all predisease activities without restriction)  1 - Symptomatic but completely ambulatory (Restricted in physically strenuous activity but ambulatory and able to carry out work of a light or sedentary nature. For example, light housework, office work)  2 - Symptomatic, <50% in bed during the day (Ambulatory and capable of all self care but unable to carry out any work activities. Up and about more than 50% of waking hours)  3 - Symptomatic, >50% in bed, but not bedbound (Capable of only limited self-care, confined to bed or chair 50% or more of waking hours)  4 - Bedbound (Completely disabled. Cannot carry on any self-care. Totally confined to bed or chair)  5 - Death   Eustace Pen MM, Creech RH, Tormey DC, et al. [phone]). "Toxicity and response criteria of the [facility]". [place] Oncol. 5 (6): 649-55  LABORATORY DATA:  Lab Results  Component Value Date   WBC 4.8 05/25/2021   HGB 10.1 (L) 05/25/2021   HCT 30.4 (L) 05/25/2021   MCV 93.5 05/25/2021   PLT 300 05/25/2021   NEUTROABS 3.0 05/25/2021    Lab Results  Component Value Date   NA 133 (L) 05/25/2021   K 3.9 05/25/2021   CL 99 05/25/2021   CO2 27 05/25/2021   GLUCOSE 106 (H) 05/25/2021   CREATININE 0.49 05/25/2021   CALCIUM 8.7 (L) 05/25/2021      RADIOGRAPHY: CT KNEE RIGHT W CONTRAST  Result Date: 05/31/2021 CLINICAL DATA:  Technologist note reports right leg mass. Patient reports she discovered something behind her right knee on DVT ultrasound. EXAM: CT OF THE RIGHT KNEE WITH CONTRAST TECHNIQUE: Multidetector CT  imaging was performed following the standard protocol during bolus administration of intravenous contrast. CONTRAST:  79mL OMNIPAQUE IOHEXOL 350 MG/ML SOLN COMPARISON:  None. FINDINGS: Bones/Joint/Cartilage No acute fracture. No dislocation. Tricompartmental osteoarthritis of the right knee, moderate within the medial and patellofemoral compartments with joint space narrowing and small marginal osteophytes. No knee joint effusion. Bony structures appear demineralized. Enthesopathy at the quadriceps tendon insertion on the superior patella. There is a 15 x 8 mm ossified loose body within the subpopliteal joint recess. Ligaments Suboptimally assessed by CT. Muscles and Tendons Musculotendinous structures appear intact by CT. Soft tissues Well-defined fluid collection at the posteromedial aspect of the knee extending into the proximal calf measures approximately 12.0 x 1.5 x 2.8 cm. Collection appears to arise from the posterior joint line of the knee between the medial head of the gastrocnemius and semimembranosus tendons, compatible with a Baker's cyst. Collection appears to extend partially intramuscular within the medial gastrocnemius. No evidence of a solid mass. Prominent atherosclerotic vascular calcifications. IMPRESSION: 1. Well-defined fluid collection at the posteromedial aspect of the knee extending into the proximal calf measures approximately 12.0 x 1.5 x 2.8 cm, compatible with a Baker's cyst. 2. Tricompartmental  osteoarthritis of the right knee, moderate within the medial and patellofemoral compartments. 3. Incidental note of a 1.5 cm ossified loose body within the subpopliteal joint recess. Electronically Signed   By: Davina Poke D.O.   On: 05/31/2021 09:52   VAS [place] LOWER EXTREMITY VENOUS (DVT)  Result Date: 05/19/2021  Lower Venous DVT Study Patient Name:  Kristi Jennings [facility]  Date of Exam:   05/19/2021 Medical Rec #: [id number]      Accession #:    [id number] Date of Birth: 11/04/1945      Patient Gender: F Patient Age:   52 years Exam Location:  [facility] Procedure:      VAS [place] LOWER EXTREMITY VENOUS (DVT) Referring Phys: Sherol Dade --------------------------------------------------------------------------------  Indications: Edema.  Limitations: Poor ultrasound/tissue interface. Comparison Study: No prior study Performing Technologist: Maudry Mayhew MHA, RDMS, RVT, RDCS  Examination Guidelines: A complete evaluation includes B-mode imaging, spectral Doppler, color Doppler, and power Doppler as needed of all accessible portions of each vessel. Bilateral testing is considered an integral part of a complete examination. Limited examinations for reoccurring indications may be performed as noted. The reflux portion of the exam is performed with the patient in reverse Trendelenburg.  +---------+---------------+---------+-----------+----------+--------------+  RIGHT     Compressibility Phasicity Spontaneity Properties Thrombus Aging  +---------+---------------+---------+-----------+----------+--------------+  CFV       Full            Yes       Yes                                    +---------+---------------+---------+-----------+----------+--------------+  SFJ       Full                                                             +---------+---------------+---------+-----------+----------+--------------+  FV Prox   Full                                                              +---------+---------------+---------+-----------+----------+--------------+  FV Mid    Full                                                             +---------+---------------+---------+-----------+----------+--------------+  FV Distal Full                                                             +---------+---------------+---------+-----------+----------+--------------+  PFV       Full                                                             +---------+---------------+---------+-----------+----------+--------------+  POP       Full            Yes       Yes                                    +---------+---------------+---------+-----------+----------+--------------+  PTV       Full                                                             +---------+---------------+---------+-----------+----------+--------------+  PERO      None                      No                     Acute           +---------+---------------+---------+-----------+----------+--------------+   +----+---------------+---------+-----------+----------+--------------+  LEFT Compressibility Phasicity Spontaneity Properties Thrombus Aging  +----+---------------+---------+-----------+----------+--------------+  CFV  Full            Yes       Yes                                    +----+---------------+---------+-----------+----------+--------------+     Summary: RIGHT: - Findings consistent with acute deep vein thrombosis involving the right peroneal veins. - A heterogenous structure is found in the popliteal fossa measuring at least 4.3 x 3.9cm with internal low-resistant vascularity. This is suggestive of neoplasm versus unknown etiology. Further imaging may be warranted if clinically indicated.  LEFT: - No evidence of common femoral vein obstruction.  *See table(s) above for measurements and observations. Electronically signed by Harold Barban MD on 05/19/2021 at 8:01:58 PM.    Final       IMPRESSION: The encounter diagnosis was  Non-small cell carcinoma of lung, stage 4, right ([place]).   Stage IV (T3, N2, M1 C) non-small cell lung cancer with unknown histologic subtype.  This was diagnosed in  June 2022 and presented with large cavitary right upper lobe lung mass in addition to mediastinal lymphadenopathy and metastatic disease to the bones as well as right adrenal gland.   On further evaluation patient was found to have a Baker's cyst involving the right knee and not metastatic cancer.  She has been started on additional anticoagulation given her DVT.  We discussed further palliative radiation therapy directed at the right chest and at this point the patient is willing to proceed with this therapy.  Anticipate 8-10 treatments.  Recommend just following the adrenal gland at this point and could treat later if this becomes more of an issue.    We reviewed the anticipated acute and late sequelae associated with radiation in this setting.  The patient was encouraged to ask questions that I answered to the best of my ability.  A patient consent form was discussed and signed.  We retained a copy for our records.  The patient would like to proceed with radiation and will be scheduled for CT simulation.  PLAN: She will return tomorrow for CT simulation.  Treatments to begin approximately a week.  2 weeks of radiation therapy anticipated directed at the progress of right upper lung mass   25 minutes of total time was spent for this patient encounter, including preparation, face-to-face counseling with the patient and coordination of care, physical exam, and documentation of the encounter. ------------------------------------------------  Blair Promise, PhD, MD  This document serves as a record of services personally performed by Gery Pray, MD. It was created on his behalf by Roney Mans, a trained medical scribe. The creation of this record is based on the scribe's personal observations and the provider's statements to them.  This document has been checked and approved by the attending provider.

## 2021-06-01 ENCOUNTER — Other Ambulatory Visit: Payer: Self-pay

## 2021-06-01 ENCOUNTER — Encounter: Payer: Self-pay | Admitting: Radiation Oncology

## 2021-06-01 ENCOUNTER — Ambulatory Visit
Admission: RE | Admit: 2021-06-01 | Discharge: 2021-06-01 | Disposition: A | Discharge status: Home / Self Care | Payer: Medicare HMO | Source: Ambulatory Visit | Attending: Radiation Oncology | Admitting: Radiation Oncology

## 2021-06-01 VITALS — BP 120/56 | HR 65 | Temp 97.1°F | Resp 18 | Ht 65.0 in | Wt 92.4 lb

## 2021-06-01 DIAGNOSIS — I10 Essential (primary) hypertension: Secondary | ICD-10-CM | POA: Insufficient documentation

## 2021-06-01 DIAGNOSIS — C3491 Malignant neoplasm of unspecified part of right bronchus or lung: Secondary | ICD-10-CM

## 2021-06-01 DIAGNOSIS — Z923 Personal history of irradiation: Secondary | ICD-10-CM | POA: Insufficient documentation

## 2021-06-01 DIAGNOSIS — Z87891 Personal history of nicotine dependence: Secondary | ICD-10-CM | POA: Insufficient documentation

## 2021-06-01 DIAGNOSIS — C3411 Malignant neoplasm of upper lobe, right bronchus or lung: Secondary | ICD-10-CM | POA: Diagnosis not present

## 2021-06-01 DIAGNOSIS — C7971 Secondary malignant neoplasm of right adrenal gland: Secondary | ICD-10-CM | POA: Insufficient documentation

## 2021-06-01 DIAGNOSIS — C7951 Secondary malignant neoplasm of bone: Secondary | ICD-10-CM | POA: Diagnosis not present

## 2021-06-01 DIAGNOSIS — E119 Type 2 diabetes mellitus without complications: Secondary | ICD-10-CM | POA: Insufficient documentation

## 2021-06-01 DIAGNOSIS — Z79899 Other long term (current) drug therapy: Secondary | ICD-10-CM | POA: Diagnosis not present

## 2021-06-01 DIAGNOSIS — Z7901 Long term (current) use of anticoagulants: Secondary | ICD-10-CM | POA: Insufficient documentation

## 2021-06-01 DIAGNOSIS — M1711 Unilateral primary osteoarthritis, right knee: Secondary | ICD-10-CM | POA: Insufficient documentation

## 2021-06-01 DIAGNOSIS — E785 Hyperlipidemia, unspecified: Secondary | ICD-10-CM | POA: Insufficient documentation

## 2021-06-01 DIAGNOSIS — M353 Polymyalgia rheumatica: Secondary | ICD-10-CM | POA: Diagnosis not present

## 2021-06-01 DIAGNOSIS — Z86718 Personal history of other venous thrombosis and embolism: Secondary | ICD-10-CM | POA: Diagnosis not present

## 2021-06-01 MED ORDER — HYDROXYZINE HCL 10 MG PO TABS: 10.0000 mg | ORAL_TABLET | Freq: Three times a day (TID) | ORAL | 0 refills | Status: DC | PRN

## 2021-06-01 NOTE — Progress Notes (Signed)
Patient presents for consent prior to treatment.  Vitals:   06/01/21 1101  BP: (!) 120/56  Pulse: 65  Resp: 18  Temp: (!) 97.1 F (36.2 C)  TempSrc: Temporal  SpO2: 95%  Weight: 92 lb 6 oz (41.9 kg)  Height: 5\' 5"  (1.651 m)

## 2021-06-02 ENCOUNTER — Ambulatory Visit
Admission: RE | Admit: 2021-06-02 | Discharge: 2021-06-02 | Disposition: A | Discharge status: Home / Self Care | Payer: Medicare HMO | Source: Ambulatory Visit | Attending: Radiation Oncology | Admitting: Radiation Oncology

## 2021-06-02 DIAGNOSIS — Z510 Encounter for antineoplastic radiation therapy: Secondary | ICD-10-CM | POA: Insufficient documentation

## 2021-06-02 DIAGNOSIS — Z87891 Personal history of nicotine dependence: Secondary | ICD-10-CM | POA: Diagnosis not present

## 2021-06-02 DIAGNOSIS — C3491 Malignant neoplasm of unspecified part of right bronchus or lung: Secondary | ICD-10-CM

## 2021-06-02 DIAGNOSIS — C7971 Secondary malignant neoplasm of right adrenal gland: Secondary | ICD-10-CM | POA: Insufficient documentation

## 2021-06-02 DIAGNOSIS — C7951 Secondary malignant neoplasm of bone: Secondary | ICD-10-CM | POA: Insufficient documentation

## 2021-06-02 DIAGNOSIS — C3411 Malignant neoplasm of upper lobe, right bronchus or lung: Secondary | ICD-10-CM | POA: Insufficient documentation

## 2021-06-05 DIAGNOSIS — C3411 Malignant neoplasm of upper lobe, right bronchus or lung: Secondary | ICD-10-CM | POA: Diagnosis not present

## 2021-06-05 DIAGNOSIS — C7971 Secondary malignant neoplasm of right adrenal gland: Secondary | ICD-10-CM | POA: Diagnosis not present

## 2021-06-05 DIAGNOSIS — C7951 Secondary malignant neoplasm of bone: Secondary | ICD-10-CM | POA: Diagnosis not present

## 2021-06-05 DIAGNOSIS — Z510 Encounter for antineoplastic radiation therapy: Secondary | ICD-10-CM | POA: Diagnosis not present

## 2021-06-05 DIAGNOSIS — Z87891 Personal history of nicotine dependence: Secondary | ICD-10-CM | POA: Diagnosis not present

## 2021-06-07 ENCOUNTER — Ambulatory Visit
Admission: RE | Admit: 2021-06-07 | Discharge: 2021-06-07 | Disposition: A | Discharge status: Home / Self Care | Payer: Medicare HMO | Source: Ambulatory Visit | Attending: Radiation Oncology | Admitting: Radiation Oncology

## 2021-06-07 ENCOUNTER — Other Ambulatory Visit: Payer: Self-pay

## 2021-06-07 DIAGNOSIS — Z87891 Personal history of nicotine dependence: Secondary | ICD-10-CM | POA: Diagnosis not present

## 2021-06-07 DIAGNOSIS — Z510 Encounter for antineoplastic radiation therapy: Secondary | ICD-10-CM | POA: Diagnosis not present

## 2021-06-07 DIAGNOSIS — C3491 Malignant neoplasm of unspecified part of right bronchus or lung: Secondary | ICD-10-CM

## 2021-06-07 DIAGNOSIS — C3411 Malignant neoplasm of upper lobe, right bronchus or lung: Secondary | ICD-10-CM | POA: Diagnosis not present

## 2021-06-07 DIAGNOSIS — C7971 Secondary malignant neoplasm of right adrenal gland: Secondary | ICD-10-CM | POA: Diagnosis not present

## 2021-06-07 DIAGNOSIS — C7951 Secondary malignant neoplasm of bone: Secondary | ICD-10-CM | POA: Diagnosis not present

## 2021-06-08 ENCOUNTER — Ambulatory Visit
Admission: RE | Admit: 2021-06-08 | Discharge: 2021-06-08 | Disposition: A | Discharge status: Home / Self Care | Payer: Medicare HMO | Source: Ambulatory Visit | Attending: Radiation Oncology | Admitting: Radiation Oncology

## 2021-06-08 DIAGNOSIS — C3411 Malignant neoplasm of upper lobe, right bronchus or lung: Secondary | ICD-10-CM | POA: Diagnosis not present

## 2021-06-08 DIAGNOSIS — Z87891 Personal history of nicotine dependence: Secondary | ICD-10-CM | POA: Diagnosis not present

## 2021-06-08 DIAGNOSIS — C7971 Secondary malignant neoplasm of right adrenal gland: Secondary | ICD-10-CM | POA: Diagnosis not present

## 2021-06-08 DIAGNOSIS — Z510 Encounter for antineoplastic radiation therapy: Secondary | ICD-10-CM | POA: Diagnosis not present

## 2021-06-08 DIAGNOSIS — C3491 Malignant neoplasm of unspecified part of right bronchus or lung: Secondary | ICD-10-CM | POA: Diagnosis not present

## 2021-06-08 DIAGNOSIS — C7951 Secondary malignant neoplasm of bone: Secondary | ICD-10-CM | POA: Diagnosis not present

## 2021-06-09 ENCOUNTER — Ambulatory Visit
Admission: RE | Admit: 2021-06-09 | Discharge: 2021-06-09 | Disposition: A | Discharge status: Home / Self Care | Payer: Medicare HMO | Source: Ambulatory Visit | Attending: Radiation Oncology | Admitting: Radiation Oncology

## 2021-06-09 ENCOUNTER — Other Ambulatory Visit: Payer: Self-pay

## 2021-06-09 DIAGNOSIS — C7971 Secondary malignant neoplasm of right adrenal gland: Secondary | ICD-10-CM | POA: Diagnosis not present

## 2021-06-09 DIAGNOSIS — C7951 Secondary malignant neoplasm of bone: Secondary | ICD-10-CM | POA: Diagnosis not present

## 2021-06-09 DIAGNOSIS — Z510 Encounter for antineoplastic radiation therapy: Secondary | ICD-10-CM | POA: Diagnosis not present

## 2021-06-09 DIAGNOSIS — C3411 Malignant neoplasm of upper lobe, right bronchus or lung: Secondary | ICD-10-CM | POA: Diagnosis not present

## 2021-06-09 DIAGNOSIS — Z87891 Personal history of nicotine dependence: Secondary | ICD-10-CM | POA: Diagnosis not present

## 2021-06-10 ENCOUNTER — Ambulatory Visit
Admission: RE | Admit: 2021-06-10 | Discharge: 2021-06-10 | Disposition: A | Discharge status: Home / Self Care | Payer: Medicare HMO | Source: Ambulatory Visit | Attending: Radiation Oncology | Admitting: Radiation Oncology

## 2021-06-10 DIAGNOSIS — Z87891 Personal history of nicotine dependence: Secondary | ICD-10-CM | POA: Diagnosis not present

## 2021-06-10 DIAGNOSIS — C7971 Secondary malignant neoplasm of right adrenal gland: Secondary | ICD-10-CM | POA: Diagnosis not present

## 2021-06-10 DIAGNOSIS — Z510 Encounter for antineoplastic radiation therapy: Secondary | ICD-10-CM | POA: Diagnosis not present

## 2021-06-10 DIAGNOSIS — C7951 Secondary malignant neoplasm of bone: Secondary | ICD-10-CM | POA: Diagnosis not present

## 2021-06-10 DIAGNOSIS — C3411 Malignant neoplasm of upper lobe, right bronchus or lung: Secondary | ICD-10-CM | POA: Diagnosis not present

## 2021-06-13 ENCOUNTER — Ambulatory Visit
Admission: RE | Admit: 2021-06-13 | Discharge: 2021-06-13 | Disposition: A | Discharge status: Home / Self Care | Payer: Medicare HMO | Source: Ambulatory Visit | Attending: Radiation Oncology | Admitting: Radiation Oncology

## 2021-06-13 ENCOUNTER — Other Ambulatory Visit: Payer: Self-pay

## 2021-06-13 DIAGNOSIS — C3411 Malignant neoplasm of upper lobe, right bronchus or lung: Secondary | ICD-10-CM | POA: Diagnosis not present

## 2021-06-13 DIAGNOSIS — C7971 Secondary malignant neoplasm of right adrenal gland: Secondary | ICD-10-CM | POA: Diagnosis not present

## 2021-06-13 DIAGNOSIS — C7951 Secondary malignant neoplasm of bone: Secondary | ICD-10-CM | POA: Diagnosis not present

## 2021-06-13 DIAGNOSIS — Z510 Encounter for antineoplastic radiation therapy: Secondary | ICD-10-CM | POA: Diagnosis not present

## 2021-06-13 DIAGNOSIS — Z87891 Personal history of nicotine dependence: Secondary | ICD-10-CM | POA: Diagnosis not present

## 2021-06-14 ENCOUNTER — Ambulatory Visit
Admission: RE | Admit: 2021-06-14 | Discharge: 2021-06-14 | Disposition: A | Discharge status: Home / Self Care | Payer: Medicare HMO | Source: Ambulatory Visit | Attending: Radiation Oncology | Admitting: Radiation Oncology

## 2021-06-14 DIAGNOSIS — Z87891 Personal history of nicotine dependence: Secondary | ICD-10-CM | POA: Diagnosis not present

## 2021-06-14 DIAGNOSIS — C7971 Secondary malignant neoplasm of right adrenal gland: Secondary | ICD-10-CM | POA: Diagnosis not present

## 2021-06-14 DIAGNOSIS — C3491 Malignant neoplasm of unspecified part of right bronchus or lung: Secondary | ICD-10-CM | POA: Diagnosis not present

## 2021-06-14 DIAGNOSIS — Z510 Encounter for antineoplastic radiation therapy: Secondary | ICD-10-CM | POA: Diagnosis not present

## 2021-06-14 DIAGNOSIS — C7951 Secondary malignant neoplasm of bone: Secondary | ICD-10-CM | POA: Diagnosis not present

## 2021-06-14 DIAGNOSIS — C3411 Malignant neoplasm of upper lobe, right bronchus or lung: Secondary | ICD-10-CM | POA: Diagnosis not present

## 2021-06-15 ENCOUNTER — Ambulatory Visit: Admission: RE | Admit: 2021-06-15 | Payer: Medicare HMO | Source: Ambulatory Visit

## 2021-06-15 ENCOUNTER — Inpatient Hospital Stay (HOSPITAL_BASED_OUTPATIENT_CLINIC_OR_DEPARTMENT_OTHER): Payer: Medicare HMO | Admitting: Internal Medicine

## 2021-06-15 ENCOUNTER — Inpatient Hospital Stay: Payer: Medicare HMO

## 2021-06-15 ENCOUNTER — Other Ambulatory Visit: Payer: Self-pay

## 2021-06-15 ENCOUNTER — Inpatient Hospital Stay (HOSPITAL_BASED_OUTPATIENT_CLINIC_OR_DEPARTMENT_OTHER): Payer: Medicare HMO | Admitting: Nurse Practitioner

## 2021-06-15 ENCOUNTER — Encounter: Payer: Self-pay | Admitting: Internal Medicine

## 2021-06-15 ENCOUNTER — Encounter: Payer: Self-pay | Admitting: Nurse Practitioner

## 2021-06-15 VITALS — BP 138/79 | HR 76 | Temp 98.4°F | Resp 18 | Ht 65.0 in | Wt 89.4 lb

## 2021-06-15 DIAGNOSIS — C7971 Secondary malignant neoplasm of right adrenal gland: Secondary | ICD-10-CM | POA: Diagnosis not present

## 2021-06-15 DIAGNOSIS — C3491 Malignant neoplasm of unspecified part of right bronchus or lung: Secondary | ICD-10-CM

## 2021-06-15 DIAGNOSIS — Z515 Encounter for palliative care: Secondary | ICD-10-CM | POA: Diagnosis not present

## 2021-06-15 DIAGNOSIS — I10 Essential (primary) hypertension: Secondary | ICD-10-CM | POA: Diagnosis not present

## 2021-06-15 DIAGNOSIS — C3490 Malignant neoplasm of unspecified part of unspecified bronchus or lung: Secondary | ICD-10-CM

## 2021-06-15 DIAGNOSIS — G893 Neoplasm related pain (acute) (chronic): Secondary | ICD-10-CM | POA: Diagnosis not present

## 2021-06-15 DIAGNOSIS — Z79899 Other long term (current) drug therapy: Secondary | ICD-10-CM | POA: Diagnosis not present

## 2021-06-15 DIAGNOSIS — C7951 Secondary malignant neoplasm of bone: Secondary | ICD-10-CM | POA: Diagnosis not present

## 2021-06-15 DIAGNOSIS — K5903 Drug induced constipation: Secondary | ICD-10-CM | POA: Diagnosis not present

## 2021-06-15 DIAGNOSIS — C3411 Malignant neoplasm of upper lobe, right bronchus or lung: Secondary | ICD-10-CM

## 2021-06-15 DIAGNOSIS — R634 Abnormal weight loss: Secondary | ICD-10-CM | POA: Diagnosis not present

## 2021-06-15 DIAGNOSIS — Z5111 Encounter for antineoplastic chemotherapy: Secondary | ICD-10-CM | POA: Diagnosis not present

## 2021-06-15 DIAGNOSIS — C787 Secondary malignant neoplasm of liver and intrahepatic bile duct: Secondary | ICD-10-CM | POA: Diagnosis not present

## 2021-06-15 LAB — CBC WITH DIFFERENTIAL (CANCER CENTER ONLY)
Abs Immature Granulocytes: 0.03 10*3/uL (ref 0.00–0.07)
Basophils Absolute: 0 10*3/uL (ref 0.0–0.1)
Basophils Relative: 1 %
Eosinophils Absolute: 0.2 10*3/uL (ref 0.0–0.5)
Eosinophils Relative: 4 %
HCT: 32.4 % — ABNORMAL LOW (ref 36.0–46.0)
Hemoglobin: 10.2 g/dL — ABNORMAL LOW (ref 12.0–15.0)
Immature Granulocytes: 1 %
Lymphocytes Relative: 15 %
Lymphs Abs: 0.8 10*3/uL (ref 0.7–4.0)
MCH: 29.9 pg (ref 26.0–34.0)
MCHC: 31.5 g/dL (ref 30.0–36.0)
MCV: 95 fL (ref 80.0–100.0)
Monocytes Absolute: 0.7 10*3/uL (ref 0.1–1.0)
Monocytes Relative: 14 %
Neutro Abs: 3.3 10*3/uL (ref 1.7–7.7)
Neutrophils Relative %: 65 %
Platelet Count: 271 10*3/uL (ref 150–400)
RBC: 3.41 MIL/uL — ABNORMAL LOW (ref 3.87–5.11)
RDW: 17.3 % — ABNORMAL HIGH (ref 11.5–15.5)
WBC Count: 5 10*3/uL (ref 4.0–10.5)
nRBC: 0 % (ref 0.0–0.2)

## 2021-06-15 LAB — CMP (CANCER CENTER ONLY)
ALT: 14 U/L (ref 0–44)
AST: 21 U/L (ref 15–41)
Albumin: 3.5 g/dL (ref 3.5–5.0)
Alkaline Phosphatase: 52 U/L (ref 38–126)
Anion gap: 7 (ref 5–15)
BUN: 12 mg/dL (ref 8–23)
CO2: 30 mmol/L (ref 22–32)
Calcium: 9 mg/dL (ref 8.9–10.3)
Chloride: 99 mmol/L (ref 98–111)
Creatinine: 0.5 mg/dL (ref 0.44–1.00)
GFR, Estimated: >60 mL/min (ref >60)
Glucose, Bld: 122 mg/dL — ABNORMAL HIGH (ref 70–99)
Potassium: 3.6 mmol/L (ref 3.5–5.1)
Sodium: 136 mmol/L (ref 135–145)
Total Bilirubin: 0.5 mg/dL (ref 0.3–1.2)
Total Protein: 6.7 g/dL (ref 6.5–8.1)

## 2021-06-15 LAB — TSH: TSH: 3.763 u[IU]/mL (ref 0.308–3.960)

## 2021-06-15 MED ORDER — SODIUM CHLORIDE 0.9 % IV SOLN: Freq: Once | INTRAVENOUS | Status: AC

## 2021-06-15 MED ORDER — DRONABINOL 5 MG PO CAPS: 5.0000 mg | ORAL_CAPSULE | Freq: Two times a day (BID) | ORAL | 1 refills | Status: DC

## 2021-06-15 MED ORDER — MORPHINE SULFATE ER 30 MG PO TBCR: 30.0000 mg | EXTENDED_RELEASE_TABLET | Freq: Two times a day (BID) | ORAL | 0 refills | Status: DC

## 2021-06-15 MED ORDER — SODIUM CHLORIDE 0.9 % IV SOLN
500.0000 mg/m2 | Freq: Once | INTRAVENOUS | Status: AC
  Administered 2021-06-15: 12:00:00 700 mg via INTRAVENOUS
  Filled 2021-06-15: qty 20

## 2021-06-15 MED ORDER — HYDROXYZINE HCL 10 MG PO TABS: 10.0000 mg | ORAL_TABLET | Freq: Three times a day (TID) | ORAL | 0 refills | Status: DC | PRN

## 2021-06-15 NOTE — Progress Notes (Signed)
[place]  [phone] [phone]   Name: Kristi Jennings Date: 06/15/2021 MRN: [id number]  DOB: 28-Jul-1945  Patient Care Team: [name], MD as PCP - General (Internal Medicine) [place] as Registered Nurse [facility] and Palliative Medicine)    INTERVAL HISTORY: Kristi Jennings is a 76 y.o. 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 Palliative ask to see for symptom management.  SOCIAL HISTORY:     reports that she quit smoking about 12 years ago. Her smoking use included cigarettes. She has a 43.00 pack-year smoking history. She has never used smokeless tobacco. She reports that she does not currently use alcohol. She reports that she does not use drugs.  ADVANCE DIRECTIVES:  None on file   CODE STATUS:   PAST MEDICAL HISTORY: Past Medical History:  Diagnosis Date   Borderline diabetes mellitus    Bronchogenic cancer of right lung (HCC)    Dyslipidemia    Hypertension    Polymyalgia rheumatica (HCC)    ALLERGIES:  is allergic to shellfish-derived products and augmentin [amoxicillin-pot clavulanate].  MEDICATIONS:  Current Outpatient Medications  Medication Sig Dispense Refill   acetaminophen (TYLENOL) 650 MG CR tablet Take 1,300 mg by mouth 3 (three) times daily as needed for pain.     aspirin EC 81 MG tablet Take 81 mg by mouth 4 (four) times a week. Swallow whole.     atenolol (TENORMIN) 25 MG tablet Take 12.5 mg by mouth at bedtime.     Calcium-Vitamin D-Vitamin K (CALCIUM + D) [phone] MG-UNT-MCG CHEW      Cholecalciferol (VITAMIN D) 50 MCG (2000 UT) tablet      diclofenac Sodium (VOLTAREN) 1 % GEL Apply 4 g topically 4 (four) times daily. (Patient taking differently: Apply 4 g topically 4 (four) times daily as needed (pain).) 150 g 0   dronabinol (MARINOL) 2.5 MG capsule Take 1  capsule (2.5 mg total) by mouth 2 (two) times daily before a meal. 60 capsule 0   enoxaparin (LOVENOX) 60 MG/0.6ML injection Inject 0.6 mLs (60 mg total) into the skin daily. 18 mL 2   escitalopram (LEXAPRO) 20 MG tablet Take 20 mg by mouth at bedtime.     folic acid (FOLVITE) 1 MG tablet TAKE ONE TABLET BY MOUTH ONE TIME DAILY 30 tablet 2   HYDROmorphone (DILAUDID) 4 MG tablet Take 1 tablet (4 mg total) by mouth every 6 (six) hours as needed for severe pain or moderate pain. 120 tablet 0   hydrOXYzine (ATARAX) 10 MG tablet Take 1 tablet (10 mg total) by mouth 3 (three) times daily as needed. 30 tablet 0   losartan (COZAAR) 50 MG tablet Take 50 mg by mouth daily. (Patient not taking: Reported on 05/19/2021)     methylPREDNISolone (MEDROL DOSEPAK) 4 MG TBPK tablet Use as instructed (Patient not taking: Reported on 05/19/2021) 21 tablet 0   morphine (MS CONTIN) 30 MG 12 hr tablet Take 1 tablet (30 mg total) by mouth every 12 (twelve) hours. 60 tablet 0   Multiple Vitamin (MULTI VITAMIN) TABS      ondansetron (ZOFRAN-ODT) 4 MG disintegrating tablet Take 4 mg by mouth 2 (two) times daily as needed for nausea or vomiting. (Patient not taking: Reported on 03/16/2021)     prochlorperazine (COMPAZINE) 10 MG tablet Take 1 tablet (10 mg total) by mouth every 6 (six) hours  as needed for nausea or vomiting. (Patient not taking: Reported on 03/16/2021) 30 tablet 0   rosuvastatin (CRESTOR) 10 MG tablet Take 10 mg by mouth at bedtime. (Patient not taking: Reported on 05/19/2021)     No current facility-administered medications for this visit.    VITAL SIGNS: There were no vitals taken for this visit. There were no vitals filed for this visit.   Estimated body mass index is 14.88 kg/m as calculated from the following:   Height as of an earlier encounter on 06/15/21: 5\' 5"  (1.651 m).   Weight as of an earlier encounter on 06/15/21: 89 lb 6.4 oz (40.6 kg).  LABS: CBC:    Component Value Date/Time   WBC 5.0  06/15/2021 0921   WBC 2.7 (L) 01/05/2021 0543   HGB 10.2 (L) 06/15/2021 0921   HCT 32.4 (L) 06/15/2021 0921   PLT 271 06/15/2021 0921   MCV 95.0 06/15/2021 0921   NEUTROABS 3.3 06/15/2021 0921   LYMPHSABS 0.8 06/15/2021 0921   MONOABS 0.7 06/15/2021 0921   EOSABS 0.2 06/15/2021 0921   BASOSABS 0.0 06/15/2021 0921   Comprehensive Metabolic Panel:    Component Value Date/Time   NA 136 06/15/2021 0921   K 3.6 06/15/2021 0921   CL 99 06/15/2021 0921   CO2 30 06/15/2021 0921   BUN 12 06/15/2021 0921   CREATININE 0.50 06/15/2021 0921   GLUCOSE 122 (H) 06/15/2021 0921   CALCIUM 9.0 06/15/2021 0921   AST 21 06/15/2021 0921   ALT 14 06/15/2021 0921   ALKPHOS 52 06/15/2021 0921   BILITOT 0.5 06/15/2021 0921   PROT 6.7 06/15/2021 0921   ALBUMIN 3.5 06/15/2021 0921     PERFORMANCE STATUS (ECOG) : 1 - Symptomatic but completely ambulatory   Physical Exam General: NAD, ambulatory with no wheelchair need Cardiovascular: regular rate and rhythm Pulmonary: normal breathing pattern Abdomen: soft, nontender, + bowel sounds Extremities: no edema Skin: dry, scaly, itchy per patient  Neurological: AAO x4  IMPRESSION: [name] presents to the clinic today with her husband for follow-up. She also saw Dr. Earlie Server and is scheduled for her treatment today.  No acute distress noted.  Patient looks much better than previous visits.  She is ambulatory without assistive device.  Expressed appreciation of her improvement and feeling much better.  Husband states she is able to do more things around the home and is showing interest in any areas that she wants enjoyed.  Pain She continues to have some pain however this is not much improved.  She is not requiring Dilaudid for breakthrough pain.  States she is only having to take this prior to her radiation due to the discomfort of laying down but otherwise has been able to discontinue use.  I am excited patient is showing such improvement and has  reached a comfortable point with obvious increase in her quality of life.  We discussed continuing MS Contin with close evaluation and possibly weaning over time.  Constipation No concerns with constipation.  Continues to take MiraLAX and senna as needed for bowel regimen.  Anxiety Her anxiety has decreased significantly mainly due to to her improved wellbeing and decreased pain.  She states she is focusing on continued improvement and hopes things remain stable.  She is asking for signs that identify that she is doing "okay".  We discussed increase in activity level, increase in appetite, minimal pain, no limitation of lower extremity edema and redness.  Decreased appetite/Weight loss Mrs. Hellberg reports her appetite is  slowly improving.  Can feel a difference with the dronabinol.  Although she has noted some improvement she continues to be concerned expressing she has some weight loss at this visit.  We discussed continuing medication for appetite stimulation with increased dosing.  Reviewed importance of increased protein intake and focusing on frequent small meals versus larger meals.  Lower extremity leg swelling Bilateral lower extremity edema has resolved since last visit.  Both legs are equally been with no obvious swelling, erythema, or pain.  She is tolerating her Lovenox injections.  Is asking about transitioning back to oral anticoagulation.  Advised given her failed attempts with Xarelto and Coumadin she will require injectables for at least a year.  Overall patient is doing much better and symptom burden has drastically decreased.  We will continue to support and follow as needed.  PLAN: She has an upcoming appointment at [facility] to establish care with new PCP.  Continue MS Contin  every 12 hours (we will plan to wean over the next few weeks) Patient is not requiring breakthrough medication except in correlation with her radiation appointments.  Much  improved. Dronabinol 5 mg twice daily for appetite Ongoing symptom management (see above) I will plan to see patient back in 3-4 weeks in collaboration with her other appointments.    Patient expressed understanding and was in agreement with this plan. She also understands that She can call the clinic at any time with any questions, concerns, or complaints.   Time Total: 35 min  Visit consisted of counseling and education dealing with the complex and emotionally intense issues of symptom management and palliative care in the setting of serious and potentially life-threatening illness.Greater than 50%  of this time was spent counseling and coordinating care related to the above assessment and plan.  Alda Lea, AGPCNP-BC  Palliative Medicine Team/[place]

## 2021-06-15 NOTE — Patient Instructions (Signed)
[facility]   Discharge Instructions: Thank you for choosing [facility] to provide your oncology and hematology care.   If you have a lab appointment with the [facility], please go directly to the [facility] and check in at the registration area.   Wear comfortable clothing and clothing appropriate for easy access to any Portacath or PICC line.   We strive to give you quality time with your provider. You may need to reschedule your appointment if you arrive late (15 or more minutes).  Arriving late affects you and other patients whose appointments are after yours.  Also, if you miss three or more appointments without notifying the office, you may be dismissed from the clinic at the providers discretion.      For prescription refill requests, have your pharmacy contact our office and allow 72 hours for refills to be completed.    Today you received the following chemotherapy and/or immunotherapy agents: pemetrexed      To help prevent nausea and vomiting after your treatment, we encourage you to take your nausea medication as directed.  BELOW ARE SYMPTOMS THAT SHOULD BE REPORTED IMMEDIATELY: *FEVER GREATER THAN 100.4 F (38 C) OR HIGHER *CHILLS OR SWEATING *NAUSEA AND VOMITING THAT IS NOT CONTROLLED WITH YOUR NAUSEA MEDICATION *UNUSUAL SHORTNESS OF BREATH *UNUSUAL BRUISING OR BLEEDING *URINARY PROBLEMS (pain or burning when urinating, or frequent urination) *BOWEL PROBLEMS (unusual diarrhea, constipation, pain near the anus) TENDERNESS IN MOUTH AND THROAT WITH OR WITHOUT PRESENCE OF ULCERS (sore throat, sores in mouth, or a toothache) UNUSUAL RASH, SWELLING OR PAIN  UNUSUAL VAGINAL DISCHARGE OR ITCHING   Items with * indicate a potential emergency and should be followed up as soon as possible or go to the Emergency Department if any problems should occur.  Please show the CHEMOTHERAPY ALERT CARD or IMMUNOTHERAPY ALERT CARD at check-in  to the Emergency Department and triage nurse.  Should you have questions after your visit or need to cancel or reschedule your appointment, please contact [facility]  Dept: [phone]  and follow the prompts.  Office hours are 8:00 a.m. to 4:30 p.m. Monday - Friday. Please note that voicemails left after 4:00 p.m. may not be returned until the following business day.  We are closed weekends and major holidays. You have access to a nurse at all times for urgent questions. Please call the main number to the clinic Dept: [phone] and follow the prompts.   For any non-urgent questions, you may also contact your provider using MyChart. We now offer e-Visits for anyone 76 and older to request care online for non-urgent symptoms. For details visit [email or web address].   Also download the MyChart app! Go to the app store, search "MyChart", open the app, select [facility], and log in with your MyChart username and password.  Due to Covid, a mask is required upon entering the hospital/clinic. If you do not have a mask, one will be given to you upon arrival. For doctor visits, patients may have 1 support person aged 45 or older with them. For treatment visits, patients cannot have anyone with them due to current Covid guidelines and our immunocompromised population.  Enoxaparin Injection What is this medication? ENOXAPARIN (ee nox a PA rin) prevents or treats blood clots. It belongs to a group of medications called blood thinners. This medicine may be used for other purposes; ask your health care provider or pharmacist if you have questions. COMMON BRAND NAME(S):  Lovenox What should I tell my care team before I take this medication? They need to know if you have any of these conditions: Bleeding disorders, hemorrhage, or hemophilia Infection of the heart or heart valves Kidney or liver disease Previous stroke Prosthetic heart valve Recent surgery or delivery of a  baby Ulcer in the stomach or intestine, diverticulitis, or other bowel disease An unusual or allergic reaction to enoxaparin, heparin, pork or pork products, other medications, foods, dyes, or preservatives Pregnant or trying to get pregnant Breast-feeding How should I use this medication? This medication is injected under the skin. You will be taught how to prepare and give it. For your therapy to work as well as possible, take each dose exactly as prescribed on the prescription label. Do not skip doses. Skipping or stopping this medication can increase your risk of a blood clot. Keep taking this medication unless your care team tells you to stop. It is important that you put your used needles and syringes in a special sharps container. Do not put them in a trash can. If you do not have a sharps container, call your care team to get one. This medication comes with INSTRUCTIONS FOR USE. Ask your pharmacist for directions on how to use this medicine. Read the information carefully. Talk to your care team if you have questions. Talk to your care team about use of this medication in children. Special care may be needed. Overdosage: If you think you have taken too much of this medicine contact a poison control center or emergency room at once. NOTE: This medicine is only for you. Do not share this medicine with others. What if I miss a dose? If you miss a dose, take it as soon as you can. If it is almost time for your next dose, take only that dose. Do not take double or extra doses. What may interact with this medication? Aspirin and aspirin-like medications Certain medications that treat or prevent blood clots Dipyridamole NSAIDs, medications for pain and inflammation, like ibuprofen or naproxen This list may not describe all possible interactions. Give your health care provider a list of all the medicines, herbs, non-prescription drugs, or dietary supplements you use. Also tell them if you smoke,  drink alcohol, or use illegal drugs. Some items may interact with your medicine. What should I watch for while using this medication? Visit your care team for regular checks on your progress. You may need blood work done while you are taking this medication. Your condition will be monitored carefully while you are receiving this medication. It is important not to miss any appointments. If you are going to need surgery or other procedure, tell your care team that you are using this medication. Using this medication for a long time may weaken your bones and increase the risk of bone fractures. Avoid sports and activities that might cause injury while you are using this medication. Severe falls or injuries can cause unseen bleeding. Be careful when using sharp tools or knives. Consider using an Copy. Take special care brushing or flossing your teeth. Report any injuries, bruising, or red spots on the skin to your care team. Wear a medical ID bracelet or chain. Carry a card that describes your disease and details of your medication and dosage times. What side effects may I notice from receiving this medication? Side effects that you should report to your care team as soon as possible: Allergic reactions--skin rash, itching, hives, swelling of the face, lips, tongue,  or throat Bleeding--bloody or black, tar-like stools, vomiting blood or brown material that looks like coffee grounds, red or dark brown urine, small red or purple spots on skin, unusual bruising or bleeding Side effects that usually do not require medical attention (report to your care team if they continue or are bothersome): Pain, redness, or irritation at the injection site Swelling of the ankles, hands, or feet This list may not describe all possible side effects. Call your doctor for medical advice about side effects. You may report side effects to FDA at 1-800-FDA-1088. Where should I keep my medication? Keep out of the reach  of children. Store at room temperature between 15 and 30 degrees C (59 and 86 degrees F). Do not freeze. If your injections have been specially prepared, you may need to store them in the refrigerator. Ask your pharmacist. Throw away any unused medication after the expiration date. NOTE: This sheet is a summary. It may not cover all possible information. If you have questions about this medicine, talk to your doctor, pharmacist, or health care provider.  2022 Elsevier/Gold Standard (2020-07-16 00:00:00)

## 2021-06-15 NOTE — Progress Notes (Signed)
[place] [phone]   [phone]  OFFICE PROGRESS NOTE  [name], MD [address] [place] [address]  DIAGNOSIS: Stage IV (T3, N2, M1 C) non-small cell lung cancer with unknown histologic subtype.  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   DETECTED ALTERATION(S) / BIOMARKER(S)      % CFDNA OR AMPLIFICATION        ASSOCIATED FDA-APPROVED THERAPIES         CLINICAL TRIAL AVAILABILITY [id number] 8.9%   Sotorasib Yes [id number] 7.2% None     Yes     PRIOR THERAPY:  Palliative radiotherapy to the painful bone lesions under the care of Dr. Sondra Come.   CURRENT THERAPY:  1) Palliative systemic chemotherapy with carboplatin for AUC of 5, Alimta 500 Mg/M2 and Keytruda 200 Mg IV every 3 weeks.  First dose of treatment on December 29, 2020. Status post 8  cycles.  Starting from cycle #5, the patient is going to start maintenance Alimta and Keytruda. Keytruda discontinued from cycle #7 due to myalgias.  2)Zometa every 6 weeks starting on 02/10/21 3) Possible palliative radiation to the enlarging pulmonary mass under the care of Dr. Sondra Come.  INTERVAL HISTORY: Kristi Jennings 76 y.o. female returns to the clinic today for follow-up visit accompanied by her husband.  The patient is feeling fine today with no concerning complaints except for mild fatigue.  She is currently on single agent Alimta and tolerating it fairly well.  She denied having any current chest pain, shortness of breath, cough or hemoptysis.  She denied having any fever or chills.  She has no nausea, vomiting, diarrhea or constipation.  She has no headache or visual changes.  She is here today for evaluation before starting cycle #9 of her treatment.  MEDICAL HISTORY: Past Medical History:  Diagnosis Date    Borderline diabetes mellitus    Bronchogenic cancer of right lung (HCC)    Dyslipidemia    Hypertension    Polymyalgia rheumatica (HCC)     ALLERGIES:  is allergic to shellfish-derived products and augmentin [amoxicillin-pot clavulanate].  MEDICATIONS:  Current Outpatient Medications  Medication Sig Dispense Refill   acetaminophen (TYLENOL) 650 MG CR tablet Take 1,300 mg by mouth 3 (three) times daily as needed for pain.     aspirin EC 81 MG tablet Take 81 mg by mouth 4 (four) times a week. Swallow whole.     atenolol (TENORMIN) 25 MG tablet Take 12.5 mg by mouth at bedtime.     Calcium-Vitamin D-Vitamin K (CALCIUM + D) [phone] MG-UNT-MCG CHEW      Cholecalciferol (VITAMIN D) 50 MCG (2000 UT) tablet      diclofenac Sodium (VOLTAREN) 1 % GEL Apply 4 g topically 4 (four) times daily. (Patient taking differently: Apply 4 g topically 4 (four) times daily as needed (pain).) 150 g 0   dronabinol (MARINOL) 2.5 MG capsule Take 1 capsule (2.5 mg total) by mouth 2 (two) times daily before a meal. 60 capsule 0   enoxaparin (LOVENOX) 60 MG/0.6ML injection Inject 0.6 mLs (60 mg total) into the skin daily. 18 mL 2   escitalopram (LEXAPRO) 20 MG tablet Take 20 mg by mouth at bedtime.     folic acid (FOLVITE) 1 MG tablet TAKE ONE TABLET BY MOUTH ONE TIME  DAILY 30 tablet 2   HYDROmorphone (DILAUDID) 4 MG tablet Take 1 tablet (4 mg total) by mouth every 6 (six) hours as needed for severe pain or moderate pain. 120 tablet 0   hydrOXYzine (ATARAX) 10 MG tablet Take 1 tablet (10 mg total) by mouth 3 (three) times daily as needed. 30 tablet 0   losartan (COZAAR) 50 MG tablet Take 50 mg by mouth daily. (Patient not taking: Reported on 05/19/2021)     methylPREDNISolone (MEDROL DOSEPAK) 4 MG TBPK tablet Use as instructed (Patient not taking: Reported on 05/19/2021) 21 tablet 0   morphine (MS CONTIN) 30 MG 12 hr tablet Take 1 tablet (30 mg total) by mouth every 12 (twelve) hours. 60 tablet 0   Multiple Vitamin  (MULTI VITAMIN) TABS      ondansetron (ZOFRAN-ODT) 4 MG disintegrating tablet Take 4 mg by mouth 2 (two) times daily as needed for nausea or vomiting. (Patient not taking: Reported on 03/16/2021)     prochlorperazine (COMPAZINE) 10 MG tablet Take 1 tablet (10 mg total) by mouth every 6 (six) hours as needed for nausea or vomiting. (Patient not taking: Reported on 03/16/2021) 30 tablet 0   rosuvastatin (CRESTOR) 10 MG tablet Take 10 mg by mouth at bedtime. (Patient not taking: Reported on 05/19/2021)     No current facility-administered medications for this visit.    SURGICAL HISTORY: No past surgical history on file.  REVIEW OF SYSTEMS:  A comprehensive review of systems was negative except for: Constitutional: positive for fatigue   PHYSICAL EXAMINATION: General appearance: alert, cooperative, fatigued, and no distress Head: Normocephalic, without obvious abnormality, atraumatic Neck: no adenopathy, no JVD, supple, symmetrical, trachea midline, and thyroid not enlarged, symmetric, no tenderness/mass/nodules Lymph nodes: Cervical, supraclavicular, and axillary nodes normal. Resp: clear to auscultation bilaterally Back: symmetric, no curvature. ROM normal. No CVA tenderness. Cardio: regular rate and rhythm, S1, S2 normal, no murmur, click, rub or gallop GI: soft, non-tender; bowel sounds normal; no masses,  no organomegaly Extremities: extremities normal, atraumatic, no cyanosis or edema  ECOG PERFORMANCE STATUS: 1 - Symptomatic but completely ambulatory  Blood pressure 138/79, pulse 76, temperature 98.4 F (36.9 C), temperature source Tympanic, resp. rate 18, height $RemoveBe'5\' 5"'PDKKjiuph$  (1.651 m), weight 89 lb 6.4 oz (40.6 kg), SpO2 95 %.  LABORATORY DATA: Lab Results  Component Value Date   WBC 5.0 06/15/2021   HGB 10.2 (L) 06/15/2021   HCT 32.4 (L) 06/15/2021   MCV 95.0 06/15/2021   PLT 271 06/15/2021      Chemistry      Component Value Date/Time   NA 136 06/15/2021 0921   K 3.6  06/15/2021 0921   CL 99 06/15/2021 0921   CO2 30 06/15/2021 0921   BUN 12 06/15/2021 0921   CREATININE 0.50 06/15/2021 0921      Component Value Date/Time   CALCIUM 9.0 06/15/2021 0921   ALKPHOS 52 06/15/2021 0921   AST 21 06/15/2021 0921   ALT 14 06/15/2021 0921   BILITOT 0.5 06/15/2021 0921       RADIOGRAPHIC STUDIES: CT KNEE RIGHT W CONTRAST  Result Date: 05/31/2021 CLINICAL DATA:  Technologist note reports right leg mass. Patient reports she discovered something behind her right knee on DVT ultrasound. EXAM: CT OF THE RIGHT KNEE WITH CONTRAST TECHNIQUE: Multidetector CT imaging was performed following the standard protocol during bolus administration of intravenous contrast. CONTRAST:  82mL OMNIPAQUE IOHEXOL 350 MG/ML SOLN COMPARISON:  None. FINDINGS: Bones/Joint/Cartilage No acute fracture. No dislocation. Tricompartmental osteoarthritis of  the right knee, moderate within the medial and patellofemoral compartments with joint space narrowing and small marginal osteophytes. No knee joint effusion. Bony structures appear demineralized. Enthesopathy at the quadriceps tendon insertion on the superior patella. There is a 15 x 8 mm ossified loose body within the subpopliteal joint recess. Ligaments Suboptimally assessed by CT. Muscles and Tendons Musculotendinous structures appear intact by CT. Soft tissues Well-defined fluid collection at the posteromedial aspect of the knee extending into the proximal calf measures approximately 12.0 x 1.5 x 2.8 cm. Collection appears to arise from the posterior joint line of the knee between the medial head of the gastrocnemius and semimembranosus tendons, compatible with a Baker's cyst. Collection appears to extend partially intramuscular within the medial gastrocnemius. No evidence of a solid mass. Prominent atherosclerotic vascular calcifications. IMPRESSION: 1. Well-defined fluid collection at the posteromedial aspect of the knee extending into the proximal  calf measures approximately 12.0 x 1.5 x 2.8 cm, compatible with a Baker's cyst. 2. Tricompartmental osteoarthritis of the right knee, moderate within the medial and patellofemoral compartments. 3. Incidental note of a 1.5 cm ossified loose body within the subpopliteal joint recess. Electronically Signed   By: Davina Poke D.O.   On: 05/31/2021 09:52   VAS [place] LOWER EXTREMITY VENOUS (DVT)  Result Date: 05/19/2021  Lower Venous DVT Study Patient Name:  LUMI WINSLETT [facility]  Date of Exam:   05/19/2021 Medical Rec #: [id number]      Accession #:    [id number] Date of Birth: [birth year]      Patient Gender: F Patient Age:   26 years Exam Location:  [facility] Procedure:      VAS [place] LOWER EXTREMITY VENOUS (DVT) Referring Phys: Sherol Dade --------------------------------------------------------------------------------  Indications: Edema.  Limitations: Poor ultrasound/tissue interface. Comparison Study: No prior study Performing Technologist: Maudry Mayhew MHA, RDMS, RVT, RDCS  Examination Guidelines: A complete evaluation includes B-mode imaging, spectral Doppler, color Doppler, and power Doppler as needed of all accessible portions of each vessel. Bilateral testing is considered an integral part of a complete examination. Limited examinations for reoccurring indications may be performed as noted. The reflux portion of the exam is performed with the patient in reverse Trendelenburg.  +---------+---------------+---------+-----------+----------+--------------+  RIGHT     Compressibility Phasicity Spontaneity Properties Thrombus Aging  +---------+---------------+---------+-----------+----------+--------------+  CFV       Full            Yes       Yes                                    +---------+---------------+---------+-----------+----------+--------------+  SFJ       Full                                                              +---------+---------------+---------+-----------+----------+--------------+  FV Prox   Full                                                             +---------+---------------+---------+-----------+----------+--------------+  FV Mid  Full                                                             +---------+---------------+---------+-----------+----------+--------------+  FV Distal Full                                                             +---------+---------------+---------+-----------+----------+--------------+  PFV       Full                                                             +---------+---------------+---------+-----------+----------+--------------+  POP       Full            Yes       Yes                                    +---------+---------------+---------+-----------+----------+--------------+  PTV       Full                                                             +---------+---------------+---------+-----------+----------+--------------+  PERO      None                      No                     Acute           +---------+---------------+---------+-----------+----------+--------------+   +----+---------------+---------+-----------+----------+--------------+  LEFT Compressibility Phasicity Spontaneity Properties Thrombus Aging  +----+---------------+---------+-----------+----------+--------------+  CFV  Full            Yes       Yes                                    +----+---------------+---------+-----------+----------+--------------+     Summary: RIGHT: - Findings consistent with acute deep vein thrombosis involving the right peroneal veins. - A heterogenous structure is found in the popliteal fossa measuring at least 4.3 x 3.9cm with internal low-resistant vascularity. This is suggestive of neoplasm versus unknown etiology. Further imaging may be warranted if clinically indicated.  LEFT: - No evidence of common femoral vein obstruction.  *See table(s) above for measurements  and observations. Electronically signed by Harold Barban MD on 05/19/2021 at 8:01:58 PM.    Final     ASSESSMENT AND PLAN: This is a very pleasant 76 years old white female diagnosed with Stage IV (T3, N2, M1 C) non-small cell lung cancer with unknown histologic subtype, likely adenocarcinoma with positive KRAS G12C mutation.  This was diagnosed in June  2022 and presented with large cavitary right upper lobe lung mass in addition to mediastinal lymphadenopathy and metastatic disease to the bones as well as right adrenal gland.  Status post palliative radiotherapy to the painful bone lesions under the care of Dr. Sondra Come.  The patient started induction systemic chemotherapy with carboplatin for AUC of 5, Alimta 500 Mg/M2 and Keytruda 200 Mg IV every 3 weeks and currently on maintenance treatment with single agent Alimta after Keytruda was discontinued starting from cycle #7 because of significant arthralgia and myalgia. The patient tolerated the last cycle of her treatment fairly well. I recommended for her to proceed with cycle #9 today as planned. She is also undergoing palliative radiotherapy to enlarging pulmonary nodule under the care of Dr. Sondra Come. I will see her back for follow-up visit in 3 weeks for evaluation with repeat CT scan of the chest, abdomen and pelvis for restaging of her disease. The patient was advised to call immediately if she has any other concerning symptoms in the interval. The patient voices understanding of current disease status and treatment options and is in agreement with the current care plan.  All questions were answered. The patient knows to call the clinic with any problems, questions or concerns. We can certainly see the patient much sooner if necessary.  The total time spent in the appointment was 20 minutes.  Disclaimer: This note was dictated with voice recognition software. Similar sounding words can inadvertently be transcribed and may not be corrected upon  review.

## 2021-06-16 ENCOUNTER — Ambulatory Visit
Admission: RE | Admit: 2021-06-16 | Discharge: 2021-06-16 | Disposition: A | Discharge status: Home / Self Care | Payer: Medicare HMO | Source: Ambulatory Visit | Attending: Radiation Oncology | Admitting: Radiation Oncology

## 2021-06-16 ENCOUNTER — Ambulatory Visit: Payer: Medicare HMO

## 2021-06-16 DIAGNOSIS — Z510 Encounter for antineoplastic radiation therapy: Secondary | ICD-10-CM | POA: Diagnosis not present

## 2021-06-16 DIAGNOSIS — C7951 Secondary malignant neoplasm of bone: Secondary | ICD-10-CM | POA: Diagnosis not present

## 2021-06-16 DIAGNOSIS — Z87891 Personal history of nicotine dependence: Secondary | ICD-10-CM | POA: Diagnosis not present

## 2021-06-16 DIAGNOSIS — C3411 Malignant neoplasm of upper lobe, right bronchus or lung: Secondary | ICD-10-CM | POA: Diagnosis not present

## 2021-06-16 DIAGNOSIS — C7971 Secondary malignant neoplasm of right adrenal gland: Secondary | ICD-10-CM | POA: Diagnosis not present

## 2021-06-17 ENCOUNTER — Encounter: Payer: Self-pay | Admitting: Radiation Oncology

## 2021-06-17 ENCOUNTER — Ambulatory Visit
Admission: RE | Admit: 2021-06-17 | Discharge: 2021-06-17 | Disposition: A | Discharge status: Home / Self Care | Payer: Medicare HMO | Source: Ambulatory Visit | Attending: Radiation Oncology | Admitting: Radiation Oncology

## 2021-06-17 ENCOUNTER — Ambulatory Visit: Payer: Medicare HMO

## 2021-06-17 DIAGNOSIS — C7951 Secondary malignant neoplasm of bone: Secondary | ICD-10-CM | POA: Diagnosis not present

## 2021-06-17 DIAGNOSIS — C7971 Secondary malignant neoplasm of right adrenal gland: Secondary | ICD-10-CM | POA: Diagnosis not present

## 2021-06-17 DIAGNOSIS — Z510 Encounter for antineoplastic radiation therapy: Secondary | ICD-10-CM | POA: Diagnosis not present

## 2021-06-17 DIAGNOSIS — Z87891 Personal history of nicotine dependence: Secondary | ICD-10-CM | POA: Diagnosis not present

## 2021-06-17 DIAGNOSIS — C3411 Malignant neoplasm of upper lobe, right bronchus or lung: Secondary | ICD-10-CM | POA: Diagnosis not present

## 2021-06-20 ENCOUNTER — Ambulatory Visit: Payer: Medicare HMO | Admitting: Family Medicine

## 2021-06-20 ENCOUNTER — Telehealth: Payer: Self-pay

## 2021-06-20 ENCOUNTER — Ambulatory Visit: Payer: Medicare HMO

## 2021-06-20 NOTE — Telephone Encounter (Signed)
Notified Patient by voicemail of prior authorization approval for Dronabinol 5mg  Capsules. Medication is approved through 12/17/2021. Pharmacy notified of approval.

## 2021-06-21 DIAGNOSIS — C3490 Malignant neoplasm of unspecified part of unspecified bronchus or lung: Secondary | ICD-10-CM | POA: Diagnosis not present

## 2021-06-23 DIAGNOSIS — Z681 Body mass index (BMI) 19 or less, adult: Secondary | ICD-10-CM | POA: Diagnosis not present

## 2021-06-23 DIAGNOSIS — M2550 Pain in unspecified joint: Secondary | ICD-10-CM | POA: Diagnosis not present

## 2021-06-23 DIAGNOSIS — M353 Polymyalgia rheumatica: Secondary | ICD-10-CM | POA: Diagnosis not present

## 2021-06-23 DIAGNOSIS — M154 Erosive (osteo)arthritis: Secondary | ICD-10-CM | POA: Diagnosis not present

## 2021-06-23 DIAGNOSIS — C3490 Malignant neoplasm of unspecified part of unspecified bronchus or lung: Secondary | ICD-10-CM | POA: Diagnosis not present

## 2021-06-23 DIAGNOSIS — M0579 Rheumatoid arthritis with rheumatoid factor of multiple sites without organ or systems involvement: Secondary | ICD-10-CM | POA: Diagnosis not present

## 2021-06-29 ENCOUNTER — Other Ambulatory Visit: Payer: Self-pay | Admitting: Nurse Practitioner

## 2021-06-29 MED ORDER — HYDROXYZINE HCL 10 MG PO TABS: 10.0000 mg | ORAL_TABLET | Freq: Three times a day (TID) | ORAL | 0 refills | Status: DC | PRN

## 2021-06-30 ENCOUNTER — Telehealth: Payer: Self-pay

## 2021-06-30 NOTE — Telephone Encounter (Signed)
Pt's husband called to confirm that Atarax prescription with 90 count had been sent to [facility] on [address]  [employer]. Confirmed. All questions answered.

## 2021-07-04 ENCOUNTER — Ambulatory Visit (HOSPITAL_COMMUNITY)
Admission: RE | Admit: 2021-07-04 | Discharge: 2021-07-04 | Disposition: A | Discharge status: Home / Self Care | Payer: Medicare HMO | Source: Ambulatory Visit | Attending: Internal Medicine | Admitting: Internal Medicine

## 2021-07-04 ENCOUNTER — Other Ambulatory Visit: Payer: Self-pay

## 2021-07-04 ENCOUNTER — Encounter (HOSPITAL_COMMUNITY): Payer: Self-pay

## 2021-07-04 DIAGNOSIS — C3490 Malignant neoplasm of unspecified part of unspecified bronchus or lung: Secondary | ICD-10-CM | POA: Insufficient documentation

## 2021-07-04 DIAGNOSIS — R918 Other nonspecific abnormal finding of lung field: Secondary | ICD-10-CM | POA: Diagnosis not present

## 2021-07-04 DIAGNOSIS — K7689 Other specified diseases of liver: Secondary | ICD-10-CM | POA: Diagnosis not present

## 2021-07-04 DIAGNOSIS — I700 Atherosclerosis of aorta: Secondary | ICD-10-CM | POA: Diagnosis not present

## 2021-07-04 MED ORDER — IOHEXOL 300 MG/ML  SOLN
80.0000 mL | Freq: Once | INTRAMUSCULAR | Status: AC | PRN
  Administered 2021-07-04: 80 mL via INTRAVENOUS

## 2021-07-04 MED ORDER — SODIUM CHLORIDE (PF) 0.9 % IJ SOLN
INTRAMUSCULAR | Status: AC
  Filled 2021-07-04: qty 50

## 2021-07-04 NOTE — Progress Notes (Signed)
[facility] OFFICE PROGRESS NOTE  [name], MD [address] [place] [address]  DIAGNOSIS:  Stage IV (T3, N2, M1 C) non-small cell lung cancer with unknown histologic subtype.  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   DETECTED ALTERATION(S) / BIOMARKER(S)      % CFDNA OR AMPLIFICATION        ASSOCIATED FDA-APPROVED THERAPIES         CLINICAL TRIAL AVAILABILITY [id number] 8.9%   Sotorasib Yes [id number] 7.2% None     Yes    PRIOR THERAPY:   1) Palliative radiotherapy to the painful bone lesions under the care of Dr. Sondra Come. 2) palliative radiation to the enlarging pulmonary mass under the care of Dr. Sondra Come. Last dose on 06/17/21  CURRENT THERAPY: 1) Palliative systemic chemotherapy with carboplatin for AUC of 5, Alimta 500 Mg/M2 and Keytruda 200 Mg IV every 3 weeks.  First dose of treatment on December 29, 2020. Status post 9 cycles.  Starting from cycle #5, the patient is going to start maintenance Alimta and Keytruda. Keytruda discontinued from cycle #7 due to myalgias.  2)Zometa every 6 weeks starting on 02/10/21     INTERVAL HISTORY: Kristi Jennings 76 y.o. female returns to the clinic today for a follow-up visit accompanied by her husband.  The patient started developing abrupt onset of diffuse myalgias at the end of November 2022.  She followed up with her rheumatologist and is currently on prednisone.    Since starting prednisone, the patient's pain has improved.  She is following closely palliative care.  She is currently taking MS Contin 30 milligrams twice daily.  She has not needed to take her breakthrough Dilaudid recently which is a great improvement from a few weeks ago.  She takes MiraLAX and senna for constipation or bowel regimen.  She was found to have DVT in the lower  extremity despite being on an anticoagulant.  Therefore, she is currently taking Lovenox and has been tolerating this well, although she does not like taking injections. Unfortunately, she failed oral anti-coagulants.    She denies any fever or chills. Sometimes she notes her hair is damp at night.  She is followed by member the nutritionist team and she is presently on Marinol.  Her weight is stable today. Her appetite comes and goes. She sometimes has good days with her appetite and other days she has a poor appetite.   The patient recently completed palliative radiation under the care of Dr. Sondra Come to the enlarging lung mass.  She reports her breathing has "been fine". She denies any significant dyspnea on exertion. Denies any chest pain, cough, or hemoptysis.  Denies any nausea, vomiting, diarrhea, or constipation.  Denies any headache or visual changes. She has a rash on her right flank. Her husband has been applying triamcinolone cream which has been helping.  She is established with a dermatologist. She recently had a restaging CT scan performed.  She is here today for evaluation and to review her scan results before starting cycle #10.     MEDICAL HISTORY: Past Medical History:  Diagnosis Date   Borderline diabetes mellitus    Bronchogenic cancer of right lung (HCC)    Dyslipidemia    Hypertension    Polymyalgia rheumatica (HCC)     ALLERGIES:  is allergic  to shellfish-derived products and augmentin [amoxicillin-pot clavulanate].  MEDICATIONS:  Current Outpatient Medications  Medication Sig Dispense Refill   acetaminophen (TYLENOL) 650 MG CR tablet Take 1,300 mg by mouth 3 (three) times daily as needed for pain.     aspirin EC 81 MG tablet Take 81 mg by mouth 4 (four) times a week. Swallow whole.     atenolol (TENORMIN) 25 MG tablet Take 12.5 mg by mouth at bedtime.     Calcium-Vitamin D-Vitamin K (CALCIUM + D) [id number] MG-UNT-MCG CHEW      Cholecalciferol (VITAMIN D) 50 MCG  (2000 UT) tablet      diclofenac Sodium (VOLTAREN) 1 % GEL Apply 4 g topically 4 (four) times daily. (Patient taking differently: Apply 4 g topically 4 (four) times daily as needed (pain).) 150 g 0   dronabinol (MARINOL) 5 MG capsule Take 1 capsule (5 mg total) by mouth 2 (two) times daily before a meal. 30 capsule 1   enoxaparin (LOVENOX) 60 MG/0.6ML injection Inject 0.6 mLs (60 mg total) into the skin daily. 18 mL 2   escitalopram (LEXAPRO) 20 MG tablet Take 20 mg by mouth at bedtime.     folic acid (FOLVITE) 1 MG tablet Take 1 tablet (1 mg total) by mouth daily. 30 tablet 2   HYDROmorphone (DILAUDID) 4 MG tablet Take 1 tablet (4 mg total) by mouth every 6 (six) hours as needed for severe pain or moderate pain. 120 tablet 0   hydrOXYzine (ATARAX) 10 MG tablet Take 1 tablet (10 mg total) by mouth 3 (three) times daily as needed. 90 tablet 0   losartan (COZAAR) 50 MG tablet Take 50 mg by mouth daily. (Patient not taking: Reported on 05/19/2021)     methylPREDNISolone (MEDROL DOSEPAK) 4 MG TBPK tablet Use as instructed (Patient not taking: Reported on 05/19/2021) 21 tablet 0   morphine (MS CONTIN) 30 MG 12 hr tablet Take 1 tablet (30 mg total) by mouth every 12 (twelve) hours. 60 tablet 0   Multiple Vitamin (MULTI VITAMIN) TABS      ondansetron (ZOFRAN-ODT) 4 MG disintegrating tablet Take 4 mg by mouth 2 (two) times daily as needed for nausea or vomiting. (Patient not taking: Reported on 03/16/2021)     prochlorperazine (COMPAZINE) 10 MG tablet Take 1 tablet (10 mg total) by mouth every 6 (six) hours as needed for nausea or vomiting. (Patient not taking: Reported on 03/16/2021) 30 tablet 0   rosuvastatin (CRESTOR) 10 MG tablet Take 10 mg by mouth at bedtime. (Patient not taking: Reported on 05/19/2021)     No current facility-administered medications for this visit.    SURGICAL HISTORY: No past surgical history on file.  REVIEW OF SYSTEMS:   Review of Systems  Constitutional: Positive for up  and down appetite and fatigue following treatment. Negative for chills, fever and unexpected weight change.  HENT: Negative for mouth sores, nosebleeds, sore throat and trouble swallowing.   Eyes: Negative for eye problems and icterus.  Respiratory: Negative for cough, shortness of breath, hemoptysis and wheezing.   Cardiovascular: Negative for chest pain and leg swelling.  Gastrointestinal:  Negative for abdominal pain,constipation, diarrhea, nausea and vomiting.  Genitourinary: Negative for bladder incontinence, difficulty urinating, dysuria, frequency and hematuria.   Musculoskeletal:  Negative for gait problem, neck pain and neck stiffness.  Skin: Positive for rash on right flank  Neurological: Negative for dizziness, extremity weakness, gait problem, headaches, light-headedness and seizures.  Hematological: Negative for adenopathy. Does not bruise/bleed easily.  Psychiatric/Behavioral: Negative for  confusion, depression and sleep disturbance. The patient is not nervous/anxious     PHYSICAL EXAMINATION:  Blood pressure (!) 144/85, pulse 79, temperature 98.4 F (36.9 C), resp. rate 16, weight 88 lb 3.2 oz (40 kg).  ECOG PERFORMANCE STATUS: 1-2  Physical Exam  Constitutional: Oriented to person, place, and time and cachetic appearing female, and in no distress.  HENT:  Head: Normocephalic and atraumatic.  Mouth/Throat: Oropharynx is clear and moist. No oropharyngeal exudate.  Eyes: Conjunctivae are normal. Right eye exhibits no discharge. Left eye exhibits no discharge. No scleral icterus.  Neck: Normal range of motion. Neck supple.  Cardiovascular: Normal rate, regular rhythm, normal heart sounds and intact distal pulses.   Pulmonary/Chest: Effort normal and breath sounds normal. No respiratory distress. No wheezes. No rales.  Abdominal: Soft. Bowel sounds are normal. Exhibits no distension and no mass. There is no tenderness.  Musculoskeletal: Normal range of motion and no edema.   Lymphadenopathy:    No cervical adenopathy.  Neurological: Alert and oriented to person, place, and time. Exhibits muscle wasting. Coordination normal.  Skin: Skin is warm and dry. No rash noted. Not diaphoretic. No erythema. No pallor.  Psychiatric: Mood, memory and judgment normal.  Vitals reviewed.  LABORATORY DATA: Lab Results  Component Value Date   WBC 4.8 07/06/2021   HGB 11.7 (L) 07/06/2021   HCT 35.5 (L) 07/06/2021   MCV 94.7 07/06/2021   PLT 225 07/06/2021      Chemistry      Component Value Date/Time   NA 137 07/06/2021 0943   K 3.7 07/06/2021 0943   CL 102 07/06/2021 0943   CO2 29 07/06/2021 0943   BUN 10 07/06/2021 0943   CREATININE 0.52 07/06/2021 0943      Component Value Date/Time   CALCIUM 8.6 (L) 07/06/2021 0943   ALKPHOS 52 07/06/2021 0943   AST 31 07/06/2021 0943   ALT 23 07/06/2021 0943   BILITOT 0.2 (L) 07/06/2021 0943       RADIOGRAPHIC STUDIES:  CT Chest W Contrast  Result Date: 07/04/2021 CLINICAL DATA:  76 year old female with history of non-small cell lung cancer. Chemotherapy and radiation therapy ongoing. Staging examination. EXAM: CT CHEST, ABDOMEN, AND PELVIS WITH CONTRAST TECHNIQUE: Multidetector CT imaging of the chest, abdomen and pelvis was performed following the standard protocol during bolus administration of intravenous contrast. RADIATION DOSE REDUCTION: This exam was performed according to the departmental dose-optimization program which includes automated exposure control, adjustment of the mA and/or kV according to patient size and/or use of iterative reconstruction technique. CONTRAST:  34mL OMNIPAQUE IOHEXOL 300 MG/ML  SOLN COMPARISON:  CT the chest, abdomen and pelvis 04/29/2021. FINDINGS: CT CHEST FINDINGS Cardiovascular: Heart size is normal. There is no significant pericardial fluid, thickening or pericardial calcification. There is aortic atherosclerosis, as well as atherosclerosis of the great vessels of the mediastinum and  the coronary arteries, including calcified atherosclerotic plaque in the left main, left anterior descending, left circumflex and right coronary arteries. Calcifications of the aortic valve and mitral annulus. Ectasia of the ascending thoracic aorta (4.2 cm in diameter). Mediastinum/Nodes: No pathologically enlarged mediastinal or hilar lymph nodes. Hilar esophagus is unremarkable in appearance. No axillary lymphadenopathy. Lungs/Pleura: Cavitary mass in the right upper lobe is similar to the prior study measuring 3.7 x 6.5 cm on today's examination (axial image 37 of series 6). Patchy areas of ground-glass attenuation, septal thickening and regional architectural distortion are noted in the periphery of the lateral segment of the right middle lobe, likely  to reflect evolving postradiation changes. 1.3 x 0.9 cm ground-glass attenuation nodule in the lateral aspect of the left upper lobe (axial image 93 of series 6), stable in retrospect compared to the prior study, and nonspecific. No pleural effusions. Musculoskeletal: Chronic compression fractures of T4, T8 and T12 are again noted, most severe at T12 where there is 80% loss of anterior vertebral body height. Large lucent areas in T8 and T12 are again noted, suggesting that these are likely pathologic compression fractures. Multiple other mixed lytic and sclerotic lesions are noted elsewhere in the visualized axial and appendicular skeleton, indicative of widespread metastatic disease to the bones, most notable for a destructive lesion involving the anterolateral aspect of the right third rib. CT ABDOMEN PELVIS FINDINGS Hepatobiliary: Treated lesion in segment 7 of the liver currently measures 1.6 x 1.5 cm (axial image 55 of series 2), stable. No other new suspicious appearing hepatic lesions are noted. No intra or extrahepatic biliary ductal dilatation. Gallbladder is normal in appearance. Pancreas: No pancreatic mass. No pancreatic ductal dilatation. No  pancreatic or peripancreatic fluid collections or inflammatory changes. Spleen: Unremarkable. Adrenals/Urinary Tract: 2.7 x 2.2 cm right adrenal nodule, similar to the prior examination. Bilateral kidneys and left adrenal gland are normal in appearance. No hydroureteronephrosis. Urinary bladder is normal in appearance. Stomach/Bowel: The appearance of the stomach is normal. There is no pathologic dilatation of small bowel or colon. The appendix is not confidently identified and may be surgically absent. Regardless, there are no inflammatory changes noted adjacent to the cecum to suggest the presence of an acute appendicitis at this time. Vascular/Lymphatic: Aortic atherosclerosis, without evidence of aneurysm or dissection in the abdominal or pelvic vasculature. No lymphadenopathy noted in the abdomen or pelvis. Reproductive: Uterus and ovaries are unremarkable in appearance. Other: No significant volume of ascites.  No pneumoperitoneum. Musculoskeletal: Multiple lucent areas are again noted in the visualized portions of the proximal femurs bilaterally, suggesting widespread metastatic disease to the bones. A large lytic lesion is also noted in the left ilium just lateral to the left sacroiliac joint. IMPRESSION: 1. Overall, today's examination demonstrates little change compared to the prior study, with a large partially cavitary right upper lobe mass, metastatic lesions in the right lobe of the liver and right adrenal gland which are essentially stable compared to the prior examination, and multiple osseous metastasis also very similar to the prior study, as detailed above. No new signs of metastatic disease noted elsewhere in the chest, abdomen or pelvis. 2. Aortic atherosclerosis, in addition to left main and three-vessel coronary artery disease. Assessment for potential risk factor modification, dietary therapy or pharmacologic therapy may be warranted, if clinically indicated. 3. Ectasia of ascending thoracic  aorta (4.2 cm in diameter). Recommend annual imaging followup by CTA or MRA. This recommendation follows 2010 ACCF/AHA/AATS/ACR/ASA/SCA/SCAI/SIR/STS/SVM Guidelines for the Diagnosis and Management of Patients with Thoracic Aortic Disease. Circulation. 2010; 121: C127-N170. Aortic aneurysm NOS (ICD10-I71.9). 4. There are calcifications of the aortic valve and mitral annulus. Echocardiographic correlation for evaluation of potential valvular dysfunction may be warranted if clinically indicated. 5. Additional incidental findings, as above. Electronically Signed   By: Vinnie Langton M.D.   On: 07/04/2021 15:09   CT Abdomen Pelvis W Contrast  Result Date: 07/04/2021 CLINICAL DATA:  76 year old female with history of non-small cell lung cancer. Chemotherapy and radiation therapy ongoing. Staging examination. EXAM: CT CHEST, ABDOMEN, AND PELVIS WITH CONTRAST TECHNIQUE: Multidetector CT imaging of the chest, abdomen and pelvis was performed following the standard protocol  during bolus administration of intravenous contrast. RADIATION DOSE REDUCTION: This exam was performed according to the departmental dose-optimization program which includes automated exposure control, adjustment of the mA and/or kV according to patient size and/or use of iterative reconstruction technique. CONTRAST:  98mL OMNIPAQUE IOHEXOL 300 MG/ML  SOLN COMPARISON:  CT the chest, abdomen and pelvis 04/29/2021. FINDINGS: CT CHEST FINDINGS Cardiovascular: Heart size is normal. There is no significant pericardial fluid, thickening or pericardial calcification. There is aortic atherosclerosis, as well as atherosclerosis of the great vessels of the mediastinum and the coronary arteries, including calcified atherosclerotic plaque in the left main, left anterior descending, left circumflex and right coronary arteries. Calcifications of the aortic valve and mitral annulus. Ectasia of the ascending thoracic aorta (4.2 cm in diameter). Mediastinum/Nodes: No  pathologically enlarged mediastinal or hilar lymph nodes. Hilar esophagus is unremarkable in appearance. No axillary lymphadenopathy. Lungs/Pleura: Cavitary mass in the right upper lobe is similar to the prior study measuring 3.7 x 6.5 cm on today's examination (axial image 37 of series 6). Patchy areas of ground-glass attenuation, septal thickening and regional architectural distortion are noted in the periphery of the lateral segment of the right middle lobe, likely to reflect evolving postradiation changes. 1.3 x 0.9 cm ground-glass attenuation nodule in the lateral aspect of the left upper lobe (axial image 93 of series 6), stable in retrospect compared to the prior study, and nonspecific. No pleural effusions. Musculoskeletal: Chronic compression fractures of T4, T8 and T12 are again noted, most severe at T12 where there is 80% loss of anterior vertebral body height. Large lucent areas in T8 and T12 are again noted, suggesting that these are likely pathologic compression fractures. Multiple other mixed lytic and sclerotic lesions are noted elsewhere in the visualized axial and appendicular skeleton, indicative of widespread metastatic disease to the bones, most notable for a destructive lesion involving the anterolateral aspect of the right third rib. CT ABDOMEN PELVIS FINDINGS Hepatobiliary: Treated lesion in segment 7 of the liver currently measures 1.6 x 1.5 cm (axial image 55 of series 2), stable. No other new suspicious appearing hepatic lesions are noted. No intra or extrahepatic biliary ductal dilatation. Gallbladder is normal in appearance. Pancreas: No pancreatic mass. No pancreatic ductal dilatation. No pancreatic or peripancreatic fluid collections or inflammatory changes. Spleen: Unremarkable. Adrenals/Urinary Tract: 2.7 x 2.2 cm right adrenal nodule, similar to the prior examination. Bilateral kidneys and left adrenal gland are normal in appearance. No hydroureteronephrosis. Urinary bladder is  normal in appearance. Stomach/Bowel: The appearance of the stomach is normal. There is no pathologic dilatation of small bowel or colon. The appendix is not confidently identified and may be surgically absent. Regardless, there are no inflammatory changes noted adjacent to the cecum to suggest the presence of an acute appendicitis at this time. Vascular/Lymphatic: Aortic atherosclerosis, without evidence of aneurysm or dissection in the abdominal or pelvic vasculature. No lymphadenopathy noted in the abdomen or pelvis. Reproductive: Uterus and ovaries are unremarkable in appearance. Other: No significant volume of ascites.  No pneumoperitoneum. Musculoskeletal: Multiple lucent areas are again noted in the visualized portions of the proximal femurs bilaterally, suggesting widespread metastatic disease to the bones. A large lytic lesion is also noted in the left ilium just lateral to the left sacroiliac joint. IMPRESSION: 1. Overall, today's examination demonstrates little change compared to the prior study, with a large partially cavitary right upper lobe mass, metastatic lesions in the right lobe of the liver and right adrenal gland which are essentially stable compared to  the prior examination, and multiple osseous metastasis also very similar to the prior study, as detailed above. No new signs of metastatic disease noted elsewhere in the chest, abdomen or pelvis. 2. Aortic atherosclerosis, in addition to left main and three-vessel coronary artery disease. Assessment for potential risk factor modification, dietary therapy or pharmacologic therapy may be warranted, if clinically indicated. 3. Ectasia of ascending thoracic aorta (4.2 cm in diameter). Recommend annual imaging followup by CTA or MRA. This recommendation follows 2010 ACCF/AHA/AATS/ACR/ASA/SCA/SCAI/SIR/STS/SVM Guidelines for the Diagnosis and Management of Patients with Thoracic Aortic Disease. Circulation. 2010; 121: E751-Z001. Aortic aneurysm NOS  (ICD10-I71.9). 4. There are calcifications of the aortic valve and mitral annulus. Echocardiographic correlation for evaluation of potential valvular dysfunction may be warranted if clinically indicated. 5. Additional incidental findings, as above. Electronically Signed   By: Vinnie Langton M.D.   On: 07/04/2021 15:09     ASSESSMENT/PLAN:  This is a very pleasant 76 year old Caucasian female diagnosed with stage IV (T3, N2, M1 C) non-small cell lung cancer with unknown histologic subtype questionably likely to be squamous cell carcinoma based on the morphology of the right upper lobe cavitary mass with mediastinal lymphadenopathy metastatic disease to the bones as well as the right adrenal gland.  She was diagnosed in June 2022.  Adenocarcinoma cannot be completely ruled out especially with the K-ras G12C mutation.  Since the patient has an actionable mutation with K-ras G12C, this can be used as an option in the second line setting.   The patient completed palliative radiotherapy to multiple metastatic bone lesions under the care of Dr. Sondra Come.   The patient is currently undergoing systemic chemotherapy with carboplatin AUC 5, Alimta 500 mg per metered squared, Keytruda 200 mg IV every 3 weeks.  She is status post 9 cycles.  Starting from cycle #5, the patient will be on maintenance Alimta 500 mg per metered squared and Keytruda 200 mg. We have discontinued Keytruda starting from cycle #7 due to her significant myalgias in the event this is immunotherapy related.   She is presently on prednisone for polymyalgia rheumatica.  The patient completed additional radiation under the care of Dr. Sondra Come in January 2022 to the enlarging primary lung mass.   The patient recently had a restaging CT scan performed.  Dr. Julien Nordmann personally and independently reviewed the scan and discussed the results with the patient today.  The scan showed no evidence of disease progression. Dr. Julien Nordmann recommend that she proceed  with cycle #10 today scheduled.  We will see her back for follow-up visit in 3 weeks for evaluation before starting cycle #11.  She will continue taking Lovenox for history of DVT having failed oral anticoagulation.  She will continue to take MS contin BID for pain.   Zometa every 6 weeks for bone disease  Continue taking Marinol for decreased appetite.  For her rash, advised she may use triamcinolone cream. If she needs a refill, advised to call us and I would be happy to prescribe it. If no improvement, then I recommended that they follow up with their dermatologist.    The patient was advised to call immediately if she has any concerning symptoms in the interval. The patient voices understanding of current disease status and treatment options and is in agreement with the current care plan. All questions were answered. The patient knows to call the clinic with any problems, questions or concerns. We can certainly see the patient much sooner if necessary  No orders of the defined types  were placed in this encounter.    Kerron Sedano L Braylyn Kalter, PA-C 07/06/21  ADDENDUM: Hematology/Oncology Attending: I had a face-to-face encounter with the patient today.  I reviewed her record, lab, scan and recommended her care plan.  This is a very pleasant 76 years old white female diagnosed with stage IV (T3, N2, M1 C) non-small cell lung cancer with unknown histologic type but questionable to be adenocarcinoma based on the finding of positive KRAS G12C mutation.  The patient underwent palliative radiotherapy to multiple metastatic bone lesions under the care of Dr. Sondra Come. She started systemic chemotherapy with induction carboplatin, Alimta and Keytruda for 4 cycles followed by maintenance Alimta and Keytruda for 2 more cycles but Keytruda was discontinued starting cycle #7 secondary to significant myalgia related to the immunotherapy.  She is also on treatment with prednisone for polymyalgia  rheumatica. The patient has been tolerating her treatment with single agent Alimta fairly well. She had repeat CT scan of the chest, abdomen pelvis performed recently.  I personally and independently reviewed the scan and discussed the results with the patient and her husband. Her scan showed no concerning findings for disease progression. I recommended for her to continue her current treatment with maintenance Alimta and she will proceed with cycle #10 today as planned. For the bone disease she will continue her treatment with Zometa every 6 weeks. For the history of deep venous thrombosis and pulmonary embolism, she will continue her current treatment with Lovenox. The patient will come back for follow-up visit in 3 weeks for evaluation before the next cycle of her treatment. She was advised to call immediately if she has any other concerning symptoms in the interval. The total time spent in the appointment was 35 minutes. Disclaimer: This note was dictated with voice recognition software. Similar sounding words can inadvertently be transcribed and may be missed upon review. Eilleen Kempf, MD 07/06/21

## 2021-07-06 ENCOUNTER — Inpatient Hospital Stay: Payer: Medicare HMO

## 2021-07-06 ENCOUNTER — Encounter: Payer: Self-pay | Admitting: Nurse Practitioner

## 2021-07-06 ENCOUNTER — Other Ambulatory Visit: Payer: Self-pay

## 2021-07-06 ENCOUNTER — Inpatient Hospital Stay: Payer: Medicare HMO | Attending: Internal Medicine

## 2021-07-06 ENCOUNTER — Inpatient Hospital Stay (HOSPITAL_BASED_OUTPATIENT_CLINIC_OR_DEPARTMENT_OTHER): Payer: Medicare HMO | Admitting: Nurse Practitioner

## 2021-07-06 ENCOUNTER — Inpatient Hospital Stay: Payer: Medicare HMO | Admitting: Physician Assistant

## 2021-07-06 ENCOUNTER — Inpatient Hospital Stay: Payer: Medicare HMO | Admitting: Dietician

## 2021-07-06 VITALS — BP 144/85 | HR 79 | Temp 98.4°F | Resp 16 | Wt 88.2 lb

## 2021-07-06 DIAGNOSIS — Z7901 Long term (current) use of anticoagulants: Secondary | ICD-10-CM | POA: Diagnosis not present

## 2021-07-06 DIAGNOSIS — G893 Neoplasm related pain (acute) (chronic): Secondary | ICD-10-CM

## 2021-07-06 DIAGNOSIS — Z5111 Encounter for antineoplastic chemotherapy: Secondary | ICD-10-CM

## 2021-07-06 DIAGNOSIS — Z86718 Personal history of other venous thrombosis and embolism: Secondary | ICD-10-CM | POA: Insufficient documentation

## 2021-07-06 DIAGNOSIS — Z79899 Other long term (current) drug therapy: Secondary | ICD-10-CM | POA: Diagnosis not present

## 2021-07-06 DIAGNOSIS — F419 Anxiety disorder, unspecified: Secondary | ICD-10-CM

## 2021-07-06 DIAGNOSIS — Z86711 Personal history of pulmonary embolism: Secondary | ICD-10-CM | POA: Diagnosis not present

## 2021-07-06 DIAGNOSIS — C3491 Malignant neoplasm of unspecified part of right bronchus or lung: Secondary | ICD-10-CM | POA: Diagnosis not present

## 2021-07-06 DIAGNOSIS — C7971 Secondary malignant neoplasm of right adrenal gland: Secondary | ICD-10-CM | POA: Diagnosis not present

## 2021-07-06 DIAGNOSIS — I10 Essential (primary) hypertension: Secondary | ICD-10-CM | POA: Insufficient documentation

## 2021-07-06 DIAGNOSIS — R21 Rash and other nonspecific skin eruption: Secondary | ICD-10-CM | POA: Diagnosis not present

## 2021-07-06 DIAGNOSIS — C7951 Secondary malignant neoplasm of bone: Secondary | ICD-10-CM | POA: Insufficient documentation

## 2021-07-06 DIAGNOSIS — Z515 Encounter for palliative care: Secondary | ICD-10-CM

## 2021-07-06 DIAGNOSIS — R69 Illness, unspecified: Secondary | ICD-10-CM | POA: Diagnosis not present

## 2021-07-06 DIAGNOSIS — R634 Abnormal weight loss: Secondary | ICD-10-CM

## 2021-07-06 DIAGNOSIS — C3411 Malignant neoplasm of upper lobe, right bronchus or lung: Secondary | ICD-10-CM | POA: Insufficient documentation

## 2021-07-06 LAB — CMP (CANCER CENTER ONLY)
ALT: 23 U/L (ref 0–44)
AST: 31 U/L (ref 15–41)
Albumin: 3.6 g/dL (ref 3.5–5.0)
Alkaline Phosphatase: 52 U/L (ref 38–126)
Anion gap: 6 (ref 5–15)
BUN: 10 mg/dL (ref 8–23)
CO2: 29 mmol/L (ref 22–32)
Calcium: 8.6 mg/dL — ABNORMAL LOW (ref 8.9–10.3)
Chloride: 102 mmol/L (ref 98–111)
Creatinine: 0.52 mg/dL (ref 0.44–1.00)
GFR, Estimated: >60 mL/min (ref >60)
Glucose, Bld: 91 mg/dL (ref 70–99)
Potassium: 3.7 mmol/L (ref 3.5–5.1)
Sodium: 137 mmol/L (ref 135–145)
Total Bilirubin: 0.2 mg/dL — ABNORMAL LOW (ref 0.3–1.2)
Total Protein: 6.9 g/dL (ref 6.5–8.1)

## 2021-07-06 LAB — CBC WITH DIFFERENTIAL (CANCER CENTER ONLY)
Abs Immature Granulocytes: 0.01 10*3/uL (ref 0.00–0.07)
Basophils Absolute: 0 10*3/uL (ref 0.0–0.1)
Basophils Relative: 0 %
Eosinophils Absolute: 0.3 10*3/uL (ref 0.0–0.5)
Eosinophils Relative: 5 %
HCT: 35.5 % — ABNORMAL LOW (ref 36.0–46.0)
Hemoglobin: 11.7 g/dL — ABNORMAL LOW (ref 12.0–15.0)
Immature Granulocytes: 0 %
Lymphocytes Relative: 20 %
Lymphs Abs: 1 10*3/uL (ref 0.7–4.0)
MCH: 31.2 pg (ref 26.0–34.0)
MCHC: 33 g/dL (ref 30.0–36.0)
MCV: 94.7 fL (ref 80.0–100.0)
Monocytes Absolute: 0.7 10*3/uL (ref 0.1–1.0)
Monocytes Relative: 14 %
Neutro Abs: 2.9 10*3/uL (ref 1.7–7.7)
Neutrophils Relative %: 61 %
Platelet Count: 225 10*3/uL (ref 150–400)
RBC: 3.75 MIL/uL — ABNORMAL LOW (ref 3.87–5.11)
RDW: 19.6 % — ABNORMAL HIGH (ref 11.5–15.5)
WBC Count: 4.8 10*3/uL (ref 4.0–10.5)
nRBC: 0 % (ref 0.0–0.2)

## 2021-07-06 LAB — TSH: TSH: 1.924 u[IU]/mL (ref 0.308–3.960)

## 2021-07-06 MED ORDER — FOLIC ACID 1 MG PO TABS: 1.0000 mg | ORAL_TABLET | Freq: Every day | ORAL | 2 refills | Status: DC

## 2021-07-06 MED ORDER — SODIUM CHLORIDE 0.9 % IV SOLN: Freq: Once | INTRAVENOUS | Status: AC

## 2021-07-06 MED ORDER — SODIUM CHLORIDE 0.9 % IV SOLN
500.0000 mg/m2 | Freq: Once | INTRAVENOUS | Status: AC
  Administered 2021-07-06: 700 mg via INTRAVENOUS
  Filled 2021-07-06: qty 20

## 2021-07-06 NOTE — Progress Notes (Signed)
Nutrition Follow-up:  Patient currently receiving maintenance Alimta for stage IV  non small cell lung cancer.   Met with patient during infusion. She reports appetite is not "marvelous" but is getting better. Patient is taking Marinol. She feels this has helped increase her appetite. She is eating 3 meals with snacks in between. Last night patient ate 3/4 of filet and 1/2 large sweet potato with butter/cinnamon. Patient has been drinking Premier and Ensure Plus, but is not consistent. Patient reports she forgets they are in there. Patient is going to try to drink these daily.   Medications: reviewed   Labs: reviewed   Anthropometrics: Weight 88 lb 3.2 oz today stable x 3 weeks  1/25 - 89 lb 6.4 oz  1/04 - 92 lb 9.6 oz   NUTRITION DIAGNOSIS: Severe malnutrition continues   INTERVENTION:  Continue eating high calorie high protein foods  Continue taking appetite stimulant as prescribed per MD Patient will work to increase intake of Ensure/Premier, recommend 2 Ensure Plus and one Premier day Patient has contact information    MONITORING, EVALUATION, GOAL: weight trends, intake    NEXT VISIT: Wednesday March 29 during infusion

## 2021-07-06 NOTE — Patient Instructions (Signed)
[facility]   Discharge Instructions: Thank you for choosing [facility] to provide your oncology and hematology care.   If you have a lab appointment with the [facility], please go directly to the [facility] and check in at the registration area.   Wear comfortable clothing and clothing appropriate for easy access to any Portacath or PICC line.   We strive to give you quality time with your provider. You may need to reschedule your appointment if you arrive late (15 or more minutes).  Arriving late affects you and other patients whose appointments are after yours.  Also, if you miss three or more appointments without notifying the office, you may be dismissed from the clinic at the providers discretion.      For prescription refill requests, have your pharmacy contact our office and allow 72 hours for refills to be completed.    Today you received the following chemotherapy and/or immunotherapy agents: pemetrexed      To help prevent nausea and vomiting after your treatment, we encourage you to take your nausea medication as directed.  BELOW ARE SYMPTOMS THAT SHOULD BE REPORTED IMMEDIATELY: *FEVER GREATER THAN 100.4 F (38 C) OR HIGHER *CHILLS OR SWEATING *NAUSEA AND VOMITING THAT IS NOT CONTROLLED WITH YOUR NAUSEA MEDICATION *UNUSUAL SHORTNESS OF BREATH *UNUSUAL BRUISING OR BLEEDING *URINARY PROBLEMS (pain or burning when urinating, or frequent urination) *BOWEL PROBLEMS (unusual diarrhea, constipation, pain near the anus) TENDERNESS IN MOUTH AND THROAT WITH OR WITHOUT PRESENCE OF ULCERS (sore throat, sores in mouth, or a toothache) UNUSUAL RASH, SWELLING OR PAIN  UNUSUAL VAGINAL DISCHARGE OR ITCHING   Items with * indicate a potential emergency and should be followed up as soon as possible or go to the Emergency Department if any problems should occur.  Please show the CHEMOTHERAPY ALERT CARD or IMMUNOTHERAPY ALERT CARD at check-in  to the Emergency Department and triage nurse.  Should you have questions after your visit or need to cancel or reschedule your appointment, please contact [facility]  Dept: [phone]  and follow the prompts.  Office hours are 8:00 a.m. to 4:30 p.m. Monday - Friday. Please note that voicemails left after 4:00 p.m. may not be returned until the following business day.  We are closed weekends and major holidays. You have access to a nurse at all times for urgent questions. Please call the main number to the clinic Dept: [phone] and follow the prompts.   For any non-urgent questions, you may also contact your provider using MyChart. We now offer e-Visits for anyone 53 and older to request care online for non-urgent symptoms. For details visit [email or web address].   Also download the MyChart app! Go to the app store, search "MyChart", open the app, select [facility], and log in with your MyChart username and password.  Due to Covid, a mask is required upon entering the hospital/clinic. If you do not have a mask, one will be given to you upon arrival. For doctor visits, patients may have 1 support person aged 58 or older with them. For treatment visits, patients cannot have anyone with them due to current Covid guidelines and our immunocompromised population.  Enoxaparin Injection What is this medication? ENOXAPARIN (ee nox a PA rin) prevents or treats blood clots. It belongs to a group of medications called blood thinners. This medicine may be used for other purposes; ask your health care provider or pharmacist if you have questions. COMMON BRAND NAME(S):  Lovenox What should I tell my care team before I take this medication? They need to know if you have any of these conditions: Bleeding disorders, hemorrhage, or hemophilia Infection of the heart or heart valves Kidney or liver disease Previous stroke Prosthetic heart valve Recent surgery or delivery of a  baby Ulcer in the stomach or intestine, diverticulitis, or other bowel disease An unusual or allergic reaction to enoxaparin, heparin, pork or pork products, other medications, foods, dyes, or preservatives Pregnant or trying to get pregnant Breast-feeding How should I use this medication? This medication is injected under the skin. You will be taught how to prepare and give it. For your therapy to work as well as possible, take each dose exactly as prescribed on the prescription label. Do not skip doses. Skipping or stopping this medication can increase your risk of a blood clot. Keep taking this medication unless your care team tells you to stop. It is important that you put your used needles and syringes in a special sharps container. Do not put them in a trash can. If you do not have a sharps container, call your care team to get one. This medication comes with INSTRUCTIONS FOR USE. Ask your pharmacist for directions on how to use this medicine. Read the information carefully. Talk to your care team if you have questions. Talk to your care team about use of this medication in children. Special care may be needed. Overdosage: If you think you have taken too much of this medicine contact a poison control center or emergency room at once. NOTE: This medicine is only for you. Do not share this medicine with others. What if I miss a dose? If you miss a dose, take it as soon as you can. If it is almost time for your next dose, take only that dose. Do not take double or extra doses. What may interact with this medication? Aspirin and aspirin-like medications Certain medications that treat or prevent blood clots Dipyridamole NSAIDs, medications for pain and inflammation, like ibuprofen or naproxen This list may not describe all possible interactions. Give your health care provider a list of all the medicines, herbs, non-prescription drugs, or dietary supplements you use. Also tell them if you smoke,  drink alcohol, or use illegal drugs. Some items may interact with your medicine. What should I watch for while using this medication? Visit your care team for regular checks on your progress. You may need blood work done while you are taking this medication. Your condition will be monitored carefully while you are receiving this medication. It is important not to miss any appointments. If you are going to need surgery or other procedure, tell your care team that you are using this medication. Using this medication for a long time may weaken your bones and increase the risk of bone fractures. Avoid sports and activities that might cause injury while you are using this medication. Severe falls or injuries can cause unseen bleeding. Be careful when using sharp tools or knives. Consider using an Copy. Take special care brushing or flossing your teeth. Report any injuries, bruising, or red spots on the skin to your care team. Wear a medical ID bracelet or chain. Carry a card that describes your disease and details of your medication and dosage times. What side effects may I notice from receiving this medication? Side effects that you should report to your care team as soon as possible: Allergic reactions--skin rash, itching, hives, swelling of the face, lips, tongue,  or throat Bleeding--bloody or black, tar-like stools, vomiting blood or brown material that looks like coffee grounds, red or dark brown urine, small red or purple spots on skin, unusual bruising or bleeding Side effects that usually do not require medical attention (report to your care team if they continue or are bothersome): Pain, redness, or irritation at the injection site Swelling of the ankles, hands, or feet This list may not describe all possible side effects. Call your doctor for medical advice about side effects. You may report side effects to FDA at 1-800-FDA-1088. Where should I keep my medication? Keep out of the reach  of children. Store at room temperature between 15 and 30 degrees C (59 and 86 degrees F). Do not freeze. If your injections have been specially prepared, you may need to store them in the refrigerator. Ask your pharmacist. Throw away any unused medication after the expiration date. NOTE: This sheet is a summary. It may not cover all possible information. If you have questions about this medicine, talk to your doctor, pharmacist, or health care provider.  2022 Elsevier/Gold Standard (2020-07-16 00:00:00)

## 2021-07-07 ENCOUNTER — Encounter: Payer: Self-pay | Admitting: Physician Assistant

## 2021-07-07 ENCOUNTER — Encounter: Payer: Self-pay | Admitting: Internal Medicine

## 2021-07-07 NOTE — Progress Notes (Signed)
[facility]  Telephone:([phone] [phone]) [phone]   Name: Kristi Jennings Date: 07/07/2021 MRN: [id number]  DOB: [birth year]  Patient Care Team: [name], MD as PCP - General (Internal Medicine) [facility] as Registered Nurse [facility] and Palliative Medicine)    INTERVAL HISTORY: Kristi Jennings is a 76 y.o. 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 Palliative ask to see for symptom management.  SOCIAL HISTORY:     reports that she quit smoking about 12 years ago. Her smoking use included cigarettes. She has a 43.00 pack-year smoking history. She has never used smokeless tobacco. She reports that she does not currently use alcohol. She reports that she does not use drugs.  ADVANCE DIRECTIVES:  None on file   CODE STATUS:   PAST MEDICAL HISTORY: Past Medical History:  Diagnosis Date   Borderline diabetes mellitus    Bronchogenic cancer of right lung (HCC)    Dyslipidemia    Hypertension    Polymyalgia rheumatica (HCC)    ALLERGIES:  is allergic to shellfish-derived products and augmentin [amoxicillin-pot clavulanate].  MEDICATIONS:  Current Outpatient Medications  Medication Sig Dispense Refill   acetaminophen (TYLENOL) 650 MG CR tablet Take 1,300 mg by mouth 3 (three) times daily as needed for pain.     aspirin EC 81 MG tablet Take 81 mg by mouth 4 (four) times a week. Swallow whole.     atenolol (TENORMIN) 25 MG tablet Take 12.5 mg by mouth at bedtime.     Calcium-Vitamin D-Vitamin K (CALCIUM + D) [phone] MG-UNT-MCG CHEW      Cholecalciferol (VITAMIN D) 50 MCG (2000 UT) tablet      diclofenac Sodium (VOLTAREN) 1 % GEL Apply 4 g topically 4 (four) times daily. (Patient taking differently: Apply 4 g topically 4 (four) times daily as needed (pain).) 150 g 0   dronabinol (MARINOL) 5 MG capsule Take 1  capsule (5 mg total) by mouth 2 (two) times daily before a meal. 30 capsule 1   enoxaparin (LOVENOX) 60 MG/0.6ML injection Inject 0.6 mLs (60 mg total) into the skin daily. 18 mL 2   escitalopram (LEXAPRO) 20 MG tablet Take 20 mg by mouth at bedtime.     folic acid (FOLVITE) 1 MG tablet Take 1 tablet (1 mg total) by mouth daily. 30 tablet 2   HYDROmorphone (DILAUDID) 4 MG tablet Take 1 tablet (4 mg total) by mouth every 6 (six) hours as needed for severe pain or moderate pain. 120 tablet 0   hydrOXYzine (ATARAX) 10 MG tablet Take 1 tablet (10 mg total) by mouth 3 (three) times daily as needed. 90 tablet 0   losartan (COZAAR) 50 MG tablet Take 50 mg by mouth daily. (Patient not taking: Reported on 05/19/2021)     methylPREDNISolone (MEDROL DOSEPAK) 4 MG TBPK tablet Use as instructed (Patient not taking: Reported on 05/19/2021) 21 tablet 0   morphine (MS CONTIN) 30 MG 12 hr tablet Take 1 tablet (30 mg total) by mouth every 12 (twelve) hours. 60 tablet 0   Multiple Vitamin (MULTI VITAMIN) TABS      ondansetron (ZOFRAN-ODT) 4 MG disintegrating tablet Take 4 mg by mouth 2 (two) times daily as needed for nausea or vomiting. (Patient not taking: Reported on 03/16/2021)     prochlorperazine (COMPAZINE) 10 MG tablet Take 1 tablet (10 mg total) by mouth every 6 (six)  hours as needed for nausea or vomiting. (Patient not taking: Reported on 03/16/2021) 30 tablet 0   rosuvastatin (CRESTOR) 10 MG tablet Take 10 mg by mouth at bedtime. (Patient not taking: Reported on 05/19/2021)     No current facility-administered medications for this visit.    VITAL SIGNS: There were no vitals taken for this visit. There were no vitals filed for this visit.   Estimated body mass index is 14.68 kg/m as calculated from the following:   Height as of 06/15/21: 5\' 5"  (1.651 m).   Weight as of an earlier encounter on 07/06/21: 88 lb 3.2 oz (40 kg).  LABS: CBC:    Component Value Date/Time   WBC 4.8 07/06/2021 0943   WBC  2.7 (L) 01/05/2021 0543   HGB 11.7 (L) 07/06/2021 0943   HCT 35.5 (L) 07/06/2021 0943   PLT 225 07/06/2021 0943   MCV 94.7 07/06/2021 0943   NEUTROABS 2.9 07/06/2021 0943   LYMPHSABS 1.0 07/06/2021 0943   MONOABS 0.7 07/06/2021 0943   EOSABS 0.3 07/06/2021 0943   BASOSABS 0.0 07/06/2021 0943   Comprehensive Metabolic Panel:    Component Value Date/Time   NA 137 07/06/2021 0943   K 3.7 07/06/2021 0943   CL 102 07/06/2021 0943   CO2 29 07/06/2021 0943   BUN 10 07/06/2021 0943   CREATININE 0.52 07/06/2021 0943   GLUCOSE 91 07/06/2021 0943   CALCIUM 8.6 (L) 07/06/2021 0943   AST 31 07/06/2021 0943   ALT 23 07/06/2021 0943   ALKPHOS 52 07/06/2021 0943   BILITOT 0.2 (L) 07/06/2021 0943   PROT 6.9 07/06/2021 0943   ALBUMIN 3.6 07/06/2021 0943     PERFORMANCE STATUS (ECOG) : 1 - Symptomatic but completely ambulatory   Physical Exam General: NAD Neurological: AAO x4  IMPRESSION:  I connected with Kristi Jennings and her husband for follow-up. She continues to feel "like her normal self". Ambulatory without assistance and able to do some task around the home. Tolerating treatments well. Continues to express appreciation of ongoing support.   Pain Kristi Jennings's pain is much improved. She continues to take MS Contin twice daily however has not required much of the oral dilaudid for breakthrough medication which she is appreciative of. No refills due at this time. Husband expressed he will call when needed. If patient continues to do well we can certainly begin to try to wean down her dose monitoring closely for any changes.   Constipation No concerns with constipation.  Continues to take MiraLAX and senna as needed for bowel regimen.  Anxiety Her anxiety has decreased significantly mainly due to to her improved wellbeing and decreased pain.  She states she is focusing on continued improvement and hopes things remain stable.   Decreased appetite/Weight loss Appetite is much  improved. Encouraged continue with small frequent meals and focusing on high protein intake. Her weight has decreased some to 88lbs were she was previously 89lbs on 1/25 and 92lb on 1/11.  Will continue to monitor.  Overall patient is doing much better and symptom burden has drastically decreased.  We will continue to support and follow as needed.  PLAN: She is very much improved. Continue MS Contin  every 12 hours (we will plan to wean over time as tolerated with close monitoring) Patient is not requiring breakthrough medication. Much improved. Dronabinol 5 mg twice daily for appetite Ongoing symptom management (see above) I will plan to see patient back in 3-4 weeks in collaboration with her other appointments.  Patient expressed understanding and was in agreement with this plan. She also understands that She can call the clinic at any time with any questions, concerns, or complaints.   Time Total: 20 min.   Visit consisted of counseling and education dealing with the complex and emotionally intense issues of symptom management and palliative care in the setting of serious and potentially life-threatening illness.Greater than 50%  of this time was spent counseling and coordinating care related to the above assessment and plan.  Alda Lea, AGPCNP-BC  Palliative Medicine Team/[place]

## 2021-07-09 DIAGNOSIS — C3491 Malignant neoplasm of unspecified part of right bronchus or lung: Secondary | ICD-10-CM | POA: Diagnosis not present

## 2021-07-13 ENCOUNTER — Encounter: Payer: Self-pay | Admitting: Radiology

## 2021-07-14 ENCOUNTER — Encounter: Payer: Self-pay | Admitting: Family Medicine

## 2021-07-14 ENCOUNTER — Ambulatory Visit (INDEPENDENT_AMBULATORY_CARE_PROVIDER_SITE_OTHER): Payer: Medicare HMO | Admitting: Family Medicine

## 2021-07-14 VITALS — BP 133/62 | HR 60 | Resp 20 | Ht 65.0 in | Wt 85.4 lb

## 2021-07-14 DIAGNOSIS — L299 Pruritus, unspecified: Secondary | ICD-10-CM | POA: Diagnosis not present

## 2021-07-14 DIAGNOSIS — Z7689 Persons encountering health services in other specified circumstances: Secondary | ICD-10-CM

## 2021-07-14 MED ORDER — HYDROXYZINE PAMOATE 25 MG PO CAPS
25.0000 mg | ORAL_CAPSULE | Freq: Three times a day (TID) | ORAL | 3 refills | Status: DC | PRN
Start: 2021-07-14 — End: 2021-08-02

## 2021-07-14 MED ORDER — HYDROXYZINE PAMOATE 25 MG PO CAPS: 25.0000 mg | ORAL_CAPSULE | Freq: Three times a day (TID) | ORAL | 0 refills | Status: DC | PRN

## 2021-07-14 NOTE — Progress Notes (Signed)
______________________________________________________________________  HPI Kristi Jennings is a 76 y.o. female presenting to [facility] today to establish care.   Patient Care Team: [name], NP as PCP - General (Family Medicine) [facility] as Registered Nurse [facility] and Palliative Medicine)  Health Maintenance  Topic Date Due   Hepatitis C Screening: USPSTF Recommendation to screen - Ages 37-79 yo.  Never done   Mammogram  12/09/2008   DEXA scan (bone density measurement)  Never done   Tetanus Vaccine  01/29/2029   Colon Cancer Screening  09/17/2030   Pneumonia Vaccine  Completed   Flu Shot  Completed   COVID-19 Vaccine  Completed   Zoster (Shingles) Vaccine  Completed   HPV Vaccine  Aged Out     Concerns today: Lung cancer: following with cancer center and palliative medicine, no new concerns today Rash on back x1 month- treating with triamcinolone cream, itchy, seeing dermatologist next week. Thinks may be related to cancer drugs. Has been taking 10 mg hydroxyzine but doesn't feel like it is doing anything.    Patient Active Problem List   Diagnosis Date Noted   Palliative care patient 05/12/2021   Muscle ache 04/13/2021   Metastatic bone tumor ([place]) 01/20/2021   Pulmonary emboli ([place]) 01/01/2021   Compression fracture of T12 vertebra ([place]) 01/01/2021   Lumbar radiculopathy 01/01/2021   Non-small cell carcinoma of lung, stage 4, right ([place]) 11/30/2020   Encounter for antineoplastic chemotherapy 11/30/2020   Encounter for antineoplastic immunotherapy 11/30/2020   Cancer associated pain 11/30/2020   Malnutrition, calorie ([place]) 11/30/2020   Polymyalgia rheumatica ([place]) 08/12/2019   Allergic rhinitis 06/10/2015   Anxiety 06/10/2015   Benign essential hypertension 06/10/2015    PHQ9 Today: Depression screen PHQ 2/9 07/14/2021  Decreased Interest 0  Down, Depressed, Hopeless 0  PHQ - 2 Score 0   GAD7  Today: No flowsheet data found. ______________________________________________________________________ PMH Past Medical History:  Diagnosis Date   Borderline diabetes mellitus    Bronchogenic cancer of right lung ([place])    Dyslipidemia    History of radiation therapy    right chest 12/21/2020-12/24/2020  Dr Gery Pray   History of radiation therapy    right lung 06/07/2021-06/17/2021  Dr Gery Pray   Hypertension    Polymyalgia rheumatica ([place])     ROS All review of systems negative except what is listed in the HPI  PHYSICAL EXAM Physical Exam Vitals reviewed.  Constitutional:      General: She is not in acute distress.    Appearance: Normal appearance. She is normal weight.  Cardiovascular:     Rate and Rhythm: Normal rate and regular rhythm.  Pulmonary:     Effort: Pulmonary effort is normal.     Breath sounds: Normal breath sounds.  Musculoskeletal:     Cervical back: Normal range of motion and neck supple.  Skin:    Capillary Refill: Capillary refill takes less than 2 seconds.     Findings: Rash present.     Comments: Right mid/lower back with raised erythematous rash, no vesicles, papules, drainage, abrasions   Neurological:     General: No focal deficit present.     Mental Status: She is alert and oriented to person, place, and time. Mental status is at baseline.  Psychiatric:        Mood and Affect: Mood normal.        Behavior: Behavior normal.        Thought Content:  Thought content normal.        Judgment: Judgment normal.   ______________________________________________________________________ ASSESSMENT AND PLAN  1. Encounter to establish care Education provided today during visit and on AVS for patient to review at home.  Diet and Exercise recommendations provided.  Current diagnoses and recommendations discussed. HM recommendations reviewed with recommendations.   2. Itching Continue triamcinolone cream and add fragrance/dye-free lotion to keep from  drying out. Increasing hydroxyzine - educated on safety. Keep upcoming dermatologist appointment.   - hydrOXYzine (VISTARIL) 25 MG capsule; Take 1 capsule (25 mg total) by mouth every 8 (eight) hours as needed.  Dispense: 30 capsule; Refill: 3      Outpatient Encounter Medications as of 07/14/2021  Medication Sig   acetaminophen (TYLENOL) 650 MG CR tablet Take 1,300 mg by mouth 3 (three) times daily as needed for pain.   aspirin EC 81 MG tablet Take 81 mg by mouth 4 (four) times a week. Swallow whole.   atenolol (TENORMIN) 25 MG tablet Take 12.5 mg by mouth at bedtime.   Calcium-Vitamin D-Vitamin K (CALCIUM + D) [id number] MG-UNT-MCG CHEW    Cholecalciferol (VITAMIN D) 50 MCG (2000 UT) tablet    diclofenac Sodium (VOLTAREN) 1 % GEL Apply 4 g topically 4 (four) times daily. (Patient taking differently: Apply 4 g topically 4 (four) times daily as needed (pain).)   dronabinol (MARINOL) 5 MG capsule Take 1 capsule (5 mg total) by mouth 2 (two) times daily before a meal.   enoxaparin (LOVENOX) 60 MG/0.6ML injection Inject 0.6 mLs (60 mg total) into the skin daily.   escitalopram (LEXAPRO) 20 MG tablet Take 20 mg by mouth at bedtime.   folic acid (FOLVITE) 1 MG tablet Take 1 tablet (1 mg total) by mouth daily.   HYDROmorphone (DILAUDID) 4 MG tablet Take 1 tablet (4 mg total) by mouth every 6 (six) hours as needed for severe pain or moderate pain.   losartan (COZAAR) 50 MG tablet Take 50 mg by mouth daily.   methylPREDNISolone (MEDROL DOSEPAK) 4 MG TBPK tablet Use as instructed   morphine (MS CONTIN) 30 MG 12 hr tablet Take 1 tablet (30 mg total) by mouth every 12 (twelve) hours.   Multiple Vitamin (MULTI VITAMIN) TABS    ondansetron (ZOFRAN-ODT) 4 MG disintegrating tablet Take 4 mg by mouth 2 (two) times daily as needed for nausea or vomiting.   prochlorperazine (COMPAZINE) 10 MG tablet Take 1 tablet (10 mg total) by mouth every 6 (six) hours as needed for nausea or vomiting.   rosuvastatin  (CRESTOR) 10 MG tablet Take 10 mg by mouth at bedtime.   [DISCONTINUED] hydrOXYzine (ATARAX) 10 MG tablet Take 1 tablet (10 mg total) by mouth 3 (three) times daily as needed.   [DISCONTINUED] hydrOXYzine (VISTARIL) 25 MG capsule Take 1 capsule (25 mg total) by mouth every 8 (eight) hours as needed.   hydrOXYzine (VISTARIL) 25 MG capsule Take 1 capsule (25 mg total) by mouth every 8 (eight) hours as needed.   No facility-administered encounter medications on file as of 07/14/2021.    Return in about 3 months (around 10/11/2021) for routine f/u.  I spent 30 minutes dedicated to the care of this patient on the date of this encounter to include pre-visit chart review of prior notes and results, face-to-face time with the patient, and post-visit ordering of testing as indicated.    [place] Olevia Bowens, DNP, FNP-C

## 2021-07-14 NOTE — Patient Instructions (Signed)
Thank you for choosing [facility] at [facility] for your Primary Care needs. I am excited for the opportunity to partner with you to meet your health care goals. It was a pleasure meeting you today!   Information on diet, exercise, and health maintenance recommendations are listed below. This is information to help you be sure you are on track for optimal health and monitoring.   Please look over this and let us know if you have any questions or if you have completed any of the health maintenance outside of [place] so that we can be sure your records are up to date.  ___________________________________________________________  MyChart:  For all urgent or time sensitive needs we ask that you please call the office to avoid delays. Our number is [phone]. MyChart is not constantly monitored and due to the large volume of messages a day, replies may take up to 72 business hours.  MyChart Policy: MyChart allows for you to see your visit notes, after visit summary, provider recommendations, lab and tests results, make an appointment, request refills, and contact your provider or the office for non-urgent questions or concerns. Providers are seeing patients during normal business hours and do not have built in time to review MyChart messages.  We ask that you allow a minimum of 3 business days for responses to [employer]. For this reason, please do not send urgent requests through [place]. Please call the office at [phone]. New and ongoing conditions may require a visit. We have virtual and in-person visits available for your convenience.  Complex MyChart concerns may require a visit. Your provider may request you schedule a virtual or in-person visit to ensure we are providing the best care possible. MyChart messages sent after 11:00 AM on Friday will not be received by the provider until Monday morning.    Lab and Test Results: You will receive your lab and  test results on MyChart as soon as they are completed and results have been sent by the lab or testing facility. Due to this service, you will receive your results BEFORE your provider.  I review lab and test results each morning prior to seeing patients. Some results require collaboration with other providers to ensure you are receiving the most appropriate care. For this reason, we ask that you please allow a minimum of 3-5 business days from the time that ALL results have been received for your provider to receive and review lab and test results and contact you about these.  Most lab and test result comments from the provider will be sent through [place]. Your provider may recommend changes to the plan of care, follow-up visits, repeat testing, ask questions, or request an office visit to discuss these results. You may reply directly to this message or call the office to provide information for the provider or set up an appointment. In some instances, you will be called with test results and recommendations. Please let us know if this is preferred and we will make note of this in your chart to provide this for you.    If you have not heard a response to your lab or test results in 5 business days from all results returning to [place], please call the office to let us know. We ask that you please avoid calling prior to this time unless there is an emergent concern. Due to high call volumes, this can delay the resulting process.  After Hours: For all non-emergency after hours needs,  please call the office at [phone] and select the option to reach the on-call  service. On-call services are shared between multiple [place] offices and therefore it will not be possible to speak directly with your provider. On-call providers may provide medical advice and recommendations, but are unable to provide refills for maintenance medications.  For all emergency or urgent medical needs after normal business  hours, we recommend that you seek care at the closest Urgent Care or Emergency Department to ensure appropriate treatment in a timely manner.  [facility] has a 24 hour emergency room located on the ground floor for your convenience.   Urgent Concerns During the Business Day Providers are seeing patients from 8AM to [place] with a busy schedule and are most often not able to respond to non-urgent calls until the end of the day or the next business day. If you should have URGENT concerns during the day, please call and speak to the nurse or schedule a same day appointment so that we can address your concern without delay.   Thank you, again, for choosing me as your health care partner. I appreciate your trust and look forward to learning more about you.   [employer]cell [employer] Olevia Bowens, DNP, FNP-C  ___________________________________________________________  Health Maintenance Recommendations Screening Testing Mammogram Every 1-2 years based on history and risk factors Starting at age 2 Pap Smear Ages 21-39 every 3 years Ages 46-65 every 5 years with HPV testing More frequent testing may be required based on results and history Colon Cancer Screening Every 1-10 years based on test performed, risk factors, and history Starting at age 37 Bone Density Screening Every 2-10 years based on history Starting at age 42 for women Recommendations for men differ based on medication usage, history, and risk factors AAA Screening One time ultrasound Men 27-48 years old who have ever smoked Lung Cancer Screening Low Dose Lung CT every 12 months Age 43-80 years with a 20 pack-year smoking history who still smoke or who have quit within the last 15 years  Screening Labs Routine  Labs: Complete Blood Count (CBC), Complete Metabolic Panel (CMP), Cholesterol (Lipid Panel) Every 6-12 months based on history and medications May be recommended more frequently based on current conditions or  previous results Hemoglobin A1c Lab Every 3-12 months based on history and previous results Starting at age 39 or earlier with diagnosis of diabetes, high cholesterol, BMI >26, and/or risk factors Frequent monitoring for patients with diabetes to ensure blood sugar control Thyroid Panel (TSH w/ T3 & T4) Every 6 months based on history, symptoms, and risk factors May be repeated more often if on medication HIV One time testing for all patients 55 and older May be repeated more frequently for patients with increased risk factors or exposure Hepatitis C One time testing for all patients 26 and older May be repeated more frequently for patients with increased risk factors or exposure Gonorrhea, Chlamydia Every 12 months for all sexually active persons 13-24 years Additional monitoring may be recommended for those who are considered high risk or who have symptoms PSA Men 50-3 years old with risk factors Additional screening may be recommended from age 44-69 based on risk factors, symptoms, and history  Vaccine Recommendations Tetanus Booster All adults every 10 years Flu Vaccine All patients 6 months and older every year COVID Vaccine All patients 12 years and older Initial dosing with booster May recommend additional booster based on age and health history HPV Vaccine 2 doses all patients  age 55-26 Dosing may be considered for patients over 26 Shingles Vaccine (Shingrix) 2 doses all adults 25 years and older Pneumonia (Pneumovax 26) All adults 46 years and older May recommend earlier dosing based on health history Pneumonia (Prevnar 62) All adults 65 years and older Dosed 1 year after Pneumovax 23 Pneumonia (Prevnar 66) All adults 66 years and older (adults 32-95 with certain conditions or risk factors) 1 dose  For those who have no received Prevnar 13 vaccine previously   Additional Screening, Testing, and Vaccinations may be recommended on an individualized basis based on  family history, health history, risk factors, and/or exposure.  __________________________________________________________  Diet Recommendations for All Patients  I recommend that all patients maintain a diet low in saturated fats, carbohydrates, and cholesterol. While this can be challenging at first, it is not impossible and small changes can make big differences.  Things to try: Decreasing the amount of soda, sweet tea, and/or juice to one or less per day and replace with water While water is always the first choice, if you do not like water you may consider adding a water additive without sugar to improve the taste other sugar free drinks Replace potatoes with a brightly colored vegetable at dinner Use healthy oils, such as canola oil or olive oil, instead of butter or hard margarine Limit your bread intake to two pieces or less a day Replace regular pasta with low carb pasta options Bake, broil, or grill foods instead of frying Monitor portion sizes  Eat smaller, more frequent meals throughout the day instead of large meals  An important thing to remember is, if you love foods that are not great for your health, you don't have to give them up completely. Instead, allow these foods to be a reward when you have done well. Allowing yourself to still have special treats every once in a while is a nice way to tell yourself thank you for working hard to keep yourself healthy.   Also remember that every day is a new day. If you have a bad day and "fall off the wagon", you can still climb right back up and keep moving along on your journey!  We have resources available to help you!  Some websites that may be helpful include: www.http://carter.biz/  Www.VeryWellFit.com _____________________________________________________________  Activity Recommendations for All Patients  I recommend that all adults get at least 20 minutes of moderate physical activity that elevates your heart rate at least 5  days out of the week.  Some examples include: Walking or jogging at a pace that allows you to carry on a conversation Cycling (stationary bike or outdoors) Water aerobics Yoga Weight lifting Dancing If physical limitations prevent you from putting stress on your joints, exercise in a pool or seated in a chair are excellent options.  Do determine your MAXIMUM heart rate for activity: YOUR AGE - 220 = MAX HeartRate   Remember! Do not push yourself too hard.  Start slowly and build up your pace, speed, weight, time in exercise, etc.  Allow your body to rest between exercise and get good sleep. You will need more water than normal when you are exerting yourself. Do not wait until you are thirsty to drink. Drink with a purpose of getting in at least 8, 8 ounce glasses of water a day plus more depending on how much you exercise and sweat.    If you begin to develop dizziness, chest pain, abdominal pain, jaw pain, shortness of breath, headache, vision  changes, lightheadedness, or other concerning symptoms, stop the activity and allow your body to rest. If your symptoms are severe, seek emergency evaluation immediately. If your symptoms are concerning, but not severe, please let us know so that we can recommend further evaluation.

## 2021-07-15 DIAGNOSIS — C3491 Malignant neoplasm of unspecified part of right bronchus or lung: Secondary | ICD-10-CM | POA: Diagnosis not present

## 2021-07-18 ENCOUNTER — Telehealth: Payer: Self-pay

## 2021-07-18 NOTE — Telephone Encounter (Signed)
Mrs. Boothe's husband called our office to ask about her Atarax medication. He stated that her Family Practitioner increased the dose of her medication and that it is now a capsule. I explained to him that this was okay. He stated he also bought a spray to help with the itching as well. I advised him to try that along with the medicine and we can always decrease the dose if the spray helps in conjunction. Advised to call our office with any other questions. Understanding verbalized. All questions answered.

## 2021-07-19 DIAGNOSIS — C3490 Malignant neoplasm of unspecified part of unspecified bronchus or lung: Secondary | ICD-10-CM | POA: Diagnosis not present

## 2021-07-20 DIAGNOSIS — L2089 Other atopic dermatitis: Secondary | ICD-10-CM | POA: Diagnosis not present

## 2021-07-20 NOTE — Progress Notes (Incomplete)
°  Radiation Oncology         [phone] ________________________________  Patient Name: Kristi Jennings MRN: [id number] DOB: 1946-05-17 Referring Physician: Curt Bears (Profile Not Attached) Date of Service: 06/17/2021 [facility], [place]                                                        End Of Treatment Note  Diagnoses: C34.11-Malignant neoplasm of upper lobe, right bronchus or lung C79.51-Secondary malignant neoplasm of bone   Cancer Staging  Non-small cell carcinoma of lung, stage 4, right ([place]) Staging form: Lung, AJCC 8th Edition - Clinical: Stage IVB (cT3, cN2, cM1c) - Signed by Curt Bears, MD on 11/30/2020  Intent: Palliative  Radiation Treatment Dates: 06/07/2021 through 06/17/2021 Site Technique Total Dose (Gy) Dose per Fx (Gy) Completed Fx Beam Energies  Lung, Right: Lung_R IMRT 24/24 3 8/8 6X   Narrative: The patient tolerated radiation therapy quite well. During her final weekly treatment check on 06/14/21, the patient denied any side effects from treatment.  Plan: The patient will follow-up with radiation oncology in one month .  ________________________________________________ -----------------------------------  Blair Promise, PhD, MD  This document serves as a record of services personally performed by Gery Pray, MD. It was created on his behalf by Roney Mans, a trained medical scribe. The creation of this record is based on the scribe's personal observations and the provider's statements to them. This document has been checked and approved by the attending provider.

## 2021-07-20 NOTE — Progress Notes (Signed)
Radiation Oncology         [phone] ________________________________  Name: Kristi Jennings MRN: [id number]  Date: 07/21/2021  DOB: July 22, 1945  Follow-Up Visit Note  CC: [name], NP  Curt Bears, MD    ICD-10-CM   1. Non-small cell carcinoma of lung, stage 4, right [facility])  C34.91       Diagnosis: The primary encounter diagnosis was Non-small cell carcinoma of lung, stage 4, right ([place]). A diagnosis of Metastatic bone tumor ([place]) was also pertinent to this visit.   The encounter diagnosis was Non-small cell carcinoma of lung, stage 4, right ([place]).   Stage IV (T3, N2, M1 C) non-small cell lung cancer with unknown histologic subtype.  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  Interval Since Last Radiation: 1 month and 3 days   Intent: Palliative  2) Radiation Treatment Dates: 06/07/2021 through 06/17/2021 Site Technique Total Dose (Gy) Dose per Fx (Gy) Completed Fx Beam Energies  Lung, Right: Lung_R IMRT 24/24 3 8/8 6X   1) Treatment Dates: 12/13/20- 12/24/2020 Site/Dose: Chest_Rt - 30.00 of 30.00Gy, 10 of 10 fractions delivered; 3.00 Gy/Fx  Narrative:  The patient returns today for routine follow-up. The patient tolerated radiation therapy quite well. During her final weekly treatment check on 06/14/21, the patient denied any side effects from treatment.       Since the patient was last seen for re-evaluation on 06/01/21, the patient followed up with Dr. Julien Nordmann on 06/15/21 prior to starting cycle 9 of treatment. During which time, the patient reported feeling well other than some mild fatigue. She was also noted to be tolerating Almita maintenance fairly well.  Per her most recent follow up with Cassandra Heilingoetter PA-C on 07/06/21, the patient was again noted to be doing well overall. She was also noted to  report improvement of her pain with prednisone, and reported tolerating Lovenox well for her LE DVT.     Pertinent imaging performed in the interval includes:  -- CT of the chest abdomen and pelvis on 07/04/21 which demonstrated no evidence of disease progression      She reports feeling well at this time.  She has been able to go out to lunch with some of her friends which she has not been able to do for some time.  She denies any pain within the chest area significant cough or hemoptysis.                  Allergies:  is allergic to shellfish-derived products and augmentin [amoxicillin-pot clavulanate].  Meds: Current Outpatient Medications  Medication Sig Dispense Refill   acetaminophen (TYLENOL) 650 MG CR tablet Take 1,300 mg by mouth 3 (three) times daily as needed for pain.     aspirin EC 81 MG tablet Take 81 mg by mouth 4 (four) times a week. Swallow whole.     atenolol (TENORMIN) 25 MG tablet Take 12.5 mg by mouth at bedtime.     Calcium-Vitamin D-Vitamin K (CALCIUM + D) [phone] MG-UNT-MCG CHEW      Cholecalciferol (VITAMIN D) 50 MCG (2000 UT) tablet      diclofenac Sodium (VOLTAREN) 1 % GEL Apply 4 g topically 4 (four) times daily. (Patient taking differently: Apply 4 g topically 4 (four) times daily as needed (pain).) 150 g 0   dronabinol (MARINOL) 5 MG capsule  Take 1 capsule (5 mg total) by mouth 2 (two) times daily before a meal. 30 capsule 1   enoxaparin (LOVENOX) 60 MG/0.6ML injection Inject 0.6 mLs (60 mg total) into the skin daily. 18 mL 2   escitalopram (LEXAPRO) 20 MG tablet Take 20 mg by mouth at bedtime.     folic acid (FOLVITE) 1 MG tablet Take 1 tablet (1 mg total) by mouth daily. 30 tablet 2   HYDROmorphone (DILAUDID) 4 MG tablet Take 1 tablet (4 mg total) by mouth every 6 (six) hours as needed for severe pain or moderate pain. 120 tablet 0   hydrOXYzine (VISTARIL) 25 MG capsule Take 1 capsule (25 mg total) by mouth every 8 (eight) hours as needed. 30 capsule 3    morphine (MS CONTIN) 30 MG 12 hr tablet Take 1 tablet (30 mg total) by mouth every 12 (twelve) hours. 60 tablet 0   Multiple Vitamin (MULTI VITAMIN) TABS      methylPREDNISolone (MEDROL DOSEPAK) 4 MG TBPK tablet Use as instructed (Patient not taking: Reported on 07/21/2021) 21 tablet 0   ondansetron (ZOFRAN-ODT) 4 MG disintegrating tablet Take 4 mg by mouth 2 (two) times daily as needed for nausea or vomiting. (Patient not taking: Reported on 07/21/2021)     prochlorperazine (COMPAZINE) 10 MG tablet Take 1 tablet (10 mg total) by mouth every 6 (six) hours as needed for nausea or vomiting. (Patient not taking: Reported on 07/21/2021) 30 tablet 0   rosuvastatin (CRESTOR) 10 MG tablet Take 10 mg by mouth at bedtime. (Patient not taking: Reported on 07/21/2021)     No current facility-administered medications for this encounter.    Physical Findings: The patient is in no acute distress. Patient is alert and oriented.  height is 5\' 5"  (1.651 m) and weight is 90 lb 4 oz (40.9 kg). Her temporal temperature is 97.7 F (36.5 C). Her blood pressure is 147/78 (abnormal) and her pulse is 62. Her respiration is 20 and oxygen saturation is 98%. .   Lungs are clear to auscultation bilaterally. Heart has regular rate and rhythm. No palpable cervical, supraclavicular, or axillary adenopathy. Abdomen soft, non-tender, normal bowel sounds.    Lab Findings: Lab Results  Component Value Date   WBC 4.8 07/06/2021   HGB 11.7 (L) 07/06/2021   HCT 35.5 (L) 07/06/2021   MCV 94.7 07/06/2021   PLT 225 07/06/2021    Radiographic Findings: CT Chest W Contrast  Result Date: 07/04/2021 CLINICAL DATA:  76 year old female with history of non-small cell lung cancer. Chemotherapy and radiation therapy ongoing. Staging examination. EXAM: CT CHEST, ABDOMEN, AND PELVIS WITH CONTRAST TECHNIQUE: Multidetector CT imaging of the chest, abdomen and pelvis was performed following the standard protocol during bolus administration of  intravenous contrast. RADIATION DOSE REDUCTION: This exam was performed according to the departmental dose-optimization program which includes automated exposure control, adjustment of the mA and/or kV according to patient size and/or use of iterative reconstruction technique. CONTRAST:  66mL OMNIPAQUE IOHEXOL 300 MG/ML  SOLN COMPARISON:  CT the chest, abdomen and pelvis 04/29/2021. FINDINGS: CT CHEST FINDINGS Cardiovascular: Heart size is normal. There is no significant pericardial fluid, thickening or pericardial calcification. There is aortic atherosclerosis, as well as atherosclerosis of the great vessels of the mediastinum and the coronary arteries, including calcified atherosclerotic plaque in the left main, left anterior descending, left circumflex and right coronary arteries. Calcifications of the aortic valve and mitral annulus. Ectasia of the ascending thoracic aorta (4.2 cm in diameter). Mediastinum/Nodes: No pathologically enlarged  mediastinal or hilar lymph nodes. Hilar esophagus is unremarkable in appearance. No axillary lymphadenopathy. Lungs/Pleura: Cavitary mass in the right upper lobe is similar to the prior study measuring 3.7 x 6.5 cm on today's examination (axial image 37 of series 6). Patchy areas of ground-glass attenuation, septal thickening and regional architectural distortion are noted in the periphery of the lateral segment of the right middle lobe, likely to reflect evolving postradiation changes. 1.3 x 0.9 cm ground-glass attenuation nodule in the lateral aspect of the left upper lobe (axial image 93 of series 6), stable in retrospect compared to the prior study, and nonspecific. No pleural effusions. Musculoskeletal: Chronic compression fractures of T4, T8 and T12 are again noted, most severe at T12 where there is 80% loss of anterior vertebral body height. Large lucent areas in T8 and T12 are again noted, suggesting that these are likely pathologic compression fractures. Multiple  other mixed lytic and sclerotic lesions are noted elsewhere in the visualized axial and appendicular skeleton, indicative of widespread metastatic disease to the bones, most notable for a destructive lesion involving the anterolateral aspect of the right third rib. CT ABDOMEN PELVIS FINDINGS Hepatobiliary: Treated lesion in segment 7 of the liver currently measures 1.6 x 1.5 cm (axial image 55 of series 2), stable. No other new suspicious appearing hepatic lesions are noted. No intra or extrahepatic biliary ductal dilatation. Gallbladder is normal in appearance. Pancreas: No pancreatic mass. No pancreatic ductal dilatation. No pancreatic or peripancreatic fluid collections or inflammatory changes. Spleen: Unremarkable. Adrenals/Urinary Tract: 2.7 x 2.2 cm right adrenal nodule, similar to the prior examination. Bilateral kidneys and left adrenal gland are normal in appearance. No hydroureteronephrosis. Urinary bladder is normal in appearance. Stomach/Bowel: The appearance of the stomach is normal. There is no pathologic dilatation of small bowel or colon. The appendix is not confidently identified and may be surgically absent. Regardless, there are no inflammatory changes noted adjacent to the cecum to suggest the presence of an acute appendicitis at this time. Vascular/Lymphatic: Aortic atherosclerosis, without evidence of aneurysm or dissection in the abdominal or pelvic vasculature. No lymphadenopathy noted in the abdomen or pelvis. Reproductive: Uterus and ovaries are unremarkable in appearance. Other: No significant volume of ascites.  No pneumoperitoneum. Musculoskeletal: Multiple lucent areas are again noted in the visualized portions of the proximal femurs bilaterally, suggesting widespread metastatic disease to the bones. A large lytic lesion is also noted in the left ilium just lateral to the left sacroiliac joint. IMPRESSION: 1. Overall, today's examination demonstrates little change compared to the prior  study, with a large partially cavitary right upper lobe mass, metastatic lesions in the right lobe of the liver and right adrenal gland which are essentially stable compared to the prior examination, and multiple osseous metastasis also very similar to the prior study, as detailed above. No new signs of metastatic disease noted elsewhere in the chest, abdomen or pelvis. 2. Aortic atherosclerosis, in addition to left main and three-vessel coronary artery disease. Assessment for potential risk factor modification, dietary therapy or pharmacologic therapy may be warranted, if clinically indicated. 3. Ectasia of ascending thoracic aorta (4.2 cm in diameter). Recommend annual imaging followup by CTA or MRA. This recommendation follows 2010 ACCF/AHA/AATS/ACR/ASA/SCA/SCAI/SIR/STS/SVM Guidelines for the Diagnosis and Management of Patients with Thoracic Aortic Disease. Circulation. 2010; 121: O160-V371. Aortic aneurysm NOS (ICD10-I71.9). 4. There are calcifications of the aortic valve and mitral annulus. Echocardiographic correlation for evaluation of potential valvular dysfunction may be warranted if clinically indicated. 5. Additional incidental findings, as above.  Electronically Signed   By: Vinnie Langton M.D.   On: 07/04/2021 15:09   CT Abdomen Pelvis W Contrast  Result Date: 07/04/2021 CLINICAL DATA:  76 year old female with history of non-small cell lung cancer. Chemotherapy and radiation therapy ongoing. Staging examination. EXAM: CT CHEST, ABDOMEN, AND PELVIS WITH CONTRAST TECHNIQUE: Multidetector CT imaging of the chest, abdomen and pelvis was performed following the standard protocol during bolus administration of intravenous contrast. RADIATION DOSE REDUCTION: This exam was performed according to the departmental dose-optimization program which includes automated exposure control, adjustment of the mA and/or kV according to patient size and/or use of iterative reconstruction technique. CONTRAST:  76mL  OMNIPAQUE IOHEXOL 300 MG/ML  SOLN COMPARISON:  CT the chest, abdomen and pelvis 04/29/2021. FINDINGS: CT CHEST FINDINGS Cardiovascular: Heart size is normal. There is no significant pericardial fluid, thickening or pericardial calcification. There is aortic atherosclerosis, as well as atherosclerosis of the great vessels of the mediastinum and the coronary arteries, including calcified atherosclerotic plaque in the left main, left anterior descending, left circumflex and right coronary arteries. Calcifications of the aortic valve and mitral annulus. Ectasia of the ascending thoracic aorta (4.2 cm in diameter). Mediastinum/Nodes: No pathologically enlarged mediastinal or hilar lymph nodes. Hilar esophagus is unremarkable in appearance. No axillary lymphadenopathy. Lungs/Pleura: Cavitary mass in the right upper lobe is similar to the prior study measuring 3.7 x 6.5 cm on today's examination (axial image 37 of series 6). Patchy areas of ground-glass attenuation, septal thickening and regional architectural distortion are noted in the periphery of the lateral segment of the right middle lobe, likely to reflect evolving postradiation changes. 1.3 x 0.9 cm ground-glass attenuation nodule in the lateral aspect of the left upper lobe (axial image 93 of series 6), stable in retrospect compared to the prior study, and nonspecific. No pleural effusions. Musculoskeletal: Chronic compression fractures of T4, T8 and T12 are again noted, most severe at T12 where there is 80% loss of anterior vertebral body height. Large lucent areas in T8 and T12 are again noted, suggesting that these are likely pathologic compression fractures. Multiple other mixed lytic and sclerotic lesions are noted elsewhere in the visualized axial and appendicular skeleton, indicative of widespread metastatic disease to the bones, most notable for a destructive lesion involving the anterolateral aspect of the right third rib. CT ABDOMEN PELVIS FINDINGS  Hepatobiliary: Treated lesion in segment 7 of the liver currently measures 1.6 x 1.5 cm (axial image 55 of series 2), stable. No other new suspicious appearing hepatic lesions are noted. No intra or extrahepatic biliary ductal dilatation. Gallbladder is normal in appearance. Pancreas: No pancreatic mass. No pancreatic ductal dilatation. No pancreatic or peripancreatic fluid collections or inflammatory changes. Spleen: Unremarkable. Adrenals/Urinary Tract: 2.7 x 2.2 cm right adrenal nodule, similar to the prior examination. Bilateral kidneys and left adrenal gland are normal in appearance. No hydroureteronephrosis. Urinary bladder is normal in appearance. Stomach/Bowel: The appearance of the stomach is normal. There is no pathologic dilatation of small bowel or colon. The appendix is not confidently identified and may be surgically absent. Regardless, there are no inflammatory changes noted adjacent to the cecum to suggest the presence of an acute appendicitis at this time. Vascular/Lymphatic: Aortic atherosclerosis, without evidence of aneurysm or dissection in the abdominal or pelvic vasculature. No lymphadenopathy noted in the abdomen or pelvis. Reproductive: Uterus and ovaries are unremarkable in appearance. Other: No significant volume of ascites.  No pneumoperitoneum. Musculoskeletal: Multiple lucent areas are again noted in the visualized portions of the proximal  femurs bilaterally, suggesting widespread metastatic disease to the bones. A large lytic lesion is also noted in the left ilium just lateral to the left sacroiliac joint. IMPRESSION: 1. Overall, today's examination demonstrates little change compared to the prior study, with a large partially cavitary right upper lobe mass, metastatic lesions in the right lobe of the liver and right adrenal gland which are essentially stable compared to the prior examination, and multiple osseous metastasis also very similar to the prior study, as detailed above. No  new signs of metastatic disease noted elsewhere in the chest, abdomen or pelvis. 2. Aortic atherosclerosis, in addition to left main and three-vessel coronary artery disease. Assessment for potential risk factor modification, dietary therapy or pharmacologic therapy may be warranted, if clinically indicated. 3. Ectasia of ascending thoracic aorta (4.2 cm in diameter). Recommend annual imaging followup by CTA or MRA. This recommendation follows 2010 ACCF/AHA/AATS/ACR/ASA/SCA/SCAI/SIR/STS/SVM Guidelines for the Diagnosis and Management of Patients with Thoracic Aortic Disease. Circulation. 2010; 121: M381-R711. Aortic aneurysm NOS (ICD10-I71.9). 4. There are calcifications of the aortic valve and mitral annulus. Echocardiographic correlation for evaluation of potential valvular dysfunction may be warranted if clinically indicated. 5. Additional incidental findings, as above. Electronically Signed   By: Vinnie Langton M.D.   On: 07/04/2021 15:09    Impression: Stage IV (T3, N2, M1 C) non-small cell lung cancer with unknown histologic subtype.  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  She tolerated her most recent course of palliative radiation therapy well.  Recent imaging shows stability with no progression at any sites.   Plan: As needed follow-up in radiation oncology.  She will continue on Alimta.   _________________________  Blair Promise, PhD, MD   This document serves as a record of services personally performed by Gery Pray, MD. It was created on his behalf by Roney Mans, a trained medical scribe. The creation of this record is based on the scribe's personal observations and the provider's statements to them. This document has been checked and approved by the attending provider.

## 2021-07-21 ENCOUNTER — Encounter: Payer: Self-pay | Admitting: Radiation Oncology

## 2021-07-21 ENCOUNTER — Ambulatory Visit
Admission: RE | Admit: 2021-07-21 | Discharge: 2021-07-21 | Disposition: A | Discharge status: Home / Self Care | Payer: Medicare HMO | Source: Ambulatory Visit | Attending: Radiation Oncology | Admitting: Radiation Oncology

## 2021-07-21 ENCOUNTER — Other Ambulatory Visit: Payer: Self-pay

## 2021-07-21 DIAGNOSIS — I2510 Atherosclerotic heart disease of native coronary artery without angina pectoris: Secondary | ICD-10-CM | POA: Insufficient documentation

## 2021-07-21 DIAGNOSIS — C7951 Secondary malignant neoplasm of bone: Secondary | ICD-10-CM | POA: Diagnosis not present

## 2021-07-21 DIAGNOSIS — Z923 Personal history of irradiation: Secondary | ICD-10-CM | POA: Insufficient documentation

## 2021-07-21 DIAGNOSIS — C3411 Malignant neoplasm of upper lobe, right bronchus or lung: Secondary | ICD-10-CM | POA: Insufficient documentation

## 2021-07-21 DIAGNOSIS — I700 Atherosclerosis of aorta: Secondary | ICD-10-CM | POA: Insufficient documentation

## 2021-07-21 DIAGNOSIS — C3491 Malignant neoplasm of unspecified part of right bronchus or lung: Secondary | ICD-10-CM

## 2021-07-21 DIAGNOSIS — Z791 Long term (current) use of non-steroidal anti-inflammatories (NSAID): Secondary | ICD-10-CM | POA: Diagnosis not present

## 2021-07-21 DIAGNOSIS — C7971 Secondary malignant neoplasm of right adrenal gland: Secondary | ICD-10-CM | POA: Insufficient documentation

## 2021-07-21 DIAGNOSIS — I3481 Nonrheumatic mitral (valve) annulus calcification: Secondary | ICD-10-CM | POA: Insufficient documentation

## 2021-07-21 NOTE — Progress Notes (Signed)
Kristi Jennings is here today for follow up post radiation to the lung. ? ?Lung Side: Right, patient completed treatment on 06/17/21 ? ?Does the patient complain of any of the following: ?Pain: no ?Shortness of breath w/wo exertion: no ?Cough: no ?Hemoptysis: no ?Pain with swallowing: no ?Swallowing/choking concerns: no ?Appetite: Fair, patient reports improvement. Patient drinking premier protein. ?Energy Level: Good ?Post radiation skin Changes: patient reports having rash to right  back, patient was seen by dermatologist yesterday. ? ? ? ?Additional comments if applicable:  ?Vitals:  ? 07/21/21 1001  ?BP: (!) 147/78  ?Pulse: 62  ?Resp: 20  ?Temp: 97.7 ?F (36.5 ?C)  ?TempSrc: Temporal  ?SpO2: 98%  ?Weight: 90 lb 4 oz (40.9 kg)  ?Height: 5\' 5"  (1.651 m)  ?  ?

## 2021-07-22 ENCOUNTER — Other Ambulatory Visit: Payer: Self-pay | Admitting: Nurse Practitioner

## 2021-07-22 DIAGNOSIS — C7951 Secondary malignant neoplasm of bone: Secondary | ICD-10-CM

## 2021-07-22 MED ORDER — MORPHINE SULFATE ER 30 MG PO TBCR: 30.0000 mg | EXTENDED_RELEASE_TABLET | Freq: Two times a day (BID) | ORAL | 0 refills | Status: DC

## 2021-07-25 ENCOUNTER — Other Ambulatory Visit (HOSPITAL_BASED_OUTPATIENT_CLINIC_OR_DEPARTMENT_OTHER): Payer: Self-pay

## 2021-07-25 ENCOUNTER — Telehealth: Payer: Self-pay

## 2021-07-25 ENCOUNTER — Encounter: Payer: Self-pay | Admitting: Physician Assistant

## 2021-07-25 ENCOUNTER — Other Ambulatory Visit: Payer: Self-pay | Admitting: Nurse Practitioner

## 2021-07-25 ENCOUNTER — Encounter: Payer: Self-pay | Admitting: Internal Medicine

## 2021-07-25 DIAGNOSIS — C7951 Secondary malignant neoplasm of bone: Secondary | ICD-10-CM

## 2021-07-25 MED ORDER — MORPHINE SULFATE ER 30 MG PO TBCR
30.0000 mg | EXTENDED_RELEASE_TABLET | Freq: Two times a day (BID) | ORAL | 0 refills | Status: DC
  Filled 2021-07-25: qty 60, 30d supply, fill #0

## 2021-07-25 NOTE — Telephone Encounter (Signed)
New prescription sent in to [facility] as requested. Patient's local pharmacy ([facility]) does not have MS Contin in stock. Reports medication is on back order. Patient and husband is aware and will plan to pick up at [facility].  ?

## 2021-07-25 NOTE — Telephone Encounter (Signed)
Kristi Jennings's pharmacy called to notify our office that they did not have the MS Contin in the 30mg  tablets. I called [facility] in [facility] who stated they did have the MS Contin 30mg .  ?I called Kristi Jennings and he stated that they wouldn't mind [place] sending it to that pharmacy instead. [name], NP notified and medication sent to [facility]. Advised to call our office with any questions/concerns.  ?

## 2021-07-26 ENCOUNTER — Other Ambulatory Visit (HOSPITAL_BASED_OUTPATIENT_CLINIC_OR_DEPARTMENT_OTHER): Payer: Self-pay

## 2021-07-27 ENCOUNTER — Inpatient Hospital Stay: Payer: Medicare HMO | Attending: Internal Medicine

## 2021-07-27 ENCOUNTER — Other Ambulatory Visit: Payer: Self-pay

## 2021-07-27 ENCOUNTER — Encounter: Payer: Self-pay | Admitting: Internal Medicine

## 2021-07-27 ENCOUNTER — Inpatient Hospital Stay: Payer: Medicare HMO

## 2021-07-27 ENCOUNTER — Inpatient Hospital Stay (HOSPITAL_BASED_OUTPATIENT_CLINIC_OR_DEPARTMENT_OTHER): Payer: Medicare HMO | Admitting: Nurse Practitioner

## 2021-07-27 ENCOUNTER — Inpatient Hospital Stay: Payer: Medicare HMO | Admitting: Internal Medicine

## 2021-07-27 ENCOUNTER — Encounter: Payer: Self-pay | Admitting: Nurse Practitioner

## 2021-07-27 VITALS — BP 145/86 | HR 75 | Temp 97.0°F | Resp 17 | Ht 65.0 in | Wt 90.4 lb

## 2021-07-27 DIAGNOSIS — R630 Anorexia: Secondary | ICD-10-CM | POA: Diagnosis not present

## 2021-07-27 DIAGNOSIS — I10 Essential (primary) hypertension: Secondary | ICD-10-CM | POA: Diagnosis not present

## 2021-07-27 DIAGNOSIS — Z5111 Encounter for antineoplastic chemotherapy: Secondary | ICD-10-CM | POA: Insufficient documentation

## 2021-07-27 DIAGNOSIS — C7951 Secondary malignant neoplasm of bone: Secondary | ICD-10-CM

## 2021-07-27 DIAGNOSIS — G893 Neoplasm related pain (acute) (chronic): Secondary | ICD-10-CM | POA: Diagnosis not present

## 2021-07-27 DIAGNOSIS — L299 Pruritus, unspecified: Secondary | ICD-10-CM | POA: Insufficient documentation

## 2021-07-27 DIAGNOSIS — C3491 Malignant neoplasm of unspecified part of right bronchus or lung: Secondary | ICD-10-CM

## 2021-07-27 DIAGNOSIS — C7971 Secondary malignant neoplasm of right adrenal gland: Secondary | ICD-10-CM | POA: Insufficient documentation

## 2021-07-27 DIAGNOSIS — R5383 Other fatigue: Secondary | ICD-10-CM | POA: Insufficient documentation

## 2021-07-27 DIAGNOSIS — C3411 Malignant neoplasm of upper lobe, right bronchus or lung: Secondary | ICD-10-CM | POA: Insufficient documentation

## 2021-07-27 DIAGNOSIS — Z79899 Other long term (current) drug therapy: Secondary | ICD-10-CM | POA: Diagnosis not present

## 2021-07-27 DIAGNOSIS — Z515 Encounter for palliative care: Secondary | ICD-10-CM

## 2021-07-27 LAB — CMP (CANCER CENTER ONLY)
ALT: 11 U/L (ref 0–44)
AST: 17 U/L (ref 15–41)
Albumin: 3.9 g/dL (ref 3.5–5.0)
Alkaline Phosphatase: 61 U/L (ref 38–126)
Anion gap: 5 (ref 5–15)
BUN: 9 mg/dL (ref 8–23)
CO2: 34 mmol/L — ABNORMAL HIGH (ref 22–32)
Calcium: 9.8 mg/dL (ref 8.9–10.3)
Chloride: 99 mmol/L (ref 98–111)
Creatinine: 0.55 mg/dL (ref 0.44–1.00)
GFR, Estimated: >60 mL/min (ref >60)
Glucose, Bld: 102 mg/dL — ABNORMAL HIGH (ref 70–99)
Potassium: 3.6 mmol/L (ref 3.5–5.1)
Sodium: 138 mmol/L (ref 135–145)
Total Bilirubin: 0.4 mg/dL (ref 0.3–1.2)
Total Protein: 6.8 g/dL (ref 6.5–8.1)

## 2021-07-27 LAB — CBC WITH DIFFERENTIAL (CANCER CENTER ONLY)
Abs Immature Granulocytes: 0.01 10*3/uL (ref 0.00–0.07)
Basophils Absolute: 0 10*3/uL (ref 0.0–0.1)
Basophils Relative: 1 %
Eosinophils Absolute: 0.3 10*3/uL (ref 0.0–0.5)
Eosinophils Relative: 7 %
HCT: 36.3 % (ref 36.0–46.0)
Hemoglobin: 11.8 g/dL — ABNORMAL LOW (ref 12.0–15.0)
Immature Granulocytes: 0 %
Lymphocytes Relative: 19 %
Lymphs Abs: 0.9 10*3/uL (ref 0.7–4.0)
MCH: 31.6 pg (ref 26.0–34.0)
MCHC: 32.5 g/dL (ref 30.0–36.0)
MCV: 97.3 fL (ref 80.0–100.0)
Monocytes Absolute: 0.6 10*3/uL (ref 0.1–1.0)
Monocytes Relative: 14 %
Neutro Abs: 2.7 10*3/uL (ref 1.7–7.7)
Neutrophils Relative %: 59 %
Platelet Count: 227 10*3/uL (ref 150–400)
RBC: 3.73 MIL/uL — ABNORMAL LOW (ref 3.87–5.11)
RDW: 18.7 % — ABNORMAL HIGH (ref 11.5–15.5)
WBC Count: 4.6 10*3/uL (ref 4.0–10.5)
nRBC: 0 % (ref 0.0–0.2)

## 2021-07-27 LAB — TSH: TSH: 6.027 u[IU]/mL — ABNORMAL HIGH (ref 0.308–3.960)

## 2021-07-27 MED ORDER — SODIUM CHLORIDE 0.9 % IV SOLN: Freq: Once | INTRAVENOUS | Status: AC

## 2021-07-27 MED ORDER — SODIUM CHLORIDE 0.9 % IV SOLN
500.0000 mg/m2 | Freq: Once | INTRAVENOUS | Status: AC
  Administered 2021-07-27: 700 mg via INTRAVENOUS
  Filled 2021-07-27: qty 20

## 2021-07-27 MED ORDER — ZOLEDRONIC ACID 4 MG/5ML IV CONC
3.5000 mg | Freq: Once | INTRAVENOUS | Status: AC
  Administered 2021-07-27: 3.5 mg via INTRAVENOUS
  Filled 2021-07-27: qty 4.38

## 2021-07-27 NOTE — Patient Instructions (Signed)
[facility]  Discharge Instructions: Thank you for choosing [facility] to provide your oncology and hematology care.   If you have a lab appointment with the [facility], please go directly to the [facility] and check in at the registration area.   Wear comfortable clothing and clothing appropriate for easy access to any Portacath or PICC line.   We strive to give you quality time with your provider. You may need to reschedule your appointment if you arrive late (15 or more minutes).  Arriving late affects you and other patients whose appointments are after yours.  Also, if you miss three or more appointments without notifying the office, you may be dismissed from the clinic at the providers discretion.      For prescription refill requests, have your pharmacy contact our office and allow 72 hours for refills to be completed.    Today you received the following chemotherapy and/or immunotherapy agents Alimta (also Zometa)      To help prevent nausea and vomiting after your treatment, we encourage you to take your nausea medication as directed.  BELOW ARE SYMPTOMS THAT SHOULD BE REPORTED IMMEDIATELY: *FEVER GREATER THAN 100.4 F (38 C) OR HIGHER *CHILLS OR SWEATING *NAUSEA AND VOMITING THAT IS NOT CONTROLLED WITH YOUR NAUSEA MEDICATION *UNUSUAL SHORTNESS OF BREATH *UNUSUAL BRUISING OR BLEEDING *URINARY PROBLEMS (pain or burning when urinating, or frequent urination) *BOWEL PROBLEMS (unusual diarrhea, constipation, pain near the anus) TENDERNESS IN MOUTH AND THROAT WITH OR WITHOUT PRESENCE OF ULCERS (sore throat, sores in mouth, or a toothache) UNUSUAL RASH, SWELLING OR PAIN  UNUSUAL VAGINAL DISCHARGE OR ITCHING   Items with * indicate a potential emergency and should be followed up as soon as possible or go to the Emergency Department if any problems should occur.  Please show the CHEMOTHERAPY ALERT CARD or IMMUNOTHERAPY ALERT CARD at  check-in to the Emergency Department and triage nurse.  Should you have questions after your visit or need to cancel or reschedule your appointment, please contact [facility]  Dept: [phone]  and follow the prompts.  Office hours are 8:00 a.m. to 4:30 p.m. Monday - Friday. Please note that voicemails left after 4:00 p.m. may not be returned until the following business day.  We are closed weekends and major holidays. You have access to a nurse at all times for urgent questions. Please call the main number to the clinic Dept: [phone] and follow the prompts.   For any non-urgent questions, you may also contact your provider using MyChart. We now offer e-Visits for anyone 75 and older to request care online for non-urgent symptoms. For details visit [email or web address].   Also download the MyChart app! Go to the app store, search "MyChart", open the app, select [facility], and log in with your MyChart username and password.  Due to Covid, a mask is required upon entering the hospital/clinic. If you do not have a mask, one will be given to you upon arrival. For doctor visits, patients may have 1 support person aged 75 or older with them. For treatment visits, patients cannot have anyone with them due to current Covid guidelines and our immunocompromised population.   Pemetrexed injection What is this medication? PEMETREXED (PEM e TREX ed) is a chemotherapy drug used to treat lung cancers like non-small cell lung cancer and mesothelioma. It may also be used to treat other cancers. This medicine may be used for other purposes; ask your health  care provider or pharmacist if you have questions. COMMON BRAND NAME(S): Alimta, PEMFEXY What should I tell my care team before I take this medication? They need to know if you have any of these conditions: infection (especially a virus infection such as chickenpox, cold sores, or herpes) kidney disease low blood  counts, like low white cell, platelet, or red cell counts lung or breathing disease, like asthma radiation therapy an unusual or allergic reaction to pemetrexed, other medicines, foods, dyes, or preservative pregnant or trying to get pregnant breast-feeding How should I use this medication? This drug is given as an infusion into a vein. It is administered in a hospital or clinic by a specially trained health care professional. Talk to your pediatrician regarding the use of this medicine in children. Special care may be needed. Overdosage: If you think you have taken too much of this medicine contact a poison control center or emergency room at once. NOTE: This medicine is only for you. Do not share this medicine with others. What if I miss a dose? It is important not to miss your dose. Call your doctor or health care professional if you are unable to keep an appointment. What may interact with this medication? This medicine may interact with the following medications: Ibuprofen This list may not describe all possible interactions. Give your health care provider a list of all the medicines, herbs, non-prescription drugs, or dietary supplements you use. Also tell them if you smoke, drink alcohol, or use illegal drugs. Some items may interact with your medicine. What should I watch for while using this medication? Visit your doctor for checks on your progress. This drug may make you feel generally unwell. This is not uncommon, as chemotherapy can affect healthy cells as well as cancer cells. Report any side effects. Continue your course of treatment even though you feel ill unless your doctor tells you to stop. In some cases, you may be given additional medicines to help with side effects. Follow all directions for their use. Call your doctor or health care professional for advice if you get a fever, chills or sore throat, or other symptoms of a cold or flu. Do not treat yourself. This drug  decreases your body's ability to fight infections. Try to avoid being around people who are sick. This medicine may increase your risk to bruise or bleed. Call your doctor or health care professional if you notice any unusual bleeding. Be careful brushing and flossing your teeth or using a toothpick because you may get an infection or bleed more easily. If you have any dental work done, tell your dentist you are receiving this medicine. Avoid taking products that contain aspirin, acetaminophen, ibuprofen, naproxen, or ketoprofen unless instructed by your doctor. These medicines may hide a fever. Call your doctor or health care professional if you get diarrhea or mouth sores. Do not treat yourself. To protect your kidneys, drink water or other fluids as directed while you are taking this medicine. Do not become pregnant while taking this medicine or for 6 months after stopping it. Women should inform their doctor if they wish to become pregnant or think they might be pregnant. Men should not father a child while taking this medicine and for 3 months after stopping it. This may interfere with the ability to father a child. You should talk to your doctor or health care professional if you are concerned about your fertility. There is a potential for serious side effects to an unborn child. Talk  to your health care professional or pharmacist for more information. Do not breast-feed an infant while taking this medicine or for 1 week after stopping it. What side effects may I notice from receiving this medication? Side effects that you should report to your doctor or health care professional as soon as possible: allergic reactions like skin rash, itching or hives, swelling of the face, lips, or tongue breathing problems redness, blistering, peeling or loosening of the skin, including inside the mouth signs and symptoms of bleeding such as bloody or black, tarry stools; red or dark-brown urine; spitting up blood  or brown material that looks like coffee grounds; red spots on the skin; unusual bruising or bleeding from the eye, gums, or nose signs and symptoms of infection like fever or chills; cough; sore throat; pain or trouble passing urine signs and symptoms of kidney injury like trouble passing urine or change in the amount of urine signs and symptoms of liver injury like dark yellow or brown urine; general ill feeling or flu-like symptoms; light-colored stools; loss of appetite; nausea; right upper belly pain; unusually weak or tired; yellowing of the eyes or skin Side effects that usually do not require medical attention (report to your doctor or health care professional if they continue or are bothersome): constipation mouth sores nausea, vomiting unusually weak or tired This list may not describe all possible side effects. Call your doctor for medical advice about side effects. You may report side effects to FDA at 1-800-FDA-1088. Where should I keep my medication? This drug is given in a hospital or clinic and will not be stored at home. NOTE: This sheet is a summary. It may not cover all possible information. If you have questions about this medicine, talk to your doctor, pharmacist, or health care provider.  2022 [employer]/Gold Standard (2017-07-03 00:00:00)  Zoledronic Acid Injection (Hypercalcemia, Oncology) What is this medication? ZOLEDRONIC ACID (ZOE le dron ik AS id) slows calcium loss from bones. It high calcium levels in the blood from some kinds of cancer. It may be used in other people at risk for bone loss. This medicine may be used for other purposes; ask your health care provider or pharmacist if you have questions. COMMON BRAND NAME(S): Zometa What should I tell my care team before I take this medication? They need to know if you have any of these conditions: cancer dehydration dental disease kidney disease liver disease low levels of calcium in the blood lung or breathing  disease (asthma) receiving steroids like dexamethasone or prednisone an unusual or allergic reaction to zoledronic acid, other medicines, foods, dyes, or preservatives pregnant or trying to get pregnant breast-feeding How should I use this medication? This drug is injected into a vein. It is given by a health care provider in a hospital or clinic setting. Talk to your health care provider about the use of this drug in children. Special care may be needed. Overdosage: If you think you have taken too much of this medicine contact a poison control center or emergency room at once. NOTE: This medicine is only for you. Do not share this medicine with others. What if I miss a dose? Keep appointments for follow-up doses. It is important not to miss your dose. Call your health care provider if you are unable to keep an appointment. What may interact with this medication? certain antibiotics given by injection NSAIDs, medicines for pain and inflammation, like ibuprofen or naproxen some diuretics like bumetanide, furosemide teriparatide thalidomide This list may not  describe all possible interactions. Give your health care provider a list of all the medicines, herbs, non-prescription drugs, or dietary supplements you use. Also tell them if you smoke, drink alcohol, or use illegal drugs. Some items may interact with your medicine. What should I watch for while using this medication? Visit your health care provider for regular checks on your progress. It may be some time before you see the benefit from this drug. Some people who take this drug have severe bone, joint, or muscle pain. This drug may also increase your risk for jaw problems or a broken thigh bone. Tell your health care provider right away if you have severe pain in your jaw, bones, joints, or muscles. Tell you health care provider if you have any pain that does not go away or that gets worse. Tell your dentist and dental surgeon that you are  taking this drug. You should not have major dental surgery while on this drug. See your dentist to have a dental exam and fix any dental problems before starting this drug. Take good care of your teeth while on this drug. Make sure you see your dentist for regular follow-up appointments. You should make sure you get enough calcium and vitamin D while you are taking this drug. Discuss the foods you eat and the vitamins you take with your health care provider. Check with your health care provider if you have severe diarrhea, nausea, and vomiting, or if you sweat a lot. The loss of too much body fluid may make it dangerous for you to take this drug. You may need blood work done while you are taking this drug. Do not become pregnant while taking this drug. Women should inform their health care provider if they wish to become pregnant or think they might be pregnant. There is potential for serious harm to an unborn child. Talk to your health care provider for more information. What side effects may I notice from receiving this medication? Side effects that you should report to your doctor or health care provider as soon as possible: allergic reactions (skin rash, itching or hives; swelling of the face, lips, or tongue) bone pain infection (fever, chills, cough, sore throat, pain or trouble passing urine) jaw pain, especially after dental work joint pain kidney injury (trouble passing urine or change in the amount of urine) low blood pressure (dizziness; feeling faint or lightheaded, falls; unusually weak or tired) low calcium levels (fast heartbeat; muscle cramps or pain; pain, tingling, or numbness in the hands or feet; seizures) low magnesium levels (fast, irregular heartbeat; muscle cramp or pain; muscle weakness; tremors; seizures) low red blood cell counts (trouble breathing; feeling faint; lightheaded, falls; unusually weak or tired) muscle pain redness, blistering, peeling, or loosening of the  skin, including inside the mouth severe diarrhea swelling of the ankles, feet, hands trouble breathing Side effects that usually do not require medical attention (report to your doctor or health care provider if they continue or are bothersome): anxious constipation coughing depressed mood eye irritation, itching, or pain fever general ill feeling or flu-like symptoms nausea pain, redness, or irritation at site where injected trouble sleeping This list may not describe all possible side effects. Call your doctor for medical advice about side effects. You may report side effects to FDA at 1-800-FDA-1088. Where should I keep my medication? This drug is given in a hospital or clinic. It will not be stored at home. NOTE: This sheet is a summary. It may not cover all  possible information. If you have questions about this medicine, talk to your doctor, pharmacist, or health care provider.  2022 [employer]/Gold Standard (2021-01-25 00:00:00)

## 2021-07-27 NOTE — Progress Notes (Signed)
[facility] Telephone:([phone]) [phone]   Fax:([phone]  OFFICE PROGRESS NOTE  [name], NP [address] [place] [address]  DIAGNOSIS: Stage IV (T3, N2, M1 C) non-small cell lung cancer with unknown histologic subtype.  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   DETECTED ALTERATION(S) / BIOMARKER(S)      % CFDNA OR AMPLIFICATION        ASSOCIATED FDA-APPROVED THERAPIES         CLINICAL TRIAL AVAILABILITY [id number] 8.9%   Sotorasib Yes [id number] 7.2% None     Yes     PRIOR THERAPY:  Palliative radiotherapy to the painful bone lesions under the care of Dr. Sondra Come.   CURRENT THERAPY:  1) Palliative systemic chemotherapy with carboplatin for AUC of 5, Alimta 500 Mg/M2 and Keytruda 200 Mg IV every 3 weeks.  First dose of treatment on December 29, 2020. Status post 10  cycles.  Starting from cycle #5, the patient is going to start maintenance Alimta and Keytruda. Keytruda discontinued from cycle #7 due to myalgias.  2)Zometa every 6 weeks starting on 02/10/21 3) Possible palliative radiation to the enlarging pulmonary mass under the care of Dr. Sondra Come.  INTERVAL HISTORY: Kristi Jennings 76 y.o. female returns to the clinic today for follow-up visit accompanied by her husband.  The patient is feeling fine with no concerning complaints except for the itching especially in the scalp and back.  She is followed by several physician including a dermatologist, her primary care physician and palliative care team and also her rheumatologist Dr. Lenna Gilford who is prescribing prednisone for the patient.  She is currently on a taper dose of prednisone 9 mg p.o. daily.  She denied having any current chest pain, shortness of breath with mild cough and no hemoptysis.  She denied having any recent weight  loss or night sweats.  She actually gained 5 pounds since her last visit.  She is here today for evaluation before starting cycle #11.  MEDICAL HISTORY: Past Medical History:  Diagnosis Date   Borderline diabetes mellitus    Bronchogenic cancer of right lung ([place])    Dyslipidemia    History of radiation therapy    right chest 12/21/2020-12/24/2020  Dr Gery Pray   History of radiation therapy    right lung 06/07/2021-06/17/2021  Dr Gery Pray   Hypertension    Polymyalgia rheumatica ([place])     ALLERGIES:  is allergic to shellfish-derived products and augmentin [amoxicillin-pot clavulanate].  MEDICATIONS:  Current Outpatient Medications  Medication Sig Dispense Refill   acetaminophen (TYLENOL) 650 MG CR tablet Take 1,300 mg by mouth 3 (three) times daily as needed for pain.     aspirin EC 81 MG tablet Take 81 mg by mouth 4 (four) times a week. Swallow whole.     atenolol (TENORMIN) 25 MG tablet Take 12.5 mg by mouth at bedtime.     Calcium-Vitamin D-Vitamin K (CALCIUM + D) [phone] MG-UNT-MCG CHEW      Cholecalciferol (VITAMIN D) 50 MCG (2000 UT) tablet      diclofenac Sodium (VOLTAREN) 1 % GEL Apply 4 g topically 4 (four) times daily. (Patient taking differently: Apply 4 g topically 4 (four) times daily as needed (pain).) 150 g 0   dronabinol (MARINOL) 5 MG capsule Take 1 capsule (  5 mg total) by mouth 2 (two) times daily before a meal. 30 capsule 1   enoxaparin (LOVENOX) 60 MG/0.6ML injection Inject 0.6 mLs (60 mg total) into the skin daily. 18 mL 2   escitalopram (LEXAPRO) 20 MG tablet Take 20 mg by mouth at bedtime.     folic acid (FOLVITE) 1 MG tablet Take 1 tablet (1 mg total) by mouth daily. 30 tablet 2   HYDROmorphone (DILAUDID) 4 MG tablet Take 1 tablet (4 mg total) by mouth every 6 (six) hours as needed for severe pain or moderate pain. 120 tablet 0   hydrOXYzine (VISTARIL) 25 MG capsule Take 1 capsule (25 mg total) by mouth every 8 (eight) hours as needed. 30 capsule 3    methylPREDNISolone (MEDROL DOSEPAK) 4 MG TBPK tablet Use as instructed (Patient not taking: Reported on 07/21/2021) 21 tablet 0   morphine (MS CONTIN) 30 MG 12 hr tablet Take 1 tablet (30 mg total) by mouth every 12 (twelve) hours. 60 tablet 0   Multiple Vitamin (MULTI VITAMIN) TABS      ondansetron (ZOFRAN-ODT) 4 MG disintegrating tablet Take 4 mg by mouth 2 (two) times daily as needed for nausea or vomiting. (Patient not taking: Reported on 07/21/2021)     predniSONE (DELTASONE) 5 MG tablet Take 5 mg by mouth daily.     prochlorperazine (COMPAZINE) 10 MG tablet Take 1 tablet (10 mg total) by mouth every 6 (six) hours as needed for nausea or vomiting. (Patient not taking: Reported on 07/21/2021) 30 tablet 0   rosuvastatin (CRESTOR) 10 MG tablet Take 10 mg by mouth at bedtime. (Patient not taking: Reported on 07/21/2021)     triamcinolone ointment (KENALOG) 0.1 % Apply 1 application. topically 2 (two) times daily.     No current facility-administered medications for this visit.    SURGICAL HISTORY: History reviewed. No pertinent surgical history.  REVIEW OF SYSTEMS:  A comprehensive review of systems was negative except for: Constitutional: positive for fatigue Respiratory: positive for cough Integument/breast: positive for pruritus   PHYSICAL EXAMINATION: General appearance: alert, cooperative, fatigued, and no distress Head: Normocephalic, without obvious abnormality, atraumatic Neck: no adenopathy, no JVD, supple, symmetrical, trachea midline, and thyroid not enlarged, symmetric, no tenderness/mass/nodules Lymph nodes: Cervical, supraclavicular, and axillary nodes normal. Resp: clear to auscultation bilaterally Back: symmetric, no curvature. ROM normal. No CVA tenderness. Cardio: regular rate and rhythm, S1, S2 normal, no murmur, click, rub or gallop GI: soft, non-tender; bowel sounds normal; no masses,  no organomegaly Extremities: extremities normal, atraumatic, no cyanosis or edema  ECOG  PERFORMANCE STATUS: 1 - Symptomatic but completely ambulatory  Blood pressure (!) 145/86, pulse 75, temperature (!) 97 F (36.1 C), temperature source Tympanic, resp. rate 17, height 5\' 5"  (1.651 m), weight 90 lb 6.4 oz (41 kg), SpO2 99 %.  LABORATORY DATA: Lab Results  Component Value Date   WBC 4.6 07/27/2021   HGB 11.8 (L) 07/27/2021   HCT 36.3 07/27/2021   MCV 97.3 07/27/2021   PLT 227 07/27/2021      Chemistry      Component Value Date/Time   NA 138 07/27/2021 0940   K 3.6 07/27/2021 0940   CL 99 07/27/2021 0940   CO2 34 (H) 07/27/2021 0940   BUN 9 07/27/2021 0940   CREATININE 0.55 07/27/2021 0940      Component Value Date/Time   CALCIUM 9.8 07/27/2021 0940   ALKPHOS 61 07/27/2021 0940   AST 17 07/27/2021 0940   ALT 11 07/27/2021 0940  BILITOT 0.4 07/27/2021 0940       RADIOGRAPHIC STUDIES: CT Chest W Contrast  Result Date: 07/04/2021 CLINICAL DATA:  76 year old female with history of non-small cell lung cancer. Chemotherapy and radiation therapy ongoing. Staging examination. EXAM: CT CHEST, ABDOMEN, AND PELVIS WITH CONTRAST TECHNIQUE: Multidetector CT imaging of the chest, abdomen and pelvis was performed following the standard protocol during bolus administration of intravenous contrast. RADIATION DOSE REDUCTION: This exam was performed according to the departmental dose-optimization program which includes automated exposure control, adjustment of the mA and/or kV according to patient size and/or use of iterative reconstruction technique. CONTRAST:  18mL OMNIPAQUE IOHEXOL 300 MG/ML  SOLN COMPARISON:  CT the chest, abdomen and pelvis 04/29/2021. FINDINGS: CT CHEST FINDINGS Cardiovascular: Heart size is normal. There is no significant pericardial fluid, thickening or pericardial calcification. There is aortic atherosclerosis, as well as atherosclerosis of the great vessels of the mediastinum and the coronary arteries, including calcified atherosclerotic plaque in the left  main, left anterior descending, left circumflex and right coronary arteries. Calcifications of the aortic valve and mitral annulus. Ectasia of the ascending thoracic aorta (4.2 cm in diameter). Mediastinum/Nodes: No pathologically enlarged mediastinal or hilar lymph nodes. Hilar esophagus is unremarkable in appearance. No axillary lymphadenopathy. Lungs/Pleura: Cavitary mass in the right upper lobe is similar to the prior study measuring 3.7 x 6.5 cm on today's examination (axial image 37 of series 6). Patchy areas of ground-glass attenuation, septal thickening and regional architectural distortion are noted in the periphery of the lateral segment of the right middle lobe, likely to reflect evolving postradiation changes. 1.3 x 0.9 cm ground-glass attenuation nodule in the lateral aspect of the left upper lobe (axial image 93 of series 6), stable in retrospect compared to the prior study, and nonspecific. No pleural effusions. Musculoskeletal: Chronic compression fractures of T4, T8 and T12 are again noted, most severe at T12 where there is 80% loss of anterior vertebral body height. Large lucent areas in T8 and T12 are again noted, suggesting that these are likely pathologic compression fractures. Multiple other mixed lytic and sclerotic lesions are noted elsewhere in the visualized axial and appendicular skeleton, indicative of widespread metastatic disease to the bones, most notable for a destructive lesion involving the anterolateral aspect of the right third rib. CT ABDOMEN PELVIS FINDINGS Hepatobiliary: Treated lesion in segment 7 of the liver currently measures 1.6 x 1.5 cm (axial image 55 of series 2), stable. No other new suspicious appearing hepatic lesions are noted. No intra or extrahepatic biliary ductal dilatation. Gallbladder is normal in appearance. Pancreas: No pancreatic mass. No pancreatic ductal dilatation. No pancreatic or peripancreatic fluid collections or inflammatory changes. Spleen:  Unremarkable. Adrenals/Urinary Tract: 2.7 x 2.2 cm right adrenal nodule, similar to the prior examination. Bilateral kidneys and left adrenal gland are normal in appearance. No hydroureteronephrosis. Urinary bladder is normal in appearance. Stomach/Bowel: The appearance of the stomach is normal. There is no pathologic dilatation of small bowel or colon. The appendix is not confidently identified and may be surgically absent. Regardless, there are no inflammatory changes noted adjacent to the cecum to suggest the presence of an acute appendicitis at this time. Vascular/Lymphatic: Aortic atherosclerosis, without evidence of aneurysm or dissection in the abdominal or pelvic vasculature. No lymphadenopathy noted in the abdomen or pelvis. Reproductive: Uterus and ovaries are unremarkable in appearance. Other: No significant volume of ascites.  No pneumoperitoneum. Musculoskeletal: Multiple lucent areas are again noted in the visualized portions of the proximal femurs bilaterally, suggesting widespread metastatic  disease to the bones. A large lytic lesion is also noted in the left ilium just lateral to the left sacroiliac joint. IMPRESSION: 1. Overall, today's examination demonstrates little change compared to the prior study, with a large partially cavitary right upper lobe mass, metastatic lesions in the right lobe of the liver and right adrenal gland which are essentially stable compared to the prior examination, and multiple osseous metastasis also very similar to the prior study, as detailed above. No new signs of metastatic disease noted elsewhere in the chest, abdomen or pelvis. 2. Aortic atherosclerosis, in addition to left main and three-vessel coronary artery disease. Assessment for potential risk factor modification, dietary therapy or pharmacologic therapy may be warranted, if clinically indicated. 3. Ectasia of ascending thoracic aorta (4.2 cm in diameter). Recommend annual imaging followup by CTA or MRA.  This recommendation follows 2010 ACCF/AHA/AATS/ACR/ASA/SCA/SCAI/SIR/STS/SVM Guidelines for the Diagnosis and Management of Patients with Thoracic Aortic Disease. Circulation. 2010; 121: Y099-I338. Aortic aneurysm NOS (ICD10-I71.9). 4. There are calcifications of the aortic valve and mitral annulus. Echocardiographic correlation for evaluation of potential valvular dysfunction may be warranted if clinically indicated. 5. Additional incidental findings, as above. Electronically Signed   By: Vinnie Langton M.D.   On: 07/04/2021 15:09   CT Abdomen Pelvis W Contrast  Result Date: 07/04/2021 CLINICAL DATA:  76 year old female with history of non-small cell lung cancer. Chemotherapy and radiation therapy ongoing. Staging examination. EXAM: CT CHEST, ABDOMEN, AND PELVIS WITH CONTRAST TECHNIQUE: Multidetector CT imaging of the chest, abdomen and pelvis was performed following the standard protocol during bolus administration of intravenous contrast. RADIATION DOSE REDUCTION: This exam was performed according to the departmental dose-optimization program which includes automated exposure control, adjustment of the mA and/or kV according to patient size and/or use of iterative reconstruction technique. CONTRAST:  77mL OMNIPAQUE IOHEXOL 300 MG/ML  SOLN COMPARISON:  CT the chest, abdomen and pelvis 04/29/2021. FINDINGS: CT CHEST FINDINGS Cardiovascular: Heart size is normal. There is no significant pericardial fluid, thickening or pericardial calcification. There is aortic atherosclerosis, as well as atherosclerosis of the great vessels of the mediastinum and the coronary arteries, including calcified atherosclerotic plaque in the left main, left anterior descending, left circumflex and right coronary arteries. Calcifications of the aortic valve and mitral annulus. Ectasia of the ascending thoracic aorta (4.2 cm in diameter). Mediastinum/Nodes: No pathologically enlarged mediastinal or hilar lymph nodes. Hilar esophagus is  unremarkable in appearance. No axillary lymphadenopathy. Lungs/Pleura: Cavitary mass in the right upper lobe is similar to the prior study measuring 3.7 x 6.5 cm on today's examination (axial image 37 of series 6). Patchy areas of ground-glass attenuation, septal thickening and regional architectural distortion are noted in the periphery of the lateral segment of the right middle lobe, likely to reflect evolving postradiation changes. 1.3 x 0.9 cm ground-glass attenuation nodule in the lateral aspect of the left upper lobe (axial image 93 of series 6), stable in retrospect compared to the prior study, and nonspecific. No pleural effusions. Musculoskeletal: Chronic compression fractures of T4, T8 and T12 are again noted, most severe at T12 where there is 80% loss of anterior vertebral body height. Large lucent areas in T8 and T12 are again noted, suggesting that these are likely pathologic compression fractures. Multiple other mixed lytic and sclerotic lesions are noted elsewhere in the visualized axial and appendicular skeleton, indicative of widespread metastatic disease to the bones, most notable for a destructive lesion involving the anterolateral aspect of the right third rib. CT ABDOMEN PELVIS FINDINGS Hepatobiliary:  Treated lesion in segment 7 of the liver currently measures 1.6 x 1.5 cm (axial image 55 of series 2), stable. No other new suspicious appearing hepatic lesions are noted. No intra or extrahepatic biliary ductal dilatation. Gallbladder is normal in appearance. Pancreas: No pancreatic mass. No pancreatic ductal dilatation. No pancreatic or peripancreatic fluid collections or inflammatory changes. Spleen: Unremarkable. Adrenals/Urinary Tract: 2.7 x 2.2 cm right adrenal nodule, similar to the prior examination. Bilateral kidneys and left adrenal gland are normal in appearance. No hydroureteronephrosis. Urinary bladder is normal in appearance. Stomach/Bowel: The appearance of the stomach is normal.  There is no pathologic dilatation of small bowel or colon. The appendix is not confidently identified and may be surgically absent. Regardless, there are no inflammatory changes noted adjacent to the cecum to suggest the presence of an acute appendicitis at this time. Vascular/Lymphatic: Aortic atherosclerosis, without evidence of aneurysm or dissection in the abdominal or pelvic vasculature. No lymphadenopathy noted in the abdomen or pelvis. Reproductive: Uterus and ovaries are unremarkable in appearance. Other: No significant volume of ascites.  No pneumoperitoneum. Musculoskeletal: Multiple lucent areas are again noted in the visualized portions of the proximal femurs bilaterally, suggesting widespread metastatic disease to the bones. A large lytic lesion is also noted in the left ilium just lateral to the left sacroiliac joint. IMPRESSION: 1. Overall, today's examination demonstrates little change compared to the prior study, with a large partially cavitary right upper lobe mass, metastatic lesions in the right lobe of the liver and right adrenal gland which are essentially stable compared to the prior examination, and multiple osseous metastasis also very similar to the prior study, as detailed above. No new signs of metastatic disease noted elsewhere in the chest, abdomen or pelvis. 2. Aortic atherosclerosis, in addition to left main and three-vessel coronary artery disease. Assessment for potential risk factor modification, dietary therapy or pharmacologic therapy may be warranted, if clinically indicated. 3. Ectasia of ascending thoracic aorta (4.2 cm in diameter). Recommend annual imaging followup by CTA or MRA. This recommendation follows 2010 ACCF/AHA/AATS/ACR/ASA/SCA/SCAI/SIR/STS/SVM Guidelines for the Diagnosis and Management of Patients with Thoracic Aortic Disease. Circulation. 2010; 121: [id number]. Aortic aneurysm NOS (ICD10-I71.9). 4. There are calcifications of the aortic valve and mitral annulus.  Echocardiographic correlation for evaluation of potential valvular dysfunction may be warranted if clinically indicated. 5. Additional incidental findings, as above. Electronically Signed   By: Vinnie Langton M.D.   On: 07/04/2021 15:09    ASSESSMENT AND PLAN: This is a very pleasant 76 years old white female diagnosed with Stage IV (T3, N2, M1 C) non-small cell lung cancer with unknown histologic subtype, likely adenocarcinoma with positive KRAS G12C mutation.  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  Status post palliative radiotherapy to the painful bone lesions under the care of Dr. Sondra Come.  The patient started induction systemic chemotherapy with carboplatin for AUC of 5, Alimta 500 Mg/M2 and Keytruda 200 Mg IV every 3 weeks and currently on maintenance treatment with single agent Alimta after Keytruda was discontinued starting from cycle #7 because of significant arthralgia and myalgia.  She is status post 10 cycles of total treatment. The patient has been tolerating this treatment well with no concerning adverse effect except for mild fatigue. She is also undergoing palliative radiotherapy to enlarging pulmonary nodule under the care of Dr. Sondra Come. I recommended for her  to proceed with cycle #11 today as planned. For the itching and scalp lesions, she is followed by dermatology as well as her rheumatologist.  She is currently on a tapered dose of prednisone. I will see the patient back for follow-up visit in 3 weeks for evaluation before the next cycle of her treatment. She was advised to call immediately if she has any other concerning symptoms in the interval. The patient voices understanding of current disease status and treatment options and is in agreement with the current care plan.  All questions were answered. The patient knows  to call the clinic with any problems, questions or concerns. We can certainly see the patient much sooner if necessary.  The total time spent in the appointment was 20 minutes.  Disclaimer: This note was dictated with voice recognition software. Similar sounding words can inadvertently be transcribed and may not be corrected upon review.

## 2021-07-27 NOTE — Progress Notes (Signed)
? ?  ?Palliative Medicine ?[place]  ?[phone]) [phone] [phone] ? ? ?Name: Kristi Jennings ?Date: 07/27/2021 ?MRN: [id number]  ?DOB: [birth year] ? ?Patient Care Team: ?[name], NP as PCP - General (Family Medicine) ?[facility] as Registered Nurse (Hospice and Palliative Medicine)  ? ? ?INTERVAL HISTORY: ?Kristi Jennings is a 76 y.o. 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 Palliative ask to see for symptom management. ? ?SOCIAL HISTORY:    ? reports that she quit smoking about 12 years ago. Her smoking use included cigarettes. She has a 43.00 pack-year smoking history. She has never used smokeless tobacco. She reports that she does not currently use alcohol. She reports that she does not use drugs. ? ?ADVANCE DIRECTIVES:  ?None on file  ? ?CODE STATUS:  ? ?PAST MEDICAL HISTORY: ?Past Medical History:  ?Diagnosis Date  ? Borderline diabetes mellitus   ? Bronchogenic cancer of right lung ([place])   ? Dyslipidemia   ? History of radiation therapy   ? right chest 12/21/2020-12/24/2020  Dr Gery Pray  ? History of radiation therapy   ? right lung 06/07/2021-06/17/2021  Dr Gery Pray  ? Hypertension   ? Polymyalgia rheumatica ([place])   ? ?ALLERGIES:  is allergic to shellfish-derived products and augmentin [amoxicillin-pot clavulanate]. ? ?MEDICATIONS:  ?Current Outpatient Medications  ?Medication Sig Dispense Refill  ? acetaminophen (TYLENOL) 650 MG CR tablet Take 1,300 mg by mouth 3 (three) times daily as needed for pain.    ? aspirin EC 81 MG tablet Take 81 mg by mouth 4 (four) times a week. Swallow whole.    ? atenolol (TENORMIN) 25 MG tablet Take 12.5 mg by mouth at bedtime.    ? Calcium-Vitamin D-Vitamin K (CALCIUM + D) [phone] MG-UNT-MCG CHEW     ? Cholecalciferol (VITAMIN D) 50 MCG (2000 UT) tablet     ? diclofenac Sodium (VOLTAREN) 1 % GEL Apply 4 g topically 4  (four) times daily. (Patient taking differently: Apply 4 g topically 4 (four) times daily as needed (pain).) 150 g 0  ? dronabinol (MARINOL) 5 MG capsule Take 1 capsule (5 mg total) by mouth 2 (two) times daily before a meal. 30 capsule 1  ? enoxaparin (LOVENOX) 60 MG/0.6ML injection Inject 0.6 mLs (60 mg total) into the skin daily. 18 mL 2  ? escitalopram (LEXAPRO) 20 MG tablet Take 20 mg by mouth at bedtime.    ? folic acid (FOLVITE) 1 MG tablet Take 1 tablet (1 mg total) by mouth daily. 30 tablet 2  ? HYDROmorphone (DILAUDID) 4 MG tablet Take 1 tablet (4 mg total) by mouth every 6 (six) hours as needed for severe pain or moderate pain. 120 tablet 0  ? hydrOXYzine (VISTARIL) 25 MG capsule Take 1 capsule (25 mg total) by mouth every 8 (eight) hours as needed. 30 capsule 3  ? morphine (MS CONTIN) 30 MG 12 hr tablet Take 1 tablet (30 mg total) by mouth every 12 (twelve) hours. 60 tablet 0  ? Multiple Vitamin (MULTI VITAMIN) TABS     ? ondansetron (ZOFRAN-ODT) 4 MG disintegrating tablet Take 4 mg by mouth 2 (two) times daily as needed for nausea or vomiting. (Patient not taking: Reported on 07/21/2021)    ? predniSONE (DELTASONE) 5 MG tablet Take 5 mg by mouth daily.    ? Salicylic Acid (SCALPICIN EX) Apply 1 application. topically daily as needed.    ?  triamcinolone ointment (KENALOG) 0.1 % Apply 1 application. topically 2 (two) times daily.    ? ?No current facility-administered medications for this visit.  ? ? ?VITAL SIGNS: ?There were no vitals taken for this visit. ?There were no vitals filed for this visit. ?  ?Estimated body mass index is 15.04 kg/m? as calculated from the following: ?  Height as of an earlier encounter on 07/27/21: 5\' 5"  (1.651 m). ?  Weight as of an earlier encounter on 07/27/21: 90 lb 6.4 oz (41 kg). ? ?PERFORMANCE STATUS (ECOG) : 1 - Symptomatic but completely ambulatory ? ?Physical Exam ?General: NAD, ambulatory ?Resp: normal breathing pattern  ?Cardio: RRR ?Skin: thin, dry, scalp dry with some  scratch marks ?Neurological: AAO x4 ? ?IMPRESSION: ? ?[name] and her husband present to the clinic today for follow-up. No acute distress noted. Continues to show improvement in overall functional and performance status. Is scheduled for infusion today. Tolerating well. Shares some ongoing concerns with scalp irritation/itching. On assessment no areas of concern however scalp is dry. She recently saw her Dermatologist and is using a dry scalp shampoo. Advised to follow-up with Dermatologist if problem continues. Discussed methods of washing hair weekly, use of scalp massager, and moisturizing and deep conditioning to provide moisture to scalp area.  ? ?Pain ?[name]'s pain is much improved. She continues to take MS Contin twice daily. Is not having to take oral dilaudid for breakthrough pain. Able to do more around the home and outside of the house. She and husband are appreciative of improved quality of life. No refills due at this time. Husband expressed he will call when needed. If patient continues to do well we can certainly begin to try to wean down her dose monitoring closely for any changes.  ? ?Constipation ?No concerns with constipation.  Continues to take MiraLAX and senna as needed for bowel regimen. ? ?Anxiety ?Her anxiety has decreased significantly mainly due to to her improved wellbeing and decreased pain.  She states she is focusing on continued improvement and hopes things remain stable.  ? ?Decreased appetite/Weight loss ?Appetite is much improved. Encouraged continue with small frequent meals and focusing on high protein intake. Her weight today has increased 90 lbs up from 88lbs 2/15, 89lbs on 1/25 and 92lb on 1/11.  Will continue to monitor. ? ?Overall patient is doing much better and symptom burden has drastically decreased and almost resolved..  We will continue to support and follow as needed. ? ?PLAN: ?She is very much improved. ?Continue MS Contin  every 12 hours (we will plan to  wean over time as tolerated with close monitoring) ?Patient is not requiring breakthrough medication. Much improved. ?Dronabinol 5 mg twice daily for appetite ?Ongoing symptom management (see above) ?I will plan to see patient back in 3-4 weeks in collaboration with her other appointments.  ? ? ?Patient expressed understanding and was in agreement with this plan. She also understands that She can call the clinic at any time with any questions, concerns, or complaints.  ? ?Time Total: 25 min  ? ?Visit consisted of counseling and education dealing with the complex and emotionally intense issues of symptom management and palliative care in the setting of serious and potentially life-threatening illness.Greater than 50%  of this time was spent counseling and coordinating care related to the above assessment and plan. ? ?Alda Lea, AGPCNP-BC  ?[place] ? ? ? ? ?

## 2021-08-02 ENCOUNTER — Telehealth: Payer: Self-pay | Admitting: Medical Oncology

## 2021-08-02 ENCOUNTER — Telehealth: Payer: Self-pay

## 2021-08-02 DIAGNOSIS — L299 Pruritus, unspecified: Secondary | ICD-10-CM

## 2021-08-02 MED ORDER — HYDROXYZINE PAMOATE 25 MG PO CAPS: 25.0000 mg | ORAL_CAPSULE | Freq: Two times a day (BID) | ORAL | 2 refills | Status: DC | PRN

## 2021-08-02 NOTE — Telephone Encounter (Signed)
Spoke with pt's spouse he states that she has been taking 2 hydroxyzine daily for the itching, wants to know if they could get a script that would reflects that.  ?

## 2021-08-02 NOTE — Telephone Encounter (Signed)
It was eczema and they gave her a cream but they still use this also. ? ?

## 2021-08-02 NOTE — Telephone Encounter (Signed)
Pt's Spouse, [name], called stating pt does not have anymore refills on her hydroxyzine. I advised him PCP wrote script for 3 refills, qty 30 each on 07/14/21.  Then he mentioned pt had previously received this medication from another provider in the past.  Please advise.  [name] can be reached at [phone]. ?

## 2021-08-02 NOTE — Telephone Encounter (Signed)
[facility] Will Stann Mainland stated pt wants to stop Lovenox -Injections . They are too expensive and she has little body fat and it is affecting her QOL  ? ?DVT symptoms have resolved . Can DVT be reassessed with [place] and consider going back on xarelto as trial .  ?

## 2021-08-03 NOTE — Telephone Encounter (Signed)
I have LM requesting a return call. ?

## 2021-08-04 NOTE — Telephone Encounter (Signed)
I have spoken with the pt and advised as indicated. Pt expressed understanding of this information and took notes for her husband so he can be brought up to date. ?

## 2021-08-06 DIAGNOSIS — C3491 Malignant neoplasm of unspecified part of right bronchus or lung: Secondary | ICD-10-CM | POA: Diagnosis not present

## 2021-08-12 DIAGNOSIS — C3491 Malignant neoplasm of unspecified part of right bronchus or lung: Secondary | ICD-10-CM | POA: Diagnosis not present

## 2021-08-16 ENCOUNTER — Other Ambulatory Visit: Payer: Self-pay | Admitting: Physician Assistant

## 2021-08-16 DIAGNOSIS — Z5111 Encounter for antineoplastic chemotherapy: Secondary | ICD-10-CM

## 2021-08-16 NOTE — Progress Notes (Signed)
[facility] ?OFFICE PROGRESS NOTE ? ?[name], NP ?[address] ?[place] [address] ? ?DIAGNOSIS: Stage IV (T3, N2, M1 C) non-small cell lung cancer with unknown histologic subtype.  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 ?  ?DETECTED ALTERATION(S) / BIOMARKER(S)      % CFDNA OR AMPLIFICATION        ASSOCIATED FDA-APPROVED THERAPIES         CLINICAL TRIAL AVAILABILITY ?KRASG12C ?8.9% ?  ?Sotorasib ?Yes ?[id number] ?7.2% ?None     ?Yes ? ?PRIOR THERAPY:  ?1) Palliative radiotherapy to the painful bone lesions under the care of Dr. Sondra Come. ?2) palliative radiation to the enlarging pulmonary mass under the care of Dr. Sondra Come. Last dose on 06/17/21 ? ?CURRENT THERAPY: 1) Palliative systemic chemotherapy with carboplatin for AUC of 5, Alimta 500 Mg/M2 and Keytruda 200 Mg IV every 3 weeks.  First dose of treatment on December 29, 2020. Status post 9 cycles.  Starting from cycle #5, the patient is going to start maintenance Alimta and Keytruda. Keytruda discontinued from cycle #7 due to myalgias.  ?2)Zometa every 6 weeks starting on 02/10/21 ? ?INTERVAL HISTORY: ?Kristi Jennings 76 y.o. female returns to the clinic today for a follow-up visit accompanied by her husband.  The patient is feeling fairly well today without any concerning complaints.  She is currently undergoing prednisone treatment for her diffuse myalgias under the care of her rheumatologist for polymyalgia rheumatica.  The patient is also followed closely by palliative care for pain management and decreased appetite and weight loss.  The patient is also followed closely by member the nutritionist team who is scheduled to see the patient in the infusion room today.  The patient takes Marinol for her appetite. She lost 2 lbs but she states he has an appetite and  feels "hunger", she reports she just gets full easily. She states this is improved compared to prior when she did not have an appetite. She is also taking MS Contin for pain control.  She has not needed to take any medications for breakthrough pain in several weeks.  She takes MiraLAX and senna for constipation.  ? ?The patient is currently on Lovenox due to having a lower extremity DVT while on another anticoagulant. They have been giving the lovenox in her thigh since it was irritating her abdomen.  ? ?She denies any fever or chills.  Her breathing is stable without any changes.  She denies any significant dyspnea on exertion, cough, chest pain, or hemoptysis.  Denies any nausea, vomiting, diarrhea, or constipation.  Denies any headache or visual changes.  Denies any recent rashes or skin changes except for eczema for which she has ointment. She also is going to try head and shoulders for her itchy scalp and coconut oil. She is also established with a dermatologist due to a rash on her flank.  She is here today for evaluation and repeat blood work before starting cycle #12 of single agent Alimta.  [name] was discontinued secondary to her myalgias/polymyalgia rheumatica.  ? ?MEDICAL HISTORY: ?Past Medical History:  ?Diagnosis Date  ? Borderline diabetes mellitus   ? Bronchogenic cancer of right lung ([place])   ? Dyslipidemia   ? History of radiation therapy   ? right chest 12/21/2020-12/24/2020  Dr Gery Pray  ? History  of radiation therapy   ? right lung 06/07/2021-06/17/2021  Dr Gery Pray  ? Hypertension   ? Polymyalgia rheumatica ([place])   ? ? ?ALLERGIES:  is allergic to shellfish-derived products and augmentin [amoxicillin-pot clavulanate]. ? ?MEDICATIONS:  ?Current Outpatient Medications  ?Medication Sig Dispense Refill  ? acetaminophen (TYLENOL) 650 MG CR tablet Take 1,300 mg by mouth 3 (three) times daily as needed for pain.    ? aspirin EC 81 MG tablet Take 81 mg by mouth 4 (four) times a week. Swallow whole.     ? atenolol (TENORMIN) 25 MG tablet Take 12.5 mg by mouth at bedtime.    ? Calcium-Vitamin D-Vitamin K (CALCIUM + D) [id number] MG-UNT-MCG CHEW     ? Cholecalciferol (VITAMIN D) 50 MCG (2000 UT) tablet     ? diclofenac Sodium (VOLTAREN) 1 % GEL Apply 4 g topically 4 (four) times daily. (Patient taking differently: Apply 4 g topically 4 (four) times daily as needed (pain).) 150 g 0  ? dronabinol (MARINOL) 5 MG capsule Take 1 capsule (5 mg total) by mouth 2 (two) times daily before a meal. 30 capsule 1  ? enoxaparin (LOVENOX) 60 MG/0.6ML injection Inject 0.6 mLs (60 mg total) into the skin daily. 18 mL 2  ? escitalopram (LEXAPRO) 20 MG tablet Take 20 mg by mouth at bedtime.    ? folic acid (FOLVITE) 1 MG tablet Take 1 tablet (1 mg total) by mouth daily. 30 tablet 2  ? HYDROmorphone (DILAUDID) 4 MG tablet Take 1 tablet (4 mg total) by mouth every 6 (six) hours as needed for severe pain or moderate pain. 120 tablet 0  ? hydrOXYzine (VISTARIL) 25 MG capsule Take 1 capsule (25 mg total) by mouth 2 (two) times daily as needed for itching. 60 capsule 2  ? morphine (MS CONTIN) 30 MG 12 hr tablet Take 1 tablet (30 mg total) by mouth every 12 (twelve) hours. 60 tablet 0  ? Multiple Vitamin (MULTI VITAMIN) TABS     ? ondansetron (ZOFRAN-ODT) 4 MG disintegrating tablet Take 4 mg by mouth 2 (two) times daily as needed for nausea or vomiting. (Patient not taking: Reported on 07/21/2021)    ? predniSONE (DELTASONE) 5 MG tablet Take 5 mg by mouth daily.    ? Salicylic Acid (SCALPICIN EX) Apply 1 application. topically daily as needed.    ? triamcinolone ointment (KENALOG) 0.1 % Apply 1 application. topically 2 (two) times daily.    ? ?No current facility-administered medications for this visit.  ? ? ?SURGICAL HISTORY: No past surgical history on file. ? ?REVIEW OF SYSTEMS:   ?Review of Systems  ?Constitutional: Positive for fatigue following treatment. Positive for weight loss.Negative for chills and fever.  ?HENT: Negative for mouth  sores, nosebleeds, sore throat and trouble swallowing.   ?Eyes: Negative for eye problems and icterus.  ?Respiratory: Negative for cough, shortness of breath, hemoptysis and wheezing.   ?Cardiovascular: Negative for chest pain and leg swelling.  ?Gastrointestinal:  Negative for abdominal pain,constipation, diarrhea, nausea and vomiting.  ?Genitourinary: Negative for bladder incontinence, difficulty urinating, dysuria, frequency and hematuria.   ?Musculoskeletal:  Negative for gait problem, neck pain and neck stiffness.  ?Skin: Positive for rash on right flank  ?Neurological: Negative for dizziness, extremity weakness, gait problem, headaches, light-headedness and seizures.  ?Hematological: Negative for adenopathy. Does not bruise/bleed easily.  ?Psychiatric/Behavioral: Negative for confusion, depression and sleep disturbance. The patient is not nervous/anxious  ? ? ?PHYSICAL EXAMINATION:  ?Blood pressure (!) 153/76, pulse 69, temperature 98.3 ?  F (36.8 ?C), temperature source Temporal, resp. rate 16, height 5\' 5"  (1.651 m), weight 88 lb 8 oz (40.1 kg), SpO2 94 %. ? ?ECOG PERFORMANCE STATUS: 1 ? ?Physical Exam  ?Constitutional: Oriented to person, place, and time and cachetic appearing female, and in no distress.  ?HENT:  ?Head: Normocephalic and atraumatic.  ?Mouth/Throat: Oropharynx is clear and moist. No oropharyngeal exudate.  ?Eyes: Conjunctivae are normal. Right eye exhibits no discharge. Left eye exhibits no discharge. No scleral icterus.  ?Neck: Normal range of motion. Neck supple.  ?Cardiovascular: Normal rate, regular rhythm, normal heart sounds and intact distal pulses.   ?Pulmonary/Chest: Effort normal. Some expiratory wheezing noted bilaterally. No respiratory distress. No wheezes. No rales.  ?Abdominal: Soft. Bowel sounds are normal. Exhibits no distension and no mass. There is no tenderness.  ?Musculoskeletal: Normal range of motion and no edema.  ?Lymphadenopathy:  ?  No cervical adenopathy.   ?Neurological: Alert and oriented to person, place, and time. Exhibits muscle wasting. Coordination normal.  ?Skin: Skin is warm and dry. No rash noted. Not diaphoretic. No erythema. No pallor.  ?Psychiatric:

## 2021-08-17 ENCOUNTER — Encounter: Payer: Self-pay | Admitting: Nurse Practitioner

## 2021-08-17 ENCOUNTER — Inpatient Hospital Stay: Payer: Medicare HMO | Admitting: Physician Assistant

## 2021-08-17 ENCOUNTER — Inpatient Hospital Stay (HOSPITAL_BASED_OUTPATIENT_CLINIC_OR_DEPARTMENT_OTHER): Payer: Medicare HMO | Admitting: Nurse Practitioner

## 2021-08-17 ENCOUNTER — Other Ambulatory Visit (HOSPITAL_BASED_OUTPATIENT_CLINIC_OR_DEPARTMENT_OTHER): Payer: Self-pay

## 2021-08-17 ENCOUNTER — Inpatient Hospital Stay: Payer: Medicare HMO | Admitting: Dietician

## 2021-08-17 ENCOUNTER — Inpatient Hospital Stay: Payer: Medicare HMO

## 2021-08-17 ENCOUNTER — Other Ambulatory Visit: Payer: Self-pay

## 2021-08-17 VITALS — BP 153/76 | HR 69 | Temp 98.3°F | Resp 16 | Ht 65.0 in | Wt 88.5 lb

## 2021-08-17 VITALS — BP 106/58 | HR 62

## 2021-08-17 DIAGNOSIS — Z5111 Encounter for antineoplastic chemotherapy: Secondary | ICD-10-CM

## 2021-08-17 DIAGNOSIS — R69 Illness, unspecified: Secondary | ICD-10-CM | POA: Diagnosis not present

## 2021-08-17 DIAGNOSIS — F419 Anxiety disorder, unspecified: Secondary | ICD-10-CM

## 2021-08-17 DIAGNOSIS — Z515 Encounter for palliative care: Secondary | ICD-10-CM

## 2021-08-17 DIAGNOSIS — C3491 Malignant neoplasm of unspecified part of right bronchus or lung: Secondary | ICD-10-CM

## 2021-08-17 DIAGNOSIS — L299 Pruritus, unspecified: Secondary | ICD-10-CM | POA: Diagnosis not present

## 2021-08-17 DIAGNOSIS — C7951 Secondary malignant neoplasm of bone: Secondary | ICD-10-CM

## 2021-08-17 DIAGNOSIS — R634 Abnormal weight loss: Secondary | ICD-10-CM

## 2021-08-17 DIAGNOSIS — R5383 Other fatigue: Secondary | ICD-10-CM | POA: Diagnosis not present

## 2021-08-17 DIAGNOSIS — I10 Essential (primary) hypertension: Secondary | ICD-10-CM | POA: Diagnosis not present

## 2021-08-17 DIAGNOSIS — R630 Anorexia: Secondary | ICD-10-CM

## 2021-08-17 DIAGNOSIS — C3411 Malignant neoplasm of upper lobe, right bronchus or lung: Secondary | ICD-10-CM | POA: Diagnosis not present

## 2021-08-17 DIAGNOSIS — Z79899 Other long term (current) drug therapy: Secondary | ICD-10-CM | POA: Diagnosis not present

## 2021-08-17 DIAGNOSIS — C7971 Secondary malignant neoplasm of right adrenal gland: Secondary | ICD-10-CM | POA: Diagnosis not present

## 2021-08-17 LAB — CMP (CANCER CENTER ONLY)
ALT: 11 U/L (ref 0–44)
AST: 19 U/L (ref 15–41)
Albumin: 3.9 g/dL (ref 3.5–5.0)
Alkaline Phosphatase: 56 U/L (ref 38–126)
Anion gap: 5 (ref 5–15)
BUN: 13 mg/dL (ref 8–23)
CO2: 34 mmol/L — ABNORMAL HIGH (ref 22–32)
Calcium: 9.4 mg/dL (ref 8.9–10.3)
Chloride: 98 mmol/L (ref 98–111)
Creatinine: 0.61 mg/dL (ref 0.44–1.00)
GFR, Estimated: >60 mL/min (ref >60)
Glucose, Bld: 90 mg/dL (ref 70–99)
Potassium: 4.2 mmol/L (ref 3.5–5.1)
Sodium: 137 mmol/L (ref 135–145)
Total Bilirubin: 0.5 mg/dL (ref 0.3–1.2)
Total Protein: 7.1 g/dL (ref 6.5–8.1)

## 2021-08-17 LAB — CBC WITH DIFFERENTIAL (CANCER CENTER ONLY)
Abs Immature Granulocytes: 0.02 10*3/uL (ref 0.00–0.07)
Basophils Absolute: 0 10*3/uL (ref 0.0–0.1)
Basophils Relative: 1 %
Eosinophils Absolute: 0.3 10*3/uL (ref 0.0–0.5)
Eosinophils Relative: 7 %
HCT: 37 % (ref 36.0–46.0)
Hemoglobin: 12.2 g/dL (ref 12.0–15.0)
Immature Granulocytes: 0 %
Lymphocytes Relative: 27 %
Lymphs Abs: 1.3 10*3/uL (ref 0.7–4.0)
MCH: 32.3 pg (ref 26.0–34.0)
MCHC: 33 g/dL (ref 30.0–36.0)
MCV: 97.9 fL (ref 80.0–100.0)
Monocytes Absolute: 0.9 10*3/uL (ref 0.1–1.0)
Monocytes Relative: 19 %
Neutro Abs: 2.1 10*3/uL (ref 1.7–7.7)
Neutrophils Relative %: 46 %
Platelet Count: 258 10*3/uL (ref 150–400)
RBC: 3.78 MIL/uL — ABNORMAL LOW (ref 3.87–5.11)
RDW: 16.7 % — ABNORMAL HIGH (ref 11.5–15.5)
WBC Count: 4.6 10*3/uL (ref 4.0–10.5)
nRBC: 0 % (ref 0.0–0.2)

## 2021-08-17 MED ORDER — MORPHINE SULFATE ER 30 MG PO TBCR: 30.0000 mg | EXTENDED_RELEASE_TABLET | Freq: Two times a day (BID) | ORAL | 0 refills | Status: DC

## 2021-08-17 MED ORDER — SODIUM CHLORIDE 0.9 % IV SOLN
500.0000 mg/m2 | Freq: Once | INTRAVENOUS | Status: AC
  Administered 2021-08-17: 700 mg via INTRAVENOUS
  Filled 2021-08-17: qty 20

## 2021-08-17 MED ORDER — SODIUM CHLORIDE 0.9 % IV SOLN: Freq: Once | INTRAVENOUS | Status: AC

## 2021-08-17 MED ORDER — DRONABINOL 5 MG PO CAPS
5.0000 mg | ORAL_CAPSULE | Freq: Two times a day (BID) | ORAL | 1 refills | Status: DC
  Filled 2021-08-17: qty 30, 15d supply, fill #0

## 2021-08-17 NOTE — Patient Instructions (Signed)
[facility]  Discharge Instructions: ?Thank you for choosing [facility] to provide your oncology and hematology care.  ? ?If you have a lab appointment with the [facility], please go directly to the [facility] and check in at the registration area. ?  ?Wear comfortable clothing and clothing appropriate for easy access to any Portacath or PICC line.  ? ?We strive to give you quality time with your provider. You may need to reschedule your appointment if you arrive late (15 or more minutes).  Arriving late affects you and other patients whose appointments are after yours.  Also, if you miss three or more appointments without notifying the office, you may be dismissed from the clinic at the provider?s discretion.    ?  ?For prescription refill requests, have your pharmacy contact our office and allow 72 hours for refills to be completed.   ? ?Today you received the following chemotherapy and/or immunotherapy agents Alimta (also Zometa)    ?  ?To help prevent nausea and vomiting after your treatment, we encourage you to take your nausea medication as directed. ? ?BELOW ARE SYMPTOMS THAT SHOULD BE REPORTED IMMEDIATELY: ?*FEVER GREATER THAN 100.4 F (38 ?C) OR HIGHER ?*CHILLS OR SWEATING ?*NAUSEA AND VOMITING THAT IS NOT CONTROLLED WITH YOUR NAUSEA MEDICATION ?*UNUSUAL SHORTNESS OF BREATH ?*UNUSUAL BRUISING OR BLEEDING ?*URINARY PROBLEMS (pain or burning when urinating, or frequent urination) ?*BOWEL PROBLEMS (unusual diarrhea, constipation, pain near the anus) ?TENDERNESS IN MOUTH AND THROAT WITH OR WITHOUT PRESENCE OF ULCERS (sore throat, sores in mouth, or a toothache) ?UNUSUAL RASH, SWELLING OR PAIN  ?UNUSUAL VAGINAL DISCHARGE OR ITCHING  ? ?Items with * indicate a potential emergency and should be followed up as soon as possible or go to the Emergency Department if any problems should occur. ? ?Please show the CHEMOTHERAPY ALERT CARD or IMMUNOTHERAPY ALERT CARD at  check-in to the Emergency Department and triage nurse. ? ?Should you have questions after your visit or need to cancel or reschedule your appointment, please contact [facility]  Dept: [phone]  and follow the prompts.  Office hours are 8:00 a.m. to 4:30 p.m. Monday - Friday. Please note that voicemails left after 4:00 p.m. may not be returned until the following business day.  We are closed weekends and major holidays. You have access to a nurse at all times for urgent questions. Please call the main number to the clinic Dept: [phone] and follow the prompts. ? ? ?For any non-urgent questions, you may also contact your provider using MyChart. We now offer e-Visits for anyone 1 and older to request care online for non-urgent symptoms. For details visit [email or web address]. ?  ?Also download the MyChart app! Go to the app store, search "MyChart", open the app, select [facility], and log in with your MyChart username and password. ? ?Due to Covid, a mask is required upon entering the hospital/clinic. If you do not have a mask, one will be given to you upon arrival. For doctor visits, patients may have 1 support person aged 19 or older with them. For treatment visits, patients cannot have anyone with them due to current Covid guidelines and our immunocompromised population.  ? ?Pemetrexed injection ?What is this medication? ?PEMETREXED (PEM e TREX ed) is a chemotherapy drug used to treat lung cancers like non-small cell lung cancer and mesothelioma. It may also be used to treat other cancers. ?This medicine may be used for other purposes; ask your health  care provider or pharmacist if you have questions. ?COMMON BRAND NAME(S): Alimta, PEMFEXY ?What should I tell my care team before I take this medication? ?They need to know if you have any of these conditions: ?infection (especially a virus infection such as chickenpox, cold sores, or herpes) ?kidney disease ?low blood  counts, like low white cell, platelet, or red cell counts ?lung or breathing disease, like asthma ?radiation therapy ?an unusual or allergic reaction to pemetrexed, other medicines, foods, dyes, or preservative ?pregnant or trying to get pregnant ?breast-feeding ?How should I use this medication? ?This drug is given as an infusion into a vein. It is administered in a hospital or clinic by a specially trained health care professional. ?Talk to your pediatrician regarding the use of this medicine in children. Special care may be needed. ?Overdosage: If you think you have taken too much of this medicine contact a poison control center or emergency room at once. ?NOTE: This medicine is only for you. Do not share this medicine with others. ?What if I miss a dose? ?It is important not to miss your dose. Call your doctor or health care professional if you are unable to keep an appointment. ?What may interact with this medication? ?This medicine may interact with the following medications: ?Ibuprofen ?This list may not describe all possible interactions. Give your health care provider a list of all the medicines, herbs, non-prescription drugs, or dietary supplements you use. Also tell them if you smoke, drink alcohol, or use illegal drugs. Some items may interact with your medicine. ?What should I watch for while using this medication? ?Visit your doctor for checks on your progress. This drug may make you feel generally unwell. This is not uncommon, as chemotherapy can affect healthy cells as well as cancer cells. Report any side effects. Continue your course of treatment even though you feel ill unless your doctor tells you to stop. ?In some cases, you may be given additional medicines to help with side effects. Follow all directions for their use. ?Call your doctor or health care professional for advice if you get a fever, chills or sore throat, or other symptoms of a cold or flu. Do not treat yourself. This drug  decreases your body's ability to fight infections. Try to avoid being around people who are sick. ?This medicine may increase your risk to bruise or bleed. Call your doctor or health care professional if you notice any unusual bleeding. ?Be careful brushing and flossing your teeth or using a toothpick because you may get an infection or bleed more easily. If you have any dental work done, tell your dentist you are receiving this medicine. ?Avoid taking products that contain aspirin, acetaminophen, ibuprofen, naproxen, or ketoprofen unless instructed by your doctor. These medicines may hide a fever. ?Call your doctor or health care professional if you get diarrhea or mouth sores. Do not treat yourself. ?To protect your kidneys, drink water or other fluids as directed while you are taking this medicine. ?Do not become pregnant while taking this medicine or for 6 months after stopping it. Women should inform their doctor if they wish to become pregnant or think they might be pregnant. Men should not father a child while taking this medicine and for 3 months after stopping it. This may interfere with the ability to father a child. You should talk to your doctor or health care professional if you are concerned about your fertility. There is a potential for serious side effects to an unborn child. Talk  to your health care professional or pharmacist for more information. Do not breast-feed an infant while taking this medicine or for 1 week after stopping it. ?What side effects may I notice from receiving this medication? ?Side effects that you should report to your doctor or health care professional as soon as possible: ?allergic reactions like skin rash, itching or hives, swelling of the face, lips, or tongue ?breathing problems ?redness, blistering, peeling or loosening of the skin, including inside the mouth ?signs and symptoms of bleeding such as bloody or black, tarry stools; red or dark-brown urine; spitting up blood  or brown material that looks like coffee grounds; red spots on the skin; unusual bruising or bleeding from the eye, gums, or nose ?signs and symptoms of infection like fever or chills; cough; sore throat; pain or tr

## 2021-08-17 NOTE — Progress Notes (Signed)
Nutrition Follow-up: ? ?Patient currently receiving maintenance Alimta for stage IV  non small cell lung cancer.  ? ?Met with patient during infusion. She reports improved appetite and eating well. Patient is "snacking all day" Patient surprised that she had lost a couple of pounds. Patient reports increased energy levels and has been more active lately. She is consistently drinking one Premier Protein. Patient will have an Ensure Plus occasionally. She keeps both of these supplements in the fridge. Patient denies nausea, vomiting, diarrhea, constipation.  ? ? ?Medications: reviewed  ? ?Labs: reviewed  ? ?Anthropometrics: Weight 88 lb 8 oz today  ? ?3/8 - 90 lb 6.4 oz ?2/15 - 88 lb 3.2 oz  ? ?NUTRITION DIAGNOSIS: Severe malnutrition continues ? ? ?INTERVENTION:  ?Continue eating high calorie, high protein foods ?Continue taking appetite stimulant as prescribed ?Encouraged patient to drink Ensure Plus vs Premier for added calories - coupons provided ?Continue activity as able ?  ? ?MONITORING, EVALUATION, GOAL: weight trends, intake  ? ? ?NEXT VISIT: To be scheduled  ? ? ? ?

## 2021-08-17 NOTE — Progress Notes (Signed)
? ?  ?Palliative Medicine ?[facility]  ?Telephone:([phone] [phone] ? ? ?Name: Kristi Jennings ?Date: 08/17/2021 ?MRN: [id number]  ?DOB: [birth year] ? ?Patient Care Team: ?[name], NP as PCP - General (Family Medicine) ?[place] as Registered Nurse (Hospice and Palliative Medicine)  ? ? ?INTERVAL HISTORY: ?Kristi Jennings is a 76 y.o. 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 Palliative ask to see for symptom management. ? ?SOCIAL HISTORY:    ? reports that she quit smoking about 12 years ago. Her smoking use included cigarettes. She has a 43.00 pack-year smoking history. She has never used smokeless tobacco. She reports that she does not currently use alcohol. She reports that she does not use drugs. ? ?ADVANCE DIRECTIVES:  ?None on file  ? ?CODE STATUS:  ? ?PAST MEDICAL HISTORY: ?Past Medical History:  ?Diagnosis Date  ? Borderline diabetes mellitus   ? Bronchogenic cancer of right lung ([facility])   ? Dyslipidemia   ? History of radiation therapy   ? right chest 12/21/2020-12/24/2020  Dr Gery Pray  ? History of radiation therapy   ? right lung 06/07/2021-06/17/2021  Dr Gery Pray  ? Hypertension   ? Polymyalgia rheumatica ([facility])   ? ?ALLERGIES:  is allergic to shellfish-derived products and augmentin [amoxicillin-pot clavulanate]. ? ?MEDICATIONS:  ?Current Outpatient Medications  ?Medication Sig Dispense Refill  ? acetaminophen (TYLENOL) 650 MG CR tablet Take 1,300 mg by mouth 3 (three) times daily as needed for pain.    ? aspirin EC 81 MG tablet Take 81 mg by mouth 4 (four) times a week. Swallow whole.    ? atenolol (TENORMIN) 25 MG tablet Take 12.5 mg by mouth at bedtime.    ? Calcium-Vitamin D-Vitamin K (CALCIUM + D) [phone] MG-UNT-MCG CHEW     ? Cholecalciferol (VITAMIN D) 50 MCG (2000 UT) tablet     ? diclofenac Sodium (VOLTAREN) 1 % GEL Apply 4 g topically 4  (four) times daily. (Patient taking differently: Apply 4 g topically 4 (four) times daily as needed (pain).) 150 g 0  ? dronabinol (MARINOL) 5 MG capsule Take 1 capsule (5 mg total) by mouth 2 (two) times daily before a meal. 30 capsule 1  ? enoxaparin (LOVENOX) 60 MG/0.6ML injection Inject 0.6 mLs (60 mg total) into the skin daily. 18 mL 2  ? escitalopram (LEXAPRO) 20 MG tablet Take 20 mg by mouth at bedtime.    ? folic acid (FOLVITE) 1 MG tablet Take 1 tablet (1 mg total) by mouth daily. 30 tablet 2  ? HYDROmorphone (DILAUDID) 4 MG tablet Take 1 tablet (4 mg total) by mouth every 6 (six) hours as needed for severe pain or moderate pain. 120 tablet 0  ? hydrOXYzine (VISTARIL) 25 MG capsule Take 1 capsule (25 mg total) by mouth 2 (two) times daily as needed for itching. 60 capsule 2  ? morphine (MS CONTIN) 30 MG 12 hr tablet Take 1 tablet (30 mg total) by mouth every 12 (twelve) hours. 60 tablet 0  ? Multiple Vitamin (MULTI VITAMIN) TABS     ? ondansetron (ZOFRAN-ODT) 4 MG disintegrating tablet Take 4 mg by mouth 2 (two) times daily as needed for nausea or vomiting. (Patient not taking: Reported on 07/21/2021)    ? predniSONE (DELTASONE) 5 MG tablet Take 5 mg by mouth daily.    ? Salicylic Acid (SCALPICIN EX) Apply 1 application. topically daily  as needed.    ? triamcinolone ointment (KENALOG) 0.1 % Apply 1 application. topically 2 (two) times daily.    ? ?No current facility-administered medications for this visit.  ? ? ?VITAL SIGNS: ?BP (!) 106/58 (BP Location: Left Arm, Patient Position: Sitting, Cuff Size: Small)   Pulse 62  ?There were no vitals filed for this visit. ?  ?Estimated body mass index is 14.73 kg/m? as calculated from the following: ?  Height as of an earlier encounter on 08/17/21: 5\' 5"  (1.651 m). ?  Weight as of an earlier encounter on 08/17/21: 88 lb 8 oz (40.1 kg). ? ?PERFORMANCE STATUS (ECOG) : 1 - Symptomatic but completely ambulatory ? ?Physical Exam ?General: NAD, ambulatory ?Resp: normal  breathing pattern  ?Cardio: RRR ?Skin: thin, dry, scalp dry ?Neurological: AAO x4, mood appropriate  ? ?IMPRESSION: ? ?Kristi Jennings and her husband present to the clinic today for follow-up. No acute distress noted. Is scheduled for infusion today. Tolerating well. She followed up with her Dermatologist regarding her dry scalp and was prescribed shampoo. She has not used as of yet. Overall continues to improve and have perceived quality of life is good.  ? ?Pain ?Kristi Jennings's pain is much improved. She continues to take MS Contin twice daily. Is not having to take oral dilaudid for breakthrough pain. Able to do more around the home and outside of the house. She and husband are appreciative of improved quality of life. Discussed possibly weaning MS Contin over time given improvement. Will continue to closely monitor.  ? ?Constipation ?No concerns with constipation.  Continues to take MiraLAX and senna as needed for bowel regimen. ? ?Anxiety ?Her anxiety has decreased significantly mainly due to to her improved wellbeing and decreased pain.  She states she is focusing on continued improvement and hopes things remain stable.  ? ?Decreased appetite/Weight loss ?Reports appetite is slowly improving but does decrease around treatment time.  ?Encouraged continue with small frequent meals and focusing on high protein intake. Her weight today is 88.8 lbs with a slight decrease from 90 lbs on 3/8,  88lbs 2/15, 89lbs on 1/25 and 92lb on 1/11. Continue with dronabinol. Will continue to monitor.  ? ?Overall patient is doing much better and symptom burden has drastically decreased and almost resolved..  We will continue to support and follow as needed. ? ?PLAN: ?She is very much improved. ?Continue MS Contin  every 12 hours (we will plan to wean over time as tolerated with close monitoring) ?Patient is not requiring breakthrough medication. Much improved. ?Dronabinol 5 mg twice daily for appetite ?Ongoing symptom management (see  above) ?I will plan to see patient back in 3-4 weeks in collaboration with her other appointments.  ? ? ?Patient expressed understanding and was in agreement with this plan. She also understands that She can call the clinic at any time with any questions, concerns, or complaints.  ? ?Time Total: 20 min ? ?Visit consisted of counseling and education dealing with the complex and emotionally intense issues of symptom management and palliative care in the setting of serious and potentially life-threatening illness.Greater than 50%  of this time was spent counseling and coordinating care related to the above assessment and plan. ? ?Alda Lea, AGPCNP-BC  ?[facility] ? ? ? ? ? ? ?

## 2021-08-18 DIAGNOSIS — R69 Illness, unspecified: Secondary | ICD-10-CM | POA: Diagnosis not present

## 2021-08-18 DIAGNOSIS — I10 Essential (primary) hypertension: Secondary | ICD-10-CM | POA: Diagnosis not present

## 2021-08-18 DIAGNOSIS — C3490 Malignant neoplasm of unspecified part of unspecified bronchus or lung: Secondary | ICD-10-CM | POA: Diagnosis not present

## 2021-08-19 DIAGNOSIS — C3490 Malignant neoplasm of unspecified part of unspecified bronchus or lung: Secondary | ICD-10-CM | POA: Diagnosis not present

## 2021-08-26 ENCOUNTER — Other Ambulatory Visit (HOSPITAL_BASED_OUTPATIENT_CLINIC_OR_DEPARTMENT_OTHER): Payer: Self-pay

## 2021-08-31 DIAGNOSIS — L2089 Other atopic dermatitis: Secondary | ICD-10-CM | POA: Diagnosis not present

## 2021-08-31 DIAGNOSIS — L570 Actinic keratosis: Secondary | ICD-10-CM | POA: Diagnosis not present

## 2021-08-31 DIAGNOSIS — L578 Other skin changes due to chronic exposure to nonionizing radiation: Secondary | ICD-10-CM | POA: Diagnosis not present

## 2021-08-31 DIAGNOSIS — L821 Other seborrheic keratosis: Secondary | ICD-10-CM | POA: Diagnosis not present

## 2021-09-01 DIAGNOSIS — C3490 Malignant neoplasm of unspecified part of unspecified bronchus or lung: Secondary | ICD-10-CM | POA: Diagnosis not present

## 2021-09-01 DIAGNOSIS — M154 Erosive (osteo)arthritis: Secondary | ICD-10-CM | POA: Diagnosis not present

## 2021-09-01 DIAGNOSIS — M0579 Rheumatoid arthritis with rheumatoid factor of multiple sites without organ or systems involvement: Secondary | ICD-10-CM | POA: Diagnosis not present

## 2021-09-01 DIAGNOSIS — R21 Rash and other nonspecific skin eruption: Secondary | ICD-10-CM | POA: Diagnosis not present

## 2021-09-01 DIAGNOSIS — Z681 Body mass index (BMI) 19 or less, adult: Secondary | ICD-10-CM | POA: Diagnosis not present

## 2021-09-01 DIAGNOSIS — M353 Polymyalgia rheumatica: Secondary | ICD-10-CM | POA: Diagnosis not present

## 2021-09-05 ENCOUNTER — Ambulatory Visit (HOSPITAL_COMMUNITY)
Admission: RE | Admit: 2021-09-05 | Discharge: 2021-09-05 | Disposition: A | Discharge status: Home / Self Care | Payer: Medicare HMO | Source: Ambulatory Visit | Attending: Physician Assistant | Admitting: Physician Assistant

## 2021-09-05 DIAGNOSIS — C801 Malignant (primary) neoplasm, unspecified: Secondary | ICD-10-CM | POA: Diagnosis not present

## 2021-09-05 DIAGNOSIS — K7689 Other specified diseases of liver: Secondary | ICD-10-CM | POA: Diagnosis not present

## 2021-09-05 DIAGNOSIS — C3491 Malignant neoplasm of unspecified part of right bronchus or lung: Secondary | ICD-10-CM | POA: Diagnosis not present

## 2021-09-05 DIAGNOSIS — R918 Other nonspecific abnormal finding of lung field: Secondary | ICD-10-CM | POA: Diagnosis not present

## 2021-09-05 DIAGNOSIS — C7800 Secondary malignant neoplasm of unspecified lung: Secondary | ICD-10-CM | POA: Diagnosis not present

## 2021-09-05 DIAGNOSIS — I700 Atherosclerosis of aorta: Secondary | ICD-10-CM | POA: Diagnosis not present

## 2021-09-05 MED ORDER — SODIUM CHLORIDE (PF) 0.9 % IJ SOLN
INTRAMUSCULAR | Status: AC
  Filled 2021-09-05: qty 50

## 2021-09-05 MED ORDER — IOHEXOL 300 MG/ML  SOLN
100.0000 mL | Freq: Once | INTRAMUSCULAR | Status: AC | PRN
  Administered 2021-09-05: 100 mL via INTRAVENOUS

## 2021-09-06 DIAGNOSIS — C3491 Malignant neoplasm of unspecified part of right bronchus or lung: Secondary | ICD-10-CM | POA: Diagnosis not present

## 2021-09-07 ENCOUNTER — Other Ambulatory Visit: Payer: Self-pay

## 2021-09-07 ENCOUNTER — Inpatient Hospital Stay (HOSPITAL_BASED_OUTPATIENT_CLINIC_OR_DEPARTMENT_OTHER): Payer: Medicare HMO | Admitting: Nurse Practitioner

## 2021-09-07 ENCOUNTER — Encounter: Payer: Self-pay | Admitting: Nurse Practitioner

## 2021-09-07 ENCOUNTER — Inpatient Hospital Stay: Payer: Medicare HMO | Attending: Internal Medicine

## 2021-09-07 ENCOUNTER — Inpatient Hospital Stay: Payer: Medicare HMO | Admitting: Internal Medicine

## 2021-09-07 ENCOUNTER — Inpatient Hospital Stay: Payer: Medicare HMO

## 2021-09-07 VITALS — BP 152/74 | HR 68 | Temp 98.2°F | Resp 18 | Ht 65.0 in | Wt 89.6 lb

## 2021-09-07 DIAGNOSIS — C3491 Malignant neoplasm of unspecified part of right bronchus or lung: Secondary | ICD-10-CM

## 2021-09-07 DIAGNOSIS — K5900 Constipation, unspecified: Secondary | ICD-10-CM

## 2021-09-07 DIAGNOSIS — Z5111 Encounter for antineoplastic chemotherapy: Secondary | ICD-10-CM | POA: Insufficient documentation

## 2021-09-07 DIAGNOSIS — G893 Neoplasm related pain (acute) (chronic): Secondary | ICD-10-CM

## 2021-09-07 DIAGNOSIS — C3411 Malignant neoplasm of upper lobe, right bronchus or lung: Secondary | ICD-10-CM | POA: Diagnosis not present

## 2021-09-07 DIAGNOSIS — C787 Secondary malignant neoplasm of liver and intrahepatic bile duct: Secondary | ICD-10-CM | POA: Insufficient documentation

## 2021-09-07 DIAGNOSIS — C7971 Secondary malignant neoplasm of right adrenal gland: Secondary | ICD-10-CM | POA: Diagnosis not present

## 2021-09-07 DIAGNOSIS — R530 Neoplastic (malignant) related fatigue: Secondary | ICD-10-CM

## 2021-09-07 DIAGNOSIS — C7951 Secondary malignant neoplasm of bone: Secondary | ICD-10-CM

## 2021-09-07 DIAGNOSIS — Z515 Encounter for palliative care: Secondary | ICD-10-CM | POA: Diagnosis not present

## 2021-09-07 DIAGNOSIS — R634 Abnormal weight loss: Secondary | ICD-10-CM

## 2021-09-07 LAB — CBC WITH DIFFERENTIAL (CANCER CENTER ONLY)
Abs Immature Granulocytes: 0.02 10*3/uL (ref 0.00–0.07)
Basophils Absolute: 0 10*3/uL (ref 0.0–0.1)
Basophils Relative: 1 %
Eosinophils Absolute: 0.2 10*3/uL (ref 0.0–0.5)
Eosinophils Relative: 3 %
HCT: 35.4 % — ABNORMAL LOW (ref 36.0–46.0)
Hemoglobin: 11.6 g/dL — ABNORMAL LOW (ref 12.0–15.0)
Immature Granulocytes: 0 %
Lymphocytes Relative: 16 %
Lymphs Abs: 0.9 10*3/uL (ref 0.7–4.0)
MCH: 33 pg (ref 26.0–34.0)
MCHC: 32.8 g/dL (ref 30.0–36.0)
MCV: 100.9 fL — ABNORMAL HIGH (ref 80.0–100.0)
Monocytes Absolute: 0.7 10*3/uL (ref 0.1–1.0)
Monocytes Relative: 13 %
Neutro Abs: 3.5 10*3/uL (ref 1.7–7.7)
Neutrophils Relative %: 67 %
Platelet Count: 254 10*3/uL (ref 150–400)
RBC: 3.51 MIL/uL — ABNORMAL LOW (ref 3.87–5.11)
RDW: 14.8 % (ref 11.5–15.5)
WBC Count: 5.2 10*3/uL (ref 4.0–10.5)
nRBC: 0 % (ref 0.0–0.2)

## 2021-09-07 LAB — CMP (CANCER CENTER ONLY)
ALT: 10 U/L (ref 0–44)
AST: 19 U/L (ref 15–41)
Albumin: 3.9 g/dL (ref 3.5–5.0)
Alkaline Phosphatase: 54 U/L (ref 38–126)
Anion gap: 4 — ABNORMAL LOW (ref 5–15)
BUN: 17 mg/dL (ref 8–23)
CO2: 34 mmol/L — ABNORMAL HIGH (ref 22–32)
Calcium: 9.1 mg/dL (ref 8.9–10.3)
Chloride: 99 mmol/L (ref 98–111)
Creatinine: 0.68 mg/dL (ref 0.44–1.00)
GFR, Estimated: >60 mL/min (ref >60)
Glucose, Bld: 92 mg/dL (ref 70–99)
Potassium: 3.6 mmol/L (ref 3.5–5.1)
Sodium: 137 mmol/L (ref 135–145)
Total Bilirubin: 0.5 mg/dL (ref 0.3–1.2)
Total Protein: 6.9 g/dL (ref 6.5–8.1)

## 2021-09-07 MED ORDER — SODIUM CHLORIDE 0.9 % IV SOLN: Freq: Once | INTRAVENOUS | Status: AC

## 2021-09-07 MED ORDER — SODIUM CHLORIDE 0.9 % IV SOLN
500.0000 mg/m2 | Freq: Once | INTRAVENOUS | Status: AC
  Administered 2021-09-07: 700 mg via INTRAVENOUS
  Filled 2021-09-07: qty 20

## 2021-09-07 NOTE — Progress Notes (Signed)
?    [facility] ?[phone]) [phone]   [phone] ? ?OFFICE PROGRESS NOTE ? ?Kristi Saver, NP ?[address] ?[place] [address] ? ?DIAGNOSIS: Stage IV (T3, N2, M1 C) non-small cell lung cancer with unknown histologic subtype.  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 ?  ?DETECTED ALTERATION(S) / BIOMARKER(S)      % CFDNA OR AMPLIFICATION        ASSOCIATED FDA-APPROVED THERAPIES         CLINICAL TRIAL AVAILABILITY ?KRASG12C ?8.9% ?  ?Sotorasib ?Yes ?[id number] ?7.2% ?None     ?Yes ?  ?  ?PRIOR THERAPY:  Palliative radiotherapy to the painful bone lesions under the care of Dr. Sondra Come. ?  ?CURRENT THERAPY: ? 1) Palliative systemic chemotherapy with carboplatin for AUC of 5, Alimta 500 Mg/M2 and Keytruda 200 Mg IV every 3 weeks.  First dose of treatment on December 29, 2020. Status post 12  cycles.  Starting from cycle #5, the patient is going to start maintenance Alimta and Keytruda. Keytruda discontinued from cycle #7 due to myalgias.  ?2)Zometa every 6 weeks starting on 02/10/21 ?3) Possible palliative radiation to the enlarging pulmonary mass under the care of Dr. Sondra Come. ? ?INTERVAL HISTORY: ?Kristi Jennings 76 y.o. female returns to the clinic today for follow-up visit accompanied by her husband.  The patient has no complaints today.  She denied having any current chest pain, shortness of breath, cough or hemoptysis.  She denied having any fever or chills.  She has no nausea, vomiting, diarrhea or constipation.  She has no headache or visual changes.  She has no complaints today  She continues to tolerate her treatment with maintenance Alimta fairly well.  She had repeat CT scan of the chest, abdomen pelvis performed recently and she is here for evaluation and discussion of her scan results. ? ?MEDICAL  HISTORY: ?Past Medical History:  ?Diagnosis Date  ? Borderline diabetes mellitus   ? Bronchogenic cancer of right lung ([facility])   ? Dyslipidemia   ? History of radiation therapy   ? right chest 12/21/2020-12/24/2020  Dr Gery Pray  ? History of radiation therapy   ? right lung 06/07/2021-06/17/2021  Dr Gery Pray  ? Hypertension   ? Polymyalgia rheumatica ([facility])   ? ? ?ALLERGIES:  is allergic to shellfish-derived products and augmentin [amoxicillin-pot clavulanate]. ? ?MEDICATIONS:  ?Current Outpatient Medications  ?Medication Sig Dispense Refill  ? acetaminophen (TYLENOL) 650 MG CR tablet Take 1,300 mg by mouth 3 (three) times daily as needed for pain.    ? aspirin EC 81 MG tablet Take 81 mg by mouth 4 (four) times a week. Swallow whole.    ? atenolol (TENORMIN) 25 MG tablet Take 12.5 mg by mouth at bedtime.    ? Calcium-Vitamin D-Vitamin K (CALCIUM + D) [phone] MG-UNT-MCG CHEW     ? Cholecalciferol (VITAMIN D) 50 MCG (2000 UT) tablet     ? diclofenac Sodium (VOLTAREN) 1 % GEL Apply 4 g topically 4 (four) times daily. (Patient taking differently: Apply 4 g topically 4 (four) times daily as needed (pain).) 150 g 0  ? dronabinol (MARINOL) 5 MG capsule Take 1 capsule (5 mg total) by mouth 2 (two) times daily before a meal. 30 capsule 1  ? enoxaparin (LOVENOX) 60 MG/0.6ML injection Inject 0.6 mLs (  60 mg total) into the skin daily. 18 mL 2  ? escitalopram (LEXAPRO) 20 MG tablet Take 20 mg by mouth at bedtime.    ? folic acid (FOLVITE) 1 MG tablet Take 1 tablet (1 mg total) by mouth daily. 30 tablet 2  ? HYDROmorphone (DILAUDID) 4 MG tablet Take 1 tablet (4 mg total) by mouth every 6 (six) hours as needed for severe pain or moderate pain. 120 tablet 0  ? hydrOXYzine (VISTARIL) 25 MG capsule Take 1 capsule (25 mg total) by mouth 2 (two) times daily as needed for itching. 60 capsule 2  ? morphine (MS CONTIN) 30 MG 12 hr tablet Take 1 tablet (30 mg total) by mouth every 12 (twelve) hours. 60 tablet 0  ? Multiple Vitamin  (MULTI VITAMIN) TABS     ? ondansetron (ZOFRAN-ODT) 4 MG disintegrating tablet Take 4 mg by mouth 2 (two) times daily as needed for nausea or vomiting. (Patient not taking: Reported on 07/21/2021)    ? predniSONE (DELTASONE) 5 MG tablet Take 5 mg by mouth daily.    ? Salicylic Acid (SCALPICIN EX) Apply 1 application. topically daily as needed.    ? triamcinolone ointment (KENALOG) 0.1 % Apply 1 application. topically 2 (two) times daily.    ? ?No current facility-administered medications for this visit.  ? ? ?SURGICAL HISTORY: No past surgical history on file. ? ?REVIEW OF SYSTEMS:  Constitutional: positive for fatigue ?Eyes: negative ?Ears, nose, mouth, throat, and face: negative ?Respiratory: negative ?Cardiovascular: negative ?Gastrointestinal: negative ?Genitourinary:negative ?Integument/breast: negative ?Hematologic/lymphatic: negative ?Musculoskeletal:negative ?Neurological: negative ?Behavioral/Psych: negative ?Endocrine: negative ?Allergic/Immunologic: negative  ? ?PHYSICAL EXAMINATION: General appearance: alert, cooperative, fatigued, and no distress ?Head: Normocephalic, without obvious abnormality, atraumatic ?Neck: no adenopathy, no JVD, supple, symmetrical, trachea midline, and thyroid not enlarged, symmetric, no tenderness/mass/nodules ?Lymph nodes: Cervical, supraclavicular, and axillary nodes normal. ?Resp: clear to auscultation bilaterally ?Back: symmetric, no curvature. ROM normal. No CVA tenderness. ?Cardio: regular rate and rhythm, S1, S2 normal, no murmur, click, rub or gallop ?GI: soft, non-tender; bowel sounds normal; no masses,  no organomegaly ?Extremities: extremities normal, atraumatic, no cyanosis or edema ?Neurologic: Alert and oriented X 3, normal strength and tone. Normal symmetric reflexes. Normal coordination and gait ? ?ECOG PERFORMANCE STATUS: 1 - Symptomatic but completely ambulatory ? ?Blood pressure (!) 152/74, pulse 68, temperature 98.2 ?F (36.8 ?C), temperature source Oral,  resp. rate 18, height 5\' 5"  (1.651 m), weight 89 lb 9.6 oz (40.6 kg), SpO2 95 %. ? ?LABORATORY DATA: ?Lab Results  ?Component Value Date  ? WBC 5.2 09/07/2021  ? HGB 11.6 (L) 09/07/2021  ? HCT 35.4 (L) 09/07/2021  ? MCV 100.9 (H) 09/07/2021  ? PLT 254 09/07/2021  ? ? ?  Chemistry   ?   ?Component Value Date/Time  ? NA 137 08/17/2021 0922  ? K 4.2 08/17/2021 0922  ? CL 98 08/17/2021 0922  ? CO2 34 (H) 08/17/2021 0947  ? BUN 13 08/17/2021 0922  ? CREATININE 0.61 08/17/2021 0922  ?    ?Component Value Date/Time  ? CALCIUM 9.4 08/17/2021 0922  ? ALKPHOS 56 08/17/2021 0922  ? AST 19 08/17/2021 0922  ? ALT 11 08/17/2021 0922  ? BILITOT 0.5 08/17/2021 0962  ?  ? ? ? ?RADIOGRAPHIC STUDIES: ?CT Chest W Contrast ? ?Result Date: 09/05/2021 ?CLINICAL DATA:  Metastatic lung cancer restaging * Tracking Code: BO * EXAM: CT CHEST, ABDOMEN, AND PELVIS WITH CONTRAST TECHNIQUE: Multidetector CT imaging of the chest, abdomen and pelvis was performed following the  standard protocol during bolus administration of intravenous contrast. RADIATION DOSE REDUCTION: This exam was performed according to the departmental dose-optimization program which includes automated exposure control, adjustment of the mA and/or kV according to patient size and/or use of iterative reconstruction technique. CONTRAST:  118mL OMNIPAQUE IOHEXOL 300 MG/ML SOLN, additional oral enteric contrast COMPARISON:  07/04/2021 FINDINGS: CT CHEST FINDINGS Cardiovascular: Aortic atherosclerosis. Unchanged enlargement of the tubular ascending thoracic aorta, measuring up to 4.3 x 4.1 cm. Normal heart size. Left and right coronary artery calcifications. No pericardial effusion. Mediastinum/Nodes: No enlarged mediastinal, hilar, or axillary lymph nodes. Thyroid gland, trachea, and esophagus demonstrate no significant findings. Lungs/Pleura: Unchanged partially cavitary mass of the apical right upper lobe with associated volume loss, measuring 4.5 x 2.7 cm (series 506, image 33).  No pleural effusion or pneumothorax. Musculoskeletal: No chest wall mass. CT ABDOMEN PELVIS FINDINGS Hepatobiliary: Hypodense lesion of the posterior right lobe of the liver, hepatic segment VII is slightly dimini

## 2021-09-07 NOTE — Patient Instructions (Signed)
[facility]  Discharge Instructions: ?Thank you for choosing [facility] to provide your oncology and hematology care.  ? ?If you have a lab appointment with the [facility], please go directly to the [facility] and check in at the registration area. ?  ?Wear comfortable clothing and clothing appropriate for easy access to any Portacath or PICC line.  ? ?We strive to give you quality time with your provider. You may need to reschedule your appointment if you arrive late (15 or more minutes).  Arriving late affects you and other patients whose appointments are after yours.  Also, if you miss three or more appointments without notifying the office, you may be dismissed from the clinic at the provider?s discretion.    ?  ?For prescription refill requests, have your pharmacy contact our office and allow 72 hours for refills to be completed.   ? ?Today you received the following chemotherapy and/or immunotherapy agents: Alimta    ?  ?To help prevent nausea and vomiting after your treatment, we encourage you to take your nausea medication as directed. ? ?BELOW ARE SYMPTOMS THAT SHOULD BE REPORTED IMMEDIATELY: ?*FEVER GREATER THAN 100.4 F (38 ?C) OR HIGHER ?*CHILLS OR SWEATING ?*NAUSEA AND VOMITING THAT IS NOT CONTROLLED WITH YOUR NAUSEA MEDICATION ?*UNUSUAL SHORTNESS OF BREATH ?*UNUSUAL BRUISING OR BLEEDING ?*URINARY PROBLEMS (pain or burning when urinating, or frequent urination) ?*BOWEL PROBLEMS (unusual diarrhea, constipation, pain near the anus) ?TENDERNESS IN MOUTH AND THROAT WITH OR WITHOUT PRESENCE OF ULCERS (sore throat, sores in mouth, or a toothache) ?UNUSUAL RASH, SWELLING OR PAIN  ?UNUSUAL VAGINAL DISCHARGE OR ITCHING  ? ?Items with * indicate a potential emergency and should be followed up as soon as possible or go to the Emergency Department if any problems should occur. ? ?Please show the CHEMOTHERAPY ALERT CARD or IMMUNOTHERAPY ALERT CARD at check-in to the  Emergency Department and triage nurse. ? ?Should you have questions after your visit or need to cancel or reschedule your appointment, please contact [facility]  Dept: [phone]  and follow the prompts.  Office hours are 8:00 a.m. to 4:30 p.m. Monday - Friday. Please note that voicemails left after 4:00 p.m. may not be returned until the following business day.  We are closed weekends and major holidays. You have access to a nurse at all times for urgent questions. Please call the main number to the clinic Dept: [phone] and follow the prompts. ? ? ?For any non-urgent questions, you may also contact your provider using MyChart. We now offer e-Visits for anyone 36 and older to request care online for non-urgent symptoms. For details visit [email or web address]. ?  ?Also download the MyChart app! Go to the app store, search "MyChart", open the app, select [facility], and log in with your MyChart username and password. ? ?Due to Covid, a mask is required upon entering the hospital/clinic. If you do not have a mask, one will be given to you upon arrival. For doctor visits, patients may have 1 support person aged 45 or older with them. For treatment visits, patients cannot have anyone with them due to current Covid guidelines and our immunocompromised population.  ? ?

## 2021-09-07 NOTE — Progress Notes (Signed)
? ?  ?Palliative Medicine ?[facility]  ?[phone] [phone] ? ? ?Name: Kristi Jennings ?Date: 09/07/2021 ?MRN: [id number]  ?DOB: 02-20-1946 ? ?Patient Care Team: ?[name], NP as PCP - General (Family Medicine) ?[place] as Registered Nurse (Hospice and Palliative Medicine)  ? ? ?INTERVAL HISTORY: ?Kristi Jennings is a 76 y.o. 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 Palliative ask to see for symptom management. ? ?SOCIAL HISTORY:    ? reports that she quit smoking about 12 years ago. Her smoking use included cigarettes. She has a 43.00 pack-year smoking history. She has never used smokeless tobacco. She reports that she does not currently use alcohol. She reports that she does not use drugs. ? ?ADVANCE DIRECTIVES:  ?None on file  ? ?CODE STATUS:  ? ?PAST MEDICAL HISTORY: ?Past Medical History:  ?Diagnosis Date  ? Borderline diabetes mellitus   ? Bronchogenic cancer of right lung ([facility])   ? Dyslipidemia   ? History of radiation therapy   ? right chest 12/21/2020-12/24/2020  Dr Gery Pray  ? History of radiation therapy   ? right lung 06/07/2021-06/17/2021  Dr Gery Pray  ? Hypertension   ? Polymyalgia rheumatica ([facility])   ? ?ALLERGIES:  is allergic to shellfish-derived products and augmentin [amoxicillin-pot clavulanate]. ? ?MEDICATIONS:  ?Current Outpatient Medications  ?Medication Sig Dispense Refill  ? acetaminophen (TYLENOL) 650 MG CR tablet Take 1,300 mg by mouth 3 (three) times daily as needed for pain.    ? aspirin EC 81 MG tablet Take 81 mg by mouth 4 (four) times a week. Swallow whole.    ? atenolol (TENORMIN) 25 MG tablet Take 12.5 mg by mouth at bedtime.    ? Calcium-Vitamin D-Vitamin K (CALCIUM + D) [phone] MG-UNT-MCG CHEW     ? Cholecalciferol (VITAMIN D) 50 MCG (2000 UT) tablet     ? diclofenac Sodium (VOLTAREN) 1 % GEL Apply 4 g topically 4  (four) times daily. (Patient taking differently: Apply 4 g topically 4 (four) times daily as needed (pain).) 150 g 0  ? dronabinol (MARINOL) 5 MG capsule Take 1 capsule (5 mg total) by mouth 2 (two) times daily before a meal. 30 capsule 1  ? enoxaparin (LOVENOX) 60 MG/0.6ML injection Inject 0.6 mLs (60 mg total) into the skin daily. 18 mL 2  ? escitalopram (LEXAPRO) 20 MG tablet Take 20 mg by mouth at bedtime.    ? fexofenadine (ALLEGRA) 180 MG tablet Take 180 mg by mouth daily.    ? folic acid (FOLVITE) 1 MG tablet Take 1 tablet (1 mg total) by mouth daily. 30 tablet 2  ? HYDROmorphone (DILAUDID) 4 MG tablet Take 1 tablet (4 mg total) by mouth every 6 (six) hours as needed for severe pain or moderate pain. 120 tablet 0  ? hydrOXYzine (VISTARIL) 25 MG capsule Take 1 capsule (25 mg total) by mouth 2 (two) times daily as needed for itching. 60 capsule 2  ? mirtazapine (REMERON) 15 MG tablet Take 15 mg by mouth at bedtime.    ? morphine (MS CONTIN) 30 MG 12 hr tablet Take 1 tablet (30 mg total) by mouth every 12 (twelve) hours. 60 tablet 0  ? Multiple Vitamin (MULTI VITAMIN) TABS     ? ondansetron (ZOFRAN-ODT) 4 MG disintegrating tablet Take 4 mg by mouth 2 (two) times daily as needed for nausea or vomiting. (Patient not taking:  Reported on 07/21/2021)    ? predniSONE (DELTASONE) 5 MG tablet Take 5 mg by mouth daily. (Patient not taking: Reported on 09/07/2021)    ? Salicylic Acid (SCALPICIN EX) Apply 1 application. topically daily as needed.    ? triamcinolone ointment (KENALOG) 0.1 % Apply 1 application. topically 2 (two) times daily.    ? ?No current facility-administered medications for this visit.  ? ? ?VITAL SIGNS: ?There were no vitals taken for this visit. ?There were no vitals filed for this visit. ?  ?Estimated body mass index is 14.91 kg/m? as calculated from the following: ?  Height as of an earlier encounter on 09/07/21: 5\' 5"  (1.651 m). ?  Weight as of an earlier encounter on 09/07/21: 89 lb 9.6 oz (40.6  kg). ? ?PERFORMANCE STATUS (ECOG) : 1 - Symptomatic but completely ambulatory ? ?Physical Exam ?General: NAD, ambulatory ?Resp: normal breathing pattern  ?Cardio: RRR ?Skin: thin, dry, scalp dry-improving, right shin bruise ?Neurological: AAO x4, mood appropriate  ? ?IMPRESSION: ? ?Kristi Jennings and her husband present to the clinic today for follow-up. No acute distress noted. Ambulatory with no assistive devices. Is scheduled for infusion today. Tolerating well. Reports she recently had a follow-up with her Dermatologist who also prescribed Remeron and Allegra for her dry scalp and itching. Overall continues to improve and have perceived quality of life is good. Reports being more social with her friends and somewhat interested in driving if she is able.  ? ?Pain ?Kristi Jennings's pain is much improved. She continues to take MS Contin twice daily. No longer requiring dilaudid for breakthrough pain. Able to do more around the home and outside of the house. She and husband are appreciative of improved quality of life. Will wean MS Contin down to 15mg  at next refill. Will continue to monitor closely.  ? ?Constipation ?No concerns with constipation.  Continues to take MiraLAX and senna as needed for bowel regimen. ? ?Anxiety ?Her anxiety has decreased significantly mainly due to to her improved wellbeing and decreased pain.  She states she is focusing on continued improvement and hopes things remain stable.  ? ?Decreased appetite/Weight loss ?Reports appetite is slowly improving but does decrease around treatment time.  ?Encouraged continue with small frequent meals and focusing on high protein intake. Her weight today is is up to 89 lbs from 88.8 lbs on 3/29, 90 lbs on 3/8,  88lbs 2/15, 89lbs on 1/25 and 92lb on 1/11. Continue with dronabinol. Will continue to monitor.  ? ?Overall patient is doing much better and symptom burden has drastically decreased and almost resolved..  We will continue to support and follow as  needed. ? ?PLAN: ?She is very much improved. ?Continue MS Contin  every 12 hours (we will plan to wean to 15mg  at next refill with close monitoring) ?Patient is not requiring breakthrough medication. Much improved. ?Dronabinol 5 mg twice daily for appetite ?Ongoing symptom management (see above) ?I will plan to see patient back in 3-4 weeks in collaboration with her other appointments.  ? ? ?Patient expressed understanding and was in agreement with this plan. She also understands that She can call the clinic at any time with any questions, concerns, or complaints.  ? ?Time Total: 25 min.  ? ?Visit consisted of counseling and education dealing with the complex and emotionally intense issues of symptom management and palliative care in the setting of serious and potentially life-threatening illness.Greater than 50%  of this time was spent counseling and coordinating care related to the  above assessment and plan. ? ?Alda Lea, AGPCNP-BC  ?[facility] ? ? ? ? ? ? ? ? ?

## 2021-09-12 DIAGNOSIS — C3491 Malignant neoplasm of unspecified part of right bronchus or lung: Secondary | ICD-10-CM | POA: Diagnosis not present

## 2021-09-18 DIAGNOSIS — C3490 Malignant neoplasm of unspecified part of unspecified bronchus or lung: Secondary | ICD-10-CM | POA: Diagnosis not present

## 2021-09-20 ENCOUNTER — Telehealth: Payer: Self-pay

## 2021-09-20 NOTE — Telephone Encounter (Signed)
Pt's husband called requesting refill on MS  Contin. Refill needed by 5/4. Provider aware. ?

## 2021-09-21 ENCOUNTER — Other Ambulatory Visit: Payer: Self-pay | Admitting: Nurse Practitioner

## 2021-09-21 ENCOUNTER — Other Ambulatory Visit (HOSPITAL_BASED_OUTPATIENT_CLINIC_OR_DEPARTMENT_OTHER): Payer: Self-pay

## 2021-09-21 DIAGNOSIS — C7951 Secondary malignant neoplasm of bone: Secondary | ICD-10-CM

## 2021-09-21 MED ORDER — MORPHINE SULFATE ER 30 MG PO TBCR
30.0000 mg | EXTENDED_RELEASE_TABLET | Freq: Two times a day (BID) | ORAL | 0 refills | Status: DC
  Filled 2021-09-21: qty 60, 30d supply, fill #0

## 2021-09-26 ENCOUNTER — Other Ambulatory Visit (HOSPITAL_BASED_OUTPATIENT_CLINIC_OR_DEPARTMENT_OTHER): Payer: Self-pay

## 2021-09-26 ENCOUNTER — Other Ambulatory Visit: Payer: Self-pay | Admitting: Nurse Practitioner

## 2021-09-26 DIAGNOSIS — C7951 Secondary malignant neoplasm of bone: Secondary | ICD-10-CM

## 2021-09-27 NOTE — Progress Notes (Signed)
[facility] ?OFFICE PROGRESS NOTE ? ?[name], NP ?[address] ?[place] [address] ? ?DIAGNOSIS: Stage IV (T3, N2, M1 C) non-small cell lung cancer with unknown histologic subtype.  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 ?  ?DETECTED ALTERATION(S) / BIOMARKER(S)      % CFDNA OR AMPLIFICATION        ASSOCIATED FDA-APPROVED THERAPIES         CLINICAL TRIAL AVAILABILITY ?KRASG12C ?8.9% ?  ?Sotorasib ?Yes ?[id number] ?7.2% ?None     ?Yes ? ?PRIOR THERAPY:  ?1) Palliative radiotherapy to the painful bone lesions under the care of Dr. Sondra Come. ?2) palliative radiation to the enlarging pulmonary mass under the care of Dr. Sondra Come. Last dose on 06/17/21 ? ?CURRENT THERAPY: 1) Palliative systemic chemotherapy with carboplatin for AUC of 5, Alimta 500 Mg/M2 and Keytruda 200 Mg IV every 3 weeks.  First dose of treatment on December 29, 2020. Status post 13 cycles.  Starting from cycle #5, the patient is going to start maintenance Alimta and Keytruda. Keytruda discontinued from cycle #7 due to myalgias.  ?2)Zometa every 12 weeks starting on 02/10/21 ? ?INTERVAL HISTORY: ?Kristi Jennings 76 y.o. female returns  to the clinic today for a follow-up visit accompanied by her husband.  The patient is feeling fairly well today without any concerning complaints except her nasal congestion from her allergies. It is tolerable but she wanted to mention it. She uses allegra daily.  She is currently undergoing prednisone treatment for her diffuse myalgias under the care of her rheumatologist for polymyalgia rheumatica.  The patient is also followed closely by palliative care for pain management and decreased appetite and weight loss.  The patient is also followed closely by member the nutritionist team. The patient takes Marinol for her  appetite. She has noticed improvement in her appetite and gained a few pounds since her last appointment.. She is also taking MS Contin for pain control.  She has not needed to take any medications for breakthrough pain in several weeks.  She takes senna-s for constipation and wants this as a prescription.  ? ?The patient is currently on Lovenox due to having a lower extremity DVT while on another anticoagulant. They have been giving the lovenox in her thigh since it was irritating her abdomen. Her husband will be out of town for a few days and he is wondering if she can be on xarelto for a few days while he is gone. She has a nurse friend that can help her with the injection.  ? ?She has been having persistent lower extremity ruddy appearance in her feet bilaterally. She has mild/minimal swelling in the dorsal aspect of her feet which is stable. She is wondering what this is from and if it is from her treatment. She used to wear compression stockings in the past but not recently. She denies any fever or chills.  Her breathing is stable without any changes.  She denies any significant dyspnea on exertion, cough, chest pain, or hemoptysis.  Denies any nausea, vomiting, diarrhea, or constipation.  Denies any headache or visual changes. She is followed by dermatology. She is also established with a dermatologist due to a prior rash on her flank.  She is here today for evaluation and repeat blood work before starting cycle #14 of  single agent Alimta.  [name] was discontinued secondary to her myalgias/polymyalgia rheumatica.  ? ? ? ?MEDICAL HISTORY: ?Past Medical History:  ?Diagnosis Date  ? Borderline diabetes mellitus   ? Bronchogenic cancer of right lung ([facility])   ? Dyslipidemia   ? History of radiation therapy   ? right chest 12/21/2020-12/24/2020  Dr Gery Pray  ? History of radiation therapy   ? right lung 06/07/2021-06/17/2021  Dr Gery Pray  ? Hypertension   ? Polymyalgia rheumatica ([facility])   ? ? ?ALLERGIES:  is  allergic to shellfish-derived products and augmentin [amoxicillin-pot clavulanate]. ? ?MEDICATIONS:  ?Current Outpatient Medications  ?Medication Sig Dispense Refill  ? senna-docusate (SENOKOT-S) 8.6-50 MG tablet Take 1 tablet by mouth 2 (two) times daily. 30 tablet 2  ? acetaminophen (TYLENOL) 650 MG CR tablet Take 1,300 mg by mouth 3 (three) times daily as needed for pain.    ? aspirin EC 81 MG tablet Take 81 mg by mouth 4 (four) times a week. Swallow whole.    ? atenolol (TENORMIN) 25 MG tablet Take 12.5 mg by mouth at bedtime.    ? Calcium-Vitamin D-Vitamin K (CALCIUM + D) [phone] MG-UNT-MCG CHEW     ? Cholecalciferol (VITAMIN D) 50 MCG (2000 UT) tablet     ? diclofenac Sodium (VOLTAREN) 1 % GEL Apply 4 g topically 4 (four) times daily. (Patient taking differently: Apply 4 g topically 4 (four) times daily as needed (pain).) 150 g 0  ? dronabinol (MARINOL) 5 MG capsule Take 1 capsule (5 mg total) by mouth 2 (two) times daily before a meal. 30 capsule 1  ? enoxaparin (LOVENOX) 60 MG/0.6ML injection Inject 0.6 mLs (60 mg total) into the skin daily. 18 mL 2  ? escitalopram (LEXAPRO) 20 MG tablet Take 20 mg by mouth at bedtime.    ? fexofenadine (ALLEGRA) 180 MG tablet Take 180 mg by mouth daily.    ? folic acid (FOLVITE) 1 MG tablet Take 1 tablet (1 mg total) by mouth daily. 30 tablet 2  ? mirtazapine (REMERON) 15 MG tablet Take 15 mg by mouth at bedtime.    ? morphine (MS CONTIN) 30 MG 12 hr tablet Take 1 tablet (30 mg total) by mouth every 12 (twelve) hours. 60 tablet 0  ? Multiple Vitamin (MULTI VITAMIN) TABS     ? ondansetron (ZOFRAN-ODT) 4 MG disintegrating tablet Take 4 mg by mouth 2 (two) times daily as needed for nausea or vomiting. (Patient not taking: Reported on 07/21/2021)    ? predniSONE (DELTASONE) 5 MG tablet Take 5 mg by mouth daily. (Patient not taking: Reported on 09/07/2021)    ? Salicylic Acid (SCALPICIN EX) Apply 1 application. topically daily as needed.    ? triamcinolone ointment (KENALOG)  0.1 % Apply 1 application. topically 2 (two) times daily.    ? ?No current facility-administered medications for this visit.  ? ? ?SURGICAL HISTORY: No past surgical history on file. ? ?REVIEW OF SYSTEMS:   ?Review of Systems  ?Constitutional: Negative for appetite change, chills, fatigue, fever and unexpected weight change.  ?HENT:   Negative for mouth sores, nosebleeds, sore throat and trouble swallowing.   ?Eyes: Negative for eye problems and icterus.  ?Respiratory: Negative for cough, hemoptysis, shortness of breath and wheezing.   ?Cardiovascular: Negative for chest pain and leg swelling.  ?Gastrointestinal: Negative for abdominal pain, constipation, diarrhea, nausea and vomiting.  ?Genitourinary: Negative for bladder incontinence, difficulty urinating, dysuria, frequency and hematuria.   ?Musculoskeletal: Negative for back pain, gait problem,  neck pain and neck stiffness.  ?Skin: Negative for itching and rash.  ?Neurological: Negative for dizziness, extremity weakness, gait problem, headaches, light-headedness and seizures.  ?Hematological: Negative for adenopathy. Does not bruise/bleed easily.  ?Psychiatric/Behavioral: Negative for confusion, depression and sleep disturbance. The patient is not nervous/anxious.   ? ? ?PHYSICAL EXAMINATION:  ?Blood pressure (!) 144/72, pulse 71, temperature 98.7 ?F (37.1 ?C), resp. rate 15, weight 91 lb 9.6 oz (41.5 kg), SpO2 96 %. ? ?ECOG PERFORMANCE STATUS: 1-2 ? ?Physical Exam  ?Constitutional: Oriented to person, place, and time and cachetic appearing female, and in no distress.  ?HENT:  ?Head: Normocephalic and atraumatic.  ?Mouth/Throat: Oropharynx is clear and moist. No oropharyngeal exudate.  ?Eyes: Conjunctivae are normal. Right eye exhibits no discharge. Left eye exhibits no discharge. No scleral icterus.  ?Neck: Normal range of motion. Neck supple.  ?Cardiovascular: Normal rate, regular rhythm, normal heart sounds and intact distal pulses.   ?Pulmonary/Chest: Effort  normal. Some expiratory wheezing noted bilaterally. No respiratory distress. No wheezes. No rales.  ?Abdominal: Soft. Bowel sounds are normal. Exhibits no distension and no mass. There is no tenderness.  ?Musculos

## 2021-09-28 ENCOUNTER — Inpatient Hospital Stay: Payer: Medicare HMO | Attending: Internal Medicine

## 2021-09-28 ENCOUNTER — Other Ambulatory Visit: Payer: Self-pay

## 2021-09-28 ENCOUNTER — Inpatient Hospital Stay (HOSPITAL_BASED_OUTPATIENT_CLINIC_OR_DEPARTMENT_OTHER): Payer: Medicare HMO | Admitting: Nurse Practitioner

## 2021-09-28 ENCOUNTER — Encounter: Payer: Self-pay | Admitting: Nurse Practitioner

## 2021-09-28 ENCOUNTER — Inpatient Hospital Stay (HOSPITAL_BASED_OUTPATIENT_CLINIC_OR_DEPARTMENT_OTHER): Payer: Medicare HMO | Admitting: Physician Assistant

## 2021-09-28 ENCOUNTER — Inpatient Hospital Stay: Payer: Medicare HMO

## 2021-09-28 VITALS — BP 144/72 | HR 71 | Temp 98.7°F | Resp 15 | Wt 91.6 lb

## 2021-09-28 DIAGNOSIS — I824Y1 Acute embolism and thrombosis of unspecified deep veins of right proximal lower extremity: Secondary | ICD-10-CM | POA: Diagnosis not present

## 2021-09-28 DIAGNOSIS — G893 Neoplasm related pain (acute) (chronic): Secondary | ICD-10-CM | POA: Diagnosis not present

## 2021-09-28 DIAGNOSIS — Z515 Encounter for palliative care: Secondary | ICD-10-CM | POA: Diagnosis not present

## 2021-09-28 DIAGNOSIS — Z87891 Personal history of nicotine dependence: Secondary | ICD-10-CM | POA: Insufficient documentation

## 2021-09-28 DIAGNOSIS — C7951 Secondary malignant neoplasm of bone: Secondary | ICD-10-CM | POA: Diagnosis not present

## 2021-09-28 DIAGNOSIS — C7971 Secondary malignant neoplasm of right adrenal gland: Secondary | ICD-10-CM | POA: Diagnosis not present

## 2021-09-28 DIAGNOSIS — C3491 Malignant neoplasm of unspecified part of right bronchus or lung: Secondary | ICD-10-CM | POA: Diagnosis not present

## 2021-09-28 DIAGNOSIS — K5903 Drug induced constipation: Secondary | ICD-10-CM | POA: Diagnosis not present

## 2021-09-28 DIAGNOSIS — Z5111 Encounter for antineoplastic chemotherapy: Secondary | ICD-10-CM | POA: Insufficient documentation

## 2021-09-28 DIAGNOSIS — C3411 Malignant neoplasm of upper lobe, right bronchus or lung: Secondary | ICD-10-CM | POA: Insufficient documentation

## 2021-09-28 LAB — CBC WITH DIFFERENTIAL (CANCER CENTER ONLY)
Abs Immature Granulocytes: 0.02 10*3/uL (ref 0.00–0.07)
Basophils Absolute: 0 10*3/uL (ref 0.0–0.1)
Basophils Relative: 1 %
Eosinophils Absolute: 0.2 10*3/uL (ref 0.0–0.5)
Eosinophils Relative: 3 %
HCT: 33.6 % — ABNORMAL LOW (ref 36.0–46.0)
Hemoglobin: 11.3 g/dL — ABNORMAL LOW (ref 12.0–15.0)
Immature Granulocytes: 0 %
Lymphocytes Relative: 20 %
Lymphs Abs: 1 10*3/uL (ref 0.7–4.0)
MCH: 34.3 pg — ABNORMAL HIGH (ref 26.0–34.0)
MCHC: 33.6 g/dL (ref 30.0–36.0)
MCV: 102.1 fL — ABNORMAL HIGH (ref 80.0–100.0)
Monocytes Absolute: 0.8 10*3/uL (ref 0.1–1.0)
Monocytes Relative: 15 %
Neutro Abs: 3.2 10*3/uL (ref 1.7–7.7)
Neutrophils Relative %: 61 %
Platelet Count: 234 10*3/uL (ref 150–400)
RBC: 3.29 MIL/uL — ABNORMAL LOW (ref 3.87–5.11)
RDW: 14.6 % (ref 11.5–15.5)
WBC Count: 5.2 10*3/uL (ref 4.0–10.5)
nRBC: 0 % (ref 0.0–0.2)

## 2021-09-28 LAB — CMP (CANCER CENTER ONLY)
ALT: 11 U/L (ref 0–44)
AST: 19 U/L (ref 15–41)
Albumin: 3.7 g/dL (ref 3.5–5.0)
Alkaline Phosphatase: 53 U/L (ref 38–126)
Anion gap: 6 (ref 5–15)
BUN: 18 mg/dL (ref 8–23)
CO2: 31 mmol/L (ref 22–32)
Calcium: 9.5 mg/dL (ref 8.9–10.3)
Chloride: 102 mmol/L (ref 98–111)
Creatinine: 0.66 mg/dL (ref 0.44–1.00)
GFR, Estimated: >60 mL/min (ref >60)
Glucose, Bld: 131 mg/dL — ABNORMAL HIGH (ref 70–99)
Potassium: 3.5 mmol/L (ref 3.5–5.1)
Sodium: 139 mmol/L (ref 135–145)
Total Bilirubin: 0.5 mg/dL (ref 0.3–1.2)
Total Protein: 6.8 g/dL (ref 6.5–8.1)

## 2021-09-28 MED ORDER — SENNOSIDES-DOCUSATE SODIUM 8.6-50 MG PO TABS: 1.0000 | ORAL_TABLET | Freq: Two times a day (BID) | ORAL | 2 refills | Status: DC

## 2021-09-28 MED ORDER — SODIUM CHLORIDE 0.9 % IV SOLN
500.0000 mg/m2 | Freq: Once | INTRAVENOUS | Status: AC
  Administered 2021-09-28: 700 mg via INTRAVENOUS
  Filled 2021-09-28: qty 20

## 2021-09-28 MED ORDER — ENOXAPARIN SODIUM 60 MG/0.6ML IJ SOSY: 60.0000 mg | PREFILLED_SYRINGE | INTRAMUSCULAR | 2 refills | Status: DC

## 2021-09-28 MED ORDER — SODIUM CHLORIDE 0.9 % IV SOLN: Freq: Once | INTRAVENOUS | Status: AC

## 2021-09-28 NOTE — Progress Notes (Signed)
? ?  ?Palliative Medicine ?[facility]  ?[phone] [phone] ? ? ?Name: Kristi Jennings ?Date: 09/28/2021 ?MRN: [id number]  ?DOB: [birth year] ? ?Patient Care Team: ?[name], NP as PCP - General (Family Medicine) ?[place] as Registered Nurse (Hospice and Palliative Medicine)  ? ? ?INTERVAL HISTORY: ?Kristi Jennings is a 76 y.o. 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 Palliative ask to see for symptom management. ? ?SOCIAL HISTORY:    ? reports that she quit smoking about 12 years ago. Her smoking use included cigarettes. She has a 43.00 pack-year smoking history. She has never used smokeless tobacco. She reports that she does not currently use alcohol. She reports that she does not use drugs. ? ?ADVANCE DIRECTIVES:  ?None on file  ? ?CODE STATUS:  ? ?PAST MEDICAL HISTORY: ?Past Medical History:  ?Diagnosis Date  ? Borderline diabetes mellitus   ? Bronchogenic cancer of right lung ([place])   ? Dyslipidemia   ? History of radiation therapy   ? right chest 12/21/2020-12/24/2020  Dr Gery Pray  ? History of radiation therapy   ? right lung 06/07/2021-06/17/2021  Dr Gery Pray  ? Hypertension   ? Polymyalgia rheumatica ([place])   ? ?ALLERGIES:  is allergic to shellfish-derived products and augmentin [amoxicillin-pot clavulanate]. ? ?MEDICATIONS:  ?Current Outpatient Medications  ?Medication Sig Dispense Refill  ? acetaminophen (TYLENOL) 650 MG CR tablet Take 1,300 mg by mouth 3 (three) times daily as needed for pain.    ? aspirin EC 81 MG tablet Take 81 mg by mouth 4 (four) times a week. Swallow whole.    ? atenolol (TENORMIN) 25 MG tablet Take 12.5 mg by mouth at bedtime.    ? Calcium-Vitamin D-Vitamin K (CALCIUM + D) [phone] MG-UNT-MCG CHEW     ? Cholecalciferol (VITAMIN D) 50 MCG (2000 UT) tablet     ? diclofenac Sodium (VOLTAREN) 1 % GEL Apply 4 g topically 4  (four) times daily. (Patient taking differently: Apply 4 g topically 4 (four) times daily as needed (pain).) 150 g 0  ? dronabinol (MARINOL) 5 MG capsule Take 1 capsule (5 mg total) by mouth 2 (two) times daily before a meal. 30 capsule 1  ? enoxaparin (LOVENOX) 60 MG/0.6ML injection Inject 0.6 mLs (60 mg total) into the skin daily. 18 mL 2  ? escitalopram (LEXAPRO) 20 MG tablet Take 20 mg by mouth at bedtime.    ? fexofenadine (ALLEGRA) 180 MG tablet Take 180 mg by mouth daily.    ? folic acid (FOLVITE) 1 MG tablet Take 1 tablet (1 mg total) by mouth daily. 30 tablet 2  ? mirtazapine (REMERON) 15 MG tablet Take 15 mg by mouth at bedtime.    ? morphine (MS CONTIN) 30 MG 12 hr tablet Take 1 tablet (30 mg total) by mouth every 12 (twelve) hours. 60 tablet 0  ? Multiple Vitamin (MULTI VITAMIN) TABS     ? ondansetron (ZOFRAN-ODT) 4 MG disintegrating tablet Take 4 mg by mouth 2 (two) times daily as needed for nausea or vomiting. (Patient not taking: Reported on 07/21/2021)    ? predniSONE (DELTASONE) 5 MG tablet Take 5 mg by mouth daily. (Patient not taking: Reported on 09/07/2021)    ? Salicylic Acid (SCALPICIN EX) Apply 1 application. topically daily as needed.    ? senna-docusate (SENOKOT-S) 8.6-50 MG tablet Take 1 tablet by mouth 2 (two) times  daily. 30 tablet 2  ? triamcinolone ointment (KENALOG) 0.1 % Apply 1 application. topically 2 (two) times daily.    ? ?No current facility-administered medications for this visit.  ? ? ?VITAL SIGNS: ?There were no vitals taken for this visit. ?There were no vitals filed for this visit. ?  ?Estimated body mass index is 15.24 kg/m? as calculated from the following: ?  Height as of 09/07/21: 5\' 5"  (1.651 m). ?  Weight as of an earlier encounter on 09/28/21: 91 lb 9.6 oz (41.5 kg). ? ?PERFORMANCE STATUS (ECOG) : 1 - Symptomatic but completely ambulatory ? ?Physical Exam ?General: NAD, ambulatory ?Neurological: AAO x4, mood appropriate  ? ?IMPRESSION: ? ?Mrs. Rainford here today for  follow-up with Oncology team and infusion. No acute distress noted. Continues to appreciate her improvement and good quality of life. Pain is much improved.  ? ?Pain ?Mrs. Alberty's pain is much improved. She continues to take MS Contin twice daily. No longer requiring dilaudid for breakthrough pain. Activity level continues to improve. Will wean MS Contin down to 15mg  at next refill. Will continue to monitor closely.  ? ?Constipation ?No concerns with constipation.  Continues to take MiraLAX and senna as needed for bowel regimen. ? ?Anxiety ?Her anxiety has decreased significantly mainly due to to her improved wellbeing and decreased pain.  She states she is focusing on continued improvement and hopes things remain stable.  ? ?Decreased appetite/Weight loss ?Appetite is improving. Able to eat foods that she enjoy and snacking throughout the day.  ? ?Her weight today is is up to 91 lbs from 89 lbs 4/19,  88.8 lbs on 3/29, 90 lbs on 3/8,  88lbs 2/15, 89lbs on 1/25 and 92lb on 1/11. Continue with dronabinol. Will continue to monitor.  ? ?Overall patient is doing much better and symptom burden has drastically decreased and almost resolved..  We will continue to support and follow as needed. ? ?PLAN: ?She is very much improved. ?Continue MS Contin  every 12 hours (we will plan to wean to 15mg  at next refill with close monitoring) ?Patient is not requiring breakthrough medication. Much improved. ?Dronabinol 5 mg twice daily for appetite ?Ongoing symptom management (see above) ?I will plan to see patient back in 4-6 weeks in collaboration with her other appointments.  ? ? ?Patient expressed understanding and was in agreement with this plan. She also understands that She can call the clinic at any time with any questions, concerns, or complaints.  ? ?Time Total: 15 min ? ?Visit consisted of counseling and education dealing with the complex and emotionally intense issues of symptom management and palliative care in the  setting of serious and potentially life-threatening illness.Greater than 50%  of this time was spent counseling and coordinating care related to the above assessment and plan. ? ?Alda Lea, AGPCNP-BC  ?[facility] ? ? ? ? ? ? ? ? ? ? ?

## 2021-09-28 NOTE — Patient Instructions (Signed)
[facility]  Discharge Instructions: ?Thank you for choosing [facility] to provide your oncology and hematology care.  ? ?If you have a lab appointment with the [facility], please go directly to the [facility] and check in at the registration area. ?  ?Wear comfortable clothing and clothing appropriate for easy access to any Portacath or PICC line.  ? ?We strive to give you quality time with your provider. You may need to reschedule your appointment if you arrive late (15 or more minutes).  Arriving late affects you and other patients whose appointments are after yours.  Also, if you miss three or more appointments without notifying the office, you may be dismissed from the clinic at the provider?s discretion.    ?  ?For prescription refill requests, have your pharmacy contact our office and allow 72 hours for refills to be completed.   ? ?Today you received the following chemotherapy and/or immunotherapy agents: Alimta    ?  ?To help prevent nausea and vomiting after your treatment, we encourage you to take your nausea medication as directed. ? ?BELOW ARE SYMPTOMS THAT SHOULD BE REPORTED IMMEDIATELY: ?*FEVER GREATER THAN 100.4 F (38 ?C) OR HIGHER ?*CHILLS OR SWEATING ?*NAUSEA AND VOMITING THAT IS NOT CONTROLLED WITH YOUR NAUSEA MEDICATION ?*UNUSUAL SHORTNESS OF BREATH ?*UNUSUAL BRUISING OR BLEEDING ?*URINARY PROBLEMS (pain or burning when urinating, or frequent urination) ?*BOWEL PROBLEMS (unusual diarrhea, constipation, pain near the anus) ?TENDERNESS IN MOUTH AND THROAT WITH OR WITHOUT PRESENCE OF ULCERS (sore throat, sores in mouth, or a toothache) ?UNUSUAL RASH, SWELLING OR PAIN  ?UNUSUAL VAGINAL DISCHARGE OR ITCHING  ? ?Items with * indicate a potential emergency and should be followed up as soon as possible or go to the Emergency Department if any problems should occur. ? ?Please show the CHEMOTHERAPY ALERT CARD or IMMUNOTHERAPY ALERT CARD at check-in to the  Emergency Department and triage nurse. ? ?Should you have questions after your visit or need to cancel or reschedule your appointment, please contact [facility]  Dept: [phone]  and follow the prompts.  Office hours are 8:00 a.m. to 4:30 p.m. Monday - Friday. Please note that voicemails left after 4:00 p.m. may not be returned until the following business day.  We are closed weekends and major holidays. You have access to a nurse at all times for urgent questions. Please call the main number to the clinic Dept: [phone] and follow the prompts. ? ? ?For any non-urgent questions, you may also contact your provider using MyChart. We now offer e-Visits for anyone 75 and older to request care online for non-urgent symptoms. For details visit [email or web address]. ?  ?Also download the MyChart app! Go to the app store, search "MyChart", open the app, select [facility], and log in with your MyChart username and password. ? ?Due to Covid, a mask is required upon entering the hospital/clinic. If you do not have a mask, one will be given to you upon arrival. For doctor visits, patients may have 1 support person aged 75 or older with them. For treatment visits, patients cannot have anyone with them due to current Covid guidelines and our immunocompromised population.  ? ?

## 2021-10-03 ENCOUNTER — Encounter: Payer: Self-pay | Admitting: Internal Medicine

## 2021-10-03 ENCOUNTER — Encounter: Payer: Self-pay | Admitting: Physician Assistant

## 2021-10-06 DIAGNOSIS — C3491 Malignant neoplasm of unspecified part of right bronchus or lung: Secondary | ICD-10-CM | POA: Diagnosis not present

## 2021-10-10 ENCOUNTER — Other Ambulatory Visit: Payer: Self-pay | Admitting: Physician Assistant

## 2021-10-10 DIAGNOSIS — C3491 Malignant neoplasm of unspecified part of right bronchus or lung: Secondary | ICD-10-CM

## 2021-10-11 ENCOUNTER — Ambulatory Visit (INDEPENDENT_AMBULATORY_CARE_PROVIDER_SITE_OTHER): Payer: Medicare HMO | Admitting: Family Medicine

## 2021-10-11 ENCOUNTER — Encounter: Payer: Self-pay | Admitting: Family Medicine

## 2021-10-11 VITALS — BP 125/76 | HR 73 | Temp 97.6°F | Resp 16 | Ht 66.0 in | Wt 88.8 lb

## 2021-10-11 DIAGNOSIS — F419 Anxiety disorder, unspecified: Secondary | ICD-10-CM | POA: Diagnosis not present

## 2021-10-11 DIAGNOSIS — R69 Illness, unspecified: Secondary | ICD-10-CM | POA: Diagnosis not present

## 2021-10-11 DIAGNOSIS — R7989 Other specified abnormal findings of blood chemistry: Secondary | ICD-10-CM

## 2021-10-11 DIAGNOSIS — I10 Essential (primary) hypertension: Secondary | ICD-10-CM | POA: Diagnosis not present

## 2021-10-11 DIAGNOSIS — Z1231 Encounter for screening mammogram for malignant neoplasm of breast: Secondary | ICD-10-CM

## 2021-10-11 LAB — LIPID PANEL
Cholesterol: 188 mg/dL (ref 0–200)
HDL: 59.9 mg/dL (ref >39.00)
LDL Cholesterol: 102 mg/dL — ABNORMAL HIGH (ref 0–99)
NonHDL: 128.21
Total CHOL/HDL Ratio: 3
Triglycerides: 129 mg/dL (ref 0.0–149.0)
VLDL: 25.8 mg/dL (ref 0.0–40.0)

## 2021-10-11 LAB — T4, FREE: Free T4: 0.99 ng/dL (ref 0.60–1.60)

## 2021-10-11 LAB — TSH: TSH: 6.71 u[IU]/mL — ABNORMAL HIGH (ref 0.35–5.50)

## 2021-10-11 NOTE — Patient Instructions (Signed)
Glad you are doing well! No changes today. We will update your labs and let you know results as they come in.

## 2021-10-11 NOTE — Progress Notes (Signed)
Established Patient Office Visit  Subjective   Patient ID: Kristi Jennings, female    DOB: 09-Aug-1945  Age: 76 y.o. MRN: [id number]  Chief Complaint  Patient presents with   Follow-up    Follow Up    HPI  Patient is here for routine f/u. Reports that she is doing well overall with no major concerns. States that her seasonal allergies have been flaring up the past few weeks, but no major symptoms and she is managing fine at home.   She continues to follow with palliative at chemo treatments every 3 weeks. States "everything is status quo" and denies any current concerns. She is still working on gaining weight.   States her mood has been good overall on the Lexapro. No new concerns. She feels like being able to be 'out and about' lately has been helping her mood significantly.   Reports HTN is well controlled and she denies any chest pain, palpitations, dyspnea, wheezing, edema, recurrent headaches, vision changes.        ROS All review of systems negative except what is listed in the HPI    Objective:     BP 125/76   Pulse 73   Temp 97.6 F (36.4 C) (Oral)   Resp 16   Ht 5\' 6"  (1.676 m)   Wt 88 lb 12.8 oz (40.3 kg)   SpO2 100%   BMI 14.33 kg/m    Physical Exam Vitals reviewed.  Constitutional:      General: She is not in acute distress.    Appearance: Normal appearance. She is not ill-appearing.  Cardiovascular:     Rate and Rhythm: Normal rate and regular rhythm.  Pulmonary:     Effort: Pulmonary effort is normal.     Breath sounds: Normal breath sounds.  Musculoskeletal:     Cervical back: Normal range of motion and neck supple. No tenderness.  Lymphadenopathy:     Cervical: No cervical adenopathy.  Skin:    General: Skin is warm and dry.  Neurological:     General: No focal deficit present.     Mental Status: She is alert and oriented to person, place, and time. Mental status is at baseline.  Psychiatric:        Mood and Affect: Mood normal.         Behavior: Behavior normal.        Thought Content: Thought content normal.        Judgment: Judgment normal.     No results found for any visits on 10/11/21.    The 10-year ASCVD risk score (Arnett DK, et al., 2019) is: 31.3%    Assessment & Plan:   1. Benign essential hypertension Blood pressure is at goal for age and co-morbidities.  I recommend continue atenolol and lifestyle factors.   - BP goal <130/80 - monitor and log blood pressures at home - check around the same time each day in a relaxed setting - Limit salt to <2000 mg/day - Follow DASH eating plan (heart healthy diet) - limit alcohol to 2 standard drinks per day for men and 1 per day for women - avoid tobacco products - get at least 2 hours of regular aerobic exercise weekly Patient aware of signs/symptoms requiring further/urgent evaluation. Labs updated 2 weeks ago, adding lipid panel today. - Lipid panel  2. Anxiety Stable. No new concerns. Continue Lexapro. No SI/HI.  3. Elevated TSH Labs in early March done elsewhere revealed elevated TSH. Patient states she was never  aware of this or any further workup. Will recheck today. She denies any new symptoms.  - T4, free - TSH  4. Encounter for screening mammogram for malignant neoplasm of breast - MM DIGITAL SCREENING BILATERAL; Future   Return in about 6 months (around 04/13/2022) for routine f/u or CPE.    [name], NP

## 2021-10-12 DIAGNOSIS — C3491 Malignant neoplasm of unspecified part of right bronchus or lung: Secondary | ICD-10-CM | POA: Diagnosis not present

## 2021-10-12 NOTE — Progress Notes (Signed)
Your TSH level is still high, but your T4 (hormone) is normal so we would call this subclinical and just continue to monitor. No treatment needed at this time. Cholesterol is stable.

## 2021-10-18 ENCOUNTER — Other Ambulatory Visit: Payer: Self-pay

## 2021-10-18 ENCOUNTER — Inpatient Hospital Stay (HOSPITAL_BASED_OUTPATIENT_CLINIC_OR_DEPARTMENT_OTHER): Payer: Medicare HMO | Admitting: Internal Medicine

## 2021-10-18 ENCOUNTER — Encounter: Payer: Self-pay | Admitting: Nurse Practitioner

## 2021-10-18 ENCOUNTER — Encounter: Payer: Self-pay | Admitting: Internal Medicine

## 2021-10-18 ENCOUNTER — Inpatient Hospital Stay: Payer: Medicare HMO

## 2021-10-18 ENCOUNTER — Inpatient Hospital Stay (HOSPITAL_BASED_OUTPATIENT_CLINIC_OR_DEPARTMENT_OTHER): Payer: Medicare HMO | Admitting: Nurse Practitioner

## 2021-10-18 VITALS — BP 127/72 | HR 69 | Temp 97.8°F | Resp 15 | Wt 91.6 lb

## 2021-10-18 DIAGNOSIS — R634 Abnormal weight loss: Secondary | ICD-10-CM | POA: Diagnosis not present

## 2021-10-18 DIAGNOSIS — Z87891 Personal history of nicotine dependence: Secondary | ICD-10-CM | POA: Diagnosis not present

## 2021-10-18 DIAGNOSIS — C7971 Secondary malignant neoplasm of right adrenal gland: Secondary | ICD-10-CM | POA: Diagnosis not present

## 2021-10-18 DIAGNOSIS — G893 Neoplasm related pain (acute) (chronic): Secondary | ICD-10-CM

## 2021-10-18 DIAGNOSIS — C3491 Malignant neoplasm of unspecified part of right bronchus or lung: Secondary | ICD-10-CM

## 2021-10-18 DIAGNOSIS — Z5111 Encounter for antineoplastic chemotherapy: Secondary | ICD-10-CM

## 2021-10-18 DIAGNOSIS — C3490 Malignant neoplasm of unspecified part of unspecified bronchus or lung: Secondary | ICD-10-CM

## 2021-10-18 DIAGNOSIS — Z515 Encounter for palliative care: Secondary | ICD-10-CM | POA: Diagnosis not present

## 2021-10-18 DIAGNOSIS — K5903 Drug induced constipation: Secondary | ICD-10-CM

## 2021-10-18 DIAGNOSIS — C7951 Secondary malignant neoplasm of bone: Secondary | ICD-10-CM | POA: Diagnosis not present

## 2021-10-18 DIAGNOSIS — C3411 Malignant neoplasm of upper lobe, right bronchus or lung: Secondary | ICD-10-CM | POA: Diagnosis not present

## 2021-10-18 LAB — CBC WITH DIFFERENTIAL (CANCER CENTER ONLY)
Abs Immature Granulocytes: 0.01 10*3/uL (ref 0.00–0.07)
Basophils Absolute: 0 10*3/uL (ref 0.0–0.1)
Basophils Relative: 1 %
Eosinophils Absolute: 0.1 10*3/uL (ref 0.0–0.5)
Eosinophils Relative: 3 %
HCT: 33.6 % — ABNORMAL LOW (ref 36.0–46.0)
Hemoglobin: 11 g/dL — ABNORMAL LOW (ref 12.0–15.0)
Immature Granulocytes: 0 %
Lymphocytes Relative: 26 %
Lymphs Abs: 1.2 10*3/uL (ref 0.7–4.0)
MCH: 33.5 pg (ref 26.0–34.0)
MCHC: 32.7 g/dL (ref 30.0–36.0)
MCV: 102.4 fL — ABNORMAL HIGH (ref 80.0–100.0)
Monocytes Absolute: 0.7 10*3/uL (ref 0.1–1.0)
Monocytes Relative: 15 %
Neutro Abs: 2.6 10*3/uL (ref 1.7–7.7)
Neutrophils Relative %: 55 %
Platelet Count: 280 10*3/uL (ref 150–400)
RBC: 3.28 MIL/uL — ABNORMAL LOW (ref 3.87–5.11)
RDW: 14.5 % (ref 11.5–15.5)
WBC Count: 4.7 10*3/uL (ref 4.0–10.5)
nRBC: 0 % (ref 0.0–0.2)

## 2021-10-18 LAB — CMP (CANCER CENTER ONLY)
ALT: 8 U/L (ref 0–44)
AST: 15 U/L (ref 15–41)
Albumin: 3.6 g/dL (ref 3.5–5.0)
Alkaline Phosphatase: 53 U/L (ref 38–126)
Anion gap: 5 (ref 5–15)
BUN: 11 mg/dL (ref 8–23)
CO2: 34 mmol/L — ABNORMAL HIGH (ref 22–32)
Calcium: 9.3 mg/dL (ref 8.9–10.3)
Chloride: 102 mmol/L (ref 98–111)
Creatinine: 0.69 mg/dL (ref 0.44–1.00)
GFR, Estimated: >60 mL/min (ref >60)
Glucose, Bld: 97 mg/dL (ref 70–99)
Potassium: 3.4 mmol/L — ABNORMAL LOW (ref 3.5–5.1)
Sodium: 141 mmol/L (ref 135–145)
Total Bilirubin: 0.4 mg/dL (ref 0.3–1.2)
Total Protein: 6.6 g/dL (ref 6.5–8.1)

## 2021-10-18 MED ORDER — DRONABINOL 5 MG PO CAPS: 5.0000 mg | ORAL_CAPSULE | Freq: Two times a day (BID) | ORAL | 1 refills | Status: DC

## 2021-10-18 MED ORDER — MORPHINE SULFATE ER 15 MG PO TBCR: 30.0000 mg | EXTENDED_RELEASE_TABLET | Freq: Two times a day (BID) | ORAL | 0 refills | Status: DC

## 2021-10-18 MED ORDER — SODIUM CHLORIDE 0.9 % IV SOLN
500.0000 mg/m2 | Freq: Once | INTRAVENOUS | Status: AC
  Administered 2021-10-18: 700 mg via INTRAVENOUS
  Filled 2021-10-18: qty 8

## 2021-10-18 MED ORDER — SODIUM CHLORIDE 0.9 % IV SOLN: Freq: Once | INTRAVENOUS | Status: AC

## 2021-10-18 MED ORDER — ZOLEDRONIC ACID 4 MG/5ML IV CONC
3.5000 mg | Freq: Once | INTRAVENOUS | Status: DC
  Filled 2021-10-18: qty 4.38

## 2021-10-18 NOTE — Progress Notes (Signed)
Per Dr Julien Nordmann, pt expected to receive zometa every 6 weeks.  Last dose 07/27/21.  Pt stated she does not have dental clearance.  Dr Julien Nordmann notified.  Will hold drug until pt receives dental clearance.  Form given to patient to fill out and return to office.  Pt verbalized understanding.

## 2021-10-18 NOTE — Patient Instructions (Signed)
[facility] ONCOLOGY  Discharge Instructions: Thank you for choosing [facility] to provide your oncology and hematology care.   If you have a lab appointment with the [facility], please go directly to the [facility] and check in at the registration area.   Wear comfortable clothing and clothing appropriate for easy access to any Portacath or PICC line.   We strive to give you quality time with your provider. You may need to reschedule your appointment if you arrive late (15 or more minutes).  Arriving late affects you and other patients whose appointments are after yours.  Also, if you miss three or more appointments without notifying the office, you may be dismissed from the clinic at the provider's discretion.      For prescription refill requests, have your pharmacy contact our office and allow 72 hours for refills to be completed.    Today you received the following chemotherapy and/or immunotherapy agents: Alimta      To help prevent nausea and vomiting after your treatment, we encourage you to take your nausea medication as directed.  BELOW ARE SYMPTOMS THAT SHOULD BE REPORTED IMMEDIATELY: *FEVER GREATER THAN 100.4 F (38 C) OR HIGHER *CHILLS OR SWEATING *NAUSEA AND VOMITING THAT IS NOT CONTROLLED WITH YOUR NAUSEA MEDICATION *UNUSUAL SHORTNESS OF BREATH *UNUSUAL BRUISING OR BLEEDING *URINARY PROBLEMS (pain or burning when urinating, or frequent urination) *BOWEL PROBLEMS (unusual diarrhea, constipation, pain near the anus) TENDERNESS IN MOUTH AND THROAT WITH OR WITHOUT PRESENCE OF ULCERS (sore throat, sores in mouth, or a toothache) UNUSUAL RASH, SWELLING OR PAIN  UNUSUAL VAGINAL DISCHARGE OR ITCHING   Items with * indicate a potential emergency and should be followed up as soon as possible or go to the Emergency Department if any problems should occur.  Please show the CHEMOTHERAPY ALERT CARD or IMMUNOTHERAPY ALERT CARD at check-in to the  Emergency Department and triage nurse.  Should you have questions after your visit or need to cancel or reschedule your appointment, please contact [facility]  Dept: [phone]  and follow the prompts.  Office hours are 8:00 a.m. to 4:30 p.m. Monday - Friday. Please note that voicemails left after 4:00 p.m. may not be returned until the following business day.  We are closed weekends and major holidays. You have access to a nurse at all times for urgent questions. Please call the main number to the clinic Dept: [phone] and follow the prompts.   For any non-urgent questions, you may also contact your provider using MyChart. We now offer e-Visits for anyone 26 and older to request care online for non-urgent symptoms. For details visit [email or web address].   Also download the MyChart app! Go to the app store, search "MyChart", open the app, select [facility], and log in with your MyChart username and password.  Due to Covid, a mask is required upon entering the hospital/clinic. If you do not have a mask, one will be given to you upon arrival. For doctor visits, patients may have 1 support person aged 75 or older with them. For treatment visits, patients cannot have anyone with them due to current Covid guidelines and our immunocompromised populatioPemetrexed injection What is this medication? PEMETREXED (PEM e TREX ed) is a chemotherapy drug used to treat lung cancers like non-small cell lung cancer and mesothelioma. It may also be used to treat other cancers. This medicine may be used for other purposes; ask your health care provider or pharmacist if  you have questions. COMMON BRAND NAME(S): Alimta, PEMFEXY What should I tell my care team before I take this medication? They need to know if you have any of these conditions: infection (especially a virus infection such as chickenpox, cold sores, or herpes) kidney disease low blood counts, like low white cell,  platelet, or red cell counts lung or breathing disease, like asthma radiation therapy an unusual or allergic reaction to pemetrexed, other medicines, foods, dyes, or preservative pregnant or trying to get pregnant breast-feeding How should I use this medication? This drug is given as an infusion into a vein. It is administered in a hospital or clinic by a specially trained health care professional. Talk to your pediatrician regarding the use of this medicine in children. Special care may be needed. Overdosage: If you think you have taken too much of this medicine contact a poison control center or emergency room at once. NOTE: This medicine is only for you. Do not share this medicine with others. What if I miss a dose? It is important not to miss your dose. Call your doctor or health care professional if you are unable to keep an appointment. What may interact with this medication? This medicine may interact with the following medications: Ibuprofen This list may not describe all possible interactions. Give your health care provider a list of all the medicines, herbs, non-prescription drugs, or dietary supplements you use. Also tell them if you smoke, drink alcohol, or use illegal drugs. Some items may interact with your medicine. What should I watch for while using this medication? Visit your doctor for checks on your progress. This drug may make you feel generally unwell. This is not uncommon, as chemotherapy can affect healthy cells as well as cancer cells. Report any side effects. Continue your course of treatment even though you feel ill unless your doctor tells you to stop. In some cases, you may be given additional medicines to help with side effects. Follow all directions for their use. Call your doctor or health care professional for advice if you get a fever, chills or sore throat, or other symptoms of a cold or flu. Do not treat yourself. This drug decreases your body's ability to  fight infections. Try to avoid being around people who are sick. This medicine may increase your risk to bruise or bleed. Call your doctor or health care professional if you notice any unusual bleeding. Be careful brushing and flossing your teeth or using a toothpick because you may get an infection or bleed more easily. If you have any dental work done, tell your dentist you are receiving this medicine. Avoid taking products that contain aspirin, acetaminophen, ibuprofen, naproxen, or ketoprofen unless instructed by your doctor. These medicines may hide a fever. Call your doctor or health care professional if you get diarrhea or mouth sores. Do not treat yourself. To protect your kidneys, drink water or other fluids as directed while you are taking this medicine. Do not become pregnant while taking this medicine or for 6 months after stopping it. Women should inform their doctor if they wish to become pregnant or think they might be pregnant. Men should not father a child while taking this medicine and for 3 months after stopping it. This may interfere with the ability to father a child. You should talk to your doctor or health care professional if you are concerned about your fertility. There is a potential for serious side effects to an unborn child. Talk to your health care professional  or pharmacist for more information. Do not breast-feed an infant while taking this medicine or for 1 week after stopping it. What side effects may I notice from receiving this medication? Side effects that you should report to your doctor or health care professional as soon as possible: allergic reactions like skin rash, itching or hives, swelling of the face, lips, or tongue breathing problems redness, blistering, peeling or loosening of the skin, including inside the mouth signs and symptoms of bleeding such as bloody or black, tarry stools; red or dark-brown urine; spitting up blood or brown material that looks  like coffee grounds; red spots on the skin; unusual bruising or bleeding from the eye, gums, or nose signs and symptoms of infection like fever or chills; cough; sore throat; pain or trouble passing urine signs and symptoms of kidney injury like trouble passing urine or change in the amount of urine signs and symptoms of liver injury like dark yellow or brown urine; general ill feeling or flu-like symptoms; light-colored stools; loss of appetite; nausea; right upper belly pain; unusually weak or tired; yellowing of the eyes or skin Side effects that usually do not require medical attention (report to your doctor or health care professional if they continue or are bothersome): constipation mouth sores nausea, vomiting unusually weak or tired This list may not describe all possible side effects. Call your doctor for medical advice about side effects. You may report side effects to FDA at 1-800-FDA-1088. Where should I keep my medication? This drug is given in a hospital or clinic and will not be stored at home. NOTE: This sheet is a summary. It may not cover all possible information. If you have questions about this medicine, talk to your doctor, pharmacist, or health care provider.  2023 Elsevier/Gold Standard (2017-07-03 00:00:00)

## 2021-10-18 NOTE — Progress Notes (Signed)
[place]  [phone] [phone]) [phone]   Name: Kristi Jennings Date: 10/18/2021 MRN: [id number]  DOB: [birth year]  Patient Care Team: [name], NP as PCP - General (Family Medicine) [place] Registered Nurse [facility] and Palliative Medicine)    INTERVAL HISTORY: Kristi Jennings is a 76 y.o. 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 Palliative ask to see for symptom management.  SOCIAL HISTORY:     reports that Kristi Jennings quit smoking about 12 years ago. Kristi Jennings smoking use included cigarettes. Kristi Jennings has a 43.00 pack-year smoking history. Kristi Jennings has never used smokeless tobacco. Kristi Jennings reports that Kristi Jennings does not currently use alcohol. Kristi Jennings reports that Kristi Jennings does not use drugs.  ADVANCE DIRECTIVES:  None on file   CODE STATUS:   PAST MEDICAL HISTORY: Past Medical History:  Diagnosis Date   Borderline diabetes mellitus    Bronchogenic cancer of right lung ([place])    Dyslipidemia    History of radiation therapy    right chest 12/21/2020-12/24/2020  Dr Gery Pray   History of radiation therapy    right lung 06/07/2021-06/17/2021  Dr Gery Pray   Hypertension    Polymyalgia rheumatica ([place])    ALLERGIES:  is allergic to shellfish-derived products and augmentin [amoxicillin-pot clavulanate].  MEDICATIONS:  Current Outpatient Medications  Medication Sig Dispense Refill   acetaminophen (TYLENOL) 650 MG CR tablet Take 1,300 mg by mouth 3 (three) times daily as needed for pain.     aspirin EC 81 MG tablet Take 81 mg by mouth 4 (four) times a week. Swallow whole.     atenolol (TENORMIN) 25 MG tablet Take 12.5 mg by mouth at bedtime.     Calcium-Vitamin D-Vitamin K (CALCIUM + D) [phone] MG-UNT-MCG CHEW      Cholecalciferol (VITAMIN D) 50 MCG (2000 UT) tablet      diclofenac Sodium (VOLTAREN) 1 % GEL Apply 4 g topically 4  (four) times daily. (Patient taking differently: Apply 4 g topically 4 (four) times daily as needed (pain).) 150 g 0   dronabinol (MARINOL) 5 MG capsule Take 1 capsule (5 mg total) by mouth 2 (two) times daily before a meal. 60 capsule 1   enoxaparin (LOVENOX) 60 MG/0.6ML injection Inject 0.6 mLs (60 mg total) into the skin daily. 18 mL 2   escitalopram (LEXAPRO) 20 MG tablet Take 20 mg by mouth at bedtime.     fexofenadine (ALLEGRA) 180 MG tablet Take 180 mg by mouth daily.     fluocinonide (LIDEX) 0.05 % external solution Apply topically daily.     folic acid (FOLVITE) 1 MG tablet TAKE ONE TABLET BY MOUTH ONE TIME DAILY 30 tablet 2   mirtazapine (REMERON) 15 MG tablet Take 15 mg by mouth at bedtime.     morphine (MS CONTIN) 15 MG 12 hr tablet Take 2 tablets (30 mg total) by mouth every 12 (twelve) hours. 60 tablet 0   Multiple Vitamin (MULTI VITAMIN) TABS      ondansetron (ZOFRAN-ODT) 4 MG disintegrating tablet Take 4 mg by mouth 2 (two) times daily as needed for nausea or vomiting. (Patient not taking: Reported on 07/21/2021)     predniSONE (DELTASONE) 1 MG tablet Take 4 mg by mouth daily.     predniSONE (DELTASONE) 5 MG tablet Take 5 mg by mouth daily. (Patient not taking: Reported on 08/18/760)     Salicylic  Acid (SCALPICIN EX) Apply 1 application. topically daily as needed.     senna-docusate (SENOKOT-S) 8.6-50 MG tablet Take 1 tablet by mouth 2 (two) times daily. 30 tablet 2   triamcinolone ointment (KENALOG) 0.1 % Apply 1 application. topically 2 (two) times daily.     No current facility-administered medications for this visit.    VITAL SIGNS: There were no vitals taken for this visit. There were no vitals filed for this visit.   Estimated body mass index is 14.78 kg/m as calculated from the following:   Height as of 10/11/21: 5\' 6"  (1.676 m).   Weight as of an earlier encounter on 10/18/21: 91 lb 9.6 oz (41.5 kg).  PERFORMANCE STATUS (ECOG) : 1 - Symptomatic but completely  ambulatory  Physical Exam General: NAD, ambulatory Resp: Normal breathing pattern Neurological: AAO x4, mood appropriate   IMPRESSION:  Kristi Jennings presents to clinic today with Kristi Jennings husband for ongoing symptom management support. Kristi Jennings was seen by Dr. Julien Nordmann and scheduled for infusion. Tolerating treatments. Continue to do well and show improvement. No acute distress noted.   Pain Mrs. Simon's pain is much improved. Kristi Jennings continues to take MS Contin twice daily. No longer requiring dilaudid for breakthrough pain. Activity level continues to improve. Will wean MS Contin down to 15mg . Education provide on goal to wean completely off MS Contin. We will plan to evaluate at follow-up, if pain remains well controlled/resolved will decrease to once daily and then discontinue. Will continue to monitor closely.   Constipation No concerns with constipation.  Continues to take MiraLAX and senna as needed for bowel regimen.  Anxiety Kristi Jennings anxiety has decreased significantly mainly due to to Kristi Jennings improved wellbeing and decreased pain.  Kristi Jennings states Kristi Jennings is focusing on continued improvement and hopes things remain stable.   Decreased appetite/Weight loss Appetite is improving. Able to eat foods that Kristi Jennings enjoy and snacking throughout the day.   Kristi Jennings weight is remaining stable at 91 lbs from 89 lbs 4/19,  88.8 lbs on 3/29, 90 lbs on 3/8,  88lbs 2/15, 89lbs on 1/25 and 92lb on 1/11. Tolerating dronabinol. Will continue to monitor.   Overall patient is doing much better and symptom burden has drastically decreased and almost resolved..  We will continue to support and follow as needed.  PLAN: Kristi Jennings is much improved. Decreased MS Contin to 15 mg every 12 hours (we will plan to wean to 15mg  once daily if pain remains controlled with a goal of discontinuing) Patient is not requiring breakthrough medication. Much improved. Dronabinol 5 mg twice daily for appetite Ongoing symptom management (see above) I will plan to  see patient back in 4-6 weeks in collaboration with Kristi Jennings other appointments.    Patient expressed understanding and was in agreement with this plan. Kristi Jennings also understands that Kristi Jennings can call the clinic at any time with any questions, concerns, or complaints.   Number and complexity of problems addressed: 3 HIGH - 1 or more chronic illnesses with SEVERE exacerbation, progression, or side effects of treatment - advanced cancer, pain. Any controlled substances utilized were prescribed in the context of palliative care.   Time Total: 25 min   Visit consisted of counseling and education dealing with the complex and emotionally intense issues of symptom management and palliative care in the setting of serious and potentially life-threatening illness.Greater than 50%  of this time was spent counseling and coordinating care related to the above assessment and plan.  Alda Lea, AGPCNP-BC  Palliative Medicine Team/[place]  [place]

## 2021-10-18 NOTE — Progress Notes (Signed)
[place] Telephone:(336) [phone]   Fax:(336) [phone]  OFFICE PROGRESS NOTE  [name], NP [address] [place] [address]  DIAGNOSIS: Stage IV (T3, N2, M1 C) non-small cell lung cancer with unknown histologic subtype.  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   DETECTED ALTERATION(S) / BIOMARKER(S)      % CFDNA OR AMPLIFICATION        ASSOCIATED FDA-APPROVED THERAPIES         CLINICAL TRIAL AVAILABILITY [id number] 8.9%   Sotorasib Yes [id number] 7.2% None     Yes     PRIOR THERAPY:  Palliative radiotherapy to the painful bone lesions under the care of Dr. Sondra Come.   CURRENT THERAPY:  1) Palliative systemic chemotherapy with carboplatin for AUC of 5, Alimta 500 Mg/M2 and Keytruda 200 Mg IV every 3 weeks.  First dose of treatment on December 29, 2020. Status post 14  cycles.  Starting from cycle #5, the patient is going to start maintenance Alimta and Keytruda. Keytruda discontinued from cycle #7 due to myalgias.  2)Zometa every 6 weeks starting on 02/10/21 3) Possible palliative radiation to the enlarging pulmonary mass under the care of Dr. Sondra Come.  INTERVAL HISTORY: Kristi Jennings 76 y.o. female returns to the clinic today for follow-up visit accompanied by her husband.  The patient is feeling fine today with no concerning complaints except for mild fatigue and nasal congestion and runny nose.  She has been trying over-the-counter medication with not much improvement.  Her pain is much better and she would like to consider reducing her MS Contin.  She also will need a refill of Marinol.  She is scheduled to see the palliative care NP after my visit today.  She denied having any chest pain, shortness of breath, cough or hemoptysis.  She denied having any fever or chills.  She has no  nausea, vomiting, diarrhea or constipation.  She has no headache or visual changes.  She is here today for evaluation before starting cycle #15 of her treatment.  MEDICAL HISTORY: Past Medical History:  Diagnosis Date   Borderline diabetes mellitus    Bronchogenic cancer of right lung ([place])    Dyslipidemia    History of radiation therapy    right chest 12/21/2020-12/24/2020  Dr Gery Pray   History of radiation therapy    right lung 06/07/2021-06/17/2021  Dr Gery Pray   Hypertension    Polymyalgia rheumatica ([employer])     ALLERGIES:  is allergic to shellfish-derived products and augmentin [amoxicillin-pot clavulanate].  MEDICATIONS:  Current Outpatient Medications  Medication Sig Dispense Refill   acetaminophen (TYLENOL) 650 MG CR tablet Take 1,300 mg by mouth 3 (three) times daily as needed for pain.     aspirin EC 81 MG tablet Take 81 mg by mouth 4 (four) times a week. Swallow whole.     atenolol (TENORMIN) 25 MG tablet Take 12.5 mg by mouth at bedtime.     Calcium-Vitamin D-Vitamin K (CALCIUM + D) [phone] MG-UNT-MCG CHEW      Cholecalciferol (VITAMIN D) 50 MCG (2000 UT) tablet      diclofenac Sodium (VOLTAREN) 1 % GEL Apply 4 g topically 4 (four) times daily. (Patient taking differently: Apply 4 g topically 4 (four) times daily as needed (pain).) 150 g 0  dronabinol (MARINOL) 5 MG capsule Take 1 capsule (5 mg total) by mouth 2 (two) times daily before a meal. 30 capsule 1   enoxaparin (LOVENOX) 60 MG/0.6ML injection Inject 0.6 mLs (60 mg total) into the skin daily. 18 mL 2   escitalopram (LEXAPRO) 20 MG tablet Take 20 mg by mouth at bedtime.     fexofenadine (ALLEGRA) 180 MG tablet Take 180 mg by mouth daily.     folic acid (FOLVITE) 1 MG tablet TAKE ONE TABLET BY MOUTH ONE TIME DAILY 30 tablet 2   mirtazapine (REMERON) 15 MG tablet Take 15 mg by mouth at bedtime.     morphine (MS CONTIN) 30 MG 12 hr tablet Take 1 tablet (30 mg total) by mouth every 12 (twelve) hours. 60 tablet 0    Multiple Vitamin (MULTI VITAMIN) TABS      ondansetron (ZOFRAN-ODT) 4 MG disintegrating tablet Take 4 mg by mouth 2 (two) times daily as needed for nausea or vomiting. (Patient not taking: Reported on 07/21/2021)     predniSONE (DELTASONE) 5 MG tablet Take 5 mg by mouth daily. (Patient not taking: Reported on 6/38/4665)     Salicylic Acid (SCALPICIN EX) Apply 1 application. topically daily as needed.     senna-docusate (SENOKOT-S) 8.6-50 MG tablet Take 1 tablet by mouth 2 (two) times daily. 30 tablet 2   triamcinolone ointment (KENALOG) 0.1 % Apply 1 application. topically 2 (two) times daily.     No current facility-administered medications for this visit.    SURGICAL HISTORY: No past surgical history on file.  REVIEW OF SYSTEMS:  A comprehensive review of systems was negative except for: Ears, nose, mouth, throat, and face: positive for nasal congestion   PHYSICAL EXAMINATION: General appearance: alert, cooperative, fatigued, and no distress Head: Normocephalic, without obvious abnormality, atraumatic Neck: no adenopathy, no JVD, supple, symmetrical, trachea midline, and thyroid not enlarged, symmetric, no tenderness/mass/nodules Lymph nodes: Cervical, supraclavicular, and axillary nodes normal. Resp: clear to auscultation bilaterally Back: symmetric, no curvature. ROM normal. No CVA tenderness. Cardio: regular rate and rhythm, S1, S2 normal, no murmur, click, rub or gallop GI: soft, non-tender; bowel sounds normal; no masses,  no organomegaly Extremities: extremities normal, atraumatic, no cyanosis or edema  ECOG PERFORMANCE STATUS: 1 - Symptomatic but completely ambulatory  Blood pressure 127/72, pulse 69, temperature 97.8 F (36.6 C), temperature source Tympanic, resp. rate 15, weight 91 lb 9.6 oz (41.5 kg), SpO2 95 %.  LABORATORY DATA: Lab Results  Component Value Date   WBC 4.7 10/18/2021   HGB 11.0 (L) 10/18/2021   HCT 33.6 (L) 10/18/2021   MCV 102.4 (H) 10/18/2021   PLT  280 10/18/2021      Chemistry      Component Value Date/Time   NA 141 10/18/2021 0912   K 3.4 (L) 10/18/2021 0912   CL 102 10/18/2021 0912   CO2 34 (H) 10/18/2021 0912   BUN 11 10/18/2021 0912   CREATININE 0.69 10/18/2021 0912      Component Value Date/Time   CALCIUM 9.3 10/18/2021 0912   ALKPHOS 53 10/18/2021 0912   AST 15 10/18/2021 0912   ALT 8 10/18/2021 0912   BILITOT 0.4 10/18/2021 0912       RADIOGRAPHIC STUDIES: No results found.  ASSESSMENT AND PLAN: This is a very pleasant 76 years old white female diagnosed with Stage IV (T3, N2, M1 C) non-small cell lung cancer with unknown histologic subtype, likely adenocarcinoma with positive KRAS G12C mutation.  This was diagnosed in June  2022 and presented with large cavitary right upper lobe lung mass in addition to mediastinal lymphadenopathy and metastatic disease to the bones as well as right adrenal gland.  Status post palliative radiotherapy to the painful bone lesions under the care of Dr. Sondra Come.  The patient started induction systemic chemotherapy with carboplatin for AUC of 5, Alimta 500 Mg/M2 and Keytruda 200 Mg IV every 3 weeks and currently on maintenance treatment with single agent Alimta after Keytruda was discontinued starting from cycle #7 because of significant arthralgia and myalgia.  She is status post 14 cycles of total treatment. She has been tolerating her treatment well with no concerning adverse effects except for mild fatigue. I recommended for her to proceed with cycle #15 today as planned. I will see her back for follow-up visit in 3 weeks for evaluation with repeat CT scan of the chest, abdomen and pelvis for restaging of her disease. She underwent palliative radiotherapy to enlarging pulmonary nodule under the care of Dr. Sondra Come. For the pain management and lack of appetite, she is followed by the palliative care team and she will get refill of her MS Contin at a reduced dose and also Marinol from  them. She was advised to call immediately if she has any other concerning symptoms in the interval The patient voices understanding of current disease status and treatment options and is in agreement with the current care plan.  All questions were answered. The patient knows to call the clinic with any problems, questions or concerns. We can certainly see the patient much sooner if necessary.  The total time spent in the appointment was 20 minutes.  Disclaimer: This note was dictated with voice recognition software. Similar sounding words can inadvertently be transcribed and may not be corrected upon review.

## 2021-10-19 ENCOUNTER — Inpatient Hospital Stay (HOSPITAL_BASED_OUTPATIENT_CLINIC_OR_DEPARTMENT_OTHER): Admission: RE | Admit: 2021-10-19 | Payer: Medicare HMO | Source: Ambulatory Visit

## 2021-10-19 ENCOUNTER — Telehealth (HOSPITAL_BASED_OUTPATIENT_CLINIC_OR_DEPARTMENT_OTHER): Payer: Self-pay

## 2021-10-19 DIAGNOSIS — C3490 Malignant neoplasm of unspecified part of unspecified bronchus or lung: Secondary | ICD-10-CM | POA: Diagnosis not present

## 2021-10-27 ENCOUNTER — Telehealth: Payer: Self-pay | Admitting: Internal Medicine

## 2021-10-27 NOTE — Telephone Encounter (Signed)
Called patient regarding upcoming appointments, patient is notified. 

## 2021-11-02 ENCOUNTER — Telehealth: Payer: Self-pay

## 2021-11-02 ENCOUNTER — Other Ambulatory Visit: Payer: Self-pay

## 2021-11-02 NOTE — Telephone Encounter (Signed)
Pt hospice nurse called to report that pt was in some increased pain over the weekend when titrating down on her morphine. This RN called to check in on pt and she reports that she did reduce her morphine dose over the weekend as directed to one tab in the morning and another at bedtime. Pt reported that her pain is now subsided and other than fatigue she is doing well. No further needs at this time.

## 2021-11-03 ENCOUNTER — Other Ambulatory Visit: Payer: Self-pay | Admitting: Nurse Practitioner

## 2021-11-03 ENCOUNTER — Telehealth: Payer: Self-pay

## 2021-11-03 MED ORDER — LIDOCAINE 5 % EX PTCH: 1.0000 | MEDICATED_PATCH | CUTANEOUS | 0 refills | Status: DC

## 2021-11-03 NOTE — Telephone Encounter (Signed)
Pt husband called and informed us about pt re-occurring back pain since dropping down on morphine. Pt reports a dull back ache in her lower back, heating pads and salonpas pads seem to help. Per [name], NP lidocaine patch called into preferred pharmacy and pt can take home breakthrough medications for pain management. Pt husband verbalized understanding.

## 2021-11-04 ENCOUNTER — Inpatient Hospital Stay: Payer: Medicare HMO | Attending: Internal Medicine | Admitting: Nurse Practitioner

## 2021-11-04 ENCOUNTER — Telehealth: Payer: Self-pay

## 2021-11-04 ENCOUNTER — Encounter: Payer: Self-pay | Admitting: Nurse Practitioner

## 2021-11-04 DIAGNOSIS — C3411 Malignant neoplasm of upper lobe, right bronchus or lung: Secondary | ICD-10-CM | POA: Insufficient documentation

## 2021-11-04 DIAGNOSIS — R530 Neoplastic (malignant) related fatigue: Secondary | ICD-10-CM | POA: Diagnosis not present

## 2021-11-04 DIAGNOSIS — C3491 Malignant neoplasm of unspecified part of right bronchus or lung: Secondary | ICD-10-CM

## 2021-11-04 DIAGNOSIS — Z515 Encounter for palliative care: Secondary | ICD-10-CM | POA: Diagnosis not present

## 2021-11-04 DIAGNOSIS — Z5111 Encounter for antineoplastic chemotherapy: Secondary | ICD-10-CM | POA: Insufficient documentation

## 2021-11-04 DIAGNOSIS — Z87891 Personal history of nicotine dependence: Secondary | ICD-10-CM | POA: Insufficient documentation

## 2021-11-04 DIAGNOSIS — G893 Neoplasm related pain (acute) (chronic): Secondary | ICD-10-CM

## 2021-11-04 DIAGNOSIS — C7951 Secondary malignant neoplasm of bone: Secondary | ICD-10-CM | POA: Diagnosis not present

## 2021-11-04 DIAGNOSIS — C7971 Secondary malignant neoplasm of right adrenal gland: Secondary | ICD-10-CM | POA: Insufficient documentation

## 2021-11-04 NOTE — Telephone Encounter (Signed)
Spoke w/ Pt's husband [name] (on [place])- went over labs from 10/11/21- he states his wife is being treated for lung CA and over the last week has been experiencing more fatigue lately- informed that office is full today- offered to schedule appt for Monday but she is seeing oncology Monday- they will discuss sx's with them .

## 2021-11-04 NOTE — Progress Notes (Signed)
[place]  [phone] [phone]   Name: Kristi Jennings Date: 11/04/2021 MRN: [id number]  DOB: August 27, 1945  Patient Care Team: Kristi Saver, NP as PCP - General (Family Medicine) [place] Registered Nurse [facility] and Palliative Medicine)   I connected with Kristi Jennings on 11/04/21 at 11:00 AM EDT by phone and verified that I am speaking with the correct person using two identifiers.   I discussed the limitations, risks, security and privacy concerns of performing an evaluation and management service by telemedicine and the availability of in-person appointments. I also discussed with the patient that there may be a patient responsible charge related to this service. The patient expressed understanding and agreed to proceed.   Other persons participating in the visit and their role in the encounter: Kristi Jennings (husband)    Patient's location: Home  Provider's location: [place]    INTERVAL HISTORY: Kristi Jennings is a 76 y.o. 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 Palliative ask to see for symptom management.  SOCIAL HISTORY:     reports that she quit smoking about 12 years ago. Her smoking use included cigarettes. She has a 43.00 pack-year smoking history. She has never used smokeless tobacco. She reports that she does not currently use alcohol. She reports that she does not use drugs.  ADVANCE DIRECTIVES:  None on file   CODE STATUS:   PAST MEDICAL HISTORY: Past Medical History:  Diagnosis Date   Borderline diabetes mellitus    Bronchogenic cancer of right lung ([place])    Dyslipidemia    History of radiation therapy    right chest 12/21/2020-12/24/2020  Dr Gery Pray   History of radiation therapy    right lung 06/07/2021-06/17/2021  Dr Gery Pray   Hypertension    Polymyalgia  rheumatica ([place])    ALLERGIES:  is allergic to shellfish-derived products and augmentin [amoxicillin-pot clavulanate].  MEDICATIONS:  Current Outpatient Medications  Medication Sig Dispense Refill   acetaminophen (TYLENOL) 650 MG CR tablet Take 1,300 mg by mouth 3 (three) times daily as needed for pain.     aspirin EC 81 MG tablet Take 81 mg by mouth 4 (four) times a week. Swallow whole.     atenolol (TENORMIN) 25 MG tablet Take 12.5 mg by mouth at bedtime.     Calcium-Vitamin D-Vitamin K (CALCIUM + D) [phone] MG-UNT-MCG CHEW      Cholecalciferol (VITAMIN D) 50 MCG (2000 UT) tablet      diclofenac Sodium (VOLTAREN) 1 % GEL Apply 4 g topically 4 (four) times daily. (Patient taking differently: Apply 4 g topically 4 (four) times daily as needed (pain).) 150 g 0   dronabinol (MARINOL) 5 MG capsule Take 1 capsule (5 mg total) by mouth 2 (two) times daily before a meal. 60 capsule 1   enoxaparin (LOVENOX) 60 MG/0.6ML injection Inject 0.6 mLs (60 mg total) into the skin daily. 18 mL 2   escitalopram (LEXAPRO) 20 MG tablet Take 20 mg by mouth at bedtime.     fexofenadine (ALLEGRA) 180 MG tablet Take 180 mg by mouth daily.     fluocinonide (LIDEX) 0.05 % external solution Apply topically daily.     folic acid (FOLVITE) 1 MG tablet TAKE ONE TABLET BY MOUTH ONE TIME DAILY 30 tablet 2   lidocaine (LIDODERM) 5 % Place 1 patch onto the skin  daily. Remove & Discard patch within 12 hours or as directed by MD 30 patch 0   mirtazapine (REMERON) 15 MG tablet Take 15 mg by mouth at bedtime.     morphine (MS CONTIN) 15 MG 12 hr tablet Take 2 tablets (30 mg total) by mouth every 12 (twelve) hours. 60 tablet 0   Multiple Vitamin (MULTI VITAMIN) TABS      ondansetron (ZOFRAN-ODT) 4 MG disintegrating tablet Take 4 mg by mouth 2 (two) times daily as needed for nausea or vomiting. (Patient not taking: Reported on 07/21/2021)     predniSONE (DELTASONE) 1 MG tablet Take 4 mg by mouth daily.     predniSONE (DELTASONE)  5 MG tablet Take 5 mg by mouth daily. (Patient not taking: Reported on 7/78/2423)     Salicylic Acid (SCALPICIN EX) Apply 1 application. topically daily as needed.     senna-docusate (SENOKOT-S) 8.6-50 MG tablet Take 1 tablet by mouth 2 (two) times daily. 30 tablet 2   triamcinolone ointment (KENALOG) 0.1 % Apply 1 application. topically 2 (two) times daily.     No current facility-administered medications for this visit.    VITAL SIGNS: There were no vitals taken for this visit. There were no vitals filed for this visit.   Estimated body mass index is 14.78 kg/m as calculated from the following:   Height as of 10/11/21: 5\' 6"  (1.676 m).   Weight as of 10/18/21: 91 lb 9.6 oz (41.5 kg).  PERFORMANCE STATUS (ECOG) : 1 - Symptomatic but completely ambulatory    IMPRESSION:  I connected wit Mr. And Kristi Jennings by phone for symptom management follow-up. She is feeling much better today in regards to lower back pain. Kristi Jennings has been applying Capsaicin patch which has provided her with some relief. Appetite is fair.  Does endorse some fatigue today.  Reports she is taking it easy on today.  Recommended drinking Ensure today for additional protein and allowing her body to rest with hopes of improved energy over the weekend.  Does feel as the day has gone on it is getting slightly better.  Pain Kristi Jennings was experiencing some occasional back pain which started on Sunday.  Reports he got better on Monday however returned with a dull ache and discomfort on Thursday evening.  We discussed use of Lidoderm patches to area.  Her husband has also been applying capsaicin patches which does provide her some relief.  Education provided on not using heat on top of patch.  She denies any heavy lifting, turning, or injury.  Does feel some improvement over the past 24 hours.  She is tolerating decreased dose of MS Contin.  No significant side effects.  We will continue to closely monitor.  Constipation No  concerns with constipation.  Continues to take MiraLAX and senna as needed for bowel regimen.  Anxiety Her anxiety has decreased significantly mainly due to to her improved wellbeing and decreased pain.  She states she is focusing on continued improvement and hopes things remain stable.   Decreased appetite/Weight loss Appetite is improving. Able to eat foods that she enjoy and snacking throughout the day.   Her weight is remaining stable at 91 lbs from 89 lbs 4/19,  88.8 lbs on 3/29, 90 lbs on 3/8,  88lbs 2/15, 89lbs on 1/25 and 92lb on 1/11. Tolerating dronabinol. Will continue to monitor.   Overall patient is doing much better and symptom burden has drastically decreased and almost resolved..  We will continue to support and  follow as needed.  PLAN: Some lower back pain.  Applying lidocaine patches to site with relief. Decreased MS Contin to 15 mg every 12 hours (we will plan to wean to 15mg  once daily if pain remains controlled with a goal of discontinuing) tolerating without difficulty. Patient is not requiring breakthrough medication. Much improved. Dronabinol 5 mg twice daily for appetite.  Improving Ongoing symptom management (see above) I will plan to see patient back in 4-6 weeks in collaboration with her other appointments.  I will plan to follow-up with phone call on next week to see how her weekend went.   Patient expressed understanding and was in agreement with this plan. She also understands that She can call the clinic at any time with any questions, concerns, or complaints.   Time Total: 40 min   Visit consisted of counseling and education dealing with the complex and emotionally intense issues of symptom management and palliative care in the setting of serious and potentially life-threatening illness.Greater than 50%  of this time was spent counseling and coordinating care related to the above assessment and plan.  Alda Lea, AGPCNP-BC  Palliative Medicine  Team/[place]

## 2021-11-06 DIAGNOSIS — C3491 Malignant neoplasm of unspecified part of right bronchus or lung: Secondary | ICD-10-CM | POA: Diagnosis not present

## 2021-11-07 ENCOUNTER — Telehealth: Payer: Self-pay

## 2021-11-07 ENCOUNTER — Ambulatory Visit (HOSPITAL_COMMUNITY)
Admission: RE | Admit: 2021-11-07 | Discharge: 2021-11-07 | Disposition: A | Discharge status: Home / Self Care | Payer: Medicare HMO | Source: Ambulatory Visit | Attending: Internal Medicine | Admitting: Internal Medicine

## 2021-11-07 DIAGNOSIS — E279 Disorder of adrenal gland, unspecified: Secondary | ICD-10-CM | POA: Diagnosis not present

## 2021-11-07 DIAGNOSIS — C3490 Malignant neoplasm of unspecified part of unspecified bronchus or lung: Secondary | ICD-10-CM | POA: Diagnosis not present

## 2021-11-07 DIAGNOSIS — K7689 Other specified diseases of liver: Secondary | ICD-10-CM | POA: Diagnosis not present

## 2021-11-07 MED ORDER — SODIUM CHLORIDE (PF) 0.9 % IJ SOLN
INTRAMUSCULAR | Status: AC
  Filled 2021-11-07: qty 50

## 2021-11-07 MED ORDER — IOHEXOL 300 MG/ML  SOLN
100.0000 mL | Freq: Once | INTRAMUSCULAR | Status: AC | PRN
  Administered 2021-11-07: 100 mL via INTRAVENOUS

## 2021-11-07 NOTE — Progress Notes (Unsigned)
[facility] OFFICE PROGRESS NOTE  [name], NP [address] [place] [address]  DIAGNOSIS: Stage IV (T3, N2, M1 C) non-small cell lung cancer with unknown histologic subtype.  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   DETECTED ALTERATION(S) / BIOMARKER(S)      % CFDNA OR AMPLIFICATION        ASSOCIATED FDA-APPROVED THERAPIES         CLINICAL TRIAL AVAILABILITY [id number] 8.9%   Sotorasib Yes [id number] 7.2% None     Yes    PRIOR THERAPY: 1) Palliative radiotherapy to the painful bone lesions under the care of Dr. Sondra Come. 2) palliative radiation to the enlarging pulmonary mass under the care of Dr. Sondra Come. Last dose on 06/17/21  CURRENT THERAPY:   1) Palliative systemic chemotherapy with carboplatin for AUC of 5, Alimta 500 Mg/M2 and Keytruda 200 Mg IV every 3 weeks.  First dose of treatment on December 29, 2020. Status post 15 cycles.  Starting from cycle #5, the patient is going to start maintenance Alimta and Keytruda. Keytruda discontinued from cycle #7 due to myalgias.  2) Zometa every 12 weeks starting on 02/10/21  INTERVAL HISTORY: Kristi Jennings 76 y.o. female returns to the clinic today for follow-up visit accompanied by her husband.  The patient denies any concerning complaints today except for ***.  Allergies?  The patient is currently undergoing prednisone treatment for her diffuse myalgias under the care of rheumatology for her polymyalgia rheumatica.  She also follows closely with palliative care regarding decreased appetite and weight loss and nutrition.  She takes Marinol for her appetite.  She has noticed improvement in her appetite.  Her weight is ***.  She is also currently undergoing treatment for pain management with MS Contin with the ultimate goal of discontinuing this  completely in the future.  Senna S is a prescription  She is currently on Lovenox due to a history lower extremity DVT while on anticoagulation.  Otherwise the patient denies any fever or chills.  She is denies any breathing changes.  Denies any significant dyspnea on exertion, cough, chest pain, or hemoptysis.  Denies any nausea, vomiting, diarrhea, or constipation.  Denies any headache or visual changes.  She recently had a restaging CT scan performed.  She is here today for evaluation to review her scan result before starting cycle #16.  MEDICAL HISTORY: Past Medical History:  Diagnosis Date   Borderline diabetes mellitus    Bronchogenic cancer of right lung ([place])    Dyslipidemia    History of radiation therapy    right chest 12/21/2020-12/24/2020  Dr Gery Pray   History of radiation therapy    right lung 06/07/2021-06/17/2021  Dr Gery Pray   Hypertension    Polymyalgia rheumatica ([place])     ALLERGIES:  is allergic to shellfish-derived products and augmentin [amoxicillin-pot clavulanate].  MEDICATIONS:  Current Outpatient Medications  Medication Sig Dispense Refill   acetaminophen (TYLENOL) 650 MG CR tablet Take 1,300 mg by mouth 3 (three) times daily as needed for pain.     aspirin EC 81 MG tablet Take 81 mg by mouth 4 (four) times a week. Swallow whole.     atenolol (TENORMIN) 25 MG tablet Take 12.5 mg by mouth at bedtime.     Calcium-Vitamin D-Vitamin K (CALCIUM + D) [phone]  MG-UNT-MCG CHEW      Cholecalciferol (VITAMIN D) 50 MCG (2000 UT) tablet      diclofenac Sodium (VOLTAREN) 1 % GEL Apply 4 g topically 4 (four) times daily. (Patient taking differently: Apply 4 g topically 4 (four) times daily as needed (pain).) 150 g 0   dronabinol (MARINOL) 5 MG capsule Take 1 capsule (5 mg total) by mouth 2 (two) times daily before a meal. 60 capsule 1   enoxaparin (LOVENOX) 60 MG/0.6ML injection Inject 0.6 mLs (60 mg total) into the skin daily. 18 mL 2   escitalopram (LEXAPRO) 20 MG  tablet Take 20 mg by mouth at bedtime.     fexofenadine (ALLEGRA) 180 MG tablet Take 180 mg by mouth daily.     fluocinonide (LIDEX) 0.05 % external solution Apply topically daily.     folic acid (FOLVITE) 1 MG tablet TAKE ONE TABLET BY MOUTH ONE TIME DAILY 30 tablet 2   lidocaine (LIDODERM) 5 % Place 1 patch onto the skin daily. Remove & Discard patch within 12 hours or as directed by MD 30 patch 0   mirtazapine (REMERON) 15 MG tablet Take 15 mg by mouth at bedtime.     morphine (MS CONTIN) 15 MG 12 hr tablet Take 2 tablets (30 mg total) by mouth every 12 (twelve) hours. 60 tablet 0   Multiple Vitamin (MULTI VITAMIN) TABS      ondansetron (ZOFRAN-ODT) 4 MG disintegrating tablet Take 4 mg by mouth 2 (two) times daily as needed for nausea or vomiting. (Patient not taking: Reported on 07/21/2021)     predniSONE (DELTASONE) 1 MG tablet Take 4 mg by mouth daily.     predniSONE (DELTASONE) 5 MG tablet Take 5 mg by mouth daily. (Patient not taking: Reported on 10/07/8414)     Salicylic Acid (SCALPICIN EX) Apply 1 application. topically daily as needed.     senna-docusate (SENOKOT-S) 8.6-50 MG tablet Take 1 tablet by mouth 2 (two) times daily. 30 tablet 2   triamcinolone ointment (KENALOG) 0.1 % Apply 1 application. topically 2 (two) times daily.     No current facility-administered medications for this visit.   Facility-Administered Medications Ordered in Other Visits  Medication Dose Route Frequency Provider Last Rate Last Admin   sodium chloride (PF) 0.9 % injection             SURGICAL HISTORY: No past surgical history on file.  REVIEW OF SYSTEMS:   Review of Systems  Constitutional: Negative for appetite change, chills, fatigue, fever and unexpected weight change.  HENT:   Negative for mouth sores, nosebleeds, sore throat and trouble swallowing.   Eyes: Negative for eye problems and icterus.  Respiratory: Negative for cough, hemoptysis, shortness of breath and wheezing.   Cardiovascular:  Negative for chest pain and leg swelling.  Gastrointestinal: Negative for abdominal pain, constipation, diarrhea, nausea and vomiting.  Genitourinary: Negative for bladder incontinence, difficulty urinating, dysuria, frequency and hematuria.   Musculoskeletal: Negative for back pain, gait problem, neck pain and neck stiffness.  Skin: Negative for itching and rash.  Neurological: Negative for dizziness, extremity weakness, gait problem, headaches, light-headedness and seizures.  Hematological: Negative for adenopathy. Does not bruise/bleed easily.  Psychiatric/Behavioral: Negative for confusion, depression and sleep disturbance. The patient is not nervous/anxious.     PHYSICAL EXAMINATION:  There were no vitals taken for this visit.  ECOG PERFORMANCE STATUS: {CHL ONC ECOG [id number]  Physical Exam  Constitutional: Oriented to person, place, and time and well-developed, well-nourished, and in no distress.  No distress.  HENT:  Head: Normocephalic and atraumatic.  Mouth/Throat: Oropharynx is clear and moist. No oropharyngeal exudate.  Eyes: Conjunctivae are normal. Right eye exhibits no discharge. Left eye exhibits no discharge. No scleral icterus.  Neck: Normal range of motion. Neck supple.  Cardiovascular: Normal rate, regular rhythm, normal heart sounds and intact distal pulses.   Pulmonary/Chest: Effort normal and breath sounds normal. No respiratory distress. No wheezes. No rales.  Abdominal: Soft. Bowel sounds are normal. Exhibits no distension and no mass. There is no tenderness.  Musculoskeletal: Normal range of motion. Exhibits no edema.  Lymphadenopathy:    No cervical adenopathy.  Neurological: Alert and oriented to person, place, and time. Exhibits normal muscle tone. Gait normal. Coordination normal.  Skin: Skin is warm and dry. No rash noted. Not diaphoretic. No erythema. No pallor.  Psychiatric: Mood, memory and judgment normal.  Vitals reviewed.  LABORATORY DATA: Lab  Results  Component Value Date   WBC 4.7 10/18/2021   HGB 11.0 (L) 10/18/2021   HCT 33.6 (L) 10/18/2021   MCV 102.4 (H) 10/18/2021   PLT 280 10/18/2021      Chemistry      Component Value Date/Time   NA 141 10/18/2021 0912   K 3.4 (L) 10/18/2021 0912   CL 102 10/18/2021 0912   CO2 34 (H) 10/18/2021 0912   BUN 11 10/18/2021 0912   CREATININE 0.69 10/18/2021 0912      Component Value Date/Time   CALCIUM 9.3 10/18/2021 0912   ALKPHOS 53 10/18/2021 0912   AST 15 10/18/2021 0912   ALT 8 10/18/2021 0912   BILITOT 0.4 10/18/2021 0912       RADIOGRAPHIC STUDIES:  No results found.   ASSESSMENT/PLAN:  This is a very pleasant 76 year old Caucasian female diagnosed with stage IV (T3, N2, M1 C) non-small cell lung cancer with unknown histologic subtype questionably likely to be squamous cell carcinoma based on the morphology of the right upper lobe cavitary mass with mediastinal lymphadenopathy metastatic disease to the bones as well as the right adrenal gland.  She was diagnosed in June 2022.  Adenocarcinoma cannot be completely ruled out especially with the K-ras G12C mutation.  Since the patient has an actionable mutation with K-ras G12C, this can be used as an option in the second line setting.   The patient completed palliative radiotherapy to multiple metastatic bone lesions under the care of Dr. Sondra Come.   The patient is currently undergoing systemic chemotherapy with carboplatin AUC 5, Alimta 500 mg per metered squared, Keytruda 200 mg IV every 3 weeks.  She is status post 15 cycles.  Starting from cycle #5, the patient will be on maintenance Alimta 500 mg per metered squared and Keytruda 200 mg. We have discontinued Keytruda starting from cycle #7 due to her significant myalgias in the event this is immunotherapy related.   She is presently on prednisone for polymyalgia rheumatica. Her pain has significantly improved at this time.     The patient completed additional radiation  under the care of Dr. Sondra Come on June 17, 2021 to the enlarging primary lung mass.    The patient recently had a restaging CT scan performed.  Dr. Julien Nordmann personally and independently reviewed the scan and discussed the results with the patient today.  The scan showed ***  Dr. Julien Nordmann recommends the patient continue with cycle #16 today scheduled.  We will see her back for follow-up visit in 3 weeks for evaluation and repeat blood work before starting cycle #17.  She will continue to take MS contin BID for pain***check dose.   She will continue with Zometa every 12 weeks for bone disease  She will continue taking Marinol for decreased appetite. Encouraged to eat small frequent meals if she gets full quickly.   She will continue lovenox for her history of DVT. She has some mild/mimimal swelling in her feet but significantly improved compared to prior. Dr. Julien Nordmann believes the ruddy appearance of her feet this is related to vascular insuffiencey. Advised to elevate, use compression stockings if she is on her feet a lot, and avoid excessive salt in the diet.   The patient was advised to call immediately if she has any concerning symptoms in the interval. The patient voices understanding of current disease status and treatment options and is in agreement with the current care plan. All questions were answered. The patient knows to call the clinic with any problems, questions or concerns. We can certainly see the patient much sooner if necessary          No orders of the defined types were placed in this encounter.    I spent {CHL ONC TIME VISIT - ZOXWR:[id number]} counseling the patient face to face. The total time spent in the appointment was {CHL ONC TIME VISIT - BJYNW:[id number]}.  Kristi Fiero L Anissia Wessells, PA-C 11/07/21

## 2021-11-07 NOTE — Telephone Encounter (Signed)
Notified Patient of prior authorization approval for Lidocaine 5% Patches. Medication has been approved through 05/21/2022. Patient states that she does not need the patches at this time because her husband has been using some patches that he has. Advised Patient that medication was approved through 05/21/22 if medication is needed. Advised Patient to notify Provider of use of other patches for pain relief. Patient stated that Provider is aware. No other needs or concerns voiced at this time.

## 2021-11-08 ENCOUNTER — Other Ambulatory Visit: Payer: Self-pay

## 2021-11-08 DIAGNOSIS — R530 Neoplastic (malignant) related fatigue: Secondary | ICD-10-CM

## 2021-11-08 DIAGNOSIS — C3491 Malignant neoplasm of unspecified part of right bronchus or lung: Secondary | ICD-10-CM

## 2021-11-08 NOTE — Progress Notes (Signed)
Order place for B-12 Lab to be drawn tomorrow per family request

## 2021-11-09 ENCOUNTER — Inpatient Hospital Stay: Payer: Medicare HMO

## 2021-11-09 ENCOUNTER — Inpatient Hospital Stay (HOSPITAL_BASED_OUTPATIENT_CLINIC_OR_DEPARTMENT_OTHER): Payer: Medicare HMO | Admitting: Nurse Practitioner

## 2021-11-09 ENCOUNTER — Other Ambulatory Visit: Payer: Self-pay

## 2021-11-09 ENCOUNTER — Inpatient Hospital Stay (HOSPITAL_BASED_OUTPATIENT_CLINIC_OR_DEPARTMENT_OTHER): Payer: Medicare HMO | Admitting: Physician Assistant

## 2021-11-09 ENCOUNTER — Encounter: Payer: Self-pay | Admitting: Nurse Practitioner

## 2021-11-09 VITALS — BP 173/69 | HR 60 | Temp 98.3°F | Resp 16

## 2021-11-09 VITALS — BP 161/83 | HR 76 | Temp 98.4°F | Resp 16 | Wt 88.4 lb

## 2021-11-09 DIAGNOSIS — Z515 Encounter for palliative care: Secondary | ICD-10-CM

## 2021-11-09 DIAGNOSIS — C7951 Secondary malignant neoplasm of bone: Secondary | ICD-10-CM

## 2021-11-09 DIAGNOSIS — R634 Abnormal weight loss: Secondary | ICD-10-CM

## 2021-11-09 DIAGNOSIS — Z87891 Personal history of nicotine dependence: Secondary | ICD-10-CM | POA: Diagnosis not present

## 2021-11-09 DIAGNOSIS — Z5111 Encounter for antineoplastic chemotherapy: Secondary | ICD-10-CM | POA: Diagnosis not present

## 2021-11-09 DIAGNOSIS — C3491 Malignant neoplasm of unspecified part of right bronchus or lung: Secondary | ICD-10-CM

## 2021-11-09 DIAGNOSIS — R530 Neoplastic (malignant) related fatigue: Secondary | ICD-10-CM

## 2021-11-09 DIAGNOSIS — K5903 Drug induced constipation: Secondary | ICD-10-CM

## 2021-11-09 DIAGNOSIS — C3411 Malignant neoplasm of upper lobe, right bronchus or lung: Secondary | ICD-10-CM | POA: Diagnosis not present

## 2021-11-09 DIAGNOSIS — Z7189 Other specified counseling: Secondary | ICD-10-CM | POA: Diagnosis not present

## 2021-11-09 DIAGNOSIS — R630 Anorexia: Secondary | ICD-10-CM | POA: Diagnosis not present

## 2021-11-09 DIAGNOSIS — C7971 Secondary malignant neoplasm of right adrenal gland: Secondary | ICD-10-CM | POA: Diagnosis not present

## 2021-11-09 LAB — CBC WITH DIFFERENTIAL (CANCER CENTER ONLY)
Abs Immature Granulocytes: 0.02 10*3/uL (ref 0.00–0.07)
Basophils Absolute: 0 10*3/uL (ref 0.0–0.1)
Basophils Relative: 0 %
Eosinophils Absolute: 0.1 10*3/uL (ref 0.0–0.5)
Eosinophils Relative: 3 %
HCT: 36 % (ref 36.0–46.0)
Hemoglobin: 11.8 g/dL — ABNORMAL LOW (ref 12.0–15.0)
Immature Granulocytes: 0 %
Lymphocytes Relative: 26 %
Lymphs Abs: 1.2 10*3/uL (ref 0.7–4.0)
MCH: 33.1 pg (ref 26.0–34.0)
MCHC: 32.8 g/dL (ref 30.0–36.0)
MCV: 100.8 fL — ABNORMAL HIGH (ref 80.0–100.0)
Monocytes Absolute: 0.7 10*3/uL (ref 0.1–1.0)
Monocytes Relative: 16 %
Neutro Abs: 2.5 10*3/uL (ref 1.7–7.7)
Neutrophils Relative %: 55 %
Platelet Count: 284 10*3/uL (ref 150–400)
RBC: 3.57 MIL/uL — ABNORMAL LOW (ref 3.87–5.11)
RDW: 14.6 % (ref 11.5–15.5)
WBC Count: 4.6 10*3/uL (ref 4.0–10.5)
nRBC: 0 % (ref 0.0–0.2)

## 2021-11-09 LAB — CMP (CANCER CENTER ONLY)
ALT: 10 U/L (ref 0–44)
AST: 16 U/L (ref 15–41)
Albumin: 4 g/dL (ref 3.5–5.0)
Alkaline Phosphatase: 57 U/L (ref 38–126)
Anion gap: 7 (ref 5–15)
BUN: 16 mg/dL (ref 8–23)
CO2: 31 mmol/L (ref 22–32)
Calcium: 9.7 mg/dL (ref 8.9–10.3)
Chloride: 101 mmol/L (ref 98–111)
Creatinine: 0.68 mg/dL (ref 0.44–1.00)
GFR, Estimated: >60 mL/min (ref >60)
Glucose, Bld: 113 mg/dL — ABNORMAL HIGH (ref 70–99)
Potassium: 3.7 mmol/L (ref 3.5–5.1)
Sodium: 139 mmol/L (ref 135–145)
Total Bilirubin: 0.3 mg/dL (ref 0.3–1.2)
Total Protein: 7.2 g/dL (ref 6.5–8.1)

## 2021-11-09 LAB — VITAMIN B12: Vitamin B-12: 3094 pg/mL — ABNORMAL HIGH (ref 180–914)

## 2021-11-09 MED ORDER — SODIUM CHLORIDE 0.9 % IV SOLN
500.0000 mg/m2 | Freq: Once | INTRAVENOUS | Status: AC
  Administered 2021-11-09: 700 mg via INTRAVENOUS
  Filled 2021-11-09: qty 20

## 2021-11-09 MED ORDER — SODIUM CHLORIDE 0.9 % IV SOLN: Freq: Once | INTRAVENOUS | Status: AC

## 2021-11-09 NOTE — Progress Notes (Signed)
Pt due for Zometa today.  Pt informed RN that she has not received dental clearance.  Cassie, PA-C made aware.  Per PA-C hold Zometa until dental clearance is obtained.  RN educated Pt on importance of obtaining dental clearance. Pt verbalized understanding.

## 2021-11-09 NOTE — Progress Notes (Signed)
[place]  [phone] [phone]) [phone]   Name: Kristi Jennings Date: 11/09/2021 MRN: [id number]  DOB: September 18, 1945  Patient Care Team: [name], NP as PCP - General (Family Medicine) [facility] as Registered Nurse [facility] and Palliative Medicine)    INTERVAL HISTORY: Kristi Jennings is a 76 y.o. 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 Palliative ask to see for symptom management.  SOCIAL HISTORY:     reports that she quit smoking about 12 years ago. Her smoking use included cigarettes. She has a 43.00 pack-year smoking history. She has never used smokeless tobacco. She reports that she does not currently use alcohol. She reports that she does not use drugs.  ADVANCE DIRECTIVES:  None on file   CODE STATUS:   PAST MEDICAL HISTORY: Past Medical History:  Diagnosis Date   Borderline diabetes mellitus    Bronchogenic cancer of right lung ([place])    Dyslipidemia    History of radiation therapy    right chest 12/21/2020-12/24/2020  Dr Gery Pray   History of radiation therapy    right lung 06/07/2021-06/17/2021  Dr Gery Pray   Hypertension    Polymyalgia rheumatica ([place])    ALLERGIES:  is allergic to shellfish-derived products and augmentin [amoxicillin-pot clavulanate].  MEDICATIONS:  Current Outpatient Medications  Medication Sig Dispense Refill   acetaminophen (TYLENOL) 650 MG CR tablet Take 1,300 mg by mouth 3 (three) times daily as needed for pain.     aspirin EC 81 MG tablet Take 81 mg by mouth 4 (four) times a week. Swallow whole. (Patient not taking: Reported on 11/09/2021)     atenolol (TENORMIN) 25 MG tablet Take 12.5 mg by mouth at bedtime.     Calcium-Vitamin D-Vitamin K (CALCIUM + D) [phone] MG-UNT-MCG CHEW      Cholecalciferol (VITAMIN D) 50 MCG (2000 UT) tablet      diclofenac  Sodium (VOLTAREN) 1 % GEL Apply 4 g topically 4 (four) times daily. (Patient taking differently: Apply 4 g topically 4 (four) times daily as needed (pain).) 150 g 0   dronabinol (MARINOL) 5 MG capsule Take 1 capsule (5 mg total) by mouth 2 (two) times daily before a meal. 60 capsule 1   enoxaparin (LOVENOX) 60 MG/0.6ML injection Inject 0.6 mLs (60 mg total) into the skin daily. 18 mL 2   escitalopram (LEXAPRO) 20 MG tablet Take 20 mg by mouth at bedtime.     fexofenadine (ALLEGRA) 180 MG tablet Take 180 mg by mouth daily.     fluocinonide (LIDEX) 0.05 % external solution Apply topically daily.     folic acid (FOLVITE) 1 MG tablet TAKE ONE TABLET BY MOUTH ONE TIME DAILY 30 tablet 2   lidocaine (LIDODERM) 5 % Place 1 patch onto the skin daily. Remove & Discard patch within 12 hours or as directed by MD (Patient not taking: Reported on 11/09/2021) 30 patch 0   mirtazapine (REMERON) 15 MG tablet Take 15 mg by mouth at bedtime.     morphine (MS CONTIN) 15 MG 12 hr tablet Take 2 tablets (30 mg total) by mouth every 12 (twelve) hours. 60 tablet 0   Multiple Vitamin (MULTI VITAMIN) TABS      ondansetron (ZOFRAN-ODT) 4 MG disintegrating tablet Take 4 mg by mouth 2 (two) times daily as needed for nausea or vomiting. (Patient not taking: Reported on 07/21/2021)  predniSONE (DELTASONE) 1 MG tablet Take 3 mg by mouth daily.     predniSONE (DELTASONE) 5 MG tablet Take 5 mg by mouth daily.     Salicylic Acid (SCALPICIN EX) Apply 1 application. topically daily as needed.     senna-docusate (SENOKOT-S) 8.6-50 MG tablet Take 1 tablet by mouth 2 (two) times daily. 30 tablet 2   triamcinolone ointment (KENALOG) 0.1 % Apply 1 application. topically 2 (two) times daily.     No current facility-administered medications for this visit.    VITAL SIGNS: There were no vitals taken for this visit. There were no vitals filed for this visit.   Estimated body mass index is 14.27 kg/m as calculated from the following:    Height as of 10/11/21: 5\' 6"  (1.676 m).   Weight as of an earlier encounter on 11/09/21: 88 lb 6.4 oz (40.1 kg).  PERFORMANCE STATUS (ECOG) : 1 - Symptomatic but completely ambulatory  Physical Exam General: NAD, ambulatory Resp: Normal breathing pattern Neurological: AAO x4, mood appropriate   IMPRESSION:  Kristi Jennings presents to clinic today with her husband for ongoing symptom management support. Scheduled for infusion. Tolerating treatments. Reports some ongoing fatigue over the past several weeks. Vitamin B12 levels drawn today. CBC,CMP normal.   Pain We began weaning down MS Contin on 5/30. She is tolerating well however does endorse some lower back pain. Pain is manageable and is intermittent. Denies any recent injuries. Using lidocaine patch with some improvement.   We discussed current regimen. Will continue on MS Contin 15mg  twice daily with a goal of decreasing to once daily at next refill. She and husband verbalized understanding.   We will plan to evaluate at follow-up, if pain remains well controlled/resolved will decrease to once daily and then discontinue. Will continue to monitor closely.   Constipation No concerns with constipation.  Continues to take MiraLAX and senna as needed for bowel regimen.  Anxiety Her anxiety has decreased significantly mainly due to to her improved wellbeing and decreased pain.  She states she is focusing on continued improvement and hopes things remain stable.   Decreased appetite/Weight loss Appetite is improving. Able to eat foods that she enjoy and snacking throughout the day.   Her weight dropped a little this visit to 88lbs down from 91 lbs 5/30, 89 lbs 4/19,  88.8 lbs on 3/29, 90 lbs on 3/8,  88lbs 2/15, 89lbs on 1/25 and 92lb on 1/11. Tolerating dronabinol. Will continue to monitor.   Overall patient is doing much better and symptom burden has drastically decreased and almost resolved..  We will continue to support and follow as  needed.  PLAN: Continues to show improvement.  Decreased MS Contin to 15 mg every 12 hours (we will plan to wean to 15mg  once daily if pain remains controlled with a goal of discontinuing). Tolerating well. Some complaints of lower back pain which is being managed with use of lidocaine patches. Dronabinol 5 mg twice daily for appetite Ongoing symptom management (see above) I will plan to see patient back in 4-6 weeks in collaboration with her other appointments.    Patient expressed understanding and was in agreement with this plan. She also understands that She can call the clinic at any time with any questions, concerns, or complaints.    Problems Addressed: One acute or chronic illness or injury with exacerbation, progression, cancer, and pain.    Amount and/or Complexity of Data: Category 3:Discussion of management or test interpretation with external physician/other qualified health  care professional/appropriate source (not separately reported). Any controlled substances utilized were prescribed in the context of palliative care.  Time Total: 40 min   Visit consisted of counseling and education dealing with the complex and emotionally intense issues of symptom management and palliative care in the setting of serious and potentially life-threatening illness.Greater than 50%  of this time was spent counseling and coordinating care related to the above assessment and plan.  Alda Lea, AGPCNP-BC  Palliative Medicine Team/[facility]

## 2021-11-09 NOTE — Patient Instructions (Signed)
PLEASE OBTAIN DENTAL CLEARANCE FROM DENTIST PRIOR TO NEXT APPT  [facility]  Discharge Instructions: Thank you for choosing [facility] to provide your oncology and hematology care.   If you have a lab appointment with the [facility], please go directly to the [facility] and check in at the registration area.   Wear comfortable clothing and clothing appropriate for easy access to any Portacath or PICC line.   We strive to give you quality time with your provider. You may need to reschedule your appointment if you arrive late (15 or more minutes).  Arriving late affects you and other patients whose appointments are after yours.  Also, if you miss three or more appointments without notifying the office, you may be dismissed from the clinic at the provider's discretion.      For prescription refill requests, have your pharmacy contact our office and allow 72 hours for refills to be completed.    Today you received the following chemotherapy and/or immunotherapy agents: Alimta      To help prevent nausea and vomiting after your treatment, we encourage you to take your nausea medication as directed.  BELOW ARE SYMPTOMS THAT SHOULD BE REPORTED IMMEDIATELY: *FEVER GREATER THAN 100.4 F (38 C) OR HIGHER *CHILLS OR SWEATING *NAUSEA AND VOMITING THAT IS NOT CONTROLLED WITH YOUR NAUSEA MEDICATION *UNUSUAL SHORTNESS OF BREATH *UNUSUAL BRUISING OR BLEEDING *URINARY PROBLEMS (pain or burning when urinating, or frequent urination) *BOWEL PROBLEMS (unusual diarrhea, constipation, pain near the anus) TENDERNESS IN MOUTH AND THROAT WITH OR WITHOUT PRESENCE OF ULCERS (sore throat, sores in mouth, or a toothache) UNUSUAL RASH, SWELLING OR PAIN  UNUSUAL VAGINAL DISCHARGE OR ITCHING   Items with * indicate a potential emergency and should be followed up as soon as possible or go to the Emergency Department if any problems should occur.  Please show the  CHEMOTHERAPY ALERT CARD or IMMUNOTHERAPY ALERT CARD at check-in to the Emergency Department and triage nurse.  Should you have questions after your visit or need to cancel or reschedule your appointment, please contact [facility]  Dept: [phone]  and follow the prompts.  Office hours are 8:00 a.m. to 4:30 p.m. Monday - Friday. Please note that voicemails left after 4:00 p.m. may not be returned until the following business day.  We are closed weekends and major holidays. You have access to a nurse at all times for urgent questions. Please call the main number to the clinic Dept: [phone] and follow the prompts.   For any non-urgent questions, you may also contact your provider using MyChart. We now offer e-Visits for anyone 66 and older to request care online for non-urgent symptoms. For details visit [email or web address].   Also download the MyChart app! Go to the app store, search "MyChart", open the app, select [facility], and log in with your MyChart username and password.  Masks are optional in the cancer centers. If you would like for your care team to wear a mask while they are taking care of you, please let them know. For doctor visits, patients may have with them one support person who is at least 75 years old. At this time, visitors are not allowed in the infusion area.

## 2021-11-11 ENCOUNTER — Other Ambulatory Visit: Payer: Self-pay | Admitting: Physician Assistant

## 2021-11-11 ENCOUNTER — Telehealth: Payer: Self-pay | Admitting: Medical Oncology

## 2021-11-11 DIAGNOSIS — R748 Abnormal levels of other serum enzymes: Secondary | ICD-10-CM

## 2021-11-12 DIAGNOSIS — C3491 Malignant neoplasm of unspecified part of right bronchus or lung: Secondary | ICD-10-CM | POA: Diagnosis not present

## 2021-11-14 ENCOUNTER — Telehealth: Payer: Self-pay

## 2021-11-14 NOTE — Telephone Encounter (Signed)
Pt husband, craig, called reported pt pain is still not managed. Per [name], NP pt can return to taking 2 MS Contin (30mg ) in the Am and in the PM. Pt and husband verbalized undertstanding. No further concerns.

## 2021-11-15 ENCOUNTER — Other Ambulatory Visit: Payer: Self-pay

## 2021-11-15 MED ORDER — ONDANSETRON 4 MG PO TBDP: 4.0000 mg | ORAL_TABLET | Freq: Two times a day (BID) | ORAL | 1 refills | Status: DC | PRN

## 2021-11-16 DIAGNOSIS — Z681 Body mass index (BMI) 19 or less, adult: Secondary | ICD-10-CM | POA: Diagnosis not present

## 2021-11-16 DIAGNOSIS — C3490 Malignant neoplasm of unspecified part of unspecified bronchus or lung: Secondary | ICD-10-CM | POA: Diagnosis not present

## 2021-11-16 DIAGNOSIS — M546 Pain in thoracic spine: Secondary | ICD-10-CM | POA: Diagnosis not present

## 2021-11-16 DIAGNOSIS — M0579 Rheumatoid arthritis with rheumatoid factor of multiple sites without organ or systems involvement: Secondary | ICD-10-CM | POA: Diagnosis not present

## 2021-11-16 DIAGNOSIS — M154 Erosive (osteo)arthritis: Secondary | ICD-10-CM | POA: Diagnosis not present

## 2021-11-16 DIAGNOSIS — M353 Polymyalgia rheumatica: Secondary | ICD-10-CM | POA: Diagnosis not present

## 2021-11-18 DIAGNOSIS — C3490 Malignant neoplasm of unspecified part of unspecified bronchus or lung: Secondary | ICD-10-CM | POA: Diagnosis not present

## 2021-11-21 ENCOUNTER — Other Ambulatory Visit: Payer: Self-pay | Admitting: Nurse Practitioner

## 2021-11-21 ENCOUNTER — Telehealth: Payer: Self-pay

## 2021-11-21 DIAGNOSIS — C7951 Secondary malignant neoplasm of bone: Secondary | ICD-10-CM

## 2021-11-21 MED ORDER — MORPHINE SULFATE ER 30 MG PO TBCR: 30.0000 mg | EXTENDED_RELEASE_TABLET | Freq: Two times a day (BID) | ORAL | 0 refills | Status: DC

## 2021-11-21 NOTE — Telephone Encounter (Signed)
Pt husband called requesting a refill of morphine for pt. [facility], no further needs.

## 2021-11-23 ENCOUNTER — Other Ambulatory Visit: Payer: Self-pay | Admitting: *Deleted

## 2021-11-23 ENCOUNTER — Telehealth: Payer: Self-pay | Admitting: *Deleted

## 2021-11-23 MED ORDER — ESCITALOPRAM OXALATE 20 MG PO TABS: 20.0000 mg | ORAL_TABLET | Freq: Every day | ORAL | 1 refills | Status: DC

## 2021-11-23 NOTE — Telephone Encounter (Signed)
Left message for pt to return my call.  Please offer pt a medicare wellness visit when she calls back.

## 2021-11-30 ENCOUNTER — Inpatient Hospital Stay: Payer: Medicare HMO

## 2021-11-30 ENCOUNTER — Encounter: Payer: Self-pay | Admitting: Nurse Practitioner

## 2021-11-30 ENCOUNTER — Other Ambulatory Visit: Payer: Self-pay

## 2021-11-30 ENCOUNTER — Inpatient Hospital Stay (HOSPITAL_BASED_OUTPATIENT_CLINIC_OR_DEPARTMENT_OTHER): Payer: Medicare HMO | Admitting: Internal Medicine

## 2021-11-30 ENCOUNTER — Inpatient Hospital Stay: Payer: Medicare HMO | Attending: Internal Medicine

## 2021-11-30 ENCOUNTER — Encounter: Payer: Self-pay | Admitting: Internal Medicine

## 2021-11-30 ENCOUNTER — Inpatient Hospital Stay (HOSPITAL_BASED_OUTPATIENT_CLINIC_OR_DEPARTMENT_OTHER): Payer: Medicare HMO | Admitting: Nurse Practitioner

## 2021-11-30 VITALS — BP 120/60 | HR 71 | Temp 98.3°F | Resp 18 | Ht 66.0 in | Wt 91.3 lb

## 2021-11-30 DIAGNOSIS — C787 Secondary malignant neoplasm of liver and intrahepatic bile duct: Secondary | ICD-10-CM | POA: Diagnosis not present

## 2021-11-30 DIAGNOSIS — C3491 Malignant neoplasm of unspecified part of right bronchus or lung: Secondary | ICD-10-CM

## 2021-11-30 DIAGNOSIS — Z5111 Encounter for antineoplastic chemotherapy: Secondary | ICD-10-CM

## 2021-11-30 DIAGNOSIS — G893 Neoplasm related pain (acute) (chronic): Secondary | ICD-10-CM | POA: Diagnosis not present

## 2021-11-30 DIAGNOSIS — Z515 Encounter for palliative care: Secondary | ICD-10-CM | POA: Diagnosis not present

## 2021-11-30 DIAGNOSIS — K5900 Constipation, unspecified: Secondary | ICD-10-CM

## 2021-11-30 DIAGNOSIS — C3411 Malignant neoplasm of upper lobe, right bronchus or lung: Secondary | ICD-10-CM | POA: Insufficient documentation

## 2021-11-30 DIAGNOSIS — C7951 Secondary malignant neoplasm of bone: Secondary | ICD-10-CM | POA: Insufficient documentation

## 2021-11-30 DIAGNOSIS — C7971 Secondary malignant neoplasm of right adrenal gland: Secondary | ICD-10-CM | POA: Insufficient documentation

## 2021-11-30 DIAGNOSIS — R530 Neoplastic (malignant) related fatigue: Secondary | ICD-10-CM

## 2021-11-30 LAB — CBC WITH DIFFERENTIAL (CANCER CENTER ONLY)
Abs Immature Granulocytes: 0.01 10*3/uL (ref 0.00–0.07)
Basophils Absolute: 0 10*3/uL (ref 0.0–0.1)
Basophils Relative: 1 %
Eosinophils Absolute: 0.2 10*3/uL (ref 0.0–0.5)
Eosinophils Relative: 5 %
HCT: 35.5 % — ABNORMAL LOW (ref 36.0–46.0)
Hemoglobin: 11.5 g/dL — ABNORMAL LOW (ref 12.0–15.0)
Immature Granulocytes: 0 %
Lymphocytes Relative: 28 %
Lymphs Abs: 1.2 10*3/uL (ref 0.7–4.0)
MCH: 32.9 pg (ref 26.0–34.0)
MCHC: 32.4 g/dL (ref 30.0–36.0)
MCV: 101.4 fL — ABNORMAL HIGH (ref 80.0–100.0)
Monocytes Absolute: 0.6 10*3/uL (ref 0.1–1.0)
Monocytes Relative: 15 %
Neutro Abs: 2.2 10*3/uL (ref 1.7–7.7)
Neutrophils Relative %: 51 %
Platelet Count: 238 10*3/uL (ref 150–400)
RBC: 3.5 MIL/uL — ABNORMAL LOW (ref 3.87–5.11)
RDW: 14.3 % (ref 11.5–15.5)
WBC Count: 4.2 10*3/uL (ref 4.0–10.5)
nRBC: 0 % (ref 0.0–0.2)

## 2021-11-30 LAB — CMP (CANCER CENTER ONLY)
ALT: 10 U/L (ref 0–44)
AST: 19 U/L (ref 15–41)
Albumin: 3.9 g/dL (ref 3.5–5.0)
Alkaline Phosphatase: 60 U/L (ref 38–126)
Anion gap: 7 (ref 5–15)
BUN: 10 mg/dL (ref 8–23)
CO2: 32 mmol/L (ref 22–32)
Calcium: 9.5 mg/dL (ref 8.9–10.3)
Chloride: 101 mmol/L (ref 98–111)
Creatinine: 0.69 mg/dL (ref 0.44–1.00)
GFR, Estimated: >60 mL/min (ref >60)
Glucose, Bld: 137 mg/dL — ABNORMAL HIGH (ref 70–99)
Potassium: 3.8 mmol/L (ref 3.5–5.1)
Sodium: 140 mmol/L (ref 135–145)
Total Bilirubin: 0.4 mg/dL (ref 0.3–1.2)
Total Protein: 7 g/dL (ref 6.5–8.1)

## 2021-11-30 LAB — VITAMIN B12: Vitamin B-12: 1305 pg/mL — ABNORMAL HIGH (ref 180–914)

## 2021-11-30 MED ORDER — SODIUM CHLORIDE 0.9% FLUSH: 10.0000 mL | INTRAVENOUS | Status: DC | PRN

## 2021-11-30 MED ORDER — HEPARIN SOD (PORK) LOCK FLUSH 100 UNIT/ML IV SOLN: 500.0000 [IU] | Freq: Once | INTRAVENOUS | Status: DC | PRN

## 2021-11-30 MED ORDER — SODIUM CHLORIDE 0.9 % IV SOLN
500.0000 mg/m2 | Freq: Once | INTRAVENOUS | Status: AC
  Administered 2021-11-30: 700 mg via INTRAVENOUS
  Filled 2021-11-30: qty 20

## 2021-11-30 MED ORDER — SODIUM CHLORIDE 0.9 % IV SOLN: INTRAVENOUS | Status: DC

## 2021-11-30 NOTE — Progress Notes (Signed)
[facility]  [phone]) [phone] [phone]) [phone]   Name: Kristi Jennings Date: 11/30/2021 MRN: [id number]  DOB: 01-30-1946  Patient Care Team: [name], NP as PCP - General (Family Medicine) [facility] as Registered Nurse [facility] and Palliative Medicine)    INTERVAL HISTORY: Kristi Jennings is a 76 y.o. 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 Palliative ask to see for symptom management.  SOCIAL HISTORY:     reports that she quit smoking about 13 years ago. Her smoking use included cigarettes. She has a 43.00 pack-year smoking history. She has never used smokeless tobacco. She reports that she does not currently use alcohol. She reports that she does not use drugs.  ADVANCE DIRECTIVES:  None on file   CODE STATUS:   PAST MEDICAL HISTORY: Past Medical History:  Diagnosis Date   Borderline diabetes mellitus    Bronchogenic cancer of right lung ([place])    Dyslipidemia    History of radiation therapy    right chest 12/21/2020-12/24/2020  Dr Gery Pray   History of radiation therapy    right lung 06/07/2021-06/17/2021  Dr Gery Pray   Hypertension    Polymyalgia rheumatica ([place])    ALLERGIES:  is allergic to shellfish-derived products and augmentin [amoxicillin-pot clavulanate].  MEDICATIONS:  Current Outpatient Medications  Medication Sig Dispense Refill   acetaminophen (TYLENOL) 650 MG CR tablet Take 1,300 mg by mouth 3 (three) times daily as needed for pain.     aspirin EC 81 MG tablet Take 81 mg by mouth 4 (four) times a week. Swallow whole. (Patient not taking: Reported on 11/09/2021)     atenolol (TENORMIN) 25 MG tablet Take 12.5 mg by mouth at bedtime.     Calcium-Vitamin D-Vitamin K (CALCIUM + D) [phone] MG-UNT-MCG CHEW      Cholecalciferol (VITAMIN D) 50 MCG (2000 UT) tablet      diclofenac  Sodium (VOLTAREN) 1 % GEL Apply 4 g topically 4 (four) times daily. (Patient taking differently: Apply 4 g topically 4 (four) times daily as needed (pain).) 150 g 0   dronabinol (MARINOL) 5 MG capsule Take 1 capsule (5 mg total) by mouth 2 (two) times daily before a meal. 60 capsule 1   enoxaparin (LOVENOX) 60 MG/0.6ML injection Inject 0.6 mLs (60 mg total) into the skin daily. 18 mL 2   escitalopram (LEXAPRO) 20 MG tablet Take 1 tablet (20 mg total) by mouth at bedtime. 30 tablet 1   fexofenadine (ALLEGRA) 180 MG tablet Take 180 mg by mouth daily.     fluocinonide (LIDEX) 0.05 % external solution Apply topically daily.     folic acid (FOLVITE) 1 MG tablet TAKE ONE TABLET BY MOUTH ONE TIME DAILY 30 tablet 2   lidocaine (LIDODERM) 5 % Place 1 patch onto the skin daily. Remove & Discard patch within 12 hours or as directed by MD (Patient not taking: Reported on 11/09/2021) 30 patch 0   mirtazapine (REMERON) 15 MG tablet Take 15 mg by mouth at bedtime.     morphine (MS CONTIN) 30 MG 12 hr tablet Take 1 tablet (30 mg total) by mouth every 12 (twelve) hours. 60 tablet 0   Multiple Vitamin (MULTI VITAMIN) TABS      ondansetron (ZOFRAN-ODT) 4 MG disintegrating tablet Take 1 tablet (4 mg total) by mouth 2 (two) times daily as needed for nausea or vomiting. (  Patient not taking: Reported on 11/30/2021) 20 tablet 1   predniSONE (DELTASONE) 1 MG tablet Take 3 mg by mouth daily.     predniSONE (DELTASONE) 5 MG tablet Take 5 mg by mouth daily.     Salicylic Acid (SCALPICIN EX) Apply 1 application. topically daily as needed.     senna-docusate (SENOKOT-S) 8.6-50 MG tablet Take 1 tablet by mouth 2 (two) times daily. 30 tablet 2   triamcinolone ointment (KENALOG) 0.1 % Apply 1 application. topically 2 (two) times daily.     No current facility-administered medications for this visit.   Facility-Administered Medications Ordered in Other Visits  Medication Dose Route Frequency Provider Last Rate Last Admin   0.9 %   sodium chloride infusion   Intravenous Continuous Curt Bears, MD   Stopped at 11/30/21 1056   heparin lock flush 100 unit/mL  500 Units Intracatheter Once PRN Curt Bears, MD       sodium chloride flush (NS) 0.9 % injection 10 mL  10 mL Intracatheter PRN Curt Bears, MD        VITAL SIGNS: There were no vitals taken for this visit. There were no vitals filed for this visit.   Estimated body mass index is 14.74 kg/m as calculated from the following:   Height as of an earlier encounter on 11/30/21: 5\' 6"  (1.676 m).   Weight as of an earlier encounter on 11/30/21: 91 lb 4.8 oz (41.4 kg).  PERFORMANCE STATUS (ECOG) : 1 - Symptomatic but completely ambulatory  Physical Exam General: NAD, ambulatory Resp: Normal breathing pattern Neurological: AAO x4, mood appropriate   IMPRESSION:  Kristi Jennings presents to clinic today with her husband for ongoing symptom management support. Scheduled for infusion. Tolerating treatments. Is feeling much better. Activity levels have increased.   Pain Kristi Jennings reports pain is much improved. We initially weaned her down to MS Contin 15mg  once daily however due to reoccurring pain and discomfort required to restart twice daily dosing. Fortunately pain has improved. She is not having to use breakthrough medication. Activity level has increased.   We discussed plans to re-attempt weaning down MS Contin in the near future. She and husband verbalized understanding and appreciation.   We will plan to evaluate at follow-up, if pain remains well controlled/resolved will decrease to once daily and then discontinue. Will continue to monitor closely.   Constipation No concerns with constipation.  Continues to take MiraLAX and senna as needed for bowel regimen.  Anxiety Her anxiety has decreased significantly mainly due to to her improved wellbeing and decreased pain.  She states she is focusing on continued improvement and hopes things remain stable.    Decreased appetite/Weight loss Appetite is improving. Able to eat foods that she enjoy and snacking throughout the day.   Her weight has increased back up to 91lbs. 88lbs at last visit down from 91 lbs 5/30, 89 lbs 4/19,  88.8 lbs on 3/29, 90 lbs on 3/8,  88lbs 2/15, 89lbs on 1/25 and 92lb on 1/11. Tolerating dronabinol. Will continue to monitor.   Overall patient is doing much better and symptom burden has drastically decreased and almost resolved..  We will continue to support and follow as needed.  PLAN: Symptom much improved.  Continue MS Contin as prescribed.  Dronabinol 5 mg twice daily for appetite Ongoing symptom management (see above) will plan to re-attempt weaning of MS Contin given pain is resolving again.  I will plan to see patient back in 4-6 weeks in collaboration with her other  appointments.    Patient expressed understanding and was in agreement with this plan. She also understands that She can call the clinic at any time with any questions, concerns, or complaints.    Any controlled substances utilized were prescribed in the context of palliative care. PDMP has been reviewed.    Time Total: 40 min   Visit consisted of counseling and education dealing with the complex and emotionally intense issues of symptom management and palliative care in the setting of serious and potentially life-threatening illness.Greater than 50%  of this time was spent counseling and coordinating care related to the above assessment and plan.  Alda Lea, AGPCNP-BC  Palliative Medicine Team/[facility]

## 2021-11-30 NOTE — Patient Instructions (Signed)
[facility] ONCOLOGY  Discharge Instructions: Thank you for choosing [facility] to provide your oncology and hematology care.   If you have a lab appointment with the [facility], please go directly to the [facility] and check in at the registration area.   Wear comfortable clothing and clothing appropriate for easy access to any Portacath or PICC line.   We strive to give you quality time with your provider. You may need to reschedule your appointment if you arrive late (15 or more minutes).  Arriving late affects you and other patients whose appointments are after yours.  Also, if you miss three or more appointments without notifying the office, you may be dismissed from the clinic at the provider's discretion.      For prescription refill requests, have your pharmacy contact our office and allow 72 hours for refills to be completed.    Today you received the following chemotherapy and/or immunotherapy agents: Alimta.      To help prevent nausea and vomiting after your treatment, we encourage you to take your nausea medication as directed.  BELOW ARE SYMPTOMS THAT SHOULD BE REPORTED IMMEDIATELY: *FEVER GREATER THAN 100.4 F (38 C) OR HIGHER *CHILLS OR SWEATING *NAUSEA AND VOMITING THAT IS NOT CONTROLLED WITH YOUR NAUSEA MEDICATION *UNUSUAL SHORTNESS OF BREATH *UNUSUAL BRUISING OR BLEEDING *URINARY PROBLEMS (pain or burning when urinating, or frequent urination) *BOWEL PROBLEMS (unusual diarrhea, constipation, pain near the anus) TENDERNESS IN MOUTH AND THROAT WITH OR WITHOUT PRESENCE OF ULCERS (sore throat, sores in mouth, or a toothache) UNUSUAL RASH, SWELLING OR PAIN  UNUSUAL VAGINAL DISCHARGE OR ITCHING   Items with * indicate a potential emergency and should be followed up as soon as possible or go to the Emergency Department if any problems should occur.  Please show the CHEMOTHERAPY ALERT CARD or IMMUNOTHERAPY ALERT CARD at check-in to  the Emergency Department and triage nurse.  Should you have questions after your visit or need to cancel or reschedule your appointment, please contact [facility]  Dept: [phone]  and follow the prompts.  Office hours are 8:00 a.m. to 4:30 p.m. Monday - Friday. Please note that voicemails left after 4:00 p.m. may not be returned until the following business day.  We are closed weekends and major holidays. You have access to a nurse at all times for urgent questions. Please call the main number to the clinic Dept: [phone] and follow the prompts.   For any non-urgent questions, you may also contact your provider using MyChart. We now offer e-Visits for anyone 35 and older to request care online for non-urgent symptoms. For details visit [email or web address].   Also download the MyChart app! Go to the app store, search "MyChart", open the app, select [facility], and log in with your MyChart username and password.  Masks are optional in the cancer centers. If you would like for your care team to wear a mask while they are taking care of you, please let them know. For doctor visits, patients may have with them one support person who is at least 75 years old. At this time, visitors are not allowed in the infusion area.

## 2021-11-30 NOTE — Progress Notes (Signed)
Per pt, pt and family are working on obtaining dental clearance for Zometa infusions.

## 2021-11-30 NOTE — Progress Notes (Signed)
[place] Telephone:([phone]) [phone]   [phone]) [phone]  OFFICE PROGRESS NOTE  [name], NP [address] [place] [address]  DIAGNOSIS: Stage IV (T3, N2, M1 C) non-small cell lung cancer with unknown histologic subtype.  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   DETECTED ALTERATION(S) / BIOMARKER(S)      % CFDNA OR AMPLIFICATION        ASSOCIATED FDA-APPROVED THERAPIES         CLINICAL TRIAL AVAILABILITY [id number] 8.9%   Sotorasib Yes [id number] 7.2% None     Yes     PRIOR THERAPY:  Palliative radiotherapy to the painful bone lesions under the care of Dr. Sondra Come.   CURRENT THERAPY:  1) Palliative systemic chemotherapy with carboplatin for AUC of 5, Alimta 500 Mg/M2 and Keytruda 200 Mg IV every 3 weeks.  First dose of treatment on December 29, 2020. Status post 16  cycles.  Starting from cycle #5, the patient is going to start maintenance Alimta and Keytruda. Keytruda discontinued from cycle #7 due to myalgias.  2) Zometa every 6 weeks starting on 02/10/21 3) Possible palliative radiation to the enlarging pulmonary mass under the care of Dr. Sondra Come.  INTERVAL HISTORY: Kristi Jennings 76 y.o. female returns to the clinic today for follow-up visit.  The patient is feeling fine today with no concerning complaints except for mild fatigue for few days after her chemotherapy. She was seen recently by her rheumatologist and x-rays of the spine showed suspicious compression fractures but these were not reported on the previous CT scan of the chest, abdomen and pelvis that were performed recently.  She definitely had some lytic and sclerotic lesions in the spine but no clear evidence of compression fracture were reported.  She denied having any current chest pain, shortness of breath, cough  or hemoptysis.  She denied having any fever or chills.  She has no nausea, vomiting, diarrhea or constipation.  She has no headache or visual changes.  She is here today for evaluation before starting cycle #17 of her treatment.   MEDICAL HISTORY: Past Medical History:  Diagnosis Date   Borderline diabetes mellitus    Bronchogenic cancer of right lung ([place])    Dyslipidemia    History of radiation therapy    right chest 12/21/2020-12/24/2020  Dr Gery Pray   History of radiation therapy    right lung 06/07/2021-06/17/2021  Dr Gery Pray   Hypertension    Polymyalgia rheumatica ([place])     ALLERGIES:  is allergic to shellfish-derived products and augmentin [amoxicillin-pot clavulanate].  MEDICATIONS:  Current Outpatient Medications  Medication Sig Dispense Refill   acetaminophen (TYLENOL) 650 MG CR tablet Take 1,300 mg by mouth 3 (three) times daily as needed for pain.     aspirin EC 81 MG tablet Take 81 mg by mouth 4 (four) times a week. Swallow whole. (Patient not taking: Reported on 11/09/2021)     atenolol (TENORMIN) 25 MG tablet Take 12.5 mg by mouth at bedtime.     Calcium-Vitamin D-Vitamin K (CALCIUM + D) [phone] MG-UNT-MCG CHEW      Cholecalciferol (VITAMIN D) 50 MCG (2000 UT) tablet      diclofenac Sodium (VOLTAREN) 1 % GEL Apply 4 g topically 4 (four) times daily. (Patient taking differently: Apply 4 g  topically 4 (four) times daily as needed (pain).) 150 g 0   dronabinol (MARINOL) 5 MG capsule Take 1 capsule (5 mg total) by mouth 2 (two) times daily before a meal. 60 capsule 1   enoxaparin (LOVENOX) 60 MG/0.6ML injection Inject 0.6 mLs (60 mg total) into the skin daily. 18 mL 2   escitalopram (LEXAPRO) 20 MG tablet Take 1 tablet (20 mg total) by mouth at bedtime. 30 tablet 1   fexofenadine (ALLEGRA) 180 MG tablet Take 180 mg by mouth daily.     fluocinonide (LIDEX) 0.05 % external solution Apply topically daily.     folic acid (FOLVITE) 1 MG tablet TAKE ONE TABLET BY MOUTH  ONE TIME DAILY 30 tablet 2   lidocaine (LIDODERM) 5 % Place 1 patch onto the skin daily. Remove & Discard patch within 12 hours or as directed by MD (Patient not taking: Reported on 11/09/2021) 30 patch 0   mirtazapine (REMERON) 15 MG tablet Take 15 mg by mouth at bedtime.     morphine (MS CONTIN) 30 MG 12 hr tablet Take 1 tablet (30 mg total) by mouth every 12 (twelve) hours. 60 tablet 0   Multiple Vitamin (MULTI VITAMIN) TABS      ondansetron (ZOFRAN-ODT) 4 MG disintegrating tablet Take 1 tablet (4 mg total) by mouth 2 (two) times daily as needed for nausea or vomiting. 20 tablet 1   predniSONE (DELTASONE) 1 MG tablet Take 3 mg by mouth daily.     predniSONE (DELTASONE) 5 MG tablet Take 5 mg by mouth daily.     Salicylic Acid (SCALPICIN EX) Apply 1 application. topically daily as needed.     senna-docusate (SENOKOT-S) 8.6-50 MG tablet Take 1 tablet by mouth 2 (two) times daily. 30 tablet 2   triamcinolone ointment (KENALOG) 0.1 % Apply 1 application. topically 2 (two) times daily.     No current facility-administered medications for this visit.    SURGICAL HISTORY: No past surgical history on file.  REVIEW OF SYSTEMS:  A comprehensive review of systems was negative except for: Constitutional: positive for fatigue   PHYSICAL EXAMINATION: General appearance: alert, cooperative, fatigued, and no distress Head: Normocephalic, without obvious abnormality, atraumatic Neck: no adenopathy, no JVD, supple, symmetrical, trachea midline, and thyroid not enlarged, symmetric, no tenderness/mass/nodules Lymph nodes: Cervical, supraclavicular, and axillary nodes normal. Resp: clear to auscultation bilaterally Back: symmetric, no curvature. ROM normal. No CVA tenderness. Cardio: regular rate and rhythm, S1, S2 normal, no murmur, click, rub or gallop GI: soft, non-tender; bowel sounds normal; no masses,  no organomegaly Extremities: extremities normal, atraumatic, no cyanosis or edema  ECOG PERFORMANCE  STATUS: 1 - Symptomatic but completely ambulatory  Blood pressure 120/60, pulse 71, temperature 98.3 F (36.8 C), temperature source Tympanic, resp. rate 18, height _0  (1.676 m), weight 91 lb 4.8 oz (41.4 kg), SpO2 91 %.  LABORATORY DATA: Lab Results  Component Value Date   WBC 4.2 11/30/2021   HGB 11.5 (L) 11/30/2021   HCT 35.5 (L) 11/30/2021   MCV 101.4 (H) 11/30/2021   PLT 238 11/30/2021      Chemistry      Component Value Date/Time   NA 139 11/09/2021 0836   K 3.7 11/09/2021 0836   CL 101 11/09/2021 0836   CO2 31 11/09/2021 0836   BUN 16 11/09/2021 0836   CREATININE 0.68 11/09/2021 0836      Component Value Date/Time   CALCIUM 9.7 11/09/2021 0836   ALKPHOS 57 11/09/2021 0836   AST 16  11/09/2021 0836   ALT 10 11/09/2021 0836   BILITOT 0.3 11/09/2021 0836       RADIOGRAPHIC STUDIES: CT Chest W Contrast  Result Date: 11/08/2021 CLINICAL DATA:  Primary Cancer Type: Lung Imaging Indication: Assess response to therapy Interval therapy since last imaging? Yes Initial Cancer Diagnosis Date: 10/2020 Detailed Pathology: Stage IV non-small cell lung cancer with unknown histologic subtype, likely adenocarcinoma. Primary Tumor location: Right upper lobe. Surgeries: Not on file. Chemotherapy: Yes; Ongoing? Yes; Most recent administration: 10/18/2021 Immunotherapy?  Yes; Type: Keytruda; Ongoing? No Radiation therapy? Yes Date Range: 06/07/2021 - 06/17/2021; Target: Right lung Date Range: 12/21/2020 - 12/24/2020; Target: Right chest * Tracking Code: BO * EXAM: CT CHEST, ABDOMEN AND PELVIS WITH CONTRAST TECHNIQUE: Multidetector CT imaging of the chest was performed during intravenous contrast administration. RADIATION DOSE REDUCTION: This exam was performed according to the departmental dose-optimization program which includes automated exposure control, adjustment of the mA and/or kV according to patient size and/or use of iterative reconstruction technique. CONTRAST:  116m OMNIPAQUE  IOHEXOL 300 MG/ML  SOLN COMPARISON:  Most recent CT chest, abdomen and pelvis 09/05/2021. 11/09/2020 PET-CT. FINDINGS: CT CHEST FINDINGS Cardiovascular: Aortic atherosclerosis. Unchanged enlargement of the tubular ascending thoracic aorta, measuring up to 4.2 x 4.2 cm. Normal heart size. Three-vessel coronary artery calcifications. No pericardial effusion. Mediastinum/Nodes: No enlarged mediastinal, hilar, or axillary lymph nodes. Thyroid gland, trachea, and esophagus demonstrate no significant findings. Lungs/Pleura: No significant change in a spiculated, treated mass of the apical right upper lobe associated volume loss, measuring 5.0 x 4.2 cm, a small cavitary component at the inferior aspect now fluid-filled (series 7, image 29). No pleural effusion or pneumothorax. Musculoskeletal: No chest wall mass. Unchanged lytic lesion of the anterior right third rib (series 7, image 54). Unchanged lytic lesion of the superior manubrium (series 6, image 84). Unchanged, pathologic mixed lytic and sclerotic wedge deformities of T4, T8, and T12 (series 6, image 88). CT ABDOMEN PELVIS FINDINGS Hepatobiliary: Unchanged hypodense lesion of the posterior right lobe of the liver, hepatic segment VII, measuring 1.1 x 1.0 cm (series 2, image 51). No gallstones, gallbladder wall thickening, or biliary dilatation. Pancreas: Unremarkable. No pancreatic ductal dilatation or surrounding inflammatory changes. Spleen: Normal in size without significant abnormality. Adrenals/Urinary Tract: Slight interval enlargement of a right adrenal lesion measuring 3.8 x 2.8 cm, previously 3.3 x 2.3 cm when measured similarly (series 2, image 51). Kidneys are normal, without renal calculi, solid lesion, or hydronephrosis. Bladder is unremarkable. Stomach/Bowel: Stomach is within normal limits. Appendix not clearly visualized. No evidence of bowel wall thickening, distention, or inflammatory changes. Vascular/Lymphatic: Aortic atherosclerosis. No  enlarged abdominal or pelvic lymph nodes. Reproductive: No mass or other abnormality. Other: No abdominal wall hernia or abnormality. No ascites. Musculoskeletal: No acute osseous findings. IMPRESSION: 1. No significant change in a spiculated, treated mass of the apical right upper lobe. 2. Unchanged metastasis of the posterior right lobe of the liver. 3. Slight interval enlargement of a metastatic right adrenal mass. 4. Unchanged mixed lytic and sclerotic osseous metastatic disease, including presumably pathologic wedge deformities of T4, T8, and T12. 5. Unchanged enlargement of the tubular ascending thoracic aorta, measuring up to 4.2 x 4.2 cm. Recommend annual imaging followup by CTA or MRA if not otherwise imaged and clinically appropriate. This recommendation follows 2010 ACCF/AHA/AATS/ACR/ASA/SCA/SCAI/SIR/STS/SVM Guidelines for the Diagnosis and Management of Patients with Thoracic Aortic Disease. Circulation. 2010; 121:: [id number] Aortic aneurysm NOS (ICD10-I71.9) 6. Coronary artery disease. Aortic Atherosclerosis (ICD10-I70.0). Electronically Signed   By:  Delanna Ahmadi M.D.   On: 11/08/2021 11:43   CT Abdomen Pelvis W Contrast  Result Date: 11/08/2021 CLINICAL DATA:  Primary Cancer Type: Lung Imaging Indication: Assess response to therapy Interval therapy since last imaging? Yes Initial Cancer Diagnosis Date: 10/2020 Detailed Pathology: Stage IV non-small cell lung cancer with unknown histologic subtype, likely adenocarcinoma. Primary Tumor location: Right upper lobe. Surgeries: Not on file. Chemotherapy: Yes; Ongoing? Yes; Most recent administration: 10/18/2021 Immunotherapy?  Yes; Type: Keytruda; Ongoing? No Radiation therapy? Yes Date Range: 06/07/2021 - 06/17/2021; Target: Right lung Date Range: 12/21/2020 - 12/24/2020; Target: Right chest * Tracking Code: BO * EXAM: CT CHEST, ABDOMEN AND PELVIS WITH CONTRAST TECHNIQUE: Multidetector CT imaging of the chest was performed during intravenous contrast  administration. RADIATION DOSE REDUCTION: This exam was performed according to the departmental dose-optimization program which includes automated exposure control, adjustment of the mA and/or kV according to patient size and/or use of iterative reconstruction technique. CONTRAST:  145m OMNIPAQUE IOHEXOL 300 MG/ML  SOLN COMPARISON:  Most recent CT chest, abdomen and pelvis 09/05/2021. 11/09/2020 PET-CT. FINDINGS: CT CHEST FINDINGS Cardiovascular: Aortic atherosclerosis. Unchanged enlargement of the tubular ascending thoracic aorta, measuring up to 4.2 x 4.2 cm. Normal heart size. Three-vessel coronary artery calcifications. No pericardial effusion. Mediastinum/Nodes: No enlarged mediastinal, hilar, or axillary lymph nodes. Thyroid gland, trachea, and esophagus demonstrate no significant findings. Lungs/Pleura: No significant change in a spiculated, treated mass of the apical right upper lobe associated volume loss, measuring 5.0 x 4.2 cm, a small cavitary component at the inferior aspect now fluid-filled (series 7, image 29). No pleural effusion or pneumothorax. Musculoskeletal: No chest wall mass. Unchanged lytic lesion of the anterior right third rib (series 7, image 54). Unchanged lytic lesion of the superior manubrium (series 6, image 84). Unchanged, pathologic mixed lytic and sclerotic wedge deformities of T4, T8, and T12 (series 6, image 88). CT ABDOMEN PELVIS FINDINGS Hepatobiliary: Unchanged hypodense lesion of the posterior right lobe of the liver, hepatic segment VII, measuring 1.1 x 1.0 cm (series 2, image 51). No gallstones, gallbladder wall thickening, or biliary dilatation. Pancreas: Unremarkable. No pancreatic ductal dilatation or surrounding inflammatory changes. Spleen: Normal in size without significant abnormality. Adrenals/Urinary Tract: Slight interval enlargement of a right adrenal lesion measuring 3.8 x 2.8 cm, previously 3.3 x 2.3 cm when measured similarly (series 2, image 51). Kidneys are  normal, without renal calculi, solid lesion, or hydronephrosis. Bladder is unremarkable. Stomach/Bowel: Stomach is within normal limits. Appendix not clearly visualized. No evidence of bowel wall thickening, distention, or inflammatory changes. Vascular/Lymphatic: Aortic atherosclerosis. No enlarged abdominal or pelvic lymph nodes. Reproductive: No mass or other abnormality. Other: No abdominal wall hernia or abnormality. No ascites. Musculoskeletal: No acute osseous findings. IMPRESSION: 1. No significant change in a spiculated, treated mass of the apical right upper lobe. 2. Unchanged metastasis of the posterior right lobe of the liver. 3. Slight interval enlargement of a metastatic right adrenal mass. 4. Unchanged mixed lytic and sclerotic osseous metastatic disease, including presumably pathologic wedge deformities of T4, T8, and T12. 5. Unchanged enlargement of the tubular ascending thoracic aorta, measuring up to 4.2 x 4.2 cm. Recommend annual imaging followup by CTA or MRA if not otherwise imaged and clinically appropriate. This recommendation follows 2010 ACCF/AHA/AATS/ACR/ASA/SCA/SCAI/SIR/STS/SVM Guidelines for the Diagnosis and Management of Patients with Thoracic Aortic Disease. Circulation. 2010; 121:: [id number] Aortic aneurysm NOS (ICD10-I71.9) 6. Coronary artery disease. Aortic Atherosclerosis (ICD10-I70.0). Electronically Signed   By: ADelanna AhmadiM.D.   On: 11/08/2021 11:43  ASSESSMENT AND PLAN: This is a very pleasant 76 years old white female diagnosed with Stage IV (T3, N2, M1 C) non-small cell lung cancer with unknown histologic subtype, likely adenocarcinoma with positive KRAS G12C mutation.  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  Status post palliative radiotherapy to the painful bone lesions under the care of Dr. Sondra Come.  The  patient started induction systemic chemotherapy with carboplatin for AUC of 5, Alimta 500 Mg/M2 and Keytruda 200 Mg IV every 3 weeks and currently on maintenance treatment with single agent Alimta after Keytruda was discontinued starting from cycle #7 because of significant arthralgia and myalgia.  She is status post 16 cycles of total treatment. She underwent palliative radiotherapy to enlarging pulmonary nodule under the care of Dr. Sondra Come. The patient has been tolerating her treatment with Alimta fairly well with no concerning adverse effect except for mild fatigue that last few days after the treatment. I recommended for her to proceed with cycle #17 today as planned. For the pain management and lack of appetite, she is followed by the palliative care team and she will get refill of her MS Contin at a reduced dose and also Marinol from them. For the bone disease, she was asked to get clearance from her dentist before proceeding with any additional treatment with Zometa. The patient will come back for follow-up visit in 3 weeks for evaluation before the next cycle of her treatment. The patient was advised to call immediately if she has any other concerning symptoms in the interval. The patient voices understanding of current disease status and treatment options and is in agreement with the current care plan.  All questions were answered. The patient knows to call the clinic with any problems, questions or concerns. We can certainly see the patient much sooner if necessary.  The total time spent in the appointment was 20 minutes.  Disclaimer: This note was dictated with voice recognition software. Similar sounding words can inadvertently be transcribed and may not be corrected upon review.

## 2021-12-01 DIAGNOSIS — Z681 Body mass index (BMI) 19 or less, adult: Secondary | ICD-10-CM | POA: Diagnosis not present

## 2021-12-01 DIAGNOSIS — C3490 Malignant neoplasm of unspecified part of unspecified bronchus or lung: Secondary | ICD-10-CM | POA: Diagnosis not present

## 2021-12-01 DIAGNOSIS — M810 Age-related osteoporosis without current pathological fracture: Secondary | ICD-10-CM | POA: Diagnosis not present

## 2021-12-01 DIAGNOSIS — M353 Polymyalgia rheumatica: Secondary | ICD-10-CM | POA: Diagnosis not present

## 2021-12-01 DIAGNOSIS — R768 Other specified abnormal immunological findings in serum: Secondary | ICD-10-CM | POA: Diagnosis not present

## 2021-12-01 DIAGNOSIS — M154 Erosive (osteo)arthritis: Secondary | ICD-10-CM | POA: Diagnosis not present

## 2021-12-01 DIAGNOSIS — M4854XA Collapsed vertebra, not elsewhere classified, thoracic region, initial encounter for fracture: Secondary | ICD-10-CM | POA: Diagnosis not present

## 2021-12-02 ENCOUNTER — Telehealth: Payer: Self-pay

## 2021-12-02 ENCOUNTER — Telehealth: Payer: Self-pay | Admitting: Medical Oncology

## 2021-12-02 NOTE — Telephone Encounter (Signed)
Pt husband called informing us of pt visit with rheumatologist. Pt and husband discussing a new bone density exam and possibly starting reclast or prolia. Pt husband call transferred to Dr.Mohammed desk RN for further discussion.

## 2021-12-02 NOTE — Telephone Encounter (Signed)
Osteoporosis tx- Dr Trudie Reed recommended Reclast annually or Prolia q 6 months.  (Pt takes Zometa every 12 weeks).  Does Kristi Jennings have an opinion on her taking one of these drugs?  Dr Trudie Reed will consider scheduling another bone density scan as last one was in 2021.

## 2021-12-05 ENCOUNTER — Telehealth: Payer: Self-pay | Admitting: *Deleted

## 2021-12-05 NOTE — Telephone Encounter (Signed)
Received message from [facility] wanting to speak with Dr. Worthy Flank nurse about pt. Called Cindy back and left message on voice mail requesting a call back.  Cindy's   phone    [phone]  ext   [phone].

## 2021-12-05 NOTE — Telephone Encounter (Signed)
Dr Trudie Reed understood pt receives Zometa here and and she will not  order Prolia or reclast for pt.

## 2021-12-06 ENCOUNTER — Telehealth: Payer: Self-pay

## 2021-12-06 DIAGNOSIS — C3491 Malignant neoplasm of unspecified part of right bronchus or lung: Secondary | ICD-10-CM | POA: Diagnosis not present

## 2021-12-06 NOTE — Telephone Encounter (Signed)
Pt's husband called to advise that he was faxing completed Dental Clearance form from dental provider. Fax received. From Dental standpoint, OK to proceed with Zometa.  Will send dental clearance to be scanned into chart.

## 2021-12-12 DIAGNOSIS — C3491 Malignant neoplasm of unspecified part of right bronchus or lung: Secondary | ICD-10-CM | POA: Diagnosis not present

## 2021-12-15 NOTE — Progress Notes (Signed)
[facility] OFFICE PROGRESS NOTE  [name], NP [address] [place] [address]  DIAGNOSIS:  Stage IV (T3, N2, M1 C) non-small cell lung cancer with unknown histologic subtype.  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   DETECTED ALTERATION(S) / BIOMARKER(S)      % CFDNA OR AMPLIFICATION        ASSOCIATED FDA-APPROVED THERAPIES         CLINICAL TRIAL AVAILABILITY [id number] 8.9%   Sotorasib Yes [id number] 7.2% None     Yes    PRIOR THERAPY: 1) Palliative radiotherapy to the painful bone lesions under the care of Dr. Sondra Come. 2) palliative radiation to the enlarging pulmonary mass under the care of Dr. Sondra Come. Last dose on 06/17/21  CURRENT THERAPY: 1) Palliative systemic chemotherapy with carboplatin for AUC of 5, Alimta 500 Mg/M2 and Keytruda 200 Mg IV every 3 weeks.  First dose of treatment on December 29, 2020. Status post 17 cycles.  Starting from cycle #5, the patient is going to start maintenance Alimta and Keytruda. Keytruda discontinued from cycle #7 due to myalgias.  2) Zometa every 12 weeks starting on 02/10/21  INTERVAL HISTORY: STACEYANN KNOUFF 76 y.o. female returns  to the clinic today for a follow-up visit accompanied by her husband.  The patient denies any concerning complaints today.  The patient was recently found to have new suspicious compression fractures which was found by her rheumatologist on x-rays of the spine according to her most recent note from 11/30/21.  The patient did receive dental clearance to continue Zometa.  This was recently received by our office on 12/06/2021.  The patient's husband mentions that she is overdue for DEXA scan.  They are wondering if the patient should be having this and who would order this.  The patient is also followed closely by  rheumatology for diffuse myalgias.  She is taking prednisone.   She also follows closely with palliative care regarding decreased appetite and weight loss and nutrition.  She takes Marinol for her appetite.  She has noticed improvement in her appetite. Her weight is 91.5 lbs today. She is hoping to continue to gain more weight and was disappointed today that she did not gain more. She reports continued mid-back pain that is well controlled by MS Contin with the ultimate goal of discontinuing this completely in the future. She is now on a decreased dose of MS Contin (15 mg now, 30 mg prior). They are going to run out over the weekend and are wondering it she can get a refill at the end of the week.   The patient is currently on Lovenox for her history of lower extremity recurrent DVTs while on anticoagulation.  The patient denies any new fevers or chills.  She denies any breathing changes and denies any dyspnea on exertion, cough, chest pain, or hemoptysis.  She denies any nausea, vomiting, diarrhea, or constipation.  Denies any headache or visual changes. The patient is here today for evaluation and repeat blood work before starting cycle #18.   MEDICAL HISTORY: Past Medical History:  Diagnosis Date   Borderline diabetes mellitus    Bronchogenic cancer of right lung ([facility])    Dyslipidemia    History of radiation therapy    right chest 12/21/2020-12/24/2020  Dr Gery Pray  History of radiation therapy    right lung 06/07/2021-06/17/2021  Dr Gery Pray   Hypertension    Polymyalgia rheumatica ([facility])     ALLERGIES:  is allergic to shellfish-derived products and augmentin [amoxicillin-pot clavulanate].  MEDICATIONS:  Current Outpatient Medications  Medication Sig Dispense Refill   acetaminophen (TYLENOL) 650 MG CR tablet Take 1,300 mg by mouth 3 (three) times daily as needed for pain.     aspirin EC 81 MG tablet Take 81 mg by mouth 4 (four) times a week. Swallow whole. (Patient not taking:  Reported on 11/09/2021)     atenolol (TENORMIN) 25 MG tablet Take 12.5 mg by mouth at bedtime.     Calcium-Vitamin D-Vitamin K (CALCIUM + D) [phone] MG-UNT-MCG CHEW      Cholecalciferol (VITAMIN D) 50 MCG (2000 UT) tablet      diclofenac Sodium (VOLTAREN) 1 % GEL Apply 4 g topically 4 (four) times daily. (Patient taking differently: Apply 4 g topically 4 (four) times daily as needed (pain).) 150 g 0   dronabinol (MARINOL) 5 MG capsule Take 1 capsule (5 mg total) by mouth 2 (two) times daily before a meal. 60 capsule 1   enoxaparin (LOVENOX) 60 MG/0.6ML injection Inject 0.6 mLs (60 mg total) into the skin daily. 18 mL 2   escitalopram (LEXAPRO) 20 MG tablet Take 1 tablet (20 mg total) by mouth at bedtime. 30 tablet 1   fexofenadine (ALLEGRA) 180 MG tablet Take 180 mg by mouth daily.     fluocinonide (LIDEX) 0.05 % external solution Apply topically daily.     folic acid (FOLVITE) 1 MG tablet TAKE ONE TABLET BY MOUTH ONE TIME DAILY 30 tablet 2   lidocaine (LIDODERM) 5 % Place 1 patch onto the skin daily. Remove & Discard patch within 12 hours or as directed by MD (Patient not taking: Reported on 11/09/2021) 30 patch 0   mirtazapine (REMERON) 15 MG tablet Take 15 mg by mouth at bedtime.     morphine (MS CONTIN) 30 MG 12 hr tablet Take 1 tablet (30 mg total) by mouth every 12 (twelve) hours. 60 tablet 0   Multiple Vitamin (MULTI VITAMIN) TABS      ondansetron (ZOFRAN-ODT) 4 MG disintegrating tablet Take 1 tablet (4 mg total) by mouth 2 (two) times daily as needed for nausea or vomiting. (Patient not taking: Reported on 11/30/2021) 20 tablet 1   predniSONE (DELTASONE) 1 MG tablet Take 3 mg by mouth daily.     predniSONE (DELTASONE) 5 MG tablet Take 5 mg by mouth daily.     Salicylic Acid (SCALPICIN EX) Apply 1 application. topically daily as needed.     senna-docusate (SENOKOT-S) 8.6-50 MG tablet Take 1 tablet by mouth 2 (two) times daily. 30 tablet 2   triamcinolone ointment (KENALOG) 0.1 % Apply 1  application. topically 2 (two) times daily.     No current facility-administered medications for this visit.    SURGICAL HISTORY: No past surgical history on file.  REVIEW OF SYSTEMS:   Constitutional: Positive for fatigue. Negative for appetite change, chills, fever and unexpected weight change.  HENT:   Negative for mouth sores, nosebleeds, sore throat and trouble swallowing.   Eyes: Negative for eye problems and icterus.  Respiratory: Negative for cough, hemoptysis, shortness of breath and wheezing.   Cardiovascular: Negative for chest pain and leg swelling.  Gastrointestinal: Negative for abdominal pain, constipation, diarrhea, nausea and vomiting.  Genitourinary: Negative for bladder incontinence, difficulty urinating, dysuria, frequency and hematuria.   Musculoskeletal:  Positive for mid-back pain. Negative for gait problem, neck pain, and neck stiffness.  Skin: Negative for itching and rash.  Neurological: Negative for dizziness, extremity weakness, gait problem, headaches, light-headedness and seizures.  Hematological: Negative for adenopathy. Does not bruise/bleed easily.  Psychiatric/Behavioral: Positive for depression. Negative for confusion and sleep disturbance. The patient is not nervous/anxious.     PHYSICAL EXAMINATION:  Blood pressure (!) 123/59, pulse 60, temperature 98.1 F (36.7 C), resp. rate 18, weight 91 lb 8 oz (41.5 kg), SpO2 96 %.  ECOG PERFORMANCE STATUS: 1  Physical Exam  Constitutional: Oriented to person, place, and time and cachetic appearing female, and in no distress.  HENT:  Head: Normocephalic and atraumatic.  Mouth/Throat: Oropharynx is clear and moist. No oropharyngeal exudate.  Eyes: Conjunctivae are normal. Right eye exhibits no discharge. Left eye exhibits no discharge. No scleral icterus.  Neck: Normal range of motion. Neck supple.  Cardiovascular: Normal rate, regular rhythm, normal heart sounds and intact distal pulses.   Pulmonary/Chest:  Effort normal. Some expiratory wheezing noted bilaterally. No respiratory distress. No rales.  Abdominal: Soft. Bowel sounds are normal. Exhibits no distension and no mass. There is no tenderness.  Musculoskeletal: Normal range of motion. No edema. Rudding appearance of feet.  Lymphadenopathy: No cervical adenopathy.  Neurological: Alert and oriented to person, place, and time. Exhibits muscle wasting. Coordination normal.  Skin: Skin is warm and dry. No rash noted. Not diaphoretic. No erythema. No pallor.  Psychiatric: Mood, memory and judgment normal.  Vitals reviewed.  LABORATORY DATA: Lab Results  Component Value Date   WBC 5.4 12/20/2021   HGB 11.0 (L) 12/20/2021   HCT 33.8 (L) 12/20/2021   MCV 100.3 (H) 12/20/2021   PLT 222 12/20/2021      Chemistry      Component Value Date/Time   NA 139 12/20/2021 1121   K 3.8 12/20/2021 1121   CL 102 12/20/2021 1121   CO2 31 12/20/2021 1121   BUN 11 12/20/2021 1121   CREATININE 0.64 12/20/2021 1121      Component Value Date/Time   CALCIUM 8.9 12/20/2021 1121   ALKPHOS 60 12/20/2021 1121   AST 16 12/20/2021 1121   ALT 7 12/20/2021 1121   BILITOT 0.3 12/20/2021 1121       RADIOGRAPHIC STUDIES:  No results found.   ASSESSMENT/PLAN:  This is a very pleasant 76 year old Caucasian female diagnosed with stage IV (T3, N2, M1 C) non-small cell lung cancer with unknown histologic subtype questionably likely to be squamous cell carcinoma based on the morphology of the right upper lobe cavitary mass with mediastinal lymphadenopathy metastatic disease to the bones as well as the right adrenal gland.  She was diagnosed in June 2022.  Adenocarcinoma cannot be completely ruled out especially with the K-ras G12C mutation.  Since the patient has an actionable mutation with K-ras G12C, this can be used as an option in the second line setting.    The patient completed palliative radiotherapy to multiple metastatic bone lesions under the care of  Dr. Sondra Come.  The patient is currently undergoing systemic chemotherapy with Alimta 700 mg per metered squared IV every 3 weeks.  She is status post 17 cycles.  We have discontinued Keytruda starting from cycle #7 due to her significant myalgias in the event this is immunotherapy related.  She is presently on prednisone for polymyalgia rheumatica. Her pain has significantly improved at this time.     The patient completed additional radiation under the care of Dr. Sondra Come  on June 17, 2021 to the enlarging primary lung mass.    Labs were reviewed.  Recommend that she proceed with cycle #18 today as scheduled with Alimta.  I will arrange for restaging CT scan the chest, abdomen, pelvis prior to starting her next cycle of treatment.  She will continue to receive Zometa every 12 weeks for her bone disease.  We recently had received dental clearance.  I have reentered the orders and reviewed with Dr. Julien Nordmann.  She will continue taking Marinol for her decreased appetite.   She will continue using Lovenox for history of DVT.  The patient is frustrated with needing to take Lovenox.  We rediscussed the rationale behind this again today.  I discussed with the patient and her husband that we would recommend that she continue with routine health maintenance with her PCP, such as her DEXA scans, if she is due.  The patient has several risk factors for osteoporosis including her prolonged prednisone use, postmenopausal status, smoking history, bone disease, and body size.  I have reached out to the palliative care team regarding a refill request for her morphine later this week as she is going to run out over the weekend.  The patient was advised to call immediately if she has any concerning symptoms in the interval. The patient voices understanding of current disease status and treatment options and is in agreement with the current care plan. All questions were answered. The patient knows to call the  clinic with any problems, questions or concerns. We can certainly see the patient much sooner if necessary              Orders Placed This Encounter  Procedures   CT Chest W Contrast    Standing Status:   Future    Standing Expiration Date:   12/20/2022    Order Specific Question:   If indicated for the ordered procedure, I authorize the administration of contrast media per Radiology protocol    Answer:   Yes    Order Specific Question:   Preferred imaging location?    Answer:   [facility]   CT Abdomen Pelvis W Contrast    Standing Status:   Future    Standing Expiration Date:   12/20/2022    Order Specific Question:   If indicated for the ordered procedure, I authorize the administration of contrast media per Radiology protocol    Answer:   Yes    Order Specific Question:   Preferred imaging location?    Answer:   [facility]    Order Specific Question:   Is Oral Contrast requested for this exam?    Answer:   Yes, Per Radiology protocol    The total time spent in the appointment was 20-29 minutes.   Helon Wisinski L Sunil Hue, PA-C 12/20/21

## 2021-12-19 DIAGNOSIS — C3490 Malignant neoplasm of unspecified part of unspecified bronchus or lung: Secondary | ICD-10-CM | POA: Diagnosis not present

## 2021-12-20 ENCOUNTER — Other Ambulatory Visit: Payer: Self-pay | Admitting: Physician Assistant

## 2021-12-20 ENCOUNTER — Other Ambulatory Visit: Payer: Self-pay

## 2021-12-20 ENCOUNTER — Inpatient Hospital Stay: Payer: Medicare HMO | Admitting: Physician Assistant

## 2021-12-20 ENCOUNTER — Inpatient Hospital Stay: Payer: Medicare HMO | Attending: Internal Medicine

## 2021-12-20 ENCOUNTER — Inpatient Hospital Stay: Payer: Medicare HMO

## 2021-12-20 ENCOUNTER — Other Ambulatory Visit: Payer: Self-pay | Admitting: Nurse Practitioner

## 2021-12-20 VITALS — BP 123/59 | HR 60 | Temp 98.1°F | Resp 18 | Wt 91.5 lb

## 2021-12-20 VITALS — BP 96/86 | HR 65 | Temp 98.0°F | Resp 16

## 2021-12-20 DIAGNOSIS — C3491 Malignant neoplasm of unspecified part of right bronchus or lung: Secondary | ICD-10-CM | POA: Diagnosis not present

## 2021-12-20 DIAGNOSIS — Z5111 Encounter for antineoplastic chemotherapy: Secondary | ICD-10-CM | POA: Diagnosis not present

## 2021-12-20 DIAGNOSIS — S22080A Wedge compression fracture of T11-T12 vertebra, initial encounter for closed fracture: Secondary | ICD-10-CM

## 2021-12-20 DIAGNOSIS — I10 Essential (primary) hypertension: Secondary | ICD-10-CM | POA: Insufficient documentation

## 2021-12-20 DIAGNOSIS — C7971 Secondary malignant neoplasm of right adrenal gland: Secondary | ICD-10-CM | POA: Insufficient documentation

## 2021-12-20 DIAGNOSIS — R748 Abnormal levels of other serum enzymes: Secondary | ICD-10-CM

## 2021-12-20 DIAGNOSIS — C3411 Malignant neoplasm of upper lobe, right bronchus or lung: Secondary | ICD-10-CM | POA: Insufficient documentation

## 2021-12-20 DIAGNOSIS — R001 Bradycardia, unspecified: Secondary | ICD-10-CM | POA: Diagnosis not present

## 2021-12-20 DIAGNOSIS — C7951 Secondary malignant neoplasm of bone: Secondary | ICD-10-CM | POA: Diagnosis not present

## 2021-12-20 LAB — CMP (CANCER CENTER ONLY)
ALT: 7 U/L (ref 0–44)
AST: 16 U/L (ref 15–41)
Albumin: 3.8 g/dL (ref 3.5–5.0)
Alkaline Phosphatase: 60 U/L (ref 38–126)
Anion gap: 6 (ref 5–15)
BUN: 11 mg/dL (ref 8–23)
CO2: 31 mmol/L (ref 22–32)
Calcium: 8.9 mg/dL (ref 8.9–10.3)
Chloride: 102 mmol/L (ref 98–111)
Creatinine: 0.64 mg/dL (ref 0.44–1.00)
GFR, Estimated: >60 mL/min (ref >60)
Glucose, Bld: 78 mg/dL (ref 70–99)
Potassium: 3.8 mmol/L (ref 3.5–5.1)
Sodium: 139 mmol/L (ref 135–145)
Total Bilirubin: 0.3 mg/dL (ref 0.3–1.2)
Total Protein: 6.9 g/dL (ref 6.5–8.1)

## 2021-12-20 LAB — CBC WITH DIFFERENTIAL (CANCER CENTER ONLY)
Abs Immature Granulocytes: 0.02 10*3/uL (ref 0.00–0.07)
Basophils Absolute: 0 10*3/uL (ref 0.0–0.1)
Basophils Relative: 1 %
Eosinophils Absolute: 0.2 10*3/uL (ref 0.0–0.5)
Eosinophils Relative: 4 %
HCT: 33.8 % — ABNORMAL LOW (ref 36.0–46.0)
Hemoglobin: 11 g/dL — ABNORMAL LOW (ref 12.0–15.0)
Immature Granulocytes: 0 %
Lymphocytes Relative: 20 %
Lymphs Abs: 1.1 10*3/uL (ref 0.7–4.0)
MCH: 32.6 pg (ref 26.0–34.0)
MCHC: 32.5 g/dL (ref 30.0–36.0)
MCV: 100.3 fL — ABNORMAL HIGH (ref 80.0–100.0)
Monocytes Absolute: 0.8 10*3/uL (ref 0.1–1.0)
Monocytes Relative: 14 %
Neutro Abs: 3.3 10*3/uL (ref 1.7–7.7)
Neutrophils Relative %: 61 %
Platelet Count: 222 10*3/uL (ref 150–400)
RBC: 3.37 MIL/uL — ABNORMAL LOW (ref 3.87–5.11)
RDW: 14.5 % (ref 11.5–15.5)
WBC Count: 5.4 10*3/uL (ref 4.0–10.5)
nRBC: 0 % (ref 0.0–0.2)

## 2021-12-20 LAB — VITAMIN B12: Vitamin B-12: 1063 pg/mL — ABNORMAL HIGH (ref 180–914)

## 2021-12-20 MED ORDER — SODIUM CHLORIDE 0.9 % IV SOLN
500.0000 mg/m2 | Freq: Once | INTRAVENOUS | Status: AC
  Administered 2021-12-20: 700 mg via INTRAVENOUS
  Filled 2021-12-20: qty 20

## 2021-12-20 MED ORDER — ZOLEDRONIC ACID 4 MG/5ML IV CONC
3.5000 mg | Freq: Once | INTRAVENOUS | Status: AC
  Administered 2021-12-20: 3.5 mg via INTRAVENOUS
  Filled 2021-12-20: qty 4.38

## 2021-12-20 MED ORDER — DRONABINOL 5 MG PO CAPS: 5.0000 mg | ORAL_CAPSULE | Freq: Two times a day (BID) | ORAL | 1 refills | Status: DC

## 2021-12-20 MED ORDER — SODIUM CHLORIDE 0.9 % IV SOLN: Freq: Once | INTRAVENOUS | Status: AC

## 2021-12-20 MED ORDER — MORPHINE SULFATE ER 30 MG PO TBCR: 30.0000 mg | EXTENDED_RELEASE_TABLET | Freq: Two times a day (BID) | ORAL | 0 refills | Status: DC

## 2021-12-20 NOTE — Patient Instructions (Signed)
[facility] ONCOLOGY  Discharge Instructions: Thank you for choosing [facility] to provide your oncology and hematology care.   If you have a lab appointment with the [facility], please go directly to the [facility] and check in at the registration area.   Wear comfortable clothing and clothing appropriate for easy access to any Portacath or PICC line.   We strive to give you quality time with your provider. You may need to reschedule your appointment if you arrive late (15 or more minutes).  Arriving late affects you and other patients whose appointments are after yours.  Also, if you miss three or more appointments without notifying the office, you may be dismissed from the clinic at the provider's discretion.      For prescription refill requests, have your pharmacy contact our office and allow 72 hours for refills to be completed.    Today you received the following chemotherapy and/or immunotherapy agents: Alimta      To help prevent nausea and vomiting after your treatment, we encourage you to take your nausea medication as directed.  BELOW ARE SYMPTOMS THAT SHOULD BE REPORTED IMMEDIATELY: *FEVER GREATER THAN 100.4 F (38 C) OR HIGHER *CHILLS OR SWEATING *NAUSEA AND VOMITING THAT IS NOT CONTROLLED WITH YOUR NAUSEA MEDICATION *UNUSUAL SHORTNESS OF BREATH *UNUSUAL BRUISING OR BLEEDING *URINARY PROBLEMS (pain or burning when urinating, or frequent urination) *BOWEL PROBLEMS (unusual diarrhea, constipation, pain near the anus) TENDERNESS IN MOUTH AND THROAT WITH OR WITHOUT PRESENCE OF ULCERS (sore throat, sores in mouth, or a toothache) UNUSUAL RASH, SWELLING OR PAIN  UNUSUAL VAGINAL DISCHARGE OR ITCHING   Items with * indicate a potential emergency and should be followed up as soon as possible or go to the Emergency Department if any problems should occur.  Please show the CHEMOTHERAPY ALERT CARD or IMMUNOTHERAPY ALERT CARD at check-in to the  Emergency Department and triage nurse.  Should you have questions after your visit or need to cancel or reschedule your appointment, please contact [facility]  Dept: [phone]  and follow the prompts.  Office hours are 8:00 a.m. to 4:30 p.m. Monday - Friday. Please note that voicemails left after 4:00 p.m. may not be returned until the following business day.  We are closed weekends and major holidays. You have access to a nurse at all times for urgent questions. Please call the main number to the clinic Dept: [phone] and follow the prompts.   For any non-urgent questions, you may also contact your provider using MyChart. We now offer e-Visits for anyone 75 and older to request care online for non-urgent symptoms. For details visit [email or web address].   Also download the MyChart app! Go to the app store, search "MyChart", open the app, select [facility], and log in with your MyChart username and password.  Masks are optional in the cancer centers. If you would like for your care team to wear a mask while they are taking care of you, please let them know. You may have one support person who is at least 75 years old accompany you for your appointments.  Zoledronic Acid Injection (Cancer) What is this medication? ZOLEDRONIC ACID (ZOE le dron ik AS id) treats high calcium levels in the blood caused by cancer. It may also be used with chemotherapy to treat weakened bones caused by cancer. It works by slowing down the release of calcium from bones. This lowers calcium levels in your blood. It also makes  your bones stronger and less likely to break (fracture). It belongs to a group of medications called bisphosphonates. This medicine may be used for other purposes; ask your health care provider or pharmacist if you have questions. COMMON BRAND NAME(S): Zometa, Zometa Powder What should I tell my care team before I take this medication? They need to know if you  have any of these conditions: Dehydration Dental disease Kidney disease Liver disease Low levels of calcium in the blood Lung or breathing disease, such as asthma Receiving steroids, such as dexamethasone or prednisone An unusual or allergic reaction to zoledronic acid, other medications, foods, dyes, or preservatives Pregnant or trying to get pregnant Breast-feeding How should I use this medication? This medication is injected into a vein. It is given by your care team in a hospital or clinic setting. Talk to your care team about the use of this medication in children. Special care may be needed. Overdosage: If you think you have taken too much of this medicine contact a poison control center or emergency room at once. NOTE: This medicine is only for you. Do not share this medicine with others. What if I miss a dose? Keep appointments for follow-up doses. It is important not to miss your dose. Call your care team if you are unable to keep an appointment. What may interact with this medication? Certain antibiotics given by injection Diuretics, such as bumetanide, furosemide NSAIDs, medications for pain and inflammation, such as ibuprofen or naproxen Teriparatide Thalidomide This list may not describe all possible interactions. Give your health care provider a list of all the medicines, herbs, non-prescription drugs, or dietary supplements you use. Also tell them if you smoke, drink alcohol, or use illegal drugs. Some items may interact with your medicine. What should I watch for while using this medication? Visit your care team for regular checks on your progress. It may be some time before you see the benefit from this medication. Some people who take this medication have severe bone, joint, or muscle pain. This medication may also increase your risk for jaw problems or a broken thigh bone. Tell your care team right away if you have severe pain in your jaw, bones, joints, or muscles. Tell  you care team if you have any pain that does not go away or that gets worse. Tell your dentist and dental surgeon that you are taking this medication. You should not have major dental surgery while on this medication. See your dentist to have a dental exam and fix any dental problems before starting this medication. Take good care of your teeth while on this medication. Make sure you see your dentist for regular follow-up appointments. You should make sure you get enough calcium and vitamin D while you are taking this medication. Discuss the foods you eat and the vitamins you take with your care team. Check with your care team if you have severe diarrhea, nausea, and vomiting, or if you sweat a lot. The loss of too much body fluid may make it dangerous for you to take this medication. You may need bloodwork while taking this medication. Talk to your care team if you wish to become pregnant or think you might be pregnant. This medication can cause serious birth defects. What side effects may I notice from receiving this medication? Side effects that you should report to your care team as soon as possible: Allergic reactions--skin rash, itching, hives, swelling of the face, lips, tongue, or throat Kidney injury--decrease in the amount  of urine, swelling of the ankles, hands, or feet Low calcium level--muscle pain or cramps, confusion, tingling, or numbness in the hands or feet Osteonecrosis of the jaw--pain, swelling, or redness in the mouth, numbness of the jaw, poor healing after dental work, unusual discharge from the mouth, visible bones in the mouth Severe bone, joint, or muscle pain Side effects that usually do not require medical attention (report to your care team if they continue or are bothersome): Constipation Fatigue Fever Loss of appetite Nausea Stomach pain This list may not describe all possible side effects. Call your doctor for medical advice about side effects. You may report side  effects to FDA at 1-800-FDA-1088. Where should I keep my medication? This medication is given in a hospital or clinic. It will not be stored at home. NOTE: This sheet is a summary. It may not cover all possible information. If you have questions about this medicine, talk to your doctor, pharmacist, or health care provider.  2023 Elsevier/Gold Standard (2021-07-01 00:00:00)

## 2022-01-06 DIAGNOSIS — C3491 Malignant neoplasm of unspecified part of right bronchus or lung: Secondary | ICD-10-CM | POA: Diagnosis not present

## 2022-01-07 ENCOUNTER — Ambulatory Visit (HOSPITAL_COMMUNITY)
Admission: RE | Admit: 2022-01-07 | Discharge: 2022-01-07 | Disposition: A | Discharge status: Home / Self Care | Payer: Medicare HMO | Source: Ambulatory Visit | Attending: Physician Assistant | Admitting: Physician Assistant

## 2022-01-07 DIAGNOSIS — C787 Secondary malignant neoplasm of liver and intrahepatic bile duct: Secondary | ICD-10-CM | POA: Diagnosis not present

## 2022-01-07 DIAGNOSIS — C3491 Malignant neoplasm of unspecified part of right bronchus or lung: Secondary | ICD-10-CM | POA: Insufficient documentation

## 2022-01-07 DIAGNOSIS — J432 Centrilobular emphysema: Secondary | ICD-10-CM | POA: Diagnosis not present

## 2022-01-07 DIAGNOSIS — J90 Pleural effusion, not elsewhere classified: Secondary | ICD-10-CM | POA: Diagnosis not present

## 2022-01-07 DIAGNOSIS — K5730 Diverticulosis of large intestine without perforation or abscess without bleeding: Secondary | ICD-10-CM | POA: Diagnosis not present

## 2022-01-07 DIAGNOSIS — N289 Disorder of kidney and ureter, unspecified: Secondary | ICD-10-CM | POA: Diagnosis not present

## 2022-01-07 DIAGNOSIS — R918 Other nonspecific abnormal finding of lung field: Secondary | ICD-10-CM | POA: Diagnosis not present

## 2022-01-07 MED ORDER — IOHEXOL 300 MG/ML  SOLN
75.0000 mL | Freq: Once | INTRAMUSCULAR | Status: AC | PRN
  Administered 2022-01-07: 75 mL via INTRAVENOUS

## 2022-01-07 MED ORDER — SODIUM CHLORIDE (PF) 0.9 % IJ SOLN
INTRAMUSCULAR | Status: AC
  Filled 2022-01-07: qty 50

## 2022-01-10 ENCOUNTER — Other Ambulatory Visit: Payer: Self-pay

## 2022-01-10 ENCOUNTER — Encounter: Payer: Self-pay | Admitting: Nurse Practitioner

## 2022-01-10 ENCOUNTER — Inpatient Hospital Stay: Payer: Medicare HMO | Admitting: Internal Medicine

## 2022-01-10 ENCOUNTER — Telehealth: Payer: Self-pay | Admitting: Pharmacist

## 2022-01-10 ENCOUNTER — Telehealth: Payer: Self-pay | Admitting: Pharmacy Technician

## 2022-01-10 ENCOUNTER — Other Ambulatory Visit (HOSPITAL_COMMUNITY): Payer: Self-pay

## 2022-01-10 ENCOUNTER — Inpatient Hospital Stay (HOSPITAL_BASED_OUTPATIENT_CLINIC_OR_DEPARTMENT_OTHER): Payer: Medicare HMO | Admitting: Nurse Practitioner

## 2022-01-10 ENCOUNTER — Encounter: Payer: Self-pay | Admitting: Internal Medicine

## 2022-01-10 ENCOUNTER — Inpatient Hospital Stay: Payer: Medicare HMO

## 2022-01-10 VITALS — BP 113/60 | HR 65 | Temp 98.4°F | Resp 17 | Wt 89.2 lb

## 2022-01-10 DIAGNOSIS — R001 Bradycardia, unspecified: Secondary | ICD-10-CM | POA: Diagnosis not present

## 2022-01-10 DIAGNOSIS — C7971 Secondary malignant neoplasm of right adrenal gland: Secondary | ICD-10-CM | POA: Diagnosis not present

## 2022-01-10 DIAGNOSIS — R630 Anorexia: Secondary | ICD-10-CM | POA: Diagnosis not present

## 2022-01-10 DIAGNOSIS — R634 Abnormal weight loss: Secondary | ICD-10-CM

## 2022-01-10 DIAGNOSIS — R530 Neoplastic (malignant) related fatigue: Secondary | ICD-10-CM

## 2022-01-10 DIAGNOSIS — C7951 Secondary malignant neoplasm of bone: Secondary | ICD-10-CM | POA: Diagnosis not present

## 2022-01-10 DIAGNOSIS — Z515 Encounter for palliative care: Secondary | ICD-10-CM

## 2022-01-10 DIAGNOSIS — I10 Essential (primary) hypertension: Secondary | ICD-10-CM | POA: Diagnosis not present

## 2022-01-10 DIAGNOSIS — C3491 Malignant neoplasm of unspecified part of right bronchus or lung: Secondary | ICD-10-CM

## 2022-01-10 DIAGNOSIS — G893 Neoplasm related pain (acute) (chronic): Secondary | ICD-10-CM

## 2022-01-10 DIAGNOSIS — Z5111 Encounter for antineoplastic chemotherapy: Secondary | ICD-10-CM | POA: Diagnosis not present

## 2022-01-10 DIAGNOSIS — C3411 Malignant neoplasm of upper lobe, right bronchus or lung: Secondary | ICD-10-CM | POA: Diagnosis not present

## 2022-01-10 LAB — CMP (CANCER CENTER ONLY)
ALT: 11 U/L (ref 0–44)
AST: 21 U/L (ref 15–41)
Albumin: 3.9 g/dL (ref 3.5–5.0)
Alkaline Phosphatase: 70 U/L (ref 38–126)
Anion gap: 5 (ref 5–15)
BUN: 12 mg/dL (ref 8–23)
CO2: 32 mmol/L (ref 22–32)
Calcium: 9.3 mg/dL (ref 8.9–10.3)
Chloride: 101 mmol/L (ref 98–111)
Creatinine: 0.68 mg/dL (ref 0.44–1.00)
GFR, Estimated: >60 mL/min (ref >60)
Glucose, Bld: 91 mg/dL (ref 70–99)
Potassium: 4 mmol/L (ref 3.5–5.1)
Sodium: 138 mmol/L (ref 135–145)
Total Bilirubin: 0.4 mg/dL (ref 0.3–1.2)
Total Protein: 6.8 g/dL (ref 6.5–8.1)

## 2022-01-10 LAB — CBC WITH DIFFERENTIAL (CANCER CENTER ONLY)
Abs Immature Granulocytes: 0.03 10*3/uL (ref 0.00–0.07)
Basophils Absolute: 0 10*3/uL (ref 0.0–0.1)
Basophils Relative: 1 %
Eosinophils Absolute: 0.2 10*3/uL (ref 0.0–0.5)
Eosinophils Relative: 3 %
HCT: 33 % — ABNORMAL LOW (ref 36.0–46.0)
Hemoglobin: 11 g/dL — ABNORMAL LOW (ref 12.0–15.0)
Immature Granulocytes: 0 %
Lymphocytes Relative: 8 %
Lymphs Abs: 0.6 10*3/uL — ABNORMAL LOW (ref 0.7–4.0)
MCH: 32 pg (ref 26.0–34.0)
MCHC: 33.3 g/dL (ref 30.0–36.0)
MCV: 95.9 fL (ref 80.0–100.0)
Monocytes Absolute: 0.7 10*3/uL (ref 0.1–1.0)
Monocytes Relative: 9 %
Neutro Abs: 6 10*3/uL (ref 1.7–7.7)
Neutrophils Relative %: 79 %
Platelet Count: 251 10*3/uL (ref 150–400)
RBC: 3.44 MIL/uL — ABNORMAL LOW (ref 3.87–5.11)
RDW: 14.6 % (ref 11.5–15.5)
WBC Count: 7.6 10*3/uL (ref 4.0–10.5)
nRBC: 0 % (ref 0.0–0.2)

## 2022-01-10 MED ORDER — ADAGRASIB 200 MG PO TABS
600.0000 mg | ORAL_TABLET | Freq: Two times a day (BID) | ORAL | 3 refills | Status: DC
Start: 2022-01-10 — End: 2022-01-11
  Filled 2022-01-10: qty 180, 30d supply, fill #0

## 2022-01-10 NOTE — Telephone Encounter (Signed)
Oral Oncology Patient Advocate Encounter   Began application for assistance for Krazati through [employer].   Application will be submitted upon completion of necessary supporting documentation.   [employer] phone number [phone].   I will continue to check the status until final determination.   Lady Deutscher, CPhT-Adv Pharmacy Patient Advocate Specialist [facility] Patient Advocate Team Direct Number: [phone]  Fax: [phone]

## 2022-01-10 NOTE — Telephone Encounter (Signed)
Oral Oncology Patient Advocate Encounter  Prior Authorization for Alfred Levins has been approved.    PA# [id number] Effective dates: 01/10/2022 through 05/21/2022  Patients co-pay is $3,398.59.    Lady Deutscher, CPhT-Adv Pharmacy Patient Advocate Specialist [facility] Patient Advocate Team Direct Number: [phone]  Fax: [phone]

## 2022-01-10 NOTE — Telephone Encounter (Signed)
Oral Chemotherapy Pharmacist Encounter   I met with patient and patient's husband in clinic for overview of new oral chemotherapy medication: Kristi Jennings (adagrasib) for the treatment of metastatic non-small cell lung cancer, KRAS G12C-mutated, planned duration until disease progression or unacceptable drug toxicity.   Pt is doing well. Counseled patient on administration, dosing, side effects, monitoring, drug-food interactions, safe handling, storage, and disposal.   Patient will take Krazati 200 mg tablets, 3 tablets (600 mg total) by mouth 2 (two) times daily. Recommended patient take Kristi Jennings with food to decrease GI upset.  CBC w/ Diff and CMP from 01/10/22 assessed, no baseline dose adjustments required. Patient will have baseline EKG obtained in office on 01/10/22 (QtcB 453 ms)   Start date: pending PA approval and medication acquisition - will update encounter with exact start date.   Side effects include but are not limited to: diarrhea, nausea/vomiting, hepatotoxicity, edema, fatigue, decrease in blood counts. Also reviewed rare but serious side effect of Qtc prolongation as well as pneumonitis that have been reported with use of medication.  Patient has anti-emetic on hand and knows to take it if nausea develops.   Patient will obtain anti diarrheal and alert the office of 4 or more loose stools above baseline.    Reviewed with patient importance of keeping a medication schedule and plan for any missed doses.  Current medication list in Epic reviewed, DDIs with Nadara Mode identified: Drug-drug interaction between [place] and Lexapro due to risk of Qtc prolongation. Baseline EKG to be obtained in clinic 01/10/22 - patient encouraged to discuss possible alternative therapy with PCP for depression/anxiety management without as high of Qtc prolongation risk.  Drug-drug interaction between [place] and Ondansetron - Qtc prolongation risk higher with IV ondansetron. Noted patient only taking PRN. No  change in therapy warranted at this time.   Drug-drug interaction between [place] as well as Krazati and Dronabinol - Krazati a strong CYP3A4 substrate may increase serum concentrations of both mirtazepine and dronabinol. Patient advised to monitor for increased sedation with concomitant use of these agents. No change in therapy required at this time.    After discussion with patient no patient barriers to medication adherence identified.    All questions answered. Patient agreement for treatment documented in MD note on 01/10/22.  [name] voiced understanding and appreciation.   Patient knows to call the office with questions or concerns. Oral Chemotherapy Clinic phone number provided to patient.  Leron Croak, PharmD, BCPS, BCOP Hematology/Oncology Clinical Pharmacist [facility] [place] [phone] 01/10/2022 3:09 PM

## 2022-01-10 NOTE — Progress Notes (Signed)
[place] [phone]   Fax:([phone]) [phone]  OFFICE PROGRESS NOTE  [name], NP [address] [place] [address]  DIAGNOSIS: Stage IV (T3, N2, M1 C) non-small cell lung cancer with unknown histologic subtype.  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   DETECTED ALTERATION(S) / BIOMARKER(S)      % CFDNA OR AMPLIFICATION        ASSOCIATED FDA-APPROVED THERAPIES         CLINICAL TRIAL AVAILABILITY [id number] 8.9%   Sotorasib Yes [id number] 7.2% None     Yes     PRIOR THERAPY:   1) Palliative radiotherapy to the painful bone lesions under the care of Dr. Sondra Come.  2) Palliative systemic chemotherapy with carboplatin for AUC of 5, Alimta 500 Mg/M2 and Keytruda 200 Mg IV every 3 weeks.  First dose of treatment on December 29, 2020. Status post 18  cycles.  Starting from cycle #5, the patient is going to start maintenance Alimta and Keytruda. Keytruda discontinued from cycle #7 due to myalgias.  Last cycle was given on December 20, 2021 discontinued secondary to disease progression. 3) Possible palliative radiation to the enlarging pulmonary mass under the care of Dr. Sondra Come.   CURRENT THERAPY: 1) Krazati (Adagrasib) 600 Mg p.o. twice daily.  First dose expected in the next few days 2) Zometa every 6 weeks starting on 02/10/21   INTERVAL HISTORY: Kristi Jennings 76 y.o. female returns to the clinic today for follow-up visit accompanied by her husband.  The patient continues to complain of increasing fatigue and weakness as well as low back pain and lack of appetite.  She takes Dilaudid for breakthrough pain management.  She denied having any current chest pain, shortness of breath, cough or hemoptysis.  She has no nausea, vomiting, diarrhea or constipation.  She has no headache or  visual changes.  She lost few pounds since her last visit.  She tolerated the last cycle of her treatment with maintenance Alimta fairly well.  The patient had repeat CT scan of the chest, abdomen and pelvis performed recently and she is here for evaluation and discussion of her scan results and treatment options.    MEDICAL HISTORY: Past Medical History:  Diagnosis Date   Borderline diabetes mellitus    Bronchogenic cancer of right lung ([place])    Dyslipidemia    History of radiation therapy    right chest 12/21/2020-12/24/2020  Dr Gery Pray   History of radiation therapy    right lung 06/07/2021-06/17/2021  Dr Gery Pray   Hypertension    Polymyalgia rheumatica ([facility])     ALLERGIES:  is allergic to shellfish-derived products and augmentin [amoxicillin-pot clavulanate].  MEDICATIONS:  Current Outpatient Medications  Medication Sig Dispense Refill   acetaminophen (TYLENOL) 650 MG CR tablet Take 1,300 mg by mouth 3 (three) times daily as needed for pain.     aspirin EC 81 MG tablet Take 81 mg by mouth 4 (four) times a week. Swallow whole. (Patient not taking: Reported on 11/09/2021)     atenolol (TENORMIN) 25 MG tablet Take 12.5 mg by mouth at bedtime.     Calcium-Vitamin D-Vitamin K (CALCIUM + D) [phone] MG-UNT-MCG CHEW      Cholecalciferol (VITAMIN D) 50 MCG (2000 UT) tablet  diclofenac Sodium (VOLTAREN) 1 % GEL Apply 4 g topically 4 (four) times daily. (Patient taking differently: Apply 4 g topically 4 (four) times daily as needed (pain).) 150 g 0   dronabinol (MARINOL) 5 MG capsule Take 1 capsule (5 mg total) by mouth 2 (two) times daily before a meal. 60 capsule 1   enoxaparin (LOVENOX) 60 MG/0.6ML injection Inject 0.6 mLs (60 mg total) into the skin daily. 18 mL 2   escitalopram (LEXAPRO) 20 MG tablet Take 1 tablet (20 mg total) by mouth at bedtime. 30 tablet 1   fexofenadine (ALLEGRA) 180 MG tablet Take 180 mg by mouth daily.     fluocinonide (LIDEX) 0.05 % external solution  Apply topically daily.     folic acid (FOLVITE) 1 MG tablet TAKE ONE TABLET BY MOUTH ONE TIME DAILY 30 tablet 2   lidocaine (LIDODERM) 5 % Place 1 patch onto the skin daily. Remove & Discard patch within 12 hours or as directed by MD (Patient not taking: Reported on 11/09/2021) 30 patch 0   mirtazapine (REMERON) 15 MG tablet Take 15 mg by mouth at bedtime.     morphine (MS CONTIN) 30 MG 12 hr tablet Take 1 tablet (30 mg total) by mouth every 12 (twelve) hours. 60 tablet 0   Multiple Vitamin (MULTI VITAMIN) TABS      ondansetron (ZOFRAN-ODT) 4 MG disintegrating tablet Take 1 tablet (4 mg total) by mouth 2 (two) times daily as needed for nausea or vomiting. (Patient not taking: Reported on 11/30/2021) 20 tablet 1   predniSONE (DELTASONE) 1 MG tablet Take 3 mg by mouth daily.     predniSONE (DELTASONE) 5 MG tablet Take 5 mg by mouth daily.     Salicylic Acid (SCALPICIN EX) Apply 1 application. topically daily as needed.     senna-docusate (SENOKOT-S) 8.6-50 MG tablet Take 1 tablet by mouth 2 (two) times daily. 30 tablet 2   triamcinolone ointment (KENALOG) 0.1 % Apply 1 application. topically 2 (two) times daily.     No current facility-administered medications for this visit.    SURGICAL HISTORY: No past surgical history on file.  REVIEW OF SYSTEMS:  Constitutional: positive for anorexia, fatigue, and weight loss Eyes: negative Ears, nose, mouth, throat, and face: negative Respiratory: negative Cardiovascular: negative Gastrointestinal: negative Genitourinary:negative Integument/breast: negative Hematologic/lymphatic: negative Musculoskeletal:positive for back pain Neurological: negative Behavioral/Psych: negative Endocrine: negative Allergic/Immunologic: negative   PHYSICAL EXAMINATION: General appearance: alert, cooperative, fatigued, and no distress Head: Normocephalic, without obvious abnormality, atraumatic Neck: no adenopathy, no JVD, supple, symmetrical, trachea midline, and  thyroid not enlarged, symmetric, no tenderness/mass/nodules Lymph nodes: Cervical, supraclavicular, and axillary nodes normal. Resp: clear to auscultation bilaterally Back: symmetric, no curvature. ROM normal. No CVA tenderness. Cardio: regular rate and rhythm, S1, S2 normal, no murmur, click, rub or gallop GI: soft, non-tender; bowel sounds normal; no masses,  no organomegaly Extremities: extremities normal, atraumatic, no cyanosis or edema Neurologic: Alert and oriented X 3, normal strength and tone. Normal symmetric reflexes. Normal coordination and gait  ECOG PERFORMANCE STATUS: 1 - Symptomatic but completely ambulatory  Blood pressure 113/60, pulse 65, temperature 98.4 F (36.9 C), temperature source Oral, resp. rate 17, weight 89 lb 4 oz (40.5 kg), SpO2 97 %.  LABORATORY DATA: Lab Results  Component Value Date   WBC 7.6 01/10/2022   HGB 11.0 (L) 01/10/2022   HCT 33.0 (L) 01/10/2022   MCV 95.9 01/10/2022   PLT 251 01/10/2022      Chemistry  Component Value Date/Time   NA 139 12/20/2021 1121   K 3.8 12/20/2021 1121   CL 102 12/20/2021 1121   CO2 31 12/20/2021 1121   BUN 11 12/20/2021 1121   CREATININE 0.64 12/20/2021 1121      Component Value Date/Time   CALCIUM 8.9 12/20/2021 1121   ALKPHOS 60 12/20/2021 1121   AST 16 12/20/2021 1121   ALT 7 12/20/2021 1121   BILITOT 0.3 12/20/2021 1121       RADIOGRAPHIC STUDIES: CT Chest W Contrast  Result Date: 01/09/2022 CLINICAL DATA:  Stage IV non-small cell right lung cancer. Assess treatment response. * Tracking Code: BO * EXAM: CT CHEST, ABDOMEN, AND PELVIS WITH CONTRAST TECHNIQUE: Multidetector CT imaging of the chest, abdomen and pelvis was performed following the standard protocol during bolus administration of intravenous contrast. RADIATION DOSE REDUCTION: This exam was performed according to the departmental dose-optimization program which includes automated exposure control, adjustment of the mA and/or kV  according to patient size and/or use of iterative reconstruction technique. CONTRAST:  23mL OMNIPAQUE IOHEXOL 300 MG/ML  SOLN COMPARISON:  11/07/2021 CT chest, abdomen and pelvis. FINDINGS: CT CHEST FINDINGS Cardiovascular: Normal heart size. No significant pericardial effusion/thickening. Three-vessel coronary atherosclerosis. Atherosclerotic thoracic aorta with stable dilated 4.2 cm ascending thoracic aorta. Normal caliber pulmonary arteries. No central pulmonary emboli. Mediastinum/Nodes: No discrete thyroid nodules. Unremarkable esophagus. No axillary adenopathy. No pathologically enlarged mediastinal or hilar nodes. Lungs/Pleura: No pneumothorax. Mild centrilobular emphysema. Irregular solid apical right upper lobe 5.8 x 5.7 cm lung mass (series 2/image 9), previously 5.4 x 5.4 cm using similar measurement technique, mildly increased. Solid 0.7 cm satellite nodule in the posterior right upper lobe (series 6/image 32), increased from 0.4 cm. Trace loculated apical right pleural effusion is stable. No left pleural effusion. Solid 1.4 cm superior segment right lower lobe nodule along the major fissure (series 6/image 33), increased from 0.8 cm. New 0.3 cm tiny solid peripheral right lower lobe pulmonary nodule (series 6/image 66). No significant left pulmonary nodules. Musculoskeletal: Stable mixed lytic and sclerotic anterior right third rib lesion. Stable lytic upper sternal slightly expansile lesion. Stable predominantly sclerotic T8 and T12 vertebral lesions with severe pathologic fractures. No new focal osseous lesions in the chest. Mild thoracic spondylosis. CT ABDOMEN PELVIS FINDINGS Hepatobiliary: Posterior right liver hypodense 1.1 cm lesion (series 2/image 49), previously 1.1 cm, stable. Direct invasion of the posterior right liver by the enlarging right adrenal mass as detailed below. No additional liver lesions. Normal gallbladder with no radiopaque cholelithiasis. No biliary ductal dilatation.  Pancreas: Normal, with no mass or duct dilation. Spleen: Normal size. No mass. Adrenals/Urinary Tract: Poorly marginated irregular 6.8 x 5.6 cm right adrenal mass with targetoid enhancement, directly invading the posterior right liver and superior right renal parenchyma, significantly increased from 4.6 x 3.5 cm. No discrete left adrenal nodule. No hydronephrosis. Subcentimeter hypodense interpolar bilateral renal cortical lesions are too small to characterize and are unchanged, for which no imaging follow-up is recommended. Normal bladder. Stomach/Bowel: Normal non-distended stomach. Normal caliber small bowel with no small bowel wall thickening. Normal appendix. Oral contrast transits to the left colon. Moderate sigmoid diverticulosis with no large bowel wall thickening or significant pericolonic fat stranding. Vascular/Lymphatic: Atherosclerotic nonaneurysmal abdominal aorta. Patent portal, splenic and renal veins. Right hepatic vein encased and probably occluded by infiltrative right adrenal metastasis. Middle and left hepatic veins are patent. Newly enlarged 1.3 cm aortocaval node (series 2/image 57). Newly enlarged 1.1 cm left para-aortic node (series 2/image 61). No  pelvic adenopathy. Reproductive: Grossly normal uterus.  No adnexal mass. Other: No pneumoperitoneum, ascites or focal fluid collection. Musculoskeletal: No aggressive appearing focal osseous lesions. Marked lumbar spondylosis. IMPRESSION: 1. Interval disease progression. 2. Apical right lung mass is mildly increased in size. Three new/enlarging right pulmonary metastases. 3. Substantial growth of large right adrenal metastasis, now directly invading the posterior right liver and upper right kidney. 4. Stable separate small posterior right liver metastasis. 5. New metastatic adenopathy to the retroperitoneum. 6. Stable right third rib and T8 and T12 vertebral bone metastases. 7. Dilated 4.2 cm ascending thoracic aorta. Recommend annual imaging  followup by CTA or MRA. This recommendation follows 2010 ACCF/AHA/AATS/ACR/ASA/SCA/SCAI/SIR/STS/SVM Guidelines for the Diagnosis and Management of Patients with Thoracic Aortic Disease. Circulation. 2010; 121: G401-U272. Aortic aneurysm NOS (ICD10-I71.9). 8. Aortic Atherosclerosis (ICD10-I70.0) and Emphysema (ICD10-J43.9). Electronically Signed   By: Ilona Sorrel M.D.   On: 01/09/2022 18:29   CT Abdomen Pelvis W Contrast  Result Date: 01/09/2022 CLINICAL DATA:  Stage IV non-small cell right lung cancer. Assess treatment response. * Tracking Code: BO * EXAM: CT CHEST, ABDOMEN, AND PELVIS WITH CONTRAST TECHNIQUE: Multidetector CT imaging of the chest, abdomen and pelvis was performed following the standard protocol during bolus administration of intravenous contrast. RADIATION DOSE REDUCTION: This exam was performed according to the departmental dose-optimization program which includes automated exposure control, adjustment of the mA and/or kV according to patient size and/or use of iterative reconstruction technique. CONTRAST:  35mL OMNIPAQUE IOHEXOL 300 MG/ML  SOLN COMPARISON:  11/07/2021 CT chest, abdomen and pelvis. FINDINGS: CT CHEST FINDINGS Cardiovascular: Normal heart size. No significant pericardial effusion/thickening. Three-vessel coronary atherosclerosis. Atherosclerotic thoracic aorta with stable dilated 4.2 cm ascending thoracic aorta. Normal caliber pulmonary arteries. No central pulmonary emboli. Mediastinum/Nodes: No discrete thyroid nodules. Unremarkable esophagus. No axillary adenopathy. No pathologically enlarged mediastinal or hilar nodes. Lungs/Pleura: No pneumothorax. Mild centrilobular emphysema. Irregular solid apical right upper lobe 5.8 x 5.7 cm lung mass (series 2/image 9), previously 5.4 x 5.4 cm using similar measurement technique, mildly increased. Solid 0.7 cm satellite nodule in the posterior right upper lobe (series 6/image 32), increased from 0.4 cm. Trace loculated apical right  pleural effusion is stable. No left pleural effusion. Solid 1.4 cm superior segment right lower lobe nodule along the major fissure (series 6/image 33), increased from 0.8 cm. New 0.3 cm tiny solid peripheral right lower lobe pulmonary nodule (series 6/image 66). No significant left pulmonary nodules. Musculoskeletal: Stable mixed lytic and sclerotic anterior right third rib lesion. Stable lytic upper sternal slightly expansile lesion. Stable predominantly sclerotic T8 and T12 vertebral lesions with severe pathologic fractures. No new focal osseous lesions in the chest. Mild thoracic spondylosis. CT ABDOMEN PELVIS FINDINGS Hepatobiliary: Posterior right liver hypodense 1.1 cm lesion (series 2/image 49), previously 1.1 cm, stable. Direct invasion of the posterior right liver by the enlarging right adrenal mass as detailed below. No additional liver lesions. Normal gallbladder with no radiopaque cholelithiasis. No biliary ductal dilatation. Pancreas: Normal, with no mass or duct dilation. Spleen: Normal size. No mass. Adrenals/Urinary Tract: Poorly marginated irregular 6.8 x 5.6 cm right adrenal mass with targetoid enhancement, directly invading the posterior right liver and superior right renal parenchyma, significantly increased from 4.6 x 3.5 cm. No discrete left adrenal nodule. No hydronephrosis. Subcentimeter hypodense interpolar bilateral renal cortical lesions are too small to characterize and are unchanged, for which no imaging follow-up is recommended. Normal bladder. Stomach/Bowel: Normal non-distended stomach. Normal caliber small bowel with no small bowel wall thickening.  Normal appendix. Oral contrast transits to the left colon. Moderate sigmoid diverticulosis with no large bowel wall thickening or significant pericolonic fat stranding. Vascular/Lymphatic: Atherosclerotic nonaneurysmal abdominal aorta. Patent portal, splenic and renal veins. Right hepatic vein encased and probably occluded by infiltrative  right adrenal metastasis. Middle and left hepatic veins are patent. Newly enlarged 1.3 cm aortocaval node (series 2/image 57). Newly enlarged 1.1 cm left para-aortic node (series 2/image 61). No pelvic adenopathy. Reproductive: Grossly normal uterus.  No adnexal mass. Other: No pneumoperitoneum, ascites or focal fluid collection. Musculoskeletal: No aggressive appearing focal osseous lesions. Marked lumbar spondylosis. IMPRESSION: 1. Interval disease progression. 2. Apical right lung mass is mildly increased in size. Three new/enlarging right pulmonary metastases. 3. Substantial growth of large right adrenal metastasis, now directly invading the posterior right liver and upper right kidney. 4. Stable separate small posterior right liver metastasis. 5. New metastatic adenopathy to the retroperitoneum. 6. Stable right third rib and T8 and T12 vertebral bone metastases. 7. Dilated 4.2 cm ascending thoracic aorta. Recommend annual imaging followup by CTA or MRA. This recommendation follows 2010 ACCF/AHA/AATS/ACR/ASA/SCA/SCAI/SIR/STS/SVM Guidelines for the Diagnosis and Management of Patients with Thoracic Aortic Disease. Circulation. 2010; 121: F681-E751. Aortic aneurysm NOS (ICD10-I71.9). 8. Aortic Atherosclerosis (ICD10-I70.0) and Emphysema (ICD10-J43.9). Electronically Signed   By: Ilona Sorrel M.D.   On: 01/09/2022 18:29    ASSESSMENT AND PLAN: This is a very pleasant 76 years old white female diagnosed with Stage IV (T3, N2, M1 C) non-small cell lung cancer with unknown histologic subtype, likely adenocarcinoma with positive KRAS G12C mutation.  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  Status post palliative radiotherapy to the painful bone lesions under the care of Dr. Sondra Come.  The patient started induction systemic chemotherapy with carboplatin for  AUC of 5, Alimta 500 Mg/M2 and Keytruda 200 Mg IV every 3 weeks and currently on maintenance treatment with single agent Alimta after Keytruda was discontinued starting from cycle #7 because of significant arthralgia and myalgia.  She is status post 18 cycles of total treatment.  The patient has been tolerating this treatment well except for fatigue. She underwent palliative radiotherapy to enlarging pulmonary nodule under the care of Dr. Sondra Come. She had repeat CT scan of the chest, abdomen and pelvis performed recently.  I personally and independently reviewed the scan images and discussed the results with the patient and her husband and showed them the images. Unfortunately her scan showed evidence for disease progression with increase in the size of the apical right lung mass in addition to new and enlarging pulmonary metastasis as well as significant increase in the large right adrenal metastasis and new metastatic adenopathy to the retroperitoneum. I recommended for the patient to discontinue her current treatment with maintenance Alimta at this point because of lack of benefit. I discussed with the patient other treatment options including palliative care versus second line treatment with targeted therapy because of the positive KRAS G12C mutation.  I recommended for treatment with Krazati (Adagrasib) 600 mg p.o. twice daily. I discussed with the patient the adverse effect of this treatment and she will also meet with the pharmacist for oral oncolytic for education as well as helping her with the insurance coverage of her medication.  She is expected to start the first dose of this treatment in the next few days. I will arrange for  the patient to have a EKG today as a baseline to rule out QT prolongation. For pain management she will continue her current treatment with MS Contin and Dilaudid on as-needed basis. For the lack of appetite she will continue on Marinol. For the metastatic bone disease,  the patient will continue her current treatment with Zometa. She will come back for follow-up visit in around 2-3 weeks for evaluation and management of any adverse effect of her treatment and also repeat blood work. The patient was advised to call immediately if she has any concerning symptoms in the interval. The patient voices understanding of current disease status and treatment options and is in agreement with the current care plan.  All questions were answered. The patient knows to call the clinic with any problems, questions or concerns. We can certainly see the patient much sooner if necessary.  The total time spent in the appointment was 20 minutes.  Disclaimer: This note was dictated with voice recognition software. Similar sounding words can inadvertently be transcribed and may not be corrected upon review.

## 2022-01-10 NOTE — Progress Notes (Signed)
DISCONTINUE ON PATHWAY REGIMEN - Non-Small Cell Lung     A cycle is every 21 days:     Pembrolizumab      Pemetrexed      Carboplatin   **Always confirm dose/schedule in your pharmacy ordering system**  REASON: Disease Progression PRIOR TREATMENT: [id number]: Pembrolizumab 200 mg + Pemetrexed 500 mg/m2 + Carboplatin AUC=5 q21 Days x 4 Cycles TREATMENT RESPONSE: Partial Response (PR)  START OFF PATHWAY REGIMEN - Non-Small Cell Lung   [id number]:Adagrasib 600 mg PO BID D1-28 q28 Days:   A cycle is every 28 days:     Adagrasib   **Always confirm dose/schedule in your pharmacy ordering system**  Patient Characteristics: Stage IV Metastatic, Nonsquamous, Molecular Analysis Completed, Molecular Alteration Present and Eligible for Molecular Targeted Therapy, Second Line - Molecular Targeted Therapy, KRAS G12C Mutation Positive Therapeutic Status: Stage IV Metastatic Histology: Nonsquamous Cell Broad Molecular Profiling Status: Molecular Analysis Completed Molecular Analysis Results: Alteration Present and Eligible for Molecular Targeted Therapy Molecular Alteration Present: KRAS G12C Mutation Positive Molecular Targeted Line of Therapy: [facility] visitor Targeted Therapy Intent of Therapy: Non-Curative / Palliative Intent, Discussed with Patient

## 2022-01-10 NOTE — Progress Notes (Signed)
[place]  Telephone:(336) [phone] Fax:(336) [phone]   Name: Kristi Jennings Date: 01/10/2022 MRN: [id number]  DOB: 1945/05/26  Patient Care Team: [name], NP as PCP - General (Family Medicine) [place] Registered Nurse [facility] and Palliative Medicine)    INTERVAL HISTORY: Kristi Jennings is a 76 y.o. 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 Palliative ask to see for symptom management.  SOCIAL HISTORY:     reports that she quit smoking about 13 years ago. Her smoking use included cigarettes. She has a 43.00 pack-year smoking history. She has never used smokeless tobacco. She reports that she does not currently use alcohol. She reports that she does not use drugs.  ADVANCE DIRECTIVES:  None on file   CODE STATUS:   PAST MEDICAL HISTORY: Past Medical History:  Diagnosis Date   Borderline diabetes mellitus    Bronchogenic cancer of right lung ([place])    Dyslipidemia    History of radiation therapy    right chest 12/21/2020-12/24/2020  Dr Gery Pray   History of radiation therapy    right lung 06/07/2021-06/17/2021  Dr Gery Pray   Hypertension    Polymyalgia rheumatica ([place])    ALLERGIES:  is allergic to shellfish-derived products and augmentin [amoxicillin-pot clavulanate].  MEDICATIONS:  Current Outpatient Medications  Medication Sig Dispense Refill   acetaminophen (TYLENOL) 650 MG CR tablet Take 1,300 mg by mouth 3 (three) times daily as needed for pain.     aspirin EC 81 MG tablet Take 81 mg by mouth 4 (four) times a week. Swallow whole. (Patient not taking: Reported on 11/09/2021)     atenolol (TENORMIN) 25 MG tablet Take 12.5 mg by mouth at bedtime.     Calcium-Vitamin D-Vitamin K (CALCIUM + D) [phone] MG-UNT-MCG CHEW      Cholecalciferol (VITAMIN D) 50 MCG (2000 UT) tablet      diclofenac  Sodium (VOLTAREN) 1 % GEL Apply 4 g topically 4 (four) times daily. (Patient taking differently: Apply 4 g topically 4 (four) times daily as needed (pain).) 150 g 0   dronabinol (MARINOL) 5 MG capsule Take 1 capsule (5 mg total) by mouth 2 (two) times daily before a meal. 60 capsule 1   enoxaparin (LOVENOX) 60 MG/0.6ML injection Inject 0.6 mLs (60 mg total) into the skin daily. 18 mL 2   escitalopram (LEXAPRO) 20 MG tablet Take 1 tablet (20 mg total) by mouth at bedtime. 30 tablet 1   fexofenadine (ALLEGRA) 180 MG tablet Take 180 mg by mouth daily.     fluocinonide (LIDEX) 0.05 % external solution Apply topically daily.     folic acid (FOLVITE) 1 MG tablet TAKE ONE TABLET BY MOUTH ONE TIME DAILY 30 tablet 2   lidocaine (LIDODERM) 5 % Place 1 patch onto the skin daily. Remove & Discard patch within 12 hours or as directed by MD (Patient not taking: Reported on 11/09/2021) 30 patch 0   mirtazapine (REMERON) 15 MG tablet Take 15 mg by mouth at bedtime.     morphine (MS CONTIN) 30 MG 12 hr tablet Take 1 tablet (30 mg total) by mouth every 12 (twelve) hours. 60 tablet 0   Multiple Vitamin (MULTI VITAMIN) TABS      ondansetron (ZOFRAN-ODT) 4 MG disintegrating tablet Take 1 tablet (4 mg total) by mouth 2 (two) times daily as needed for nausea or vomiting. (  Patient not taking: Reported on 11/30/2021) 20 tablet 1   predniSONE (DELTASONE) 1 MG tablet Take 3 mg by mouth daily.     predniSONE (DELTASONE) 5 MG tablet Take 5 mg by mouth daily.     Salicylic Acid (SCALPICIN EX) Apply 1 application. topically daily as needed.     senna-docusate (SENOKOT-S) 8.6-50 MG tablet Take 1 tablet by mouth 2 (two) times daily. 30 tablet 2   triamcinolone ointment (KENALOG) 0.1 % Apply 1 application. topically 2 (two) times daily.     No current facility-administered medications for this visit.    VITAL SIGNS: There were no vitals taken for this visit. There were no vitals filed for this visit.   Estimated body mass  index is 14.41 kg/m as calculated from the following:   Height as of 11/30/21: 5\' 6"  (1.676 m).   Weight as of an earlier encounter on 01/10/22: 89 lb 4 oz (40.5 kg).  PERFORMANCE STATUS (ECOG) : 1 - Symptomatic but completely ambulatory  Physical Exam General: NAD, ambulatory Resp: Normal breathing pattern Neurological: AAO x4, mood appropriate   IMPRESSION:  Kristi Jennings presents to clinic today with her husband for ongoing symptom management support. Reports some increase in fatigue over past several weeks. Trying to remain as active as possible.   Pain Kristi Jennings reports her pain is well controlled. We initially weaned her down to MS Contin 15mg  once daily however due to reoccurring pain and discomfort required to restart twice daily dosing. Fortunately pain has improved. She is not having to use breakthrough medication.   We discussed plans to re-attempt weaning down MS Contin in the near future. She and husband verbalized understanding and appreciation.   We will plan to evaluate over the next few weeks, if pain remains well controlled the goal will be to re-attempt weaning and eventually discontinue.    Constipation No concerns with constipation.  Continues to take MiraLAX and senna as needed for bowel regimen.  Anxiety Her anxiety has decreased significantly mainly due to to her improved wellbeing and decreased pain.  She states she is focusing on continued improvement and hopes things remain stable.   Decreased appetite/Weight loss Appetite continues to fluctuate. Some days are better than others. She is tolerating dronabinol however does not consistently take twice daily. Education provided on taking 10mg  daily to gain best response and allow for true effectiveness prior to increasing dosing. She and husband verbalized understanding.     Her weight has dropped some down to 89lbs from 91lbs on 8/1.89 lbs 4/19,  88.8 lbs on 3/29, 90 lbs on 3/8,  88lbs 2/15, 89lbs on 1/25 and  92lb on 1/11.  We will plan to monitor closely and adjust as needed.    PLAN: Continue MS Contin as prescribed. We will continue to closely evaluate and began to wean down and eventually discontinue if possible.  Dronabinol 5 mg twice daily for appetite. Advised to take 10mg  daily if she is having difficulty taking in the evening.  Ongoing symptom management (see above) will plan to re-attempt weaning of MS Contin given pain is resolving again.  I will plan to see patient back in 4-6 weeks in collaboration with her other appointments.    Patient expressed understanding and was in agreement with this plan. She also understands that She can call the clinic at any time with any questions, concerns, or complaints.         Any controlled substances utilized were prescribed in the context of palliative  care. PDMP has been reviewed.   Time Total: 30 min   Visit consisted of counseling and education dealing with the complex and emotionally intense issues of symptom management and palliative care in the setting of serious and potentially life-threatening illness.Greater than 50%  of this time was spent counseling and coordinating care related to the above assessment and plan.  Alda Lea, AGPCNP-BC  Palliative Medicine Team/[place]

## 2022-01-10 NOTE — Telephone Encounter (Signed)
Oral Oncology Patient Advocate Encounter   Received notification that prior authorization for Alfred Levins is required.   PA submitted on 01/10/2022 Key [id number] Status is pending     Lady Deutscher, CPhT-Adv Pharmacy Patient Advocate Specialist [place] Patient Advocate Team Direct Number: [phone]  Fax: [phone]

## 2022-01-11 ENCOUNTER — Telehealth: Payer: Self-pay | Admitting: Pharmacy Technician

## 2022-01-11 ENCOUNTER — Other Ambulatory Visit (HOSPITAL_COMMUNITY): Payer: Self-pay

## 2022-01-11 MED ORDER — ADAGRASIB 200 MG PO TABS: 600.0000 mg | ORAL_TABLET | Freq: Two times a day (BID) | ORAL | 3 refills | Status: DC

## 2022-01-11 MED ORDER — ADAGRASIB 200 MG PO TABS: 600.0000 mg | ORAL_TABLET | Freq: Two times a day (BID) | ORAL | 0 refills | Status: DC

## 2022-01-11 NOTE — Telephone Encounter (Signed)
Oral Oncology Patient Advocate Encounter   Submitted application for assistance for Krazati to [facility] & Me via online portal.   [employer] phone number [phone].   I will continue to check the status until final determination.   Lady Deutscher, CPhT-Adv Pharmacy Patient Advocate Specialist [place] Patient Advocate Team Direct Number: [phone]  Fax: [phone]

## 2022-01-11 NOTE — Telephone Encounter (Signed)
Oral Oncology Pharmacist Encounter  Patient approved for one time free 1 month trial of Krazati while waiting for final approval through [facility] and Me Patient Assistance Program. Rx for [place] with 0 refills sent for free trial program for processing and dispensing. Estimated turn around time is ~24 hours per representative.   Alfred Levins Rx originally sent to [employer] on 01/10/22 with 3 refills has also been redirected to [place] for their program to have on file for processing for future fills once approved through their assistance program.   Leron Croak, PharmD, BCPS, [facility] Hematology/Oncology Clinical Pharmacist [facility] and [place] [phone] 01/11/2022 1:13 PM

## 2022-01-11 NOTE — Telephone Encounter (Signed)
Oral Oncology Patient Advocate Encounter   Was successful in enrolling patient in a free trial for Krazati via online portal.  [employer] Me will contact patient to set up shipment of 1 free 30-day supply of medication.  [facility] [employer] Me phone [place], [place] Patient Advocate Specialist [place] Patient Advocate Team Direct Number: [phone]  Fax: [phone]

## 2022-01-12 ENCOUNTER — Telehealth: Payer: Self-pay | Admitting: Pharmacist

## 2022-01-12 ENCOUNTER — Other Ambulatory Visit (HOSPITAL_COMMUNITY): Payer: Self-pay

## 2022-01-12 ENCOUNTER — Telehealth: Payer: Self-pay

## 2022-01-12 ENCOUNTER — Encounter: Payer: Self-pay | Admitting: Physician Assistant

## 2022-01-12 ENCOUNTER — Other Ambulatory Visit: Payer: Self-pay | Admitting: Physician Assistant

## 2022-01-12 DIAGNOSIS — C3491 Malignant neoplasm of unspecified part of right bronchus or lung: Secondary | ICD-10-CM | POA: Diagnosis not present

## 2022-01-12 NOTE — Telephone Encounter (Signed)
Oral Chemotherapy Pharmacist Encounter   Notified by [place], RN patient's husband called with specific questions regarding new medication Alfred Levins Education officer, museum) for Kristi Jennings.   Called and spoke with patient's husband to address questions specifically regarding how the medication worked. We discussed in great detail that [place] irreversibly inhibits KRAS G12C, which ultimately prevents KRAS G12C from further downstream signaling, with the goal of resulting in tumor regression.  Patient's husband also expressed concern that the daily dose prescribed of 600 mg twice daily seemed like a "big dose". I explained to patient's husband that this is the standard starting dose of Krazati and that is impossible to compare the strengths between two different types of medications due to drug dosing being 1) drug specific and 2) how the drug was studied and developed in clinical trials.   Lastly, we reviewed again potential side effects of Krazati that include but are not limited to: diarrhea, nausea/vomiting, hepatotoxicity, edema, fatigue, decrease in blood counts.  - Advised to administer Alfred Levins with food as this can help with GI upset.  - Advised to have Imodium (loperamide) on had to use as needed for diarrhea. He stated they have already picked up Imodium to have on hand. He is aware to let the office know if patient has 4 or more episodes of diarrhea over baseline.  Patient's husband expressed appreciation and understanding. I provided him with my direct phone number for the oral chemotherapy clinic and encouraged him to call if further questions arise.   Leron Croak, PharmD, BCPS, [facility] Hematology/Oncology Clinical Pharmacist [facility] and [place] [phone] 01/12/2022 4:08 PM

## 2022-01-12 NOTE — Telephone Encounter (Signed)
Pt husband called LVM did not give reason or details. Attempted to call back, no answer LVM and call back number

## 2022-01-12 NOTE — Telephone Encounter (Signed)
Pt returned call asking questions about oral chemo, connected Leron Croak, [facility] for further chemo education.

## 2022-01-13 ENCOUNTER — Telehealth: Payer: Self-pay

## 2022-01-13 NOTE — Telephone Encounter (Signed)
This RN called pt husband craig, to review any lingering questions. [name] reports that it was very helpful to speak with rebecca, RPH yesterday. [name] verbalized understanding of why the pt is changing medications and reported a basic understanding of how the medication works. No further questions or needs at this time.

## 2022-01-16 ENCOUNTER — Telehealth: Payer: Self-pay

## 2022-01-16 NOTE — Telephone Encounter (Signed)
Returned call to patient's husband regarding cancelled appointment this Wednesday, husband verified that they canceled the palliative care appointment. Patient's husband requesting status of "letter" that [name], NP is sending regarding Dranabinol for appetite stimulation. RN told patient's husband this will be looked into. Patient's husband expressed thanks and understanding.

## 2022-01-17 ENCOUNTER — Telehealth: Payer: Self-pay | Admitting: Internal Medicine

## 2022-01-17 DIAGNOSIS — M1991 Primary osteoarthritis, unspecified site: Secondary | ICD-10-CM | POA: Diagnosis not present

## 2022-01-17 DIAGNOSIS — Z681 Body mass index (BMI) 19 or less, adult: Secondary | ICD-10-CM | POA: Diagnosis not present

## 2022-01-17 DIAGNOSIS — M4854XA Collapsed vertebra, not elsewhere classified, thoracic region, initial encounter for fracture: Secondary | ICD-10-CM | POA: Diagnosis not present

## 2022-01-17 DIAGNOSIS — M810 Age-related osteoporosis without current pathological fracture: Secondary | ICD-10-CM | POA: Diagnosis not present

## 2022-01-17 DIAGNOSIS — M154 Erosive (osteo)arthritis: Secondary | ICD-10-CM | POA: Diagnosis not present

## 2022-01-17 DIAGNOSIS — C3490 Malignant neoplasm of unspecified part of unspecified bronchus or lung: Secondary | ICD-10-CM | POA: Diagnosis not present

## 2022-01-17 DIAGNOSIS — M0579 Rheumatoid arthritis with rheumatoid factor of multiple sites without organ or systems involvement: Secondary | ICD-10-CM | POA: Diagnosis not present

## 2022-01-17 NOTE — Telephone Encounter (Signed)
Scheduled per 08/22 los, patient has been called and notified.

## 2022-01-18 ENCOUNTER — Telehealth: Payer: Medicare HMO | Admitting: Nurse Practitioner

## 2022-01-18 DIAGNOSIS — R928 Other abnormal and inconclusive findings on diagnostic imaging of breast: Secondary | ICD-10-CM | POA: Diagnosis not present

## 2022-01-18 DIAGNOSIS — Z780 Asymptomatic menopausal state: Secondary | ICD-10-CM | POA: Diagnosis not present

## 2022-01-18 DIAGNOSIS — M810 Age-related osteoporosis without current pathological fracture: Secondary | ICD-10-CM | POA: Diagnosis not present

## 2022-01-18 DIAGNOSIS — Z1231 Encounter for screening mammogram for malignant neoplasm of breast: Secondary | ICD-10-CM | POA: Diagnosis not present

## 2022-01-18 DIAGNOSIS — M8589 Other specified disorders of bone density and structure, multiple sites: Secondary | ICD-10-CM | POA: Diagnosis not present

## 2022-01-18 LAB — HM DEXA SCAN

## 2022-01-19 ENCOUNTER — Telehealth: Payer: Self-pay

## 2022-01-19 ENCOUNTER — Encounter: Payer: Self-pay | Admitting: *Deleted

## 2022-01-19 ENCOUNTER — Other Ambulatory Visit: Payer: Self-pay | Admitting: Nurse Practitioner

## 2022-01-19 ENCOUNTER — Other Ambulatory Visit: Payer: Self-pay | Admitting: Physician Assistant

## 2022-01-19 DIAGNOSIS — C3490 Malignant neoplasm of unspecified part of unspecified bronchus or lung: Secondary | ICD-10-CM | POA: Diagnosis not present

## 2022-01-19 DIAGNOSIS — C7951 Secondary malignant neoplasm of bone: Secondary | ICD-10-CM

## 2022-01-19 DIAGNOSIS — C3491 Malignant neoplasm of unspecified part of right bronchus or lung: Secondary | ICD-10-CM

## 2022-01-19 MED ORDER — MORPHINE SULFATE ER 30 MG PO TBCR: 30.0000 mg | EXTENDED_RELEASE_TABLET | Freq: Two times a day (BID) | ORAL | 0 refills | Status: DC

## 2022-01-19 NOTE — Telephone Encounter (Signed)
Husband called requesting refill on MS Contin. Routed to provider.

## 2022-01-24 DIAGNOSIS — R922 Inconclusive mammogram: Secondary | ICD-10-CM | POA: Diagnosis not present

## 2022-01-24 DIAGNOSIS — N6489 Other specified disorders of breast: Secondary | ICD-10-CM | POA: Diagnosis not present

## 2022-01-24 DIAGNOSIS — R928 Other abnormal and inconclusive findings on diagnostic imaging of breast: Secondary | ICD-10-CM | POA: Diagnosis not present

## 2022-01-26 NOTE — Telephone Encounter (Signed)
Oral Oncology Patient Advocate Encounter   Received notification that the application for assistance for Krazati through [place] Me has been approved.   [place] & Me phone number [phone].   Effective dates: 01/26/2022 through 05/21/2022  Patient was notified of approval by the program representative, Jinny Blossom, on 01/26/2022.  Lady Deutscher, CPhT-Adv Oncology Pharmacy Patient [place] Direct Number: [phone]  Fax: [phone]

## 2022-01-26 NOTE — Progress Notes (Unsigned)
   Interim Update [facility] is the home-based palliative care program of [facility] [place].  Primary Diagnosis:  metastatic lung cancer with mets in bone, liver, lymph nodes Other significant diagnosis:  type 2 DM, rheumatoid arthritis, hx of nicotine dependence Admitted to [facility]: Jan 25, 2021 Current and previous weight:  90.8 lb in Feb 2023 (88.2 lb in Sep 2022) Current and previous MAC:  18.7 cm (17.7 cm in May 2023, 21 cm in Oct 2022) Current and previous PPS: 80% / 50% on admission Hospitalization/ED visit in the last 3 months: none Current symptoms/issues/interventions: Patient is receiving chemotherapy every 3 weeks and daily injections for clot prevention. We offer teaching on medication administration (including injectables) and therapeutic listening as family copes with challenges of disease and treatment. We monitor the patient's blood pressure and spO2 via devices in the home. Signs of decline over last 3 months: ongoing challenges with appetite Social Work Interventions: Remains available for PRN phone support or visits.  Goals of Care: continue gaining weight and increasing stamina (eg walking) Advance Directives: DNR

## 2022-01-29 ENCOUNTER — Other Ambulatory Visit: Payer: Self-pay | Admitting: Family Medicine

## 2022-01-31 ENCOUNTER — Ambulatory Visit: Payer: Medicare HMO

## 2022-01-31 ENCOUNTER — Inpatient Hospital Stay: Payer: Medicare HMO | Attending: Nurse Practitioner

## 2022-01-31 ENCOUNTER — Inpatient Hospital Stay (HOSPITAL_BASED_OUTPATIENT_CLINIC_OR_DEPARTMENT_OTHER): Payer: Medicare HMO | Admitting: Internal Medicine

## 2022-01-31 ENCOUNTER — Inpatient Hospital Stay: Payer: Medicare HMO | Admitting: Nutrition

## 2022-01-31 ENCOUNTER — Ambulatory Visit: Payer: Medicare HMO | Admitting: Nutrition

## 2022-01-31 ENCOUNTER — Other Ambulatory Visit: Payer: Self-pay

## 2022-01-31 VITALS — BP 129/74 | HR 60 | Temp 97.8°F | Resp 18 | Wt 89.2 lb

## 2022-01-31 DIAGNOSIS — C7971 Secondary malignant neoplasm of right adrenal gland: Secondary | ICD-10-CM | POA: Diagnosis not present

## 2022-01-31 DIAGNOSIS — Z5111 Encounter for antineoplastic chemotherapy: Secondary | ICD-10-CM

## 2022-01-31 DIAGNOSIS — I10 Essential (primary) hypertension: Secondary | ICD-10-CM | POA: Diagnosis not present

## 2022-01-31 DIAGNOSIS — J90 Pleural effusion, not elsewhere classified: Secondary | ICD-10-CM | POA: Diagnosis not present

## 2022-01-31 DIAGNOSIS — C3491 Malignant neoplasm of unspecified part of right bronchus or lung: Secondary | ICD-10-CM | POA: Diagnosis not present

## 2022-01-31 DIAGNOSIS — C787 Secondary malignant neoplasm of liver and intrahepatic bile duct: Secondary | ICD-10-CM | POA: Diagnosis not present

## 2022-01-31 DIAGNOSIS — C3411 Malignant neoplasm of upper lobe, right bronchus or lung: Secondary | ICD-10-CM | POA: Diagnosis not present

## 2022-01-31 DIAGNOSIS — Z923 Personal history of irradiation: Secondary | ICD-10-CM | POA: Insufficient documentation

## 2022-01-31 DIAGNOSIS — C7951 Secondary malignant neoplasm of bone: Secondary | ICD-10-CM | POA: Diagnosis not present

## 2022-01-31 LAB — CBC WITH DIFFERENTIAL (CANCER CENTER ONLY)
Abs Immature Granulocytes: 0.03 10*3/uL (ref 0.00–0.07)
Basophils Absolute: 0 10*3/uL (ref 0.0–0.1)
Basophils Relative: 1 %
Eosinophils Absolute: 0.1 10*3/uL (ref 0.0–0.5)
Eosinophils Relative: 2 %
HCT: 38.4 % (ref 36.0–46.0)
Hemoglobin: 12.5 g/dL (ref 12.0–15.0)
Immature Granulocytes: 1 %
Lymphocytes Relative: 21 %
Lymphs Abs: 1.2 10*3/uL (ref 0.7–4.0)
MCH: 30.6 pg (ref 26.0–34.0)
MCHC: 32.6 g/dL (ref 30.0–36.0)
MCV: 94.1 fL (ref 80.0–100.0)
Monocytes Absolute: 0.8 10*3/uL (ref 0.1–1.0)
Monocytes Relative: 15 %
Neutro Abs: 3.4 10*3/uL (ref 1.7–7.7)
Neutrophils Relative %: 60 %
Platelet Count: 234 10*3/uL (ref 150–400)
RBC: 4.08 MIL/uL (ref 3.87–5.11)
RDW: 14 % (ref 11.5–15.5)
WBC Count: 5.6 10*3/uL (ref 4.0–10.5)
nRBC: 0 % (ref 0.0–0.2)

## 2022-01-31 LAB — CMP (CANCER CENTER ONLY)
ALT: 21 U/L (ref 0–44)
AST: 32 U/L (ref 15–41)
Albumin: 4 g/dL (ref 3.5–5.0)
Alkaline Phosphatase: 66 U/L (ref 38–126)
Anion gap: 3 — ABNORMAL LOW (ref 5–15)
BUN: 19 mg/dL (ref 8–23)
CO2: 34 mmol/L — ABNORMAL HIGH (ref 22–32)
Calcium: 9.3 mg/dL (ref 8.9–10.3)
Chloride: 97 mmol/L — ABNORMAL LOW (ref 98–111)
Creatinine: 0.99 mg/dL (ref 0.44–1.00)
GFR, Estimated: 59 mL/min — ABNORMAL LOW (ref >60)
Glucose, Bld: 126 mg/dL — ABNORMAL HIGH (ref 70–99)
Potassium: 3.5 mmol/L (ref 3.5–5.1)
Sodium: 134 mmol/L — ABNORMAL LOW (ref 135–145)
Total Bilirubin: 0.3 mg/dL (ref 0.3–1.2)
Total Protein: 7.2 g/dL (ref 6.5–8.1)

## 2022-01-31 NOTE — Progress Notes (Signed)
[place] [phone]   [phone]  OFFICE PROGRESS NOTE  [name], NP [address] [place] [address]  DIAGNOSIS: Stage IV (T3, N2, M1 C) non-small cell lung cancer with unknown histologic subtype.  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   DETECTED ALTERATION(S) / BIOMARKER(S)      % CFDNA OR AMPLIFICATION        ASSOCIATED FDA-APPROVED THERAPIES         CLINICAL TRIAL AVAILABILITY [id number] 8.9%   Sotorasib Yes [id number] 7.2% None     Yes     PRIOR THERAPY:   1) Palliative radiotherapy to the painful bone lesions under the care of Dr. Sondra Come.  2) Palliative systemic chemotherapy with carboplatin for AUC of 5, Alimta 500 Mg/M2 and Keytruda 200 Mg IV every 3 weeks.  First dose of treatment on December 29, 2020. Status post 18  cycles.  Starting from cycle #5, the patient is going to start maintenance Alimta and Keytruda. Keytruda discontinued from cycle #7 due to myalgias.  Last cycle was given on December 20, 2021 discontinued secondary to disease progression. 3) Possible palliative radiation to the enlarging pulmonary mass under the care of Dr. Sondra Come.   CURRENT THERAPY: 1) Krazati (Adagrasib) 600 Mg p.o. twice daily.  First dose started January 17, 2022 2) Zometa every 6 weeks starting on 02/10/21   INTERVAL HISTORY: Kristi Jennings 76 y.o. female returns to the clinic today for follow-up visit.  The patient is feeling fine today with no concerning complaints.  Her stamina is improving.  She had 1 or 2 episodes of diarrhea but no significant nausea or vomiting.  She has no chest pain, shortness of breath, cough or hemoptysis.  She has no fever or chills.  She has been tolerating her treatment with Alfred Levins (Adagrasib) fairly well.  She is here today for  evaluation and repeat blood work.  MEDICAL HISTORY: Past Medical History:  Diagnosis Date   Borderline diabetes mellitus    Bronchogenic cancer of right lung ([place])    Dyslipidemia    History of radiation therapy    right chest 12/21/2020-12/24/2020  Dr Gery Pray   History of radiation therapy    right lung 06/07/2021-06/17/2021  Dr Gery Pray   Hypertension    Polymyalgia rheumatica ([place])     ALLERGIES:  is allergic to shellfish-derived products and augmentin [amoxicillin-pot clavulanate].  MEDICATIONS:  Current Outpatient Medications  Medication Sig Dispense Refill   acetaminophen (TYLENOL) 650 MG CR tablet Take 1,300 mg by mouth 3 (three) times daily as needed for pain.     adagrasib (KRAZATI) 200 MG tablet Take 3 tablets (600 mg total) by mouth 2 (two) times daily. 180 tablet 0   aspirin EC 81 MG tablet Take 81 mg by mouth 4 (four) times a week. Swallow whole. (Patient not taking: Reported on 11/09/2021)     atenolol (TENORMIN) 25 MG tablet Take 12.5 mg by mouth at bedtime.     Calcium-Vitamin D-Vitamin K (CALCIUM + D) [phone] MG-UNT-MCG CHEW      Cholecalciferol (VITAMIN D) 50 MCG (2000 UT) tablet      diclofenac Sodium (VOLTAREN) 1 % GEL Apply 4 g topically 4 (four) times daily. (Patient taking differently: Apply 4 g topically 4 (four) times daily as  needed (pain).) 150 g 0   dronabinol (MARINOL) 5 MG capsule Take 1 capsule (5 mg total) by mouth 2 (two) times daily before a meal. 60 capsule 1   enoxaparin (LOVENOX) 60 MG/0.6ML injection Inject 0.6 mLs (60 mg total) into the skin daily. 18 mL 2   escitalopram (LEXAPRO) 20 MG tablet Take 1 tablet (20 mg total) by mouth at bedtime. 90 tablet 0   fexofenadine (ALLEGRA) 180 MG tablet Take 180 mg by mouth daily.     fluocinonide (LIDEX) 0.05 % external solution Apply topically daily.     folic acid (FOLVITE) 1 MG tablet TAKE 1 TABLET BY MOUTH ONE TIME DAILY 30 tablet 2   lidocaine (LIDODERM) 5 % Place 1 patch onto the skin daily.  Remove & Discard patch within 12 hours or as directed by MD (Patient not taking: Reported on 11/09/2021) 30 patch 0   mirtazapine (REMERON) 15 MG tablet Take 15 mg by mouth at bedtime.     morphine (MS CONTIN) 30 MG 12 hr tablet Take 1 tablet (30 mg total) by mouth every 12 (twelve) hours. 60 tablet 0   Multiple Vitamin (MULTI VITAMIN) TABS      ondansetron (ZOFRAN-ODT) 4 MG disintegrating tablet Take 1 tablet (4 mg total) by mouth 2 (two) times daily as needed for nausea or vomiting. (Patient not taking: Reported on 11/30/2021) 20 tablet 1   predniSONE (DELTASONE) 1 MG tablet Take 3 mg by mouth daily.     predniSONE (DELTASONE) 5 MG tablet Take 5 mg by mouth daily.     Salicylic Acid (SCALPICIN EX) Apply 1 application. topically daily as needed.     senna-docusate (SENOKOT-S) 8.6-50 MG tablet Take 1 tablet by mouth 2 (two) times daily. 30 tablet 2   triamcinolone ointment (KENALOG) 0.1 % Apply 1 application. topically 2 (two) times daily.     No current facility-administered medications for this visit.    SURGICAL HISTORY: No past surgical history on file.  REVIEW OF SYSTEMS:  A comprehensive review of systems was negative except for: Constitutional: positive for fatigue Gastrointestinal: positive for diarrhea   PHYSICAL EXAMINATION: General appearance: alert, cooperative, fatigued, and no distress Head: Normocephalic, without obvious abnormality, atraumatic Neck: no adenopathy, no JVD, supple, symmetrical, trachea midline, and thyroid not enlarged, symmetric, no tenderness/mass/nodules Lymph nodes: Cervical, supraclavicular, and axillary nodes normal. Resp: clear to auscultation bilaterally Back: symmetric, no curvature. ROM normal. No CVA tenderness. Cardio: regular rate and rhythm, S1, S2 normal, no murmur, click, rub or gallop GI: soft, non-tender; bowel sounds normal; no masses,  no organomegaly Extremities: extremities normal, atraumatic, no cyanosis or edema  ECOG PERFORMANCE  STATUS: 1 - Symptomatic but completely ambulatory  Blood pressure 129/74, pulse 60, temperature 97.8 F (36.6 C), temperature source Oral, resp. rate 18, weight 89 lb 3.2 oz (40.5 kg), SpO2 98 %.  LABORATORY DATA: Lab Results  Component Value Date   WBC 5.6 01/31/2022   HGB 12.5 01/31/2022   HCT 38.4 01/31/2022   MCV 94.1 01/31/2022   PLT 234 01/31/2022      Chemistry      Component Value Date/Time   NA 134 (L) 01/31/2022 0957   K 3.5 01/31/2022 0957   CL 97 (L) 01/31/2022 0957   CO2 34 (H) 01/31/2022 0957   BUN 19 01/31/2022 0957   CREATININE 0.99 01/31/2022 0957      Component Value Date/Time   CALCIUM 9.3 01/31/2022 0957   ALKPHOS 66 01/31/2022 0957   AST 32 01/31/2022  0957   ALT 21 01/31/2022 0957   BILITOT 0.3 01/31/2022 0957       RADIOGRAPHIC STUDIES: CT Chest W Contrast  Result Date: 01/09/2022 CLINICAL DATA:  Stage IV non-small cell right lung cancer. Assess treatment response. * Tracking Code: BO * EXAM: CT CHEST, ABDOMEN, AND PELVIS WITH CONTRAST TECHNIQUE: Multidetector CT imaging of the chest, abdomen and pelvis was performed following the standard protocol during bolus administration of intravenous contrast. RADIATION DOSE REDUCTION: This exam was performed according to the departmental dose-optimization program which includes automated exposure control, adjustment of the mA and/or kV according to patient size and/or use of iterative reconstruction technique. CONTRAST:  80m OMNIPAQUE IOHEXOL 300 MG/ML  SOLN COMPARISON:  11/07/2021 CT chest, abdomen and pelvis. FINDINGS: CT CHEST FINDINGS Cardiovascular: Normal heart size. No significant pericardial effusion/thickening. Three-vessel coronary atherosclerosis. Atherosclerotic thoracic aorta with stable dilated 4.2 cm ascending thoracic aorta. Normal caliber pulmonary arteries. No central pulmonary emboli. Mediastinum/Nodes: No discrete thyroid nodules. Unremarkable esophagus. No axillary adenopathy. No pathologically  enlarged mediastinal or hilar nodes. Lungs/Pleura: No pneumothorax. Mild centrilobular emphysema. Irregular solid apical right upper lobe 5.8 x 5.7 cm lung mass (series 2/image 9), previously 5.4 x 5.4 cm using similar measurement technique, mildly increased. Solid 0.7 cm satellite nodule in the posterior right upper lobe (series 6/image 32), increased from 0.4 cm. Trace loculated apical right pleural effusion is stable. No left pleural effusion. Solid 1.4 cm superior segment right lower lobe nodule along the major fissure (series 6/image 33), increased from 0.8 cm. New 0.3 cm tiny solid peripheral right lower lobe pulmonary nodule (series 6/image 66). No significant left pulmonary nodules. Musculoskeletal: Stable mixed lytic and sclerotic anterior right third rib lesion. Stable lytic upper sternal slightly expansile lesion. Stable predominantly sclerotic T8 and T12 vertebral lesions with severe pathologic fractures. No new focal osseous lesions in the chest. Mild thoracic spondylosis. CT ABDOMEN PELVIS FINDINGS Hepatobiliary: Posterior right liver hypodense 1.1 cm lesion (series 2/image 49), previously 1.1 cm, stable. Direct invasion of the posterior right liver by the enlarging right adrenal mass as detailed below. No additional liver lesions. Normal gallbladder with no radiopaque cholelithiasis. No biliary ductal dilatation. Pancreas: Normal, with no mass or duct dilation. Spleen: Normal size. No mass. Adrenals/Urinary Tract: Poorly marginated irregular 6.8 x 5.6 cm right adrenal mass with targetoid enhancement, directly invading the posterior right liver and superior right renal parenchyma, significantly increased from 4.6 x 3.5 cm. No discrete left adrenal nodule. No hydronephrosis. Subcentimeter hypodense interpolar bilateral renal cortical lesions are too small to characterize and are unchanged, for which no imaging follow-up is recommended. Normal bladder. Stomach/Bowel: Normal non-distended stomach. Normal  caliber small bowel with no small bowel wall thickening. Normal appendix. Oral contrast transits to the left colon. Moderate sigmoid diverticulosis with no large bowel wall thickening or significant pericolonic fat stranding. Vascular/Lymphatic: Atherosclerotic nonaneurysmal abdominal aorta. Patent portal, splenic and renal veins. Right hepatic vein encased and probably occluded by infiltrative right adrenal metastasis. Middle and left hepatic veins are patent. Newly enlarged 1.3 cm aortocaval node (series 2/image 57). Newly enlarged 1.1 cm left para-aortic node (series 2/image 61). No pelvic adenopathy. Reproductive: Grossly normal uterus.  No adnexal mass. Other: No pneumoperitoneum, ascites or focal fluid collection. Musculoskeletal: No aggressive appearing focal osseous lesions. Marked lumbar spondylosis. IMPRESSION: 1. Interval disease progression. 2. Apical right lung mass is mildly increased in size. Three new/enlarging right pulmonary metastases. 3. Substantial growth of large right adrenal metastasis, now directly invading the posterior right liver and  upper right kidney. 4. Stable separate small posterior right liver metastasis. 5. New metastatic adenopathy to the retroperitoneum. 6. Stable right third rib and T8 and T12 vertebral bone metastases. 7. Dilated 4.2 cm ascending thoracic aorta. Recommend annual imaging followup by CTA or MRA. This recommendation follows 2010 ACCF/AHA/AATS/ACR/ASA/SCA/SCAI/SIR/STS/SVM Guidelines for the Diagnosis and Management of Patients with Thoracic Aortic Disease. Circulation. 2010; 121: F027-X412. Aortic aneurysm NOS (ICD10-I71.9). 8. Aortic Atherosclerosis (ICD10-I70.0) and Emphysema (ICD10-J43.9). Electronically Signed   By: Ilona Sorrel M.D.   On: 01/09/2022 18:29   CT Abdomen Pelvis W Contrast  Result Date: 01/09/2022 CLINICAL DATA:  Stage IV non-small cell right lung cancer. Assess treatment response. * Tracking Code: BO * EXAM: CT CHEST, ABDOMEN, AND PELVIS WITH  CONTRAST TECHNIQUE: Multidetector CT imaging of the chest, abdomen and pelvis was performed following the standard protocol during bolus administration of intravenous contrast. RADIATION DOSE REDUCTION: This exam was performed according to the departmental dose-optimization program which includes automated exposure control, adjustment of the mA and/or kV according to patient size and/or use of iterative reconstruction technique. CONTRAST:  66m OMNIPAQUE IOHEXOL 300 MG/ML  SOLN COMPARISON:  11/07/2021 CT chest, abdomen and pelvis. FINDINGS: CT CHEST FINDINGS Cardiovascular: Normal heart size. No significant pericardial effusion/thickening. Three-vessel coronary atherosclerosis. Atherosclerotic thoracic aorta with stable dilated 4.2 cm ascending thoracic aorta. Normal caliber pulmonary arteries. No central pulmonary emboli. Mediastinum/Nodes: No discrete thyroid nodules. Unremarkable esophagus. No axillary adenopathy. No pathologically enlarged mediastinal or hilar nodes. Lungs/Pleura: No pneumothorax. Mild centrilobular emphysema. Irregular solid apical right upper lobe 5.8 x 5.7 cm lung mass (series 2/image 9), previously 5.4 x 5.4 cm using similar measurement technique, mildly increased. Solid 0.7 cm satellite nodule in the posterior right upper lobe (series 6/image 32), increased from 0.4 cm. Trace loculated apical right pleural effusion is stable. No left pleural effusion. Solid 1.4 cm superior segment right lower lobe nodule along the major fissure (series 6/image 33), increased from 0.8 cm. New 0.3 cm tiny solid peripheral right lower lobe pulmonary nodule (series 6/image 66). No significant left pulmonary nodules. Musculoskeletal: Stable mixed lytic and sclerotic anterior right third rib lesion. Stable lytic upper sternal slightly expansile lesion. Stable predominantly sclerotic T8 and T12 vertebral lesions with severe pathologic fractures. No new focal osseous lesions in the chest. Mild thoracic spondylosis.  CT ABDOMEN PELVIS FINDINGS Hepatobiliary: Posterior right liver hypodense 1.1 cm lesion (series 2/image 49), previously 1.1 cm, stable. Direct invasion of the posterior right liver by the enlarging right adrenal mass as detailed below. No additional liver lesions. Normal gallbladder with no radiopaque cholelithiasis. No biliary ductal dilatation. Pancreas: Normal, with no mass or duct dilation. Spleen: Normal size. No mass. Adrenals/Urinary Tract: Poorly marginated irregular 6.8 x 5.6 cm right adrenal mass with targetoid enhancement, directly invading the posterior right liver and superior right renal parenchyma, significantly increased from 4.6 x 3.5 cm. No discrete left adrenal nodule. No hydronephrosis. Subcentimeter hypodense interpolar bilateral renal cortical lesions are too small to characterize and are unchanged, for which no imaging follow-up is recommended. Normal bladder. Stomach/Bowel: Normal non-distended stomach. Normal caliber small bowel with no small bowel wall thickening. Normal appendix. Oral contrast transits to the left colon. Moderate sigmoid diverticulosis with no large bowel wall thickening or significant pericolonic fat stranding. Vascular/Lymphatic: Atherosclerotic nonaneurysmal abdominal aorta. Patent portal, splenic and renal veins. Right hepatic vein encased and probably occluded by infiltrative right adrenal metastasis. Middle and left hepatic veins are patent. Newly enlarged 1.3 cm aortocaval node (series 2/image 57). Newly enlarged  1.1 cm left para-aortic node (series 2/image 61). No pelvic adenopathy. Reproductive: Grossly normal uterus.  No adnexal mass. Other: No pneumoperitoneum, ascites or focal fluid collection. Musculoskeletal: No aggressive appearing focal osseous lesions. Marked lumbar spondylosis. IMPRESSION: 1. Interval disease progression. 2. Apical right lung mass is mildly increased in size. Three new/enlarging right pulmonary metastases. 3. Substantial growth of large  right adrenal metastasis, now directly invading the posterior right liver and upper right kidney. 4. Stable separate small posterior right liver metastasis. 5. New metastatic adenopathy to the retroperitoneum. 6. Stable right third rib and T8 and T12 vertebral bone metastases. 7. Dilated 4.2 cm ascending thoracic aorta. Recommend annual imaging followup by CTA or MRA. This recommendation follows 2010 ACCF/AHA/AATS/ACR/ASA/SCA/SCAI/SIR/STS/SVM Guidelines for the Diagnosis and Management of Patients with Thoracic Aortic Disease. Circulation. 2010; 121: M250-I370. Aortic aneurysm NOS (ICD10-I71.9). 8. Aortic Atherosclerosis (ICD10-I70.0) and Emphysema (ICD10-J43.9). Electronically Signed   By: Ilona Sorrel M.D.   On: 01/09/2022 18:29    ASSESSMENT AND PLAN: This is a very pleasant 76 years old white female diagnosed with Stage IV (T3, N2, M1 C) non-small cell lung cancer with unknown histologic subtype, likely adenocarcinoma with positive KRAS G12C mutation.  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  Status post palliative radiotherapy to the painful bone lesions under the care of Dr. Sondra Come.  The patient started induction systemic chemotherapy with carboplatin for AUC of 5, Alimta 500 Mg/M2 and Keytruda 200 Mg IV every 3 weeks and currently on maintenance treatment with single agent Alimta after Keytruda was discontinued starting from cycle #7 because of significant arthralgia and myalgia.  She is status post 18 cycles of total treatment.  The patient has been tolerating this treatment well except for fatigue. She underwent palliative radiotherapy to enlarging pulmonary nodule under the care of Dr. Sondra Come. She had repeat CT scan of the chest, abdomen and pelvis performed recently.  I personally and independently reviewed the scan images and discussed the  results with the patient and her husband and showed them the images. Unfortunately her scan showed evidence for disease progression with increase in the size of the apical right lung mass in addition to new and enlarging pulmonary metastasis as well as significant increase in the large right adrenal metastasis and new metastatic adenopathy to the retroperitoneum. The patient is started treatment with Alfred Levins (Adagrasib) 600 mg p.o. twice daily on January 17, 2022.  The patient has been tolerating this treatment well with no concerning adverse effect except for 1 or 2 episodes of diarrhea. Her blood work today is unremarkable for any concerning abnormalities and her hemoglobin and hematocrit are better. I recommended for her to continue her current treatment with Alfred Levins (Adagrasib) with the same dose. For pain management she will continue her current treatment with MS Contin and Dilaudid on as-needed basis. For the lack of appetite she will continue on Marinol.  She is also meeting with the dietitian at the cancer center later today. For the metastatic bone disease, the patient will continue her current treatment with Zometa. She was advised to call immediately if she has any other concerning symptoms in the interval. The patient voices understanding of current disease status and treatment options and is in agreement with the current care plan.  All questions were answered. The patient knows to call the clinic with any problems, questions or  concerns. We can certainly see the patient much sooner if necessary.  The total time spent in the appointment was 20 minutes.  Disclaimer: This note was dictated with voice recognition software. Similar sounding words can inadvertently be transcribed and may not be corrected upon review.

## 2022-01-31 NOTE — Progress Notes (Signed)
Met with patient and husband regarding nutrition strategies for stage IV non-small cell lung cancer.  CURRENT THERAPY: 1) Krazati (Adagrasib) 600 Mg p.o. twice daily.  First dose started January 17, 2022 2) Zometa every 6 weeks starting on 02/10/21   Weight stable and documented as 89 pounds 3.2 ounces. Patient reports she is beginning to have an appetite again. She wants to increase her physical activity. She and her husband would like to know foods to add to her diet. She currently denies nutrition impact symptoms from oral medications.  Nutrition diagnosis: Severe malnutrition, ongoing.  Intervention: Reviewed strategies for adding calories and protein in 3 meals and at least 2 snacks daily. Provided examples of appropriate snacks and stressed importance of consuming foods patient enjoys but also are sufficient for calorie and protein needs. Recommended Ensure Plus or equivalent twice daily and provided additional samples. Provided nutrition facts sheets on improving appetite, increasing calories and protein, high-protein foods, and high-protein high-calorie snacks. Questions were answered.  Teach back method used.  Contact information provided.  Monitoring, evaluation, goals: Patient will tolerate increased oral intake along with increased activity to improve quality of life.  No follow-up has been scheduled at this time.  Patient has my contact information for questions or concerns.  **Disclaimer: This note was dictated with voice recognition software. Similar sounding words can inadvertently be transcribed and this note may contain transcription errors which may not have been corrected upon publication of note.**

## 2022-02-06 DIAGNOSIS — C3491 Malignant neoplasm of unspecified part of right bronchus or lung: Secondary | ICD-10-CM | POA: Diagnosis not present

## 2022-02-09 ENCOUNTER — Telehealth: Payer: Self-pay

## 2022-02-09 NOTE — Telephone Encounter (Signed)
Pt husband called regarding his schedule, confirmed schedule with him. Pt husband also had questions regarding funding for pt drug, call forwarded to provider's desk RN for further assistance.

## 2022-02-09 NOTE — Telephone Encounter (Signed)
Call received from pts husband, [name], advising that pt received a sample of Alfred Levins and has a week worth of medicine left but they have not heard back from the drug assistance program and are concerned they will not have a timely coverage of a refill. He request a message be sent to the pharmacy for assistance. I have advised Mr. Mcquigg I will forward his concerns as requested.

## 2022-02-09 NOTE — Telephone Encounter (Signed)
I have spoken with the pts husband and advised as indicated. He expressed understanding of this information.

## 2022-02-12 DIAGNOSIS — C3491 Malignant neoplasm of unspecified part of right bronchus or lung: Secondary | ICD-10-CM | POA: Diagnosis not present

## 2022-02-13 ENCOUNTER — Telehealth: Payer: Self-pay | Admitting: Internal Medicine

## 2022-02-13 NOTE — Telephone Encounter (Signed)
Called patient regarding upcoming appointments, patient is notified. 

## 2022-02-14 ENCOUNTER — Inpatient Hospital Stay: Payer: Medicare HMO

## 2022-02-14 ENCOUNTER — Other Ambulatory Visit: Payer: Self-pay

## 2022-02-14 ENCOUNTER — Inpatient Hospital Stay (HOSPITAL_BASED_OUTPATIENT_CLINIC_OR_DEPARTMENT_OTHER): Payer: Medicare HMO | Admitting: Internal Medicine

## 2022-02-14 ENCOUNTER — Encounter: Payer: Self-pay | Admitting: Internal Medicine

## 2022-02-14 ENCOUNTER — Telehealth: Payer: Self-pay | Admitting: Medical Oncology

## 2022-02-14 VITALS — BP 151/60 | HR 55 | Temp 98.0°F | Resp 15 | Wt 87.8 lb

## 2022-02-14 DIAGNOSIS — C787 Secondary malignant neoplasm of liver and intrahepatic bile duct: Secondary | ICD-10-CM | POA: Diagnosis not present

## 2022-02-14 DIAGNOSIS — C7971 Secondary malignant neoplasm of right adrenal gland: Secondary | ICD-10-CM | POA: Diagnosis not present

## 2022-02-14 DIAGNOSIS — C3490 Malignant neoplasm of unspecified part of unspecified bronchus or lung: Secondary | ICD-10-CM

## 2022-02-14 DIAGNOSIS — I10 Essential (primary) hypertension: Secondary | ICD-10-CM | POA: Diagnosis not present

## 2022-02-14 DIAGNOSIS — Z923 Personal history of irradiation: Secondary | ICD-10-CM | POA: Diagnosis not present

## 2022-02-14 DIAGNOSIS — C3411 Malignant neoplasm of upper lobe, right bronchus or lung: Secondary | ICD-10-CM | POA: Diagnosis not present

## 2022-02-14 DIAGNOSIS — C3491 Malignant neoplasm of unspecified part of right bronchus or lung: Secondary | ICD-10-CM

## 2022-02-14 DIAGNOSIS — C7951 Secondary malignant neoplasm of bone: Secondary | ICD-10-CM | POA: Diagnosis not present

## 2022-02-14 DIAGNOSIS — J90 Pleural effusion, not elsewhere classified: Secondary | ICD-10-CM | POA: Diagnosis not present

## 2022-02-14 LAB — CBC WITH DIFFERENTIAL (CANCER CENTER ONLY)
Abs Immature Granulocytes: 0.04 10*3/uL (ref 0.00–0.07)
Basophils Absolute: 0 10*3/uL (ref 0.0–0.1)
Basophils Relative: 1 %
Eosinophils Absolute: 0.2 10*3/uL (ref 0.0–0.5)
Eosinophils Relative: 3 %
HCT: 39 % (ref 36.0–46.0)
Hemoglobin: 12.8 g/dL (ref 12.0–15.0)
Immature Granulocytes: 1 %
Lymphocytes Relative: 25 %
Lymphs Abs: 1.3 10*3/uL (ref 0.7–4.0)
MCH: 30.8 pg (ref 26.0–34.0)
MCHC: 32.8 g/dL (ref 30.0–36.0)
MCV: 93.8 fL (ref 80.0–100.0)
Monocytes Absolute: 0.9 10*3/uL (ref 0.1–1.0)
Monocytes Relative: 17 %
Neutro Abs: 2.8 10*3/uL (ref 1.7–7.7)
Neutrophils Relative %: 53 %
Platelet Count: 179 10*3/uL (ref 150–400)
RBC: 4.16 MIL/uL (ref 3.87–5.11)
RDW: 13.9 % (ref 11.5–15.5)
WBC Count: 5.3 10*3/uL (ref 4.0–10.5)
nRBC: 0 % (ref 0.0–0.2)

## 2022-02-14 LAB — CMP (CANCER CENTER ONLY)
ALT: 15 U/L (ref 0–44)
AST: 24 U/L (ref 15–41)
Albumin: 4 g/dL (ref 3.5–5.0)
Alkaline Phosphatase: 64 U/L (ref 38–126)
Anion gap: 4 — ABNORMAL LOW (ref 5–15)
BUN: 17 mg/dL (ref 8–23)
CO2: 32 mmol/L (ref 22–32)
Calcium: 9 mg/dL (ref 8.9–10.3)
Chloride: 99 mmol/L (ref 98–111)
Creatinine: 0.92 mg/dL (ref 0.44–1.00)
GFR, Estimated: >60 mL/min (ref >60)
Glucose, Bld: 110 mg/dL — ABNORMAL HIGH (ref 70–99)
Potassium: 3.8 mmol/L (ref 3.5–5.1)
Sodium: 135 mmol/L (ref 135–145)
Total Bilirubin: 0.5 mg/dL (ref 0.3–1.2)
Total Protein: 7.4 g/dL (ref 6.5–8.1)

## 2022-02-14 NOTE — Telephone Encounter (Signed)
Dr Denton Brick sending fax order for Magnesium to be run today  on blood collected earlier.

## 2022-02-14 NOTE — Progress Notes (Signed)
[facility] [phone]   [phone]  OFFICE PROGRESS NOTE  [name], NP [address] [place] [address]  DIAGNOSIS: Stage IV (T3, N2, M1 C) non-small cell lung cancer with unknown histologic subtype.  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   DETECTED ALTERATION(S) / BIOMARKER(S)      % CFDNA OR AMPLIFICATION        ASSOCIATED FDA-APPROVED THERAPIES         CLINICAL TRIAL AVAILABILITY [id number] 8.9%   Sotorasib Yes [id number] 7.2% None     Yes     PRIOR THERAPY:   1) Palliative radiotherapy to the painful bone lesions under the care of Dr. Sondra Come.  2) Palliative systemic chemotherapy with carboplatin for AUC of 5, Alimta 500 Mg/M2 and Keytruda 200 Mg IV every 3 weeks.  First dose of treatment on December 29, 2020. Status post 18  cycles.  Starting from cycle #5, the patient is going to start maintenance Alimta and Keytruda. Keytruda discontinued from cycle #7 due to myalgias.  Last cycle was given on December 20, 2021 discontinued secondary to disease progression. 3) Possible palliative radiation to the enlarging pulmonary mass under the care of Dr. Sondra Come.   CURRENT THERAPY: 1) Krazati (Adagrasib) 600 Mg p.o. twice daily.  First dose started January 17, 2022 2) Zometa every 6 weeks starting on 02/10/21   INTERVAL HISTORY: Kristi Jennings 76 y.o. female returns to the clinic today for follow-up visit accompanied by her husband the patient continues to complain of increasing fatigue and she fall asleep a lot.  She is currently on MS Contin 30 mg p.o. twice daily for her pain management but the patient does not have a lot of pain.  She will discuss with her palliative care team to reduce the dose of MS Contin to probably 15 mg p.o. twice daily.  She is also on  Marinol and its not helping her appetite much.  She denied having any chest pain, shortness of breath, cough or hemoptysis.  She has no nausea, vomiting, diarrhea or constipation.  She has no headache or visual changes.  She is here today for evaluation and repeat blood work.  She continues to tolerate her treatment with Alfred Levins (Adagrasib) fairly well.  MEDICAL HISTORY: Past Medical History:  Diagnosis Date   Borderline diabetes mellitus    Bronchogenic cancer of right lung ([place])    Dyslipidemia    History of radiation therapy    right chest 12/21/2020-12/24/2020  Dr Gery Pray   History of radiation therapy    right lung 06/07/2021-06/17/2021  Dr Gery Pray   Hypertension    Polymyalgia rheumatica ([facility])     ALLERGIES:  is allergic to shellfish-derived products and augmentin [amoxicillin-pot clavulanate].  MEDICATIONS:  Current Outpatient Medications  Medication Sig Dispense Refill   acetaminophen (TYLENOL) 650 MG CR tablet Take 1,300 mg by mouth 3 (three) times daily as needed for pain.     adagrasib (KRAZATI) 200 MG tablet Take 3 tablets (600 mg total) by mouth 2 (two) times daily. 180 tablet 0   aspirin EC 81 MG tablet Take 81 mg by mouth 4 (four) times a week. Swallow whole. (Patient not taking: Reported on 11/09/2021)     atenolol (TENORMIN) 25 MG tablet Take 12.5 mg by  mouth at bedtime.     Calcium-Vitamin D-Vitamin K (CALCIUM + D) [phone] MG-UNT-MCG CHEW      Cholecalciferol (VITAMIN D) 50 MCG (2000 UT) tablet      diclofenac Sodium (VOLTAREN) 1 % GEL Apply 4 g topically 4 (four) times daily. (Patient taking differently: Apply 4 g topically 4 (four) times daily as needed (pain).) 150 g 0   dronabinol (MARINOL) 5 MG capsule Take 1 capsule (5 mg total) by mouth 2 (two) times daily before a meal. 60 capsule 1   enoxaparin (LOVENOX) 60 MG/0.6ML injection Inject 0.6 mLs (60 mg total) into the skin daily. 18 mL 2   escitalopram (LEXAPRO) 20 MG tablet Take 1 tablet (20 mg total) by  mouth at bedtime. 90 tablet 0   fexofenadine (ALLEGRA) 180 MG tablet Take 180 mg by mouth daily.     fluocinonide (LIDEX) 0.05 % external solution Apply topically daily.     folic acid (FOLVITE) 1 MG tablet TAKE 1 TABLET BY MOUTH ONE TIME DAILY 30 tablet 2   HYDROmorphone (DILAUDID) 4 MG tablet Take 4 mg by mouth every 6 (six) hours as needed for severe pain. Quantity of 120 tablets     lidocaine (LIDODERM) 5 % Place 1 patch onto the skin daily. Remove & Discard patch within 12 hours or as directed by MD (Patient not taking: Reported on 11/09/2021) 30 patch 0   mirtazapine (REMERON) 15 MG tablet Take 15 mg by mouth at bedtime.     morphine (MS CONTIN) 30 MG 12 hr tablet Take 1 tablet (30 mg total) by mouth every 12 (twelve) hours. 60 tablet 0   Multiple Vitamin (MULTI VITAMIN) TABS      ondansetron (ZOFRAN-ODT) 4 MG disintegrating tablet Take 1 tablet (4 mg total) by mouth 2 (two) times daily as needed for nausea or vomiting. (Patient not taking: Reported on 11/30/2021) 20 tablet 1   OVER THE COUNTER MEDICATION Take 2 Cans by mouth 2 (two) times daily as needed (for nutrition). Premier protein     predniSONE (DELTASONE) 1 MG tablet Take 3 mg by mouth daily.     predniSONE (DELTASONE) 5 MG tablet Take 5 mg by mouth daily.     Salicylic Acid (SCALPICIN EX) Apply 1 application. topically daily as needed.     senna-docusate (SENOKOT-S) 8.6-50 MG tablet Take 1 tablet by mouth 2 (two) times daily. 30 tablet 2   triamcinolone ointment (KENALOG) 0.1 % Apply 1 application. topically 2 (two) times daily.     No current facility-administered medications for this visit.    SURGICAL HISTORY: No past surgical history on file.  REVIEW OF SYSTEMS:  Constitutional: positive for anorexia, fatigue, and weight loss Eyes: negative Ears, nose, mouth, throat, and face: negative Respiratory: negative Cardiovascular: negative Gastrointestinal: negative Genitourinary:negative Integument/breast:  negative Hematologic/lymphatic: negative Musculoskeletal:negative Neurological: negative Behavioral/Psych: negative Endocrine: negative Allergic/Immunologic: negative   PHYSICAL EXAMINATION: General appearance: alert, cooperative, fatigued, and no distress Head: Normocephalic, without obvious abnormality, atraumatic Neck: no adenopathy, no JVD, supple, symmetrical, trachea midline, and thyroid not enlarged, symmetric, no tenderness/mass/nodules Lymph nodes: Cervical, supraclavicular, and axillary nodes normal. Resp: clear to auscultation bilaterally Back: symmetric, no curvature. ROM normal. No CVA tenderness. Cardio: regular rate and rhythm, S1, S2 normal, no murmur, click, rub or gallop GI: soft, non-tender; bowel sounds normal; no masses,  no organomegaly Extremities: extremities normal, atraumatic, no cyanosis or edema Neurologic: Alert and oriented X 3, normal strength and tone. Normal symmetric reflexes. Normal coordination and gait  ECOG  PERFORMANCE STATUS: 1 - Symptomatic but completely ambulatory  Blood pressure (!) 151/60, pulse (!) 55, temperature 98 F (36.7 C), temperature source Oral, resp. rate 15, weight 87 lb 12.8 oz (39.8 kg), SpO2 100 %.  LABORATORY DATA: Lab Results  Component Value Date   WBC 5.6 01/31/2022   HGB 12.5 01/31/2022   HCT 38.4 01/31/2022   MCV 94.1 01/31/2022   PLT 234 01/31/2022      Chemistry      Component Value Date/Time   NA 134 (L) 01/31/2022 0957   K 3.5 01/31/2022 0957   CL 97 (L) 01/31/2022 0957   CO2 34 (H) 01/31/2022 0957   BUN 19 01/31/2022 0957   CREATININE 0.99 01/31/2022 0957      Component Value Date/Time   CALCIUM 9.3 01/31/2022 0957   ALKPHOS 66 01/31/2022 0957   AST 32 01/31/2022 0957   ALT 21 01/31/2022 0957   BILITOT 0.3 01/31/2022 0957       RADIOGRAPHIC STUDIES: No results found.  ASSESSMENT AND PLAN: This is a very pleasant 76 years old white female diagnosed with Stage IV (T3, N2, M1 C) non-small cell  lung cancer with unknown histologic subtype, likely adenocarcinoma with positive KRAS G12C mutation.  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  Status post palliative radiotherapy to the painful bone lesions under the care of Dr. Sondra Come.  The patient started induction systemic chemotherapy with carboplatin for AUC of 5, Alimta 500 Mg/M2 and Keytruda 200 Mg IV every 3 weeks and currently on maintenance treatment with single agent Alimta after Keytruda was discontinued starting from cycle #7 because of significant arthralgia and myalgia.  She is status post 18 cycles of total treatment.  The patient has been tolerating this treatment well except for fatigue. She underwent palliative radiotherapy to enlarging pulmonary nodule under the care of Dr. Sondra Come. She had repeat CT scan of the chest, abdomen and pelvis performed recently.  I personally and independently reviewed the scan images and discussed the results with the patient and her husband and showed them the images. Unfortunately her scan showed evidence for disease progression with increase in the size of the apical right lung mass in addition to new and enlarging pulmonary metastasis as well as significant increase in the large right adrenal metastasis and new metastatic adenopathy to the retroperitoneum. The patient is started treatment with Alfred Levins (Adagrasib) 600 mg p.o. twice daily on January 17, 2022.   The patient has been tolerating this treatment fairly well except for fatigue.  I had a lengthy discussion with the patient and her husband about her condition. I gave her the option of reducing the dose of Krazati (Adagrasib) to 400 mg p.o. twice daily because of the fatigue but they would like to continue with the current dose for now until the next scan which is expected to be done in 1  months. For the pain management she is currently on MS Contin 30 mg p.o. twice daily.  The patient does not have a lot of pain and she may benefit from reducing her dose to 15 mg p.o. twice daily because of her increasing fatigue and sleepiness. For the lack of appetite she is currently on Marinol. For the metastatic bone disease she will continue her current treatment with Zometa. The patient was advised to call immediately if she has any  other concerning symptoms in the interval. The patient voices understanding of current disease status and treatment options and is in agreement with the current care plan.  All questions were answered. The patient knows to call the clinic with any problems, questions or concerns. We can certainly see the patient much sooner if necessary.  The total time spent in the appointment was 30 minutes.  Disclaimer: This note was dictated with voice recognition software. Similar sounding words can inadvertently be transcribed and may not be corrected upon review.

## 2022-02-16 ENCOUNTER — Other Ambulatory Visit: Payer: Self-pay | Admitting: Physician Assistant

## 2022-02-16 DIAGNOSIS — I10 Essential (primary) hypertension: Secondary | ICD-10-CM | POA: Diagnosis not present

## 2022-02-16 DIAGNOSIS — R69 Illness, unspecified: Secondary | ICD-10-CM | POA: Diagnosis not present

## 2022-02-16 DIAGNOSIS — C3490 Malignant neoplasm of unspecified part of unspecified bronchus or lung: Secondary | ICD-10-CM | POA: Diagnosis not present

## 2022-02-16 DIAGNOSIS — C3491 Malignant neoplasm of unspecified part of right bronchus or lung: Secondary | ICD-10-CM

## 2022-02-16 DIAGNOSIS — N390 Urinary tract infection, site not specified: Secondary | ICD-10-CM | POA: Diagnosis not present

## 2022-02-16 DIAGNOSIS — I824Y1 Acute embolism and thrombosis of unspecified deep veins of right proximal lower extremity: Secondary | ICD-10-CM

## 2022-02-17 ENCOUNTER — Other Ambulatory Visit: Payer: Self-pay

## 2022-02-17 DIAGNOSIS — C7951 Secondary malignant neoplasm of bone: Secondary | ICD-10-CM

## 2022-02-17 DIAGNOSIS — C3491 Malignant neoplasm of unspecified part of right bronchus or lung: Secondary | ICD-10-CM

## 2022-02-17 MED ORDER — MORPHINE SULFATE ER 30 MG PO TBCR: 30.0000 mg | EXTENDED_RELEASE_TABLET | Freq: Two times a day (BID) | ORAL | 0 refills | Status: DC

## 2022-02-18 DIAGNOSIS — J449 Chronic obstructive pulmonary disease, unspecified: Secondary | ICD-10-CM | POA: Diagnosis not present

## 2022-02-18 DIAGNOSIS — I4819 Other persistent atrial fibrillation: Secondary | ICD-10-CM | POA: Diagnosis not present

## 2022-02-21 ENCOUNTER — Ambulatory Visit: Payer: Medicare HMO

## 2022-02-21 ENCOUNTER — Other Ambulatory Visit: Payer: Medicare HMO

## 2022-02-21 ENCOUNTER — Ambulatory Visit: Payer: Medicare HMO | Admitting: Physician Assistant

## 2022-02-21 ENCOUNTER — Encounter: Payer: Self-pay | Admitting: Internal Medicine

## 2022-02-22 ENCOUNTER — Telehealth: Payer: Self-pay | Admitting: *Deleted

## 2022-02-22 ENCOUNTER — Encounter: Payer: Self-pay | Admitting: *Deleted

## 2022-02-22 NOTE — Patient Outreach (Signed)
  Care Coordination   Initial Visit Note   02/22/2022 Name: Kristi Jennings MRN: [id number] DOB: June 15, 1945  Kristi Jennings is a 76 y.o. year old female who sees Kristi Saver, NP for primary care. I spoke with  Kristi Jennings by phone today.  What matters to the patients health and wellness today?  "Things are going very well for me and I am really doing good, not having any problems; I am taking my medicine like I am supposed to and nothing has changed recently.  We have already gotten our flu shots for this year so we are ahead of the game; I really can't talk anymore, I have a guest here at my house, and things are going great, so you don't need to call me back"    SDOH assessments and interventions completed:  No  Care Coordination Interventions Activated:  No  Care Coordination Interventions:  No, not indicated   Follow up plan: No further intervention required.   Encounter Outcome:  Pt. Visit Completed   Kristi Rack, RN, BSN, CCRN Alumnus RN CM Care Coordination/ [facility] [phone]: direct office

## 2022-03-08 DIAGNOSIS — C3491 Malignant neoplasm of unspecified part of right bronchus or lung: Secondary | ICD-10-CM | POA: Diagnosis not present

## 2022-03-10 ENCOUNTER — Telehealth: Payer: Self-pay | Admitting: Medical Oncology

## 2022-03-10 NOTE — Telephone Encounter (Signed)
Pt will get labs done Monday @ [facility].

## 2022-03-13 ENCOUNTER — Inpatient Hospital Stay: Payer: Medicare HMO | Attending: Nurse Practitioner

## 2022-03-13 ENCOUNTER — Other Ambulatory Visit: Payer: Medicare HMO

## 2022-03-13 DIAGNOSIS — E119 Type 2 diabetes mellitus without complications: Secondary | ICD-10-CM | POA: Diagnosis not present

## 2022-03-13 DIAGNOSIS — R197 Diarrhea, unspecified: Secondary | ICD-10-CM | POA: Insufficient documentation

## 2022-03-13 DIAGNOSIS — I10 Essential (primary) hypertension: Secondary | ICD-10-CM | POA: Insufficient documentation

## 2022-03-13 DIAGNOSIS — C3411 Malignant neoplasm of upper lobe, right bronchus or lung: Secondary | ICD-10-CM | POA: Insufficient documentation

## 2022-03-13 DIAGNOSIS — R5383 Other fatigue: Secondary | ICD-10-CM | POA: Insufficient documentation

## 2022-03-13 DIAGNOSIS — C787 Secondary malignant neoplasm of liver and intrahepatic bile duct: Secondary | ICD-10-CM | POA: Diagnosis not present

## 2022-03-13 DIAGNOSIS — C3490 Malignant neoplasm of unspecified part of unspecified bronchus or lung: Secondary | ICD-10-CM

## 2022-03-13 DIAGNOSIS — C7951 Secondary malignant neoplasm of bone: Secondary | ICD-10-CM | POA: Diagnosis not present

## 2022-03-13 DIAGNOSIS — C7971 Secondary malignant neoplasm of right adrenal gland: Secondary | ICD-10-CM | POA: Insufficient documentation

## 2022-03-13 LAB — CBC WITH DIFFERENTIAL (CANCER CENTER ONLY)
Abs Immature Granulocytes: 0.08 10*3/uL — ABNORMAL HIGH (ref 0.00–0.07)
Basophils Absolute: 0 10*3/uL (ref 0.0–0.1)
Basophils Relative: 0 %
Eosinophils Absolute: 0.1 10*3/uL (ref 0.0–0.5)
Eosinophils Relative: 2 %
HCT: 39.6 % (ref 36.0–46.0)
Hemoglobin: 12.9 g/dL (ref 12.0–15.0)
Immature Granulocytes: 1 %
Lymphocytes Relative: 15 %
Lymphs Abs: 1.3 10*3/uL (ref 0.7–4.0)
MCH: 29.4 pg (ref 26.0–34.0)
MCHC: 32.6 g/dL (ref 30.0–36.0)
MCV: 90.2 fL (ref 80.0–100.0)
Monocytes Absolute: 1.3 10*3/uL — ABNORMAL HIGH (ref 0.1–1.0)
Monocytes Relative: 15 %
Neutro Abs: 5.8 10*3/uL (ref 1.7–7.7)
Neutrophils Relative %: 67 %
Platelet Count: 201 10*3/uL (ref 150–400)
RBC: 4.39 MIL/uL (ref 3.87–5.11)
RDW: 13.9 % (ref 11.5–15.5)
WBC Count: 8.7 10*3/uL (ref 4.0–10.5)
nRBC: 0 % (ref 0.0–0.2)

## 2022-03-13 LAB — CMP (CANCER CENTER ONLY)
ALT: 17 U/L (ref 0–44)
AST: 25 U/L (ref 15–41)
Albumin: 4.3 g/dL (ref 3.5–5.0)
Alkaline Phosphatase: 63 U/L (ref 38–126)
Anion gap: 9 (ref 5–15)
BUN: 21 mg/dL (ref 8–23)
CO2: 30 mmol/L (ref 22–32)
Calcium: 10.3 mg/dL (ref 8.9–10.3)
Chloride: 100 mmol/L (ref 98–111)
Creatinine: 1.14 mg/dL — ABNORMAL HIGH (ref 0.44–1.00)
GFR, Estimated: 50 mL/min — ABNORMAL LOW (ref >60)
Glucose, Bld: 112 mg/dL — ABNORMAL HIGH (ref 70–99)
Potassium: 5.5 mmol/L — ABNORMAL HIGH (ref 3.5–5.1)
Sodium: 139 mmol/L (ref 135–145)
Total Bilirubin: 0.5 mg/dL (ref 0.3–1.2)
Total Protein: 7.4 g/dL (ref 6.5–8.1)

## 2022-03-14 ENCOUNTER — Other Ambulatory Visit: Payer: Medicare HMO

## 2022-03-14 ENCOUNTER — Ambulatory Visit (HOSPITAL_COMMUNITY)
Admission: RE | Admit: 2022-03-14 | Discharge: 2022-03-14 | Disposition: A | Discharge status: Home / Self Care | Payer: Medicare HMO | Source: Ambulatory Visit | Attending: Internal Medicine | Admitting: Internal Medicine

## 2022-03-14 ENCOUNTER — Encounter (HOSPITAL_COMMUNITY): Payer: Self-pay

## 2022-03-14 DIAGNOSIS — C3490 Malignant neoplasm of unspecified part of unspecified bronchus or lung: Secondary | ICD-10-CM | POA: Insufficient documentation

## 2022-03-14 DIAGNOSIS — E279 Disorder of adrenal gland, unspecified: Secondary | ICD-10-CM | POA: Diagnosis not present

## 2022-03-14 DIAGNOSIS — R918 Other nonspecific abnormal finding of lung field: Secondary | ICD-10-CM | POA: Diagnosis not present

## 2022-03-14 DIAGNOSIS — C3491 Malignant neoplasm of unspecified part of right bronchus or lung: Secondary | ICD-10-CM | POA: Diagnosis not present

## 2022-03-14 MED ORDER — IOHEXOL 300 MG/ML  SOLN
75.0000 mL | Freq: Once | INTRAMUSCULAR | Status: AC | PRN
  Administered 2022-03-14: 75 mL via INTRAVENOUS

## 2022-03-14 MED ORDER — SODIUM CHLORIDE (PF) 0.9 % IJ SOLN
INTRAMUSCULAR | Status: AC
  Filled 2022-03-14: qty 50

## 2022-03-15 IMAGING — DX DG CHEST 1V PORT
1 series · 1 of 1 positions shown · non-contrast
Comparison: Chest radiograph 01/01/2021, CT chest 01/01/2021

CLINICAL DATA: Community acquired pneumonia, shortness of breath

EXAM:
PORTABLE CHEST 1 VIEW

[chest ap]
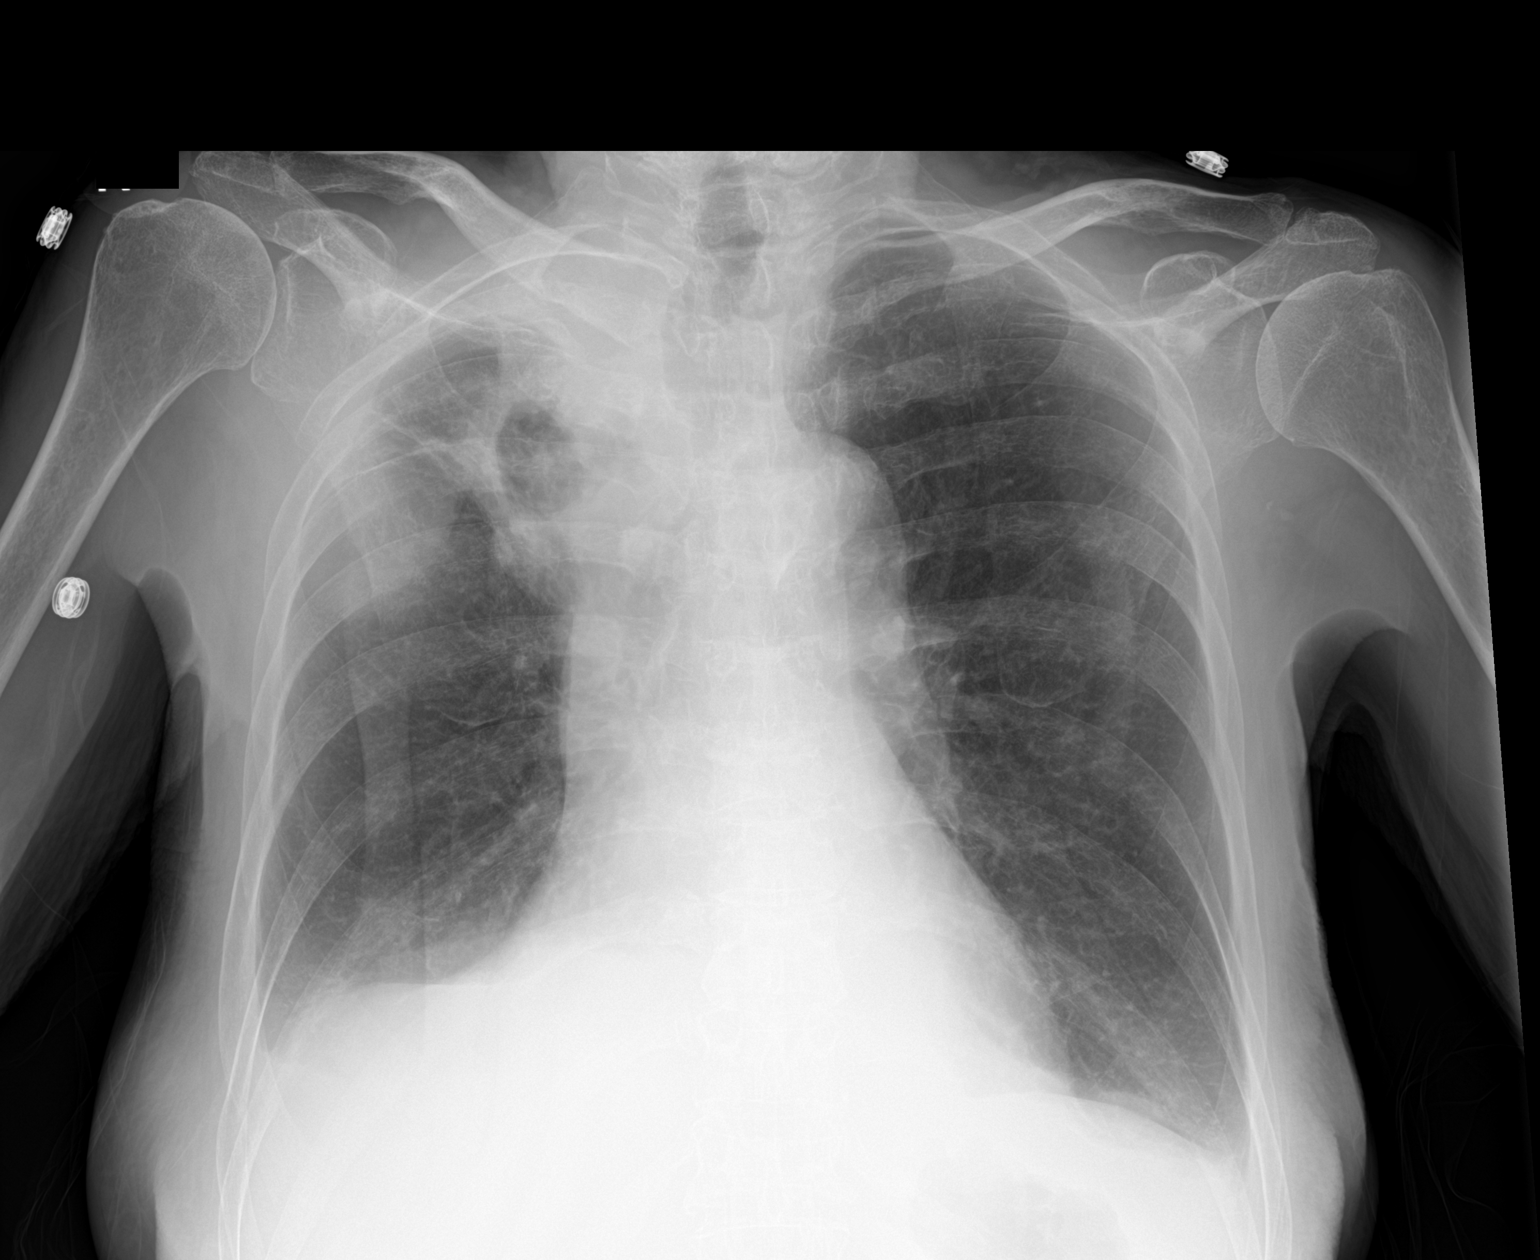

[1 of 1 positions shown; findings below may reference images not displayed]

FINDINGS: The cardiomediastinal silhouette is stable.

Confluent opacity in the right apex with central lucency is similar
to the prior study, consistent with the consolidation and cavitary
lesion seen on the prior CT chest. A small right pleural effusion is
slightly increased in size with slightly worsened aeration of the
right base. There is a small left pleural effusion with adjacent
airspace disease likely reflecting subsegmental atelectasis, new in
the interim. The left lung is otherwise clear.

The bones are stable.
IMPRESSION: 1. Slight interval increase in size of a small right pleural
effusion and new trace left pleural effusion with slightly worsened
aeration in the lung bases.
2. Grossly similar appearance of the cavitary mass and surrounding
opacity in the right upper lobe.

## 2022-03-16 ENCOUNTER — Inpatient Hospital Stay: Payer: Medicare HMO | Admitting: Nurse Practitioner

## 2022-03-16 ENCOUNTER — Other Ambulatory Visit: Payer: Self-pay

## 2022-03-16 ENCOUNTER — Inpatient Hospital Stay: Payer: Medicare HMO | Admitting: Internal Medicine

## 2022-03-16 VITALS — BP 108/58 | HR 78 | Temp 98.2°F | Resp 16 | Ht 66.0 in | Wt 89.2 lb

## 2022-03-16 DIAGNOSIS — C7971 Secondary malignant neoplasm of right adrenal gland: Secondary | ICD-10-CM | POA: Diagnosis not present

## 2022-03-16 DIAGNOSIS — E119 Type 2 diabetes mellitus without complications: Secondary | ICD-10-CM | POA: Diagnosis not present

## 2022-03-16 DIAGNOSIS — Z5111 Encounter for antineoplastic chemotherapy: Secondary | ICD-10-CM

## 2022-03-16 DIAGNOSIS — C3491 Malignant neoplasm of unspecified part of right bronchus or lung: Secondary | ICD-10-CM

## 2022-03-16 DIAGNOSIS — C7951 Secondary malignant neoplasm of bone: Secondary | ICD-10-CM

## 2022-03-16 DIAGNOSIS — C3411 Malignant neoplasm of upper lobe, right bronchus or lung: Secondary | ICD-10-CM | POA: Diagnosis not present

## 2022-03-16 DIAGNOSIS — R197 Diarrhea, unspecified: Secondary | ICD-10-CM | POA: Diagnosis not present

## 2022-03-16 DIAGNOSIS — R5383 Other fatigue: Secondary | ICD-10-CM | POA: Diagnosis not present

## 2022-03-16 DIAGNOSIS — C787 Secondary malignant neoplasm of liver and intrahepatic bile duct: Secondary | ICD-10-CM | POA: Diagnosis not present

## 2022-03-16 DIAGNOSIS — I10 Essential (primary) hypertension: Secondary | ICD-10-CM | POA: Diagnosis not present

## 2022-03-16 NOTE — Progress Notes (Signed)
[facility] [phone]   [phone]  OFFICE PROGRESS NOTE  Kristi Saver, Kristi Jennings [address] [place] [address]  DIAGNOSIS: Stage IV (T3, N2, M1 C) non-small cell lung cancer with unknown histologic subtype.  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   DETECTED ALTERATION(S) / BIOMARKER(S)      % CFDNA OR AMPLIFICATION        ASSOCIATED FDA-APPROVED THERAPIES         CLINICAL TRIAL AVAILABILITY [id number] 8.9%   Sotorasib Yes [id number] 7.2% None     Yes     PRIOR THERAPY:   1) Palliative radiotherapy to the painful bone lesions under the care of Dr. Sondra Come.  2) Palliative systemic chemotherapy with carboplatin for AUC of 5, Alimta 500 Mg/M2 and Keytruda 200 Mg IV every 3 weeks.  First dose of treatment on December 29, 2020. Status post 18  cycles.  Starting from cycle #5, the patient is going to start maintenance Alimta and Keytruda. Keytruda discontinued from cycle #7 due to myalgias.  Last cycle was given on December 20, 2021 discontinued secondary to disease progression. 3) Possible palliative radiation to the enlarging pulmonary mass under the care of Dr. Sondra Come.   CURRENT THERAPY: 1) Krazati (Adagrasib) 600 Mg p.o. twice daily.  First dose started January 17, 2022 2) Zometa every 6 weeks starting on 02/10/21   INTERVAL HISTORY: Kristi Jennings 76 y.o. female returns to the clinic today for follow-up visit accompanied by her husband.  The patient has been tolerating her treatment with Alfred Levins (Adagrasib) fairly well.  She denied having any significant chest pain, shortness of breath, cough or hemoptysis.  She had mild fatigue.  She has few episodes of diarrhea but now she is taking Imodium with improvement in her condition.  She denied having any weight loss or night  sweats.  She has no nausea, vomiting, abdominal pain or constipation.  She is here today for evaluation with repeat CT scan of the chest, abdomen and pelvis for restaging of her disease.  MEDICAL HISTORY: Past Medical History:  Diagnosis Date   Borderline diabetes mellitus    Bronchogenic cancer of right lung ([place])    Dyslipidemia    History of radiation therapy    right chest 12/21/2020-12/24/2020  Dr Gery Pray   History of radiation therapy    right lung 06/07/2021-06/17/2021  Dr Gery Pray   Hypertension    Polymyalgia rheumatica ([place])     ALLERGIES:  is allergic to shellfish-derived products and augmentin [amoxicillin-pot clavulanate].  MEDICATIONS:  Current Outpatient Medications  Medication Sig Dispense Refill   acetaminophen (TYLENOL) 650 MG CR tablet Take 1,300 mg by mouth 3 (three) times daily as needed for pain.     adagrasib (KRAZATI) 200 MG tablet Take 3 tablets (600 mg total) by mouth 2 (two) times daily. 180 tablet 0   atenolol (TENORMIN) 25 MG tablet Take 12.5 mg by mouth at bedtime.     Calcium-Vitamin D-Vitamin K (CALCIUM + D) [phone] MG-UNT-MCG CHEW      Cholecalciferol (VITAMIN D) 50 MCG (2000 UT) tablet      diclofenac Sodium (VOLTAREN) 1 % GEL Apply 4 g topically 4 (four) times daily. (Patient taking differently: Apply 4 g topically 4 (four) times daily as needed (pain).) 150 g  0   dronabinol (MARINOL) 5 MG capsule Take 1 capsule (5 mg total) by mouth 2 (two) times daily before a meal. 60 capsule 1   enoxaparin (LOVENOX) 60 MG/0.6ML injection INJECT THE CONTENTS OF ONE SYRINGE UNDER THE SKIN ONE TIME DAILY 18 mL 2   escitalopram (LEXAPRO) 20 MG tablet Take 1 tablet (20 mg total) by mouth at bedtime. 90 tablet 0   fexofenadine (ALLEGRA) 180 MG tablet Take 180 mg by mouth daily.     fluocinonide (LIDEX) 0.05 % external solution Apply topically daily.     folic acid (FOLVITE) 1 MG tablet TAKE 1 TABLET BY MOUTH ONE TIME DAILY 30 tablet 2   HYDROmorphone (DILAUDID)  4 MG tablet Take 4 mg by mouth every 6 (six) hours as needed for severe pain. Quantity of 120 tablets     mirtazapine (REMERON) 15 MG tablet Take 15 mg by mouth at bedtime.     morphine (MS CONTIN) 30 MG 12 hr tablet Take 1 tablet (30 mg total) by mouth every 12 (twelve) hours. 60 tablet 0   Multiple Vitamin (MULTI VITAMIN) TABS      ondansetron (ZOFRAN-ODT) 4 MG disintegrating tablet Take 1 tablet (4 mg total) by mouth 2 (two) times daily as needed for nausea or vomiting. (Patient not taking: Reported on 11/30/2021) 20 tablet 1   OVER THE COUNTER MEDICATION Take 2 Cans by mouth 2 (two) times daily as needed (for nutrition). Premier protein     predniSONE (DELTASONE) 1 MG tablet Take 3 mg by mouth daily.     predniSONE (DELTASONE) 5 MG tablet Take 5 mg by mouth daily. (Patient not taking: Reported on 9/62/2297)     Salicylic Acid (SCALPICIN EX) Apply 1 application. topically daily as needed.     senna-docusate (SENOKOT-S) 8.6-50 MG tablet Take 1 tablet by mouth 2 (two) times daily. 30 tablet 2   triamcinolone ointment (KENALOG) 0.1 % Apply 1 application. topically 2 (two) times daily.     No current facility-administered medications for this visit.    SURGICAL HISTORY: No past surgical history on file.  REVIEW OF SYSTEMS:  Constitutional: positive for fatigue Eyes: negative Ears, nose, mouth, throat, and face: negative Respiratory: negative Cardiovascular: negative Gastrointestinal: positive for diarrhea Genitourinary:negative Integument/breast: negative Hematologic/lymphatic: negative Musculoskeletal:negative Neurological: negative Behavioral/Psych: negative Endocrine: negative Allergic/Immunologic: negative   PHYSICAL EXAMINATION: General appearance: alert, cooperative, fatigued, and no distress Head: Normocephalic, without obvious abnormality, atraumatic Neck: no adenopathy, no JVD, supple, symmetrical, trachea midline, and thyroid not enlarged, symmetric, no  tenderness/mass/nodules Lymph nodes: Cervical, supraclavicular, and axillary nodes normal. Resp: clear to auscultation bilaterally Back: symmetric, no curvature. ROM normal. No CVA tenderness. Cardio: regular rate and rhythm, S1, S2 normal, no murmur, click, rub or gallop GI: soft, non-tender; bowel sounds normal; no masses,  no organomegaly Extremities: extremities normal, atraumatic, no cyanosis or edema Neurologic: Alert and oriented X 3, normal strength and tone. Normal symmetric reflexes. Normal coordination and gait  ECOG PERFORMANCE STATUS: 1 - Symptomatic but completely ambulatory  Blood pressure (!) 108/58, pulse 78, temperature 98.2 F (36.8 C), temperature source Oral, resp. rate 16, height _0  (1.676 m), weight 89 lb 4 oz (40.5 kg), SpO2 95 %.  LABORATORY DATA: Lab Results  Component Value Date   WBC 8.7 03/13/2022   HGB 12.9 03/13/2022   HCT 39.6 03/13/2022   MCV 90.2 03/13/2022   PLT 201 03/13/2022      Chemistry      Component Value Date/Time   NA  139 03/13/2022 1018   K 5.5 (H) 03/13/2022 1018   CL 100 03/13/2022 1018   CO2 30 03/13/2022 1018   BUN 21 03/13/2022 1018   CREATININE 1.14 (H) 03/13/2022 1018      Component Value Date/Time   CALCIUM 10.3 03/13/2022 1018   ALKPHOS 63 03/13/2022 1018   AST 25 03/13/2022 1018   ALT 17 03/13/2022 1018   BILITOT 0.5 03/13/2022 1018       RADIOGRAPHIC STUDIES: CT Chest W Contrast  Result Date: 03/15/2022 CLINICAL DATA:  Non-small cell lung cancer staging in a 76 year old female. * Tracking Code: BO * EXAM: CT CHEST, ABDOMEN, AND PELVIS WITH CONTRAST TECHNIQUE: Multidetector CT imaging of the chest, abdomen and pelvis was performed following the standard protocol during bolus administration of intravenous contrast. RADIATION DOSE REDUCTION: This exam was performed according to the departmental dose-optimization program which includes automated exposure control, adjustment of the mA and/or kV according to patient  size and/or use of iterative reconstruction technique. CONTRAST:  58m OMNIPAQUE IOHEXOL 300 MG/ML  SOLN COMPARISON:  January 07, 2022. FINDINGS: CT CHEST FINDINGS Cardiovascular: Calcified aortic atherosclerotic changes. Stable dilation of the thoracic aorta to 4.2 cm. Normal heart size. No pericardial effusion or nodularity. Normal caliber of central pulmonary vessels. Mediastinum/Nodes: No thoracic inlet, axillary, mediastinal or hilar adenopathy. Esophagus grossly normal. Lungs/Pleura: Mass in the RIGHT upper lobe (image 26/4) 2.9 x 3.6 as compared to 3.9 x 3.9 cm. Developing cavitation in an area just above the RIGHT hilum previously soft tissue density. This area measuring 2.1 x 1.5 cm previously imaged in the same plane as the mass measuring approximately 2.6 x 1.8 cm. Spiculation tracking towards the pleural surface. Mass extending into the RIGHT lung apex as on previous imaging. Discrete nodule in the RIGHT upper chest along the major fissure (image 37/4) much less conspicuous than on the prior exam measuring 8 x 5 mm previously 13 mm greatest axial dimension. Less soft tissue density associated with this area on the current study. No new areas of nodularity or masslike change in the chest. No pleural effusion. Musculoskeletal: See below for full musculoskeletal details. Destructive changes in RIGHT anterolateral third rib similar to prior imaging. CT ABDOMEN PELVIS FINDINGS Hepatobiliary: Adrenal and hepatic involvement from adrenal metastasis with marked interval improvement. Adrenal mass at 4.0 x 3.2 cm (image 39/2) previously 5.1 x 4.5 cm. Area of greatest hepatic involvement in hepatic subsegment VII just behind the superior vena cava (image 47/2) 2.4 x 1.9 cm previously 4.7 cm greatest axial dimension. Small area of RIGHT posterior hepatic involvement (image 49/2) 12 mm, this showed direct extension from the tumor in the adrenal on the prior exam and is of similar size following regression of the  adrenal and additional hepatic changes. No additional hepatic lesions. No pericholecystic stranding. No biliary duct dilation. Portal vein is patent. Pancreas: Normal, without mass, inflammation or ductal dilatation. Spleen: Normal. Adrenals/Urinary Tract: RIGHT adrenal mass as discussed. LEFT adrenal unremarkable. No signs of hydronephrosis or evidence of perinephric stranding. No perivesical stranding. Stomach/Bowel: No acute gastrointestinal process. Vascular/Lymphatic: Aortic atherosclerosis. No sign of aneurysm. Smooth contour of the IVC. There is no gastrohepatic or hepatoduodenal ligament lymphadenopathy. No retroperitoneal or mesenteric lymphadenopathy. No pelvic sidewall lymphadenopathy. Atherosclerotic changes are moderate to marked with calcification throughout the abdominal aorta. Reproductive: Unremarkable by CT. Other: No ascites. Musculoskeletal: Signs of thoracic compression fractures at the T4, T8 and T12 levels without change. Sclerosis at T4 suggests underlying metastatic process. Other levels show no  change over time but again are suggestive of pathologic compression fractures. No change the appearance of these vertebral levels since at least June of 2023. RIGHT rib destruction similar to prior imaging. Signs of chronic sternal fracture also similar to prior imaging. IMPRESSION: 1. Interval response to therapy with respect to the RIGHT upper lobe mass and RIGHT upper lobe nodule. 2. Marked interval improvement with respect to RIGHT adrenal and hepatic metastatic disease. 3. Signs of probable pathologic compression fractures at T4, T8 and T12 levels without change. 4. Signs of rib destruction and sternal fracture with chronic appearance, unchanged. 5. Stable dilation of the ascending thoracic aorta to 4.2 cm. Consider attention on follow-up or dedicated annual follow-up imaging as warranted. 6. Aortic atherosclerosis. Aortic Atherosclerosis (ICD10-I70.0). Electronically Signed   By: Zetta Bills  M.D.   On: 03/15/2022 11:07   CT Abdomen Pelvis W Contrast  Result Date: 03/15/2022 CLINICAL DATA:  Non-small cell lung cancer staging in a 76 year old female. * Tracking Code: BO * EXAM: CT CHEST, ABDOMEN, AND PELVIS WITH CONTRAST TECHNIQUE: Multidetector CT imaging of the chest, abdomen and pelvis was performed following the standard protocol during bolus administration of intravenous contrast. RADIATION DOSE REDUCTION: This exam was performed according to the departmental dose-optimization program which includes automated exposure control, adjustment of the mA and/or kV according to patient size and/or use of iterative reconstruction technique. CONTRAST:  7m OMNIPAQUE IOHEXOL 300 MG/ML  SOLN COMPARISON:  January 07, 2022. FINDINGS: CT CHEST FINDINGS Cardiovascular: Calcified aortic atherosclerotic changes. Stable dilation of the thoracic aorta to 4.2 cm. Normal heart size. No pericardial effusion or nodularity. Normal caliber of central pulmonary vessels. Mediastinum/Nodes: No thoracic inlet, axillary, mediastinal or hilar adenopathy. Esophagus grossly normal. Lungs/Pleura: Mass in the RIGHT upper lobe (image 26/4) 2.9 x 3.6 as compared to 3.9 x 3.9 cm. Developing cavitation in an area just above the RIGHT hilum previously soft tissue density. This area measuring 2.1 x 1.5 cm previously imaged in the same plane as the mass measuring approximately 2.6 x 1.8 cm. Spiculation tracking towards the pleural surface. Mass extending into the RIGHT lung apex as on previous imaging. Discrete nodule in the RIGHT upper chest along the major fissure (image 37/4) much less conspicuous than on the prior exam measuring 8 x 5 mm previously 13 mm greatest axial dimension. Less soft tissue density associated with this area on the current study. No new areas of nodularity or masslike change in the chest. No pleural effusion. Musculoskeletal: See below for full musculoskeletal details. Destructive changes in RIGHT anterolateral  third rib similar to prior imaging. CT ABDOMEN PELVIS FINDINGS Hepatobiliary: Adrenal and hepatic involvement from adrenal metastasis with marked interval improvement. Adrenal mass at 4.0 x 3.2 cm (image 39/2) previously 5.1 x 4.5 cm. Area of greatest hepatic involvement in hepatic subsegment VII just behind the superior vena cava (image 47/2) 2.4 x 1.9 cm previously 4.7 cm greatest axial dimension. Small area of RIGHT posterior hepatic involvement (image 49/2) 12 mm, this showed direct extension from the tumor in the adrenal on the prior exam and is of similar size following regression of the adrenal and additional hepatic changes. No additional hepatic lesions. No pericholecystic stranding. No biliary duct dilation. Portal vein is patent. Pancreas: Normal, without mass, inflammation or ductal dilatation. Spleen: Normal. Adrenals/Urinary Tract: RIGHT adrenal mass as discussed. LEFT adrenal unremarkable. No signs of hydronephrosis or evidence of perinephric stranding. No perivesical stranding. Stomach/Bowel: No acute gastrointestinal process. Vascular/Lymphatic: Aortic atherosclerosis. No sign of aneurysm. Smooth contour  of the IVC. There is no gastrohepatic or hepatoduodenal ligament lymphadenopathy. No retroperitoneal or mesenteric lymphadenopathy. No pelvic sidewall lymphadenopathy. Atherosclerotic changes are moderate to marked with calcification throughout the abdominal aorta. Reproductive: Unremarkable by CT. Other: No ascites. Musculoskeletal: Signs of thoracic compression fractures at the T4, T8 and T12 levels without change. Sclerosis at T4 suggests underlying metastatic process. Other levels show no change over time but again are suggestive of pathologic compression fractures. No change the appearance of these vertebral levels since at least June of 2023. RIGHT rib destruction similar to prior imaging. Signs of chronic sternal fracture also similar to prior imaging. IMPRESSION: 1. Interval response to  therapy with respect to the RIGHT upper lobe mass and RIGHT upper lobe nodule. 2. Marked interval improvement with respect to RIGHT adrenal and hepatic metastatic disease. 3. Signs of probable pathologic compression fractures at T4, T8 and T12 levels without change. 4. Signs of rib destruction and sternal fracture with chronic appearance, unchanged. 5. Stable dilation of the ascending thoracic aorta to 4.2 cm. Consider attention on follow-up or dedicated annual follow-up imaging as warranted. 6. Aortic atherosclerosis. Aortic Atherosclerosis (ICD10-I70.0). Electronically Signed   By: Zetta Bills M.D.   On: 03/15/2022 11:07    ASSESSMENT AND PLAN: This is a very pleasant 76 years old white female diagnosed with Stage IV (T3, N2, M1 C) non-small cell lung cancer with unknown histologic subtype, likely adenocarcinoma with positive KRAS G12C mutation.  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  Status post palliative radiotherapy to the painful bone lesions under the care of Dr. Sondra Come.  The patient started induction systemic chemotherapy with carboplatin for AUC of 5, Alimta 500 Mg/M2 and Keytruda 200 Mg IV every 3 weeks and currently on maintenance treatment with single agent Alimta after Keytruda was discontinued starting from cycle #7 because of significant arthralgia and myalgia.  She is status post 18 cycles of total treatment.  The patient has been tolerating this treatment well except for fatigue. She underwent palliative radiotherapy to enlarging pulmonary nodule under the care of Dr. Sondra Come. Unfortunately her repeat imaging studies with CT scan showed evidence for disease progression with increase in the size of the apical right lung mass in addition to new and enlarging pulmonary metastasis as well as significant increase in the large right  adrenal metastasis and new metastatic adenopathy to the retroperitoneum. The patient started treatment with Krazati (Adagrasib) 600 mg p.o. twice daily on January 17, 2022.  Status post 2 months of treatment. The patient has been tolerating this treatment well with no concerning complaints except for mild fatigue and few episodes of diarrhea. She had repeat CT scan of the chest, abdomen and pelvis performed recently.  I personally and independently reviewed the scan and discussed the result with the patient and her husband. Her scan showed no concerning findings for disease progression and there was actually improvement of her disease in the lung as well as the adrenal glands. I recommended for her to continue her current treatment with Alfred Levins (Adagrasib) with the same dose. I will see her back for follow-up visit in 1 months for evaluation and repeat blood work. For the pain management, she is followed by the palliative care team and she is currently on MS Contin. For the lack of appetite she is on Marinol. For the metastatic bone disease she  will continue her current treatment with Zometa every 6 weeks. The patient was advised to call immediately if she has any other concerning symptoms in the interval. The patient voices understanding of current disease status and treatment options and is in agreement with the current care plan.  All questions were answered. The patient knows to call the clinic with any problems, questions or concerns. We can certainly see the patient much sooner if necessary.  The total time spent in the appointment was 40 minutes.  Disclaimer: This note was dictated with voice recognition software. Similar sounding words can inadvertently be transcribed and may not be corrected upon review.

## 2022-03-20 DIAGNOSIS — M0579 Rheumatoid arthritis with rheumatoid factor of multiple sites without organ or systems involvement: Secondary | ICD-10-CM | POA: Diagnosis not present

## 2022-03-20 DIAGNOSIS — M353 Polymyalgia rheumatica: Secondary | ICD-10-CM | POA: Diagnosis not present

## 2022-03-20 DIAGNOSIS — Z681 Body mass index (BMI) 19 or less, adult: Secondary | ICD-10-CM | POA: Diagnosis not present

## 2022-03-20 DIAGNOSIS — M1991 Primary osteoarthritis, unspecified site: Secondary | ICD-10-CM | POA: Diagnosis not present

## 2022-03-20 DIAGNOSIS — M4854XA Collapsed vertebra, not elsewhere classified, thoracic region, initial encounter for fracture: Secondary | ICD-10-CM | POA: Diagnosis not present

## 2022-03-20 DIAGNOSIS — M154 Erosive (osteo)arthritis: Secondary | ICD-10-CM | POA: Diagnosis not present

## 2022-03-21 DIAGNOSIS — C3490 Malignant neoplasm of unspecified part of unspecified bronchus or lung: Secondary | ICD-10-CM | POA: Diagnosis not present

## 2022-03-23 ENCOUNTER — Telehealth: Payer: Self-pay | Admitting: Pharmacy Technician

## 2022-03-23 NOTE — Telephone Encounter (Signed)
Oral Oncology Patient Advocate Encounter   Received notification that patient is due for re-enrollment for assistance for Krazati through [facility].  Re-enrollment submitted via online portal.  [facility] phone number [phone].   I will continue to follow until final determination.  Lady Deutscher, CPhT-Adv Oncology Pharmacy Patient [facility] Direct Number: [phone]  Fax: [phone]

## 2022-03-24 ENCOUNTER — Telehealth: Payer: Self-pay | Admitting: Psychiatry

## 2022-03-24 NOTE — Telephone Encounter (Signed)
Called patient regarding upcoming December appointments, patient is notified. 

## 2022-03-29 ENCOUNTER — Other Ambulatory Visit: Payer: Self-pay | Admitting: Nurse Practitioner

## 2022-03-29 DIAGNOSIS — C3491 Malignant neoplasm of unspecified part of right bronchus or lung: Secondary | ICD-10-CM

## 2022-03-29 DIAGNOSIS — C7951 Secondary malignant neoplasm of bone: Secondary | ICD-10-CM

## 2022-04-08 DIAGNOSIS — C3491 Malignant neoplasm of unspecified part of right bronchus or lung: Secondary | ICD-10-CM | POA: Diagnosis not present

## 2022-04-14 DIAGNOSIS — C3491 Malignant neoplasm of unspecified part of right bronchus or lung: Secondary | ICD-10-CM | POA: Diagnosis not present

## 2022-04-16 NOTE — Progress Notes (Deleted)
   Established Patient Office Visit  Subjective   Patient ID: Kristi Jennings, female    DOB: [birth year]  Age: 76 y.o. MRN: [id number]  No chief complaint on file.   HPI  Patient is here for for 74-month follow-up.   Stage 4 lung cancer (right) with metastatic bone tumor - She follows with palliative and receives chemo every three weeks. ***  Anxiety - She reports her mood has been well controlled on Lexapro. No SI/HI.  HYPERTENSION: - Medications: atenolol *** - Compliance: *** - Checking BP at home: *** - Denies any SOB, recurrent headaches, CP, vision changes, LE edema, dizziness, palpitations, or medication side effects. - Diet: *** - Exercise: *** - Stressors:     {History (Optional):23778}  ROS    Objective:     There were no vitals taken for this visit. {Vitals History (Optional):23777}  Physical Exam   No results found for any visits on 04/18/22.  {Labs (Optional):23779}  The 10-year ASCVD risk score (Arnett DK, et al., 2019) is: 24.2%    Assessment & Plan:   Problem List Items Addressed This Visit   None   No follow-ups on file.    [name], NP

## 2022-04-17 ENCOUNTER — Ambulatory Visit: Payer: Medicare HMO | Admitting: Internal Medicine

## 2022-04-17 ENCOUNTER — Other Ambulatory Visit: Payer: Medicare HMO

## 2022-04-18 ENCOUNTER — Ambulatory Visit: Payer: Medicare HMO | Admitting: Family Medicine

## 2022-04-20 DIAGNOSIS — C3490 Malignant neoplasm of unspecified part of unspecified bronchus or lung: Secondary | ICD-10-CM | POA: Diagnosis not present

## 2022-04-25 ENCOUNTER — Other Ambulatory Visit: Payer: Self-pay

## 2022-04-25 ENCOUNTER — Inpatient Hospital Stay (HOSPITAL_BASED_OUTPATIENT_CLINIC_OR_DEPARTMENT_OTHER): Payer: Medicare HMO | Admitting: Nurse Practitioner

## 2022-04-25 ENCOUNTER — Inpatient Hospital Stay: Payer: Medicare HMO | Admitting: Internal Medicine

## 2022-04-25 ENCOUNTER — Encounter: Payer: Self-pay | Admitting: Nurse Practitioner

## 2022-04-25 ENCOUNTER — Inpatient Hospital Stay: Payer: Medicare HMO | Attending: Nurse Practitioner

## 2022-04-25 VITALS — BP 119/62 | HR 52 | Temp 98.2°F | Resp 15 | Wt 86.2 lb

## 2022-04-25 DIAGNOSIS — C7951 Secondary malignant neoplasm of bone: Secondary | ICD-10-CM | POA: Diagnosis not present

## 2022-04-25 DIAGNOSIS — R630 Anorexia: Secondary | ICD-10-CM | POA: Diagnosis not present

## 2022-04-25 DIAGNOSIS — C3491 Malignant neoplasm of unspecified part of right bronchus or lung: Secondary | ICD-10-CM | POA: Diagnosis not present

## 2022-04-25 DIAGNOSIS — I10 Essential (primary) hypertension: Secondary | ICD-10-CM | POA: Diagnosis not present

## 2022-04-25 DIAGNOSIS — G893 Neoplasm related pain (acute) (chronic): Secondary | ICD-10-CM

## 2022-04-25 DIAGNOSIS — R530 Neoplastic (malignant) related fatigue: Secondary | ICD-10-CM

## 2022-04-25 DIAGNOSIS — R634 Abnormal weight loss: Secondary | ICD-10-CM

## 2022-04-25 DIAGNOSIS — Z5111 Encounter for antineoplastic chemotherapy: Secondary | ICD-10-CM

## 2022-04-25 DIAGNOSIS — C7971 Secondary malignant neoplasm of right adrenal gland: Secondary | ICD-10-CM | POA: Insufficient documentation

## 2022-04-25 DIAGNOSIS — C3411 Malignant neoplasm of upper lobe, right bronchus or lung: Secondary | ICD-10-CM | POA: Insufficient documentation

## 2022-04-25 DIAGNOSIS — Z515 Encounter for palliative care: Secondary | ICD-10-CM

## 2022-04-25 LAB — CBC WITH DIFFERENTIAL (CANCER CENTER ONLY)
Abs Immature Granulocytes: 0.04 10*3/uL (ref 0.00–0.07)
Basophils Absolute: 0 10*3/uL (ref 0.0–0.1)
Basophils Relative: 0 %
Eosinophils Absolute: 0.1 10*3/uL (ref 0.0–0.5)
Eosinophils Relative: 1 %
HCT: 37.2 % (ref 36.0–46.0)
Hemoglobin: 12.3 g/dL (ref 12.0–15.0)
Immature Granulocytes: 1 %
Lymphocytes Relative: 18 %
Lymphs Abs: 1.2 10*3/uL (ref 0.7–4.0)
MCH: 29.2 pg (ref 26.0–34.0)
MCHC: 33.1 g/dL (ref 30.0–36.0)
MCV: 88.4 fL (ref 80.0–100.0)
Monocytes Absolute: 1 10*3/uL (ref 0.1–1.0)
Monocytes Relative: 15 %
Neutro Abs: 4.5 10*3/uL (ref 1.7–7.7)
Neutrophils Relative %: 65 %
Platelet Count: 160 10*3/uL (ref 150–400)
RBC: 4.21 MIL/uL (ref 3.87–5.11)
RDW: 14.9 % (ref 11.5–15.5)
WBC Count: 6.9 10*3/uL (ref 4.0–10.5)
nRBC: 0 % (ref 0.0–0.2)

## 2022-04-25 LAB — CMP (CANCER CENTER ONLY)
ALT: 17 U/L (ref 0–44)
AST: 33 U/L (ref 15–41)
Albumin: 4 g/dL (ref 3.5–5.0)
Alkaline Phosphatase: 57 U/L (ref 38–126)
Anion gap: 4 — ABNORMAL LOW (ref 5–15)
BUN: 19 mg/dL (ref 8–23)
CO2: 33 mmol/L — ABNORMAL HIGH (ref 22–32)
Calcium: 9.9 mg/dL (ref 8.9–10.3)
Chloride: 98 mmol/L (ref 98–111)
Creatinine: 1.09 mg/dL — ABNORMAL HIGH (ref 0.44–1.00)
GFR, Estimated: 53 mL/min — ABNORMAL LOW (ref >60)
Glucose, Bld: 117 mg/dL — ABNORMAL HIGH (ref 70–99)
Potassium: 4.4 mmol/L (ref 3.5–5.1)
Sodium: 135 mmol/L (ref 135–145)
Total Bilirubin: 0.6 mg/dL (ref 0.3–1.2)
Total Protein: 6.6 g/dL (ref 6.5–8.1)

## 2022-04-25 MED ORDER — MORPHINE SULFATE ER 30 MG PO TBCR: 30.0000 mg | EXTENDED_RELEASE_TABLET | Freq: Two times a day (BID) | ORAL | 0 refills | Status: DC

## 2022-04-25 MED ORDER — DRONABINOL 10 MG PO CAPS: 10.0000 mg | ORAL_CAPSULE | Freq: Two times a day (BID) | ORAL | 0 refills | Status: DC

## 2022-04-25 NOTE — Progress Notes (Signed)
[place]  [phone] [phone]   Name: Kristi Jennings Date: 04/25/2022 MRN: [id number]  DOB: June 04, 1945  Patient Care Team: [name], NP as PCP - General (Family Medicine) [place] as Registered Nurse [facility] and Palliative Medicine)    INTERVAL HISTORY: Kristi Jennings is a 76 y.o. 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 Palliative ask to see for symptom management.  SOCIAL HISTORY:     reports that she quit smoking about 13 years ago. Her smoking use included cigarettes. She has a 43.00 pack-year smoking history. She has never used smokeless tobacco. She reports that she does not currently use alcohol. She reports that she does not use drugs.  ADVANCE DIRECTIVES:  None on file   CODE STATUS:   PAST MEDICAL HISTORY: Past Medical History:  Diagnosis Date   Borderline diabetes mellitus    Bronchogenic cancer of right lung ([place])    Dyslipidemia    History of radiation therapy    right chest 12/21/2020-12/24/2020  Dr Gery Pray   History of radiation therapy    right lung 06/07/2021-06/17/2021  Dr Gery Pray   Hypertension    Polymyalgia rheumatica ([place])    ALLERGIES:  is allergic to shellfish-derived products and augmentin [amoxicillin-pot clavulanate].  MEDICATIONS:  Current Outpatient Medications  Medication Sig Dispense Refill   acetaminophen (TYLENOL) 650 MG CR tablet Take 1,300 mg by mouth 3 (three) times daily as needed for pain.     adagrasib (KRAZATI) 200 MG tablet Take 3 tablets (600 mg total) by mouth 2 (two) times daily. 180 tablet 0   atenolol (TENORMIN) 25 MG tablet Take 12.5 mg by mouth at bedtime.     Calcium-Vitamin D-Vitamin K (CALCIUM + D) [id number] MG-UNT-MCG CHEW      Cholecalciferol (VITAMIN D) 50 MCG (2000 UT) tablet      diclofenac Sodium (VOLTAREN) 1 % GEL  Apply 4 g topically 4 (four) times daily. (Patient taking differently: Apply 4 g topically 4 (four) times daily as needed (pain).) 150 g 0   dronabinol (MARINOL) 10 MG capsule Take 1 capsule (10 mg total) by mouth 2 (two) times daily before lunch and supper. 60 capsule 0   enoxaparin (LOVENOX) 60 MG/0.6ML injection INJECT THE CONTENTS OF ONE SYRINGE UNDER THE SKIN ONE TIME DAILY 18 mL 2   escitalopram (LEXAPRO) 20 MG tablet Take 1 tablet (20 mg total) by mouth at bedtime. 90 tablet 0   fexofenadine (ALLEGRA) 180 MG tablet Take 180 mg by mouth daily.     fluocinonide (LIDEX) 0.05 % external solution Apply topically daily.     folic acid (FOLVITE) 1 MG tablet TAKE 1 TABLET BY MOUTH ONE TIME DAILY 30 tablet 2   HYDROmorphone (DILAUDID) 4 MG tablet Take 4 mg by mouth every 6 (six) hours as needed for severe pain. Quantity of 120 tablets     mirtazapine (REMERON) 15 MG tablet Take 15 mg by mouth at bedtime.     morphine (MS CONTIN) 30 MG 12 hr tablet Take 1 tablet (30 mg total) by mouth every 12 (twelve) hours. 60 tablet 0   Multiple Vitamin (MULTI VITAMIN) TABS      ondansetron (ZOFRAN-ODT) 4 MG disintegrating tablet Take 1 tablet (4 mg total) by mouth 2 (two) times daily as needed for nausea or vomiting. (Patient not taking: Reported on 11/30/2021) 20 tablet 1  OVER THE COUNTER MEDICATION Take 2 Cans by mouth 2 (two) times daily as needed (for nutrition). Premier protein     predniSONE (DELTASONE) 1 MG tablet Take 3 mg by mouth daily.     predniSONE (DELTASONE) 5 MG tablet Take 5 mg by mouth daily. (Patient not taking: Reported on 7/51/0258)     Salicylic Acid (SCALPICIN EX) Apply 1 application. topically daily as needed.     senna-docusate (SENOKOT-S) 8.6-50 MG tablet Take 1 tablet by mouth 2 (two) times daily. 30 tablet 2   triamcinolone ointment (KENALOG) 0.1 % Apply 1 application. topically 2 (two) times daily.     No current facility-administered medications for this visit.    VITAL  SIGNS: There were no vitals taken for this visit. There were no vitals filed for this visit.   Estimated body mass index is 13.91 kg/m as calculated from the following:   Height as of 03/16/22: 5\' 6"  (1.676 m).   Weight as of an earlier encounter on 04/25/22: 86 lb 3.2 oz (39.1 kg).  PERFORMANCE STATUS (ECOG) : 1 - Symptomatic but completely ambulatory  Physical Exam General: NAD, ambulatory, thin  Resp: Normal breathing pattern Neurological: AAO x4, mood appropriate   IMPRESSION:  Kristi Jennings presents to clinic today with her husband for ongoing symptom management support.Trying to remain as active as possible however continues to struggle with ongoing fatigue. She was also seen by Dr. Julien Nordmann today who recommended increasing activity. We discussed ways to become more active. She feels at times she is to weak to exercise. Education provided on [employer]. We discussed different programs that they have including chair yoga. Patient and husband verbalized understanding and plans to look more into it.   Pain Kristi Jennings reports her pain is well controlled. We initially weaned her down to MS Contin 15mg  once daily however due to reoccurring pain and discomfort required to restart twice daily dosing. She is not requiring any breakthrough pain medications.   We discussed plans to re-attempt weaning down MS Contin in the near future. She and husband verbalized understanding. We discussed getting through the holidays and possibly attempting to decrease MS Contin in upcoming months.     Constipation No concerns with constipation.  Continues to take MiraLAX and senna as needed for bowel regimen.  Anxiety Her anxiety has decreased significantly mainly due to to her improved wellbeing and decreased pain.  She states she is focusing on continued improvement and hopes things remain stable.   Decreased appetite/Weight loss Appetite continues to fluctuate. Some days are better than  others. She is tolerating dronabinol. Some concerns with cost however if providing some improvement they are willing to continue. She unfortunately have been out for several days. Kristi Jennings confirms noticeable decrease in her appetite over past 2-3 days where she has not had desire to eat dinner or breakfast. She has been taking once daily. Education provided and will begin taking twice daily. She and husband verbalized understanding.   Will continue to closely monitor.   Her weight has decreased down to 86lbs from  89lbs 10/26, 87lbs on 9/26, 91lbs on 8/1.89 lbs 4/19,  88.8 lbs on 3/29, 90 lbs on 3/8,  88lbs 2/15, 89lbs on 1/25 and 92lb on 1/11.   PLAN: Continue MS Contin as prescribed. We will continue to closely evaluate and began to wean down and eventually discontinue if possible.  Dronabinol 10 mg twice daily for appetite.  Ongoing symptom management (see above) will plan to re-attempt weaning  of MS Contin given pain is decreasing. I will plan to see patient back in 4-6 weeks in collaboration with her other appointments.    Patient expressed understanding and was in agreement with this plan. She also understands that She can call the clinic at any time with any questions, concerns, or complaints.    Any controlled substances utilized were prescribed in the context of palliative care. PDMP has been reviewed.    Time Total: 45 min   Visit consisted of counseling and education dealing with the complex and emotionally intense issues of symptom management and palliative care in the setting of serious and potentially life-threatening illness.Greater than 50%  of this time was spent counseling and coordinating care related to the above assessment and plan.  Alda Lea, AGPCNP-BC  Palliative Medicine Team/[place]

## 2022-04-25 NOTE — Progress Notes (Signed)
[place] Telephone:([phone]) [phone]   [phone]  OFFICE PROGRESS NOTE  Kristi Saver, NP [address] [place] [address]  DIAGNOSIS: Stage IV (T3, N2, M1 C) non-small cell lung cancer with unknown histologic subtype.  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   DETECTED ALTERATION(S) / BIOMARKER(S)      % CFDNA OR AMPLIFICATION        ASSOCIATED FDA-APPROVED THERAPIES         CLINICAL TRIAL AVAILABILITY [id number] 8.9%   Sotorasib Yes [id number] 7.2% None     Yes     PRIOR THERAPY:   1) Palliative radiotherapy to the painful bone lesions under the care of Dr. Sondra Come.  2) Palliative systemic chemotherapy with carboplatin for AUC of 5, Alimta 500 Mg/M2 and Keytruda 200 Mg IV every 3 weeks.  First dose of treatment on December 29, 2020. Status post 18  cycles.  Starting from cycle #5, the patient is going to start maintenance Alimta and Keytruda. Keytruda discontinued from cycle #7 due to myalgias.  Last cycle was given on December 20, 2021 discontinued secondary to disease progression. 3) Possible palliative radiation to the enlarging pulmonary mass under the care of Dr. Sondra Come.   CURRENT THERAPY: 1) Krazati (Adagrasib) 600 Mg p.o. twice daily.  First dose started January 17, 2022.  Status post 4 months of treatment. 2) Zometa every 6 weeks starting on 02/10/21   INTERVAL HISTORY: Kristi Jennings 76 y.o. female returns to the clinic today for follow-up visit accompanied by her husband.  The patient continues to complain of increasing fatigue and weakness as well as lack of appetite and few pounds of weight loss.  She has been on several medications to help her appetite including Remeron, Marinol and also low-dose of prednisone by her rheumatologist.  The patient denied having any  current chest pain, shortness of breath, cough or hemoptysis.  She has no nausea, vomiting, diarrhea or constipation.  She has no headache or visual changes.  She continues to tolerate her treatment with Kristi Jennings (Adagrasib) fairly well.  She is here today for evaluation and repeat blood work.   MEDICAL HISTORY: Past Medical History:  Diagnosis Date   Borderline diabetes mellitus    Bronchogenic cancer of right lung ([place])    Dyslipidemia    History of radiation therapy    right chest 12/21/2020-12/24/2020  Dr Gery Pray   History of radiation therapy    right lung 06/07/2021-06/17/2021  Dr Gery Pray   Hypertension    Polymyalgia rheumatica ([place])     ALLERGIES:  is allergic to shellfish-derived products and augmentin [amoxicillin-pot clavulanate].  MEDICATIONS:  Current Outpatient Medications  Medication Sig Dispense Refill   acetaminophen (TYLENOL) 650 MG CR tablet Take 1,300 mg by mouth 3 (three) times daily as needed for pain.     adagrasib (KRAZATI) 200 MG tablet Take 3 tablets (600 mg total) by mouth 2 (two) times daily. 180 tablet 0   atenolol (TENORMIN) 25 MG tablet Take 12.5 mg by mouth at bedtime.     Calcium-Vitamin D-Vitamin K (CALCIUM + D) [phone] MG-UNT-MCG CHEW      Cholecalciferol (VITAMIN D) 50 MCG (2000 UT) tablet      diclofenac Sodium (VOLTAREN) 1 % GEL Apply 4 g topically 4 (four) times daily. (  Patient taking differently: Apply 4 g topically 4 (four) times daily as needed (pain).) 150 g 0   dronabinol (MARINOL) 5 MG capsule Take 1 capsule (5 mg total) by mouth 2 (two) times daily before a meal. 60 capsule 1   enoxaparin (LOVENOX) 60 MG/0.6ML injection INJECT THE CONTENTS OF ONE SYRINGE UNDER THE SKIN ONE TIME DAILY 18 mL 2   escitalopram (LEXAPRO) 20 MG tablet Take 1 tablet (20 mg total) by mouth at bedtime. 90 tablet 0   fexofenadine (ALLEGRA) 180 MG tablet Take 180 mg by mouth daily.     fluocinonide (LIDEX) 0.05 % external solution Apply topically daily.      folic acid (FOLVITE) 1 MG tablet TAKE 1 TABLET BY MOUTH ONE TIME DAILY 30 tablet 2   HYDROmorphone (DILAUDID) 4 MG tablet Take 4 mg by mouth every 6 (six) hours as needed for severe pain. Quantity of 120 tablets     mirtazapine (REMERON) 15 MG tablet Take 15 mg by mouth at bedtime.     morphine (MS CONTIN) 30 MG 12 hr tablet Take 1 tablet (30 mg total) by mouth every 12 (twelve) hours. 60 tablet 0   Multiple Vitamin (MULTI VITAMIN) TABS      ondansetron (ZOFRAN-ODT) 4 MG disintegrating tablet Take 1 tablet (4 mg total) by mouth 2 (two) times daily as needed for nausea or vomiting. (Patient not taking: Reported on 11/30/2021) 20 tablet 1   OVER THE COUNTER MEDICATION Take 2 Cans by mouth 2 (two) times daily as needed (for nutrition). Premier protein     predniSONE (DELTASONE) 1 MG tablet Take 3 mg by mouth daily.     predniSONE (DELTASONE) 5 MG tablet Take 5 mg by mouth daily. (Patient not taking: Reported on 9/67/5916)     Salicylic Acid (SCALPICIN EX) Apply 1 application. topically daily as needed.     senna-docusate (SENOKOT-S) 8.6-50 MG tablet Take 1 tablet by mouth 2 (two) times daily. 30 tablet 2   triamcinolone ointment (KENALOG) 0.1 % Apply 1 application. topically 2 (two) times daily.     No current facility-administered medications for this visit.    SURGICAL HISTORY: No past surgical history on file.  REVIEW OF SYSTEMS:  Constitutional: positive for anorexia, fatigue, and weight loss Eyes: negative Ears, nose, mouth, throat, and face: negative Respiratory: negative Cardiovascular: negative Gastrointestinal: negative Genitourinary:negative Integument/breast: negative Hematologic/lymphatic: negative Musculoskeletal:negative Neurological: negative Behavioral/Psych: negative Endocrine: negative Allergic/Immunologic: negative   PHYSICAL EXAMINATION: General appearance: alert, cooperative, fatigued, and no distress Head: Normocephalic, without obvious abnormality,  atraumatic Neck: no adenopathy, no JVD, supple, symmetrical, trachea midline, and thyroid not enlarged, symmetric, no tenderness/mass/nodules Lymph nodes: Cervical, supraclavicular, and axillary nodes normal. Resp: clear to auscultation bilaterally Back: symmetric, no curvature. ROM normal. No CVA tenderness. Cardio: regular rate and rhythm, S1, S2 normal, no murmur, click, rub or gallop GI: soft, non-tender; bowel sounds normal; no masses,  no organomegaly Extremities: extremities normal, atraumatic, no cyanosis or edema Neurologic: Alert and oriented X 3, normal strength and tone. Normal symmetric reflexes. Normal coordination and gait  ECOG PERFORMANCE STATUS: 1 - Symptomatic but completely ambulatory  Blood pressure (!) 108/58, pulse 78, temperature 98.2 F (36.8 C), temperature source Oral, resp. rate 16, height _0  (1.676 m), weight 89 lb 4 oz (40.5 kg), SpO2 95 %.  LABORATORY DATA: Lab Results  Component Value Date   WBC 8.7 03/13/2022   HGB 12.9 03/13/2022   HCT 39.6 03/13/2022   MCV 90.2 03/13/2022   PLT 201  03/13/2022      Chemistry      Component Value Date/Time   NA 139 03/13/2022 1018   K 5.5 (H) 03/13/2022 1018   CL 100 03/13/2022 1018   CO2 30 03/13/2022 1018   BUN 21 03/13/2022 1018   CREATININE 1.14 (H) 03/13/2022 1018      Component Value Date/Time   CALCIUM 10.3 03/13/2022 1018   ALKPHOS 63 03/13/2022 1018   AST 25 03/13/2022 1018   ALT 17 03/13/2022 1018   BILITOT 0.5 03/13/2022 1018       RADIOGRAPHIC STUDIES: CT Chest W Contrast  Result Date: 03/15/2022 CLINICAL DATA:  Non-small cell lung cancer staging in a 76 year old female. * Tracking Code: BO * EXAM: CT CHEST, ABDOMEN, AND PELVIS WITH CONTRAST TECHNIQUE: Multidetector CT imaging of the chest, abdomen and pelvis was performed following the standard protocol during bolus administration of intravenous contrast. RADIATION DOSE REDUCTION: This exam was performed according to the departmental  dose-optimization program which includes automated exposure control, adjustment of the mA and/or kV according to patient size and/or use of iterative reconstruction technique. CONTRAST:  4m OMNIPAQUE IOHEXOL 300 MG/ML  SOLN COMPARISON:  January 07, 2022. FINDINGS: CT CHEST FINDINGS Cardiovascular: Calcified aortic atherosclerotic changes. Stable dilation of the thoracic aorta to 4.2 cm. Normal heart size. No pericardial effusion or nodularity. Normal caliber of central pulmonary vessels. Mediastinum/Nodes: No thoracic inlet, axillary, mediastinal or hilar adenopathy. Esophagus grossly normal. Lungs/Pleura: Mass in the RIGHT upper lobe (image 26/4) 2.9 x 3.6 as compared to 3.9 x 3.9 cm. Developing cavitation in an area just above the RIGHT hilum previously soft tissue density. This area measuring 2.1 x 1.5 cm previously imaged in the same plane as the mass measuring approximately 2.6 x 1.8 cm. Spiculation tracking towards the pleural surface. Mass extending into the RIGHT lung apex as on previous imaging. Discrete nodule in the RIGHT upper chest along the major fissure (image 37/4) much less conspicuous than on the prior exam measuring 8 x 5 mm previously 13 mm greatest axial dimension. Less soft tissue density associated with this area on the current study. No new areas of nodularity or masslike change in the chest. No pleural effusion. Musculoskeletal: See below for full musculoskeletal details. Destructive changes in RIGHT anterolateral third rib similar to prior imaging. CT ABDOMEN PELVIS FINDINGS Hepatobiliary: Adrenal and hepatic involvement from adrenal metastasis with marked interval improvement. Adrenal mass at 4.0 x 3.2 cm (image 39/2) previously 5.1 x 4.5 cm. Area of greatest hepatic involvement in hepatic subsegment VII just behind the superior vena cava (image 47/2) 2.4 x 1.9 cm previously 4.7 cm greatest axial dimension. Small area of RIGHT posterior hepatic involvement (image 49/2) 12 mm, this showed  direct extension from the tumor in the adrenal on the prior exam and is of similar size following regression of the adrenal and additional hepatic changes. No additional hepatic lesions. No pericholecystic stranding. No biliary duct dilation. Portal vein is patent. Pancreas: Normal, without mass, inflammation or ductal dilatation. Spleen: Normal. Adrenals/Urinary Tract: RIGHT adrenal mass as discussed. LEFT adrenal unremarkable. No signs of hydronephrosis or evidence of perinephric stranding. No perivesical stranding. Stomach/Bowel: No acute gastrointestinal process. Vascular/Lymphatic: Aortic atherosclerosis. No sign of aneurysm. Smooth contour of the IVC. There is no gastrohepatic or hepatoduodenal ligament lymphadenopathy. No retroperitoneal or mesenteric lymphadenopathy. No pelvic sidewall lymphadenopathy. Atherosclerotic changes are moderate to marked with calcification throughout the abdominal aorta. Reproductive: Unremarkable by CT. Other: No ascites. Musculoskeletal: Signs of thoracic compression fractures at the  T4, T8 and T12 levels without change. Sclerosis at T4 suggests underlying metastatic process. Other levels show no change over time but again are suggestive of pathologic compression fractures. No change the appearance of these vertebral levels since at least June of 2023. RIGHT rib destruction similar to prior imaging. Signs of chronic sternal fracture also similar to prior imaging. IMPRESSION: 1. Interval response to therapy with respect to the RIGHT upper lobe mass and RIGHT upper lobe nodule. 2. Marked interval improvement with respect to RIGHT adrenal and hepatic metastatic disease. 3. Signs of probable pathologic compression fractures at T4, T8 and T12 levels without change. 4. Signs of rib destruction and sternal fracture with chronic appearance, unchanged. 5. Stable dilation of the ascending thoracic aorta to 4.2 cm. Consider attention on follow-up or dedicated annual follow-up imaging as  warranted. 6. Aortic atherosclerosis. Aortic Atherosclerosis (ICD10-I70.0). Electronically Signed   By: Zetta Bills M.D.   On: 03/15/2022 11:07   CT Abdomen Pelvis W Contrast  Result Date: 03/15/2022 CLINICAL DATA:  Non-small cell lung cancer staging in a 76 year old female. * Tracking Code: BO * EXAM: CT CHEST, ABDOMEN, AND PELVIS WITH CONTRAST TECHNIQUE: Multidetector CT imaging of the chest, abdomen and pelvis was performed following the standard protocol during bolus administration of intravenous contrast. RADIATION DOSE REDUCTION: This exam was performed according to the departmental dose-optimization program which includes automated exposure control, adjustment of the mA and/or kV according to patient size and/or use of iterative reconstruction technique. CONTRAST:  67m OMNIPAQUE IOHEXOL 300 MG/ML  SOLN COMPARISON:  January 07, 2022. FINDINGS: CT CHEST FINDINGS Cardiovascular: Calcified aortic atherosclerotic changes. Stable dilation of the thoracic aorta to 4.2 cm. Normal heart size. No pericardial effusion or nodularity. Normal caliber of central pulmonary vessels. Mediastinum/Nodes: No thoracic inlet, axillary, mediastinal or hilar adenopathy. Esophagus grossly normal. Lungs/Pleura: Mass in the RIGHT upper lobe (image 26/4) 2.9 x 3.6 as compared to 3.9 x 3.9 cm. Developing cavitation in an area just above the RIGHT hilum previously soft tissue density. This area measuring 2.1 x 1.5 cm previously imaged in the same plane as the mass measuring approximately 2.6 x 1.8 cm. Spiculation tracking towards the pleural surface. Mass extending into the RIGHT lung apex as on previous imaging. Discrete nodule in the RIGHT upper chest along the major fissure (image 37/4) much less conspicuous than on the prior exam measuring 8 x 5 mm previously 13 mm greatest axial dimension. Less soft tissue density associated with this area on the current study. No new areas of nodularity or masslike change in the chest. No  pleural effusion. Musculoskeletal: See below for full musculoskeletal details. Destructive changes in RIGHT anterolateral third rib similar to prior imaging. CT ABDOMEN PELVIS FINDINGS Hepatobiliary: Adrenal and hepatic involvement from adrenal metastasis with marked interval improvement. Adrenal mass at 4.0 x 3.2 cm (image 39/2) previously 5.1 x 4.5 cm. Area of greatest hepatic involvement in hepatic subsegment VII just behind the superior vena cava (image 47/2) 2.4 x 1.9 cm previously 4.7 cm greatest axial dimension. Small area of RIGHT posterior hepatic involvement (image 49/2) 12 mm, this showed direct extension from the tumor in the adrenal on the prior exam and is of similar size following regression of the adrenal and additional hepatic changes. No additional hepatic lesions. No pericholecystic stranding. No biliary duct dilation. Portal vein is patent. Pancreas: Normal, without mass, inflammation or ductal dilatation. Spleen: Normal. Adrenals/Urinary Tract: RIGHT adrenal mass as discussed. LEFT adrenal unremarkable. No signs of hydronephrosis or evidence of perinephric  stranding. No perivesical stranding. Stomach/Bowel: No acute gastrointestinal process. Vascular/Lymphatic: Aortic atherosclerosis. No sign of aneurysm. Smooth contour of the IVC. There is no gastrohepatic or hepatoduodenal ligament lymphadenopathy. No retroperitoneal or mesenteric lymphadenopathy. No pelvic sidewall lymphadenopathy. Atherosclerotic changes are moderate to marked with calcification throughout the abdominal aorta. Reproductive: Unremarkable by CT. Other: No ascites. Musculoskeletal: Signs of thoracic compression fractures at the T4, T8 and T12 levels without change. Sclerosis at T4 suggests underlying metastatic process. Other levels show no change over time but again are suggestive of pathologic compression fractures. No change the appearance of these vertebral levels since at least June of 2023. RIGHT rib destruction similar  to prior imaging. Signs of chronic sternal fracture also similar to prior imaging. IMPRESSION: 1. Interval response to therapy with respect to the RIGHT upper lobe mass and RIGHT upper lobe nodule. 2. Marked interval improvement with respect to RIGHT adrenal and hepatic metastatic disease. 3. Signs of probable pathologic compression fractures at T4, T8 and T12 levels without change. 4. Signs of rib destruction and sternal fracture with chronic appearance, unchanged. 5. Stable dilation of the ascending thoracic aorta to 4.2 cm. Consider attention on follow-up or dedicated annual follow-up imaging as warranted. 6. Aortic atherosclerosis. Aortic Atherosclerosis (ICD10-I70.0). Electronically Signed   By: Zetta Bills M.D.   On: 03/15/2022 11:07    ASSESSMENT AND PLAN: This is a very pleasant 76 years old white female diagnosed with Stage IV (T3, N2, M1 C) non-small cell lung cancer with unknown histologic subtype, likely adenocarcinoma with positive KRAS G12C mutation.  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  Status post palliative radiotherapy to the painful bone lesions under the care of Dr. Sondra Come.  The patient started induction systemic chemotherapy with carboplatin for AUC of 5, Alimta 500 Mg/M2 and Keytruda 200 Mg IV every 3 weeks and currently on maintenance treatment with single agent Alimta after Keytruda was discontinued starting from cycle #7 because of significant arthralgia and myalgia.  She is status post 18 cycles of total treatment.  The patient has been tolerating this treatment well except for fatigue. She underwent palliative radiotherapy to enlarging pulmonary nodule under the care of Dr. Sondra Come. Unfortunately her repeat imaging studies with CT scan showed evidence for disease progression with increase in the size of the apical  right lung mass in addition to new and enlarging pulmonary metastasis as well as significant increase in the large right adrenal metastasis and new metastatic adenopathy to the retroperitoneum. The patient started treatment with Krazati (Adagrasib) 600 mg p.o. twice daily on January 17, 2022.  Status post 3 months of treatment. The patient has been tolerating her treatment well with no concerning adverse effects. I recommended for her to continue her current treatment with Kristi Jennings (Adagrasib) with the same dose. I will see her back for follow-up visit in 1 months for evaluation with repeat blood work. For the lack of appetite, she will continue on Marinol as well as Remeron and low-dose prednisone. For the bone metastasis, she will continue her current treatment with Zometa every 6 weeks. The patient was advised to call immediately if she has any other concerning symptoms in the interval. The patient voices understanding of current disease status and treatment options and is in agreement with the current care plan.  All questions were answered. The patient knows to call the clinic  with any problems, questions or concerns. We can certainly see the patient much sooner if necessary.  The total time spent in the appointment was 30 minutes.  Disclaimer: This note was dictated with voice recognition software. Similar sounding words can inadvertently be transcribed and may not be corrected upon review.

## 2022-05-01 ENCOUNTER — Other Ambulatory Visit: Payer: Self-pay | Admitting: Family Medicine

## 2022-05-04 ENCOUNTER — Telehealth: Payer: Self-pay | Admitting: Medical Oncology

## 2022-05-04 DIAGNOSIS — C3491 Malignant neoplasm of unspecified part of right bronchus or lung: Secondary | ICD-10-CM

## 2022-05-04 DIAGNOSIS — C7951 Secondary malignant neoplasm of bone: Secondary | ICD-10-CM

## 2022-05-04 MED ORDER — FOLIC ACID 1 MG PO TABS: 1.0000 mg | ORAL_TABLET | Freq: Every day | ORAL | 2 refills | Status: DC

## 2022-05-04 NOTE — Telephone Encounter (Signed)
Appts for January confirmed with [name]. Folic acid refill requested.

## 2022-05-08 DIAGNOSIS — C3491 Malignant neoplasm of unspecified part of right bronchus or lung: Secondary | ICD-10-CM | POA: Diagnosis not present

## 2022-05-10 ENCOUNTER — Encounter: Payer: Self-pay | Admitting: Nurse Practitioner

## 2022-05-10 ENCOUNTER — Telehealth: Payer: Self-pay | Admitting: Medical Oncology

## 2022-05-10 ENCOUNTER — Telehealth: Payer: Self-pay

## 2022-05-10 ENCOUNTER — Inpatient Hospital Stay (HOSPITAL_BASED_OUTPATIENT_CLINIC_OR_DEPARTMENT_OTHER): Payer: Medicare HMO | Admitting: Nurse Practitioner

## 2022-05-10 DIAGNOSIS — R530 Neoplastic (malignant) related fatigue: Secondary | ICD-10-CM

## 2022-05-10 DIAGNOSIS — R197 Diarrhea, unspecified: Secondary | ICD-10-CM

## 2022-05-10 DIAGNOSIS — G893 Neoplasm related pain (acute) (chronic): Secondary | ICD-10-CM | POA: Diagnosis not present

## 2022-05-10 DIAGNOSIS — R630 Anorexia: Secondary | ICD-10-CM

## 2022-05-10 NOTE — Telephone Encounter (Signed)
Pt husband craig called regarding pt diarrhea and lack of eating. This RN re-enforced the teaching of diane bell, Rn and [name],NP, pt to eat bland foods, drink plenty of fluids (water, gaterorade, pedialyte) and pt to take imodium. Notify office pt pt becomes worse or any questions arise. [name] voiced understanding, no further questions at this point.

## 2022-05-10 NOTE — Progress Notes (Unsigned)
[place]  [phone] [phone]) [phone]   Name: Kristi Jennings Date: 05/10/2022 MRN: [id number]  DOB: Oct 09, 1945  Patient Care Team: [name], NP as PCP - General (Family Medicine) [place] Registered Nurse [facility] and Palliative Medicine)   I connected with Kristi Jennings on 05/10/22 at  1:30 PM EST by phone and verified that I am speaking with the correct person using two identifiers.   I discussed the limitations, risks, security and privacy concerns of performing an evaluation and management service by telemedicine and the availability of in-person appointments. I also discussed with the patient that there may be a patient responsible charge related to this service. The patient expressed understanding and agreed to proceed.   Other persons participating in the visit and their role in the encounter: [place], RN    Patient's location: Home   Provider's location: [place]    Chief Complaint: Symptom Follow-Up   INTERVAL HISTORY: Kristi Jennings is a 76 y.o. 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 Palliative ask to see for symptom management.  SOCIAL HISTORY:     reports that she quit smoking about 13 years ago. Her smoking use included cigarettes. She has a 43.00 pack-year smoking history. She has never used smokeless tobacco. She reports that she does not currently use alcohol. She reports that she does not use drugs.  ADVANCE DIRECTIVES:  None on file   CODE STATUS:   PAST MEDICAL HISTORY: Past Medical History:  Diagnosis Date   Borderline diabetes mellitus    Bronchogenic cancer of right lung ([place])    Dyslipidemia    History of radiation therapy    right chest 12/21/2020-12/24/2020  Dr Gery Pray   History of radiation therapy    right lung 06/07/2021-06/17/2021  Dr Gery Pray    Hypertension    Polymyalgia rheumatica ([place])    ALLERGIES:  is allergic to shellfish-derived products and augmentin [amoxicillin-pot clavulanate].  MEDICATIONS:  Current Outpatient Medications  Medication Sig Dispense Refill   acetaminophen (TYLENOL) 650 MG CR tablet Take 1,300 mg by mouth 3 (three) times daily as needed for pain.     adagrasib (KRAZATI) 200 MG tablet Take 3 tablets (600 mg total) by mouth 2 (two) times daily. 180 tablet 0   atenolol (TENORMIN) 25 MG tablet Take 12.5 mg by mouth at bedtime.     Calcium-Vitamin D-Vitamin K (CALCIUM + D) [id number] MG-UNT-MCG CHEW      Cholecalciferol (VITAMIN D) 50 MCG (2000 UT) tablet      diclofenac Sodium (VOLTAREN) 1 % GEL Apply 4 g topically 4 (four) times daily. (Patient taking differently: Apply 4 g topically 4 (four) times daily as needed (pain).) 150 g 0   dronabinol (MARINOL) 10 MG capsule Take 1 capsule (10 mg total) by mouth 2 (two) times daily before lunch and supper. 60 capsule 0   enoxaparin (LOVENOX) 60 MG/0.6ML injection INJECT THE CONTENTS OF ONE SYRINGE UNDER THE SKIN ONE TIME DAILY 18 mL 2   escitalopram (LEXAPRO) 20 MG tablet TAKE ONE TABLET BY MOUTH AT BEDTIME 90 tablet 0   fexofenadine (ALLEGRA) 180 MG tablet Take 180 mg by mouth daily.     fluocinonide (LIDEX) 0.05 % external solution Apply topically daily.     folic acid (FOLVITE) 1 MG tablet Take 1 tablet (1 mg total) by mouth daily. [place]  tablet 2   HYDROmorphone (DILAUDID) 4 MG tablet Take 4 mg by mouth every 6 (six) hours as needed for severe pain. Quantity of 120 tablets     mirtazapine (REMERON) 15 MG tablet Take 15 mg by mouth at bedtime.     morphine (MS CONTIN) 30 MG 12 hr tablet Take 1 tablet (30 mg total) by mouth every 12 (twelve) hours. 60 tablet 0   Multiple Vitamin (MULTI VITAMIN) TABS      ondansetron (ZOFRAN-ODT) 4 MG disintegrating tablet Take 1 tablet (4 mg total) by mouth 2 (two) times daily as needed for nausea or vomiting. (Patient not taking:  Reported on 11/30/2021) 20 tablet 1   OVER THE COUNTER MEDICATION Take 2 Cans by mouth 2 (two) times daily as needed (for nutrition). Premier protein     predniSONE (DELTASONE) 1 MG tablet Take 3 mg by mouth daily.     predniSONE (DELTASONE) 5 MG tablet Take 5 mg by mouth daily. (Patient not taking: Reported on 2/35/5732)     Salicylic Acid (SCALPICIN EX) Apply 1 application. topically daily as needed.     senna-docusate (SENOKOT-S) 8.6-50 MG tablet Take 1 tablet by mouth 2 (two) times daily. 30 tablet 2   triamcinolone ointment (KENALOG) 0.1 % Apply 1 application. topically 2 (two) times daily.     No current facility-administered medications for this visit.    VITAL SIGNS: There were no vitals taken for this visit. There were no vitals filed for this visit.   Estimated body mass index is 13.91 kg/m as calculated from the following:   Height as of 03/16/22: 5\' 6"  (1.676 m).   Weight as of 04/25/22: 86 lb 3.2 oz (39.1 kg).  PERFORMANCE STATUS (ECOG) : 1 - Symptomatic but completely ambulatory   IMPRESSION:  I connected with Kristi Jennings by phone today for ongoing symptom management support. She shares she is not feeling her best today and is currently resting in bed. Over the past 2-3 days she has been experiencing diarrhea. Does not feel that it is something that she ate. Denies abdominal pain, fever, chills, nausea, or vomiting. Appetite is somewhat decreased. She has not taken anything for the diarrhea. Is trying to push fluids an soft diets.   We discussed eating BRAT diet and foods that will help with symptoms. Encouraged her to continue pushing fluids to include Gatorade or Pedialyte. Education provided on use of Immodium. She verbalized understanding.   Pain Kristi Jennings reports her pain remains well controlled. She is not requiring any breakthrough pain medications. Tolerating MS Contin every 12 hours.   We discussed plans to re-attempt weaning down MS Contin in the near future.  She verbalized understanding. We discussed getting through the holidays and possibly attempting to decrease MS Contin in upcoming months.     Constipation No concerns with constipation.    Anxiety Her anxiety has decreased significantly mainly due to to her improved wellbeing and decreased pain.  She states she is focusing on continued improvement and hopes things remain stable.   Decreased appetite/Weight loss Appetite continues to fluctuate. Some days are better than others. Currently more decreased than usual due to symptoms.   She is tolerating dronabinol.  Her weight at previous visit decreased down to 86lbs from  89lbs 10/26, 87lbs on 9/26, 91lbs on 8/1.89 lbs 4/19,  88.8 lbs on 3/29, 90 lbs on 3/8,  88lbs 2/15, 89lbs on 1/25 and 92lb on 1/11.   PLAN: Continue MS Contin as prescribed. We will continue  to closely evaluate and began to wean down and eventually discontinue if possible.  Dronabinol 10 mg twice daily for appetite.  Push fluids to include Gatorade or Pedialyte Immodium for diarrhea  Ongoing symptom management (see above) will plan to re-attempt weaning of MS Contin given pain is decreasing. I will plan to see patient back in 4-6 weeks in collaboration with her other appointments.    Patient expressed understanding and was in agreement with this plan. She also understands that She can call the clinic at any time with any questions, concerns, or complaints.    Time Total: 35 min   Visit consisted of counseling and education dealing with the complex and emotionally intense issues of symptom management and palliative care in the setting of serious and potentially life-threatening illness.Greater than 50%  of this time was spent counseling and coordinating care related to the above assessment and plan.  Alda Lea, AGPCNP-BC  Palliative Medicine Team/[place]

## 2022-05-10 NOTE — Telephone Encounter (Signed)
Watery diarrhea x 2 days , 2-3 episodes /day .She is replenishing fluids as able. She stated she can "deal with it now". She stopped her Krazati yesterday. Denies N/V, fever, abd pain, weakness.   Per Dr Julien Nordmann I encouraged her to increase oral fluids , take Imodium AD after first diarrhea stool of the day then 1 tablet after each successive diarrhea stool , up to 6 tabs/day. HE said she can continue her [place]. She voiced understanding and will call if symptoms worsen.

## 2022-05-11 ENCOUNTER — Encounter: Payer: Self-pay | Admitting: Physician Assistant

## 2022-05-14 DIAGNOSIS — C3491 Malignant neoplasm of unspecified part of right bronchus or lung: Secondary | ICD-10-CM | POA: Diagnosis not present

## 2022-05-18 ENCOUNTER — Telehealth: Payer: Self-pay | Admitting: Internal Medicine

## 2022-05-18 NOTE — Telephone Encounter (Signed)
Called patient regarding January appointments, patient is notified.

## 2022-05-26 ENCOUNTER — Other Ambulatory Visit (HOSPITAL_COMMUNITY): Payer: Self-pay

## 2022-05-26 ENCOUNTER — Other Ambulatory Visit: Payer: Self-pay

## 2022-05-26 DIAGNOSIS — C3491 Malignant neoplasm of unspecified part of right bronchus or lung: Secondary | ICD-10-CM

## 2022-05-26 NOTE — Telephone Encounter (Signed)
Oral Oncology Patient Advocate Encounter  Received call from [employer] regarding status of patient's re-enrollment for PAP.  Per representative, new rx for Alfred Levins is needed at [facility] in [place], [place]. I have requested this be sent in when possible.  Additionally, the program requested copies of the patient's insurance cards. This has been faxed to the program. [phone]  I will continue to follow until final determination.  Lady Deutscher, CPhT-Adv Oncology Pharmacy Patient [place] Direct Number: [phone]  Fax: [phone]

## 2022-05-29 ENCOUNTER — Other Ambulatory Visit: Payer: Self-pay | Admitting: Internal Medicine

## 2022-05-29 DIAGNOSIS — C3491 Malignant neoplasm of unspecified part of right bronchus or lung: Secondary | ICD-10-CM

## 2022-05-29 MED ORDER — ADAGRASIB 200 MG PO TABS: 600.0000 mg | ORAL_TABLET | Freq: Two times a day (BID) | ORAL | 0 refills | Status: DC

## 2022-05-31 ENCOUNTER — Other Ambulatory Visit: Payer: Self-pay | Admitting: *Deleted

## 2022-05-31 ENCOUNTER — Telehealth: Payer: Self-pay | Admitting: Pharmacy Technician

## 2022-05-31 ENCOUNTER — Other Ambulatory Visit (HOSPITAL_COMMUNITY): Payer: Self-pay

## 2022-05-31 ENCOUNTER — Encounter: Payer: Self-pay | Admitting: Physician Assistant

## 2022-05-31 DIAGNOSIS — C3491 Malignant neoplasm of unspecified part of right bronchus or lung: Secondary | ICD-10-CM

## 2022-05-31 MED ORDER — ADAGRASIB 200 MG PO TABS
600.0000 mg | ORAL_TABLET | Freq: Two times a day (BID) | ORAL | 0 refills | Status: DC
  Filled 2022-05-31 – 2022-06-01 (×2): qty 180, 30d supply, fill #0

## 2022-05-31 NOTE — Telephone Encounter (Signed)
Per C. Medlin, CPhT: This patient is going to be switching to [facility] for their Alfred Levins going forward. Could you send a refill of the Krazati there please so I can get it set up?

## 2022-05-31 NOTE — Telephone Encounter (Signed)
Oral Oncology Patient Advocate Encounter   Was successful in securing patient a $5,000 grant from Patient [place] (PAF) to provide copayment coverage for [place].  This will keep the out of pocket expense at $0.     I have spoken with the patient.    The billing information is as follows and has been shared with [employer]   RxBin: [id number] PCN:  [id number] Member ID: [id number] Group ID: [id number] Dates of Eligibility: 12/02/21 through 05/31/23  Lady Deutscher, CPhT-Adv Oncology Pharmacy Patient [place] Direct Number: [phone]  Fax: [phone]

## 2022-05-31 NOTE — Telephone Encounter (Signed)
Oral Oncology Patient Advocate Encounter  Prior Authorization for Alfred Levins has been approved.    PA# [id number] Effective dates: 05/22/22 through 05/22/23  Patients co-pay is $3,328.34.    Lady Deutscher, CPhT-Adv Oncology Pharmacy Patient [place] Direct Number: [phone]  Fax: [phone]

## 2022-05-31 NOTE — Telephone Encounter (Signed)
Oral Oncology Patient [place] assistance is available for NSCLC. This available funding must be used before PAP can be re-enrolled.  I have spoken with the patient and the [employer] program.  I will re-open the case in the future if needed.  Kristi Jennings, CPhT-Adv Oncology Pharmacy Patient [place] Direct Number: [phone]  Fax: [phone]

## 2022-05-31 NOTE — Telephone Encounter (Signed)
Oral Oncology Patient Advocate Encounter   Was successful in securing patient an $4,100 grant from Patient [facility]) to provide copayment coverage for [place].  This will keep the out of pocket expense at $0.     I have spoken with the patient.    The billing information is as follows and has been shared with [place].   Member ID: [id number] Group ID: [id number] RxBin: [id number] Dates of Eligibility: 03/02/22 through 05/31/23  Fund:  [place], [place] Patient [place] Direct Number: [phone]  Fax: [phone]

## 2022-05-31 NOTE — Telephone Encounter (Signed)
Oral Oncology Patient Advocate Encounter   Received notification that prior authorization for Kristi Jennings is required.   PA submitted on 06/01/23 Key [id number] Status is pending     Lady Deutscher, CPhT-Adv Oncology Pharmacy Patient [place] Direct Number: [phone]  Fax: [phone]

## 2022-06-01 ENCOUNTER — Other Ambulatory Visit (HOSPITAL_COMMUNITY): Payer: Self-pay

## 2022-06-01 ENCOUNTER — Other Ambulatory Visit: Payer: Self-pay

## 2022-06-02 ENCOUNTER — Other Ambulatory Visit (HOSPITAL_COMMUNITY): Payer: Self-pay

## 2022-06-05 ENCOUNTER — Other Ambulatory Visit: Payer: Self-pay

## 2022-06-05 DIAGNOSIS — C7951 Secondary malignant neoplasm of bone: Secondary | ICD-10-CM

## 2022-06-05 DIAGNOSIS — C3491 Malignant neoplasm of unspecified part of right bronchus or lung: Secondary | ICD-10-CM

## 2022-06-05 MED ORDER — FOLIC ACID 1 MG PO TABS: 1.0000 mg | ORAL_TABLET | Freq: Every day | ORAL | 2 refills | Status: DC

## 2022-06-05 MED ORDER — MORPHINE SULFATE ER 30 MG PO TBCR: 30.0000 mg | EXTENDED_RELEASE_TABLET | Freq: Two times a day (BID) | ORAL | 0 refills | Status: DC

## 2022-06-06 ENCOUNTER — Other Ambulatory Visit (HOSPITAL_COMMUNITY): Payer: Self-pay

## 2022-06-06 ENCOUNTER — Telehealth: Payer: Self-pay

## 2022-06-06 NOTE — Telephone Encounter (Signed)
Pt husband called for refill, see new orders, no further needs at this time.

## 2022-06-08 ENCOUNTER — Other Ambulatory Visit: Payer: Self-pay

## 2022-06-08 ENCOUNTER — Inpatient Hospital Stay (HOSPITAL_BASED_OUTPATIENT_CLINIC_OR_DEPARTMENT_OTHER): Payer: Medicare HMO | Admitting: Nurse Practitioner

## 2022-06-08 ENCOUNTER — Inpatient Hospital Stay: Payer: Medicare HMO | Admitting: Internal Medicine

## 2022-06-08 ENCOUNTER — Encounter: Payer: Self-pay | Admitting: Nurse Practitioner

## 2022-06-08 ENCOUNTER — Inpatient Hospital Stay: Payer: Medicare HMO | Attending: Nurse Practitioner

## 2022-06-08 VITALS — BP 162/77 | HR 52 | Temp 98.3°F | Resp 15 | Wt 89.3 lb

## 2022-06-08 DIAGNOSIS — G893 Neoplasm related pain (acute) (chronic): Secondary | ICD-10-CM

## 2022-06-08 DIAGNOSIS — C3490 Malignant neoplasm of unspecified part of unspecified bronchus or lung: Secondary | ICD-10-CM | POA: Diagnosis not present

## 2022-06-08 DIAGNOSIS — N289 Disorder of kidney and ureter, unspecified: Secondary | ICD-10-CM | POA: Diagnosis not present

## 2022-06-08 DIAGNOSIS — Z515 Encounter for palliative care: Secondary | ICD-10-CM

## 2022-06-08 DIAGNOSIS — I10 Essential (primary) hypertension: Secondary | ICD-10-CM | POA: Diagnosis not present

## 2022-06-08 DIAGNOSIS — R530 Neoplastic (malignant) related fatigue: Secondary | ICD-10-CM

## 2022-06-08 DIAGNOSIS — C7971 Secondary malignant neoplasm of right adrenal gland: Secondary | ICD-10-CM | POA: Diagnosis not present

## 2022-06-08 DIAGNOSIS — R5383 Other fatigue: Secondary | ICD-10-CM | POA: Insufficient documentation

## 2022-06-08 DIAGNOSIS — C7951 Secondary malignant neoplasm of bone: Secondary | ICD-10-CM | POA: Insufficient documentation

## 2022-06-08 DIAGNOSIS — C3491 Malignant neoplasm of unspecified part of right bronchus or lung: Secondary | ICD-10-CM | POA: Diagnosis not present

## 2022-06-08 DIAGNOSIS — Z86718 Personal history of other venous thrombosis and embolism: Secondary | ICD-10-CM | POA: Insufficient documentation

## 2022-06-08 DIAGNOSIS — C3411 Malignant neoplasm of upper lobe, right bronchus or lung: Secondary | ICD-10-CM | POA: Insufficient documentation

## 2022-06-08 DIAGNOSIS — R630 Anorexia: Secondary | ICD-10-CM | POA: Diagnosis not present

## 2022-06-08 LAB — CMP (CANCER CENTER ONLY)
ALT: 14 U/L (ref 0–44)
AST: 31 U/L (ref 15–41)
Albumin: 3.9 g/dL (ref 3.5–5.0)
Alkaline Phosphatase: 58 U/L (ref 38–126)
Anion gap: 5 (ref 5–15)
BUN: 26 mg/dL — ABNORMAL HIGH (ref 8–23)
CO2: 33 mmol/L — ABNORMAL HIGH (ref 22–32)
Calcium: 9.3 mg/dL (ref 8.9–10.3)
Chloride: 100 mmol/L (ref 98–111)
Creatinine: 1.51 mg/dL — ABNORMAL HIGH (ref 0.44–1.00)
GFR, Estimated: 36 mL/min — ABNORMAL LOW (ref >60)
Glucose, Bld: 92 mg/dL (ref 70–99)
Potassium: 4.1 mmol/L (ref 3.5–5.1)
Sodium: 138 mmol/L (ref 135–145)
Total Bilirubin: 0.6 mg/dL (ref 0.3–1.2)
Total Protein: 6.6 g/dL (ref 6.5–8.1)

## 2022-06-08 LAB — CBC WITH DIFFERENTIAL (CANCER CENTER ONLY)
Abs Immature Granulocytes: 0.08 10*3/uL — ABNORMAL HIGH (ref 0.00–0.07)
Basophils Absolute: 0 10*3/uL (ref 0.0–0.1)
Basophils Relative: 1 %
Eosinophils Absolute: 0.1 10*3/uL (ref 0.0–0.5)
Eosinophils Relative: 1 %
HCT: 34.5 % — ABNORMAL LOW (ref 36.0–46.0)
Hemoglobin: 11.4 g/dL — ABNORMAL LOW (ref 12.0–15.0)
Immature Granulocytes: 1 %
Lymphocytes Relative: 18 %
Lymphs Abs: 1.1 10*3/uL (ref 0.7–4.0)
MCH: 29.9 pg (ref 26.0–34.0)
MCHC: 33 g/dL (ref 30.0–36.0)
MCV: 90.6 fL (ref 80.0–100.0)
Monocytes Absolute: 0.8 10*3/uL (ref 0.1–1.0)
Monocytes Relative: 12 %
Neutro Abs: 4.4 10*3/uL (ref 1.7–7.7)
Neutrophils Relative %: 67 %
Platelet Count: 166 10*3/uL (ref 150–400)
RBC: 3.81 MIL/uL — ABNORMAL LOW (ref 3.87–5.11)
RDW: 15.2 % (ref 11.5–15.5)
WBC Count: 6.5 10*3/uL (ref 4.0–10.5)
nRBC: 0 % (ref 0.0–0.2)

## 2022-06-08 NOTE — Progress Notes (Signed)
Palliative Medicine [facility]  Telephone:(336) [phone] [phone]) [phone]   Name: Kristi Jennings Date: 06/08/2022 MRN: [id number]  DOB: 1945/11/11  Patient Care Team: [name], NP as PCP - General (Family Medicine) [place] as Registered Nurse [facility] and Palliative Medicine)     INTERVAL HISTORY: Kristi Jennings is a 77 y.o. 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 Palliative ask to see for symptom management.  SOCIAL HISTORY:     reports that she quit smoking about 13 years ago. Her smoking use included cigarettes. She has a 43.00 pack-year smoking history. She has never used smokeless tobacco. She reports that she does not currently use alcohol. She reports that she does not use drugs.  ADVANCE DIRECTIVES:  None on file   CODE STATUS:   PAST MEDICAL HISTORY: Past Medical History:  Diagnosis Date   Borderline diabetes mellitus    Bronchogenic cancer of right lung ([facility])    Dyslipidemia    History of radiation therapy    right chest 12/21/2020-12/24/2020  Dr Antony Blackbird   History of radiation therapy    right lung 06/07/2021-06/17/2021  Dr Antony Blackbird   Hypertension    Polymyalgia rheumatica ([facility])    ALLERGIES:  is allergic to shellfish-derived products and augmentin [amoxicillin-pot clavulanate].  MEDICATIONS:  Current Outpatient Medications  Medication Sig Dispense Refill   acetaminophen (TYLENOL) 650 MG CR tablet Take 1,300 mg by mouth 3 (three) times daily as needed for pain.     adagrasib (KRAZATI) 200 MG tablet Take 3 tablets (600 mg total) by mouth 2 (two) times daily. 180 tablet 0   atenolol (TENORMIN) 25 MG tablet Take 12.5 mg by mouth at bedtime.     Calcium-Vitamin D-Vitamin K (CALCIUM + D) [phone] MG-UNT-MCG CHEW      Cholecalciferol (VITAMIN D) 50 MCG (2000 UT) tablet      diclofenac Sodium (VOLTAREN) 1 %  GEL Apply 4 g topically 4 (four) times daily. (Patient taking differently: Apply 4 g topically 4 (four) times daily as needed (pain).) 150 g 0   dronabinol (MARINOL) 10 MG capsule Take 1 capsule (10 mg total) by mouth 2 (two) times daily before lunch and supper. 60 capsule 0   enoxaparin (LOVENOX) 60 MG/0.6ML injection INJECT THE CONTENTS OF ONE SYRINGE UNDER THE SKIN ONE TIME DAILY 18 mL 2   escitalopram (LEXAPRO) 20 MG tablet TAKE ONE TABLET BY MOUTH AT BEDTIME 90 tablet 0   fexofenadine (ALLEGRA) 180 MG tablet Take 180 mg by mouth daily.     fluocinonide (LIDEX) 0.05 % external solution Apply topically daily.     folic acid (FOLVITE) 1 MG tablet Take 1 tablet (1 mg total) by mouth daily. 30 tablet 2   HYDROmorphone (DILAUDID) 4 MG tablet Take 4 mg by mouth every 6 (six) hours as needed for severe pain. Quantity of 120 tablets     mirtazapine (REMERON) 15 MG tablet Take 15 mg by mouth at bedtime.     morphine (MS CONTIN) 30 MG 12 hr tablet Take 1 tablet (30 mg total) by mouth every 12 (twelve) hours. 60 tablet 0   Multiple Vitamin (MULTI VITAMIN) TABS      ondansetron (ZOFRAN-ODT) 4 MG disintegrating tablet Take 1 tablet (4 mg total) by mouth 2 (two) times daily as needed for nausea or vomiting. (Patient not taking: Reported on 11/30/2021) 20 tablet 1  OVER THE COUNTER MEDICATION Take 2 Cans by mouth 2 (two) times daily as needed (for nutrition). Premier protein     predniSONE (DELTASONE) 1 MG tablet Take 3 mg by mouth daily.     predniSONE (DELTASONE) 5 MG tablet Take 5 mg by mouth daily. (Patient not taking: Reported on 02/14/2022)     Salicylic Acid (SCALPICIN EX) Apply 1 application. topically daily as needed.     senna-docusate (SENOKOT-S) 8.6-50 MG tablet Take 1 tablet by mouth 2 (two) times daily. 30 tablet 2   triamcinolone ointment (KENALOG) 0.1 % Apply 1 application. topically 2 (two) times daily.     No current facility-administered medications for this visit.    VITAL SIGNS: There  were no vitals taken for this visit. There were no vitals filed for this visit.   Estimated body mass index is 13.91 kg/m as calculated from the following:   Height as of 03/16/22: 5\' 6"  (1.676 m).   Weight as of 04/25/22: 86 lb 3.2 oz (39.1 kg).  PERFORMANCE STATUS (ECOG) : 1 - Symptomatic but completely ambulatory  Assessment NAD, ambulatory RRR Normal breathing pattern AAO x3  IMPRESSION:  Kristi Jennings presents to clinic today for follow-up. Her husband is also present. No acute distress. She is spending more time at home. Shares she does not feel as active in the winter months. Acknowledges she has not been as active as she would like. Husband expresses some short-term memory deficit at times.   Pain Kristi Jennings's pain is well controlled on current regimen. Tolerating MS Contin as prescribed. Does not require breakthrough medication. Some increased in aches given cooler temperatures.   We discussed plans to re-attempt weaning down MS Contin in the near future. She verbalized understanding. Will continue to closely monitor and discuss in the upcoming weeks.   Constipation Controlled with diet and daily regimen.   Decreased appetite/Weight loss  Appetite fluctuates. Some days better than others. Does feel that is has improved over the past several weeks. Tolerating marinol. She expresses appreciation of her weight increase. Current weight is 89lbs up from 86lbs on 12/5.    PLAN: Continue MS Contin as prescribed. We will continue to closely evaluate and began to wean down and eventually discontinue if possible.  Dronabinol 10 mg twice daily for appetite.  I will plan to see patient back in 4-6 weeks in collaboration with her other appointments.    Patient expressed understanding and was in agreement with this plan. She also understands that She can call the clinic at any time with any questions, concerns, or complaints.      Any controlled substances utilized were prescribed  in the context of palliative care. PDMP has been reviewed.    Time Total: 45 min   Visit consisted of counseling and education dealing with the complex and emotionally intense issues of symptom management and palliative care in the setting of serious and potentially life-threatening illness.Greater than 50%  of this time was spent counseling and coordinating care related to the above assessment and plan.  14/5, AGPCNP-BC  Palliative Medicine Team/[facility]

## 2022-06-08 NOTE — Progress Notes (Signed)
[facility] [phone]   [phone]  OFFICE PROGRESS NOTE  [name], NP [address] [place] [address]  DIAGNOSIS: Stage IV (T3, N2, M1 C) non-small cell lung cancer with unknown histologic subtype.  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   DETECTED ALTERATION(S) / BIOMARKER(S)      % CFDNA OR AMPLIFICATION        ASSOCIATED FDA-APPROVED THERAPIES         CLINICAL TRIAL AVAILABILITY [id number] 8.9%   Sotorasib Yes [id number] 7.2% None     Yes     PRIOR THERAPY:   1) Palliative radiotherapy to the painful bone lesions under the care of Dr. Roselind Messier.  2) Palliative systemic chemotherapy with carboplatin for AUC of 5, Alimta 500 Mg/M2 and Keytruda 200 Mg IV every 3 weeks.  First dose of treatment on December 29, 2020. Status post 18  cycles.  Starting from cycle #5, the patient is going to start maintenance Alimta and Keytruda. Keytruda discontinued from cycle #7 due to myalgias.  Last cycle was given on December 20, 2021 discontinued secondary to disease progression. 3) Possible palliative radiation to the enlarging pulmonary mass under the care of Dr. Roselind Messier.   CURRENT THERAPY: 1) Krazati (Adagrasib) 600 Mg p.o. twice daily.  First dose started January 17, 2022.  Status post 5 months of treatment. 2) Zometa every 6 weeks starting on 02/10/21   INTERVAL HISTORY: Kristi Jennings 77 y.o. female returns to the clinic today for follow-up visit accompanied by her husband.  The patient is feeling fine today with no concerning complaints except for mild fatigue and depression with the winter weather.  She denied having any current chest pain, shortness of breath, cough or hemoptysis.  She has no nausea, vomiting, diarrhea or constipation.  She has no headache or visual  changes.  She denied having any pain.  She has been tolerating her treatment with Caryn Section (Adagrasib) fairly well.  The patient is here today for evaluation and repeat blood work.   MEDICAL HISTORY: Past Medical History:  Diagnosis Date   Borderline diabetes mellitus    Bronchogenic cancer of right lung (HCC)    Dyslipidemia    History of radiation therapy    right chest 12/21/2020-12/24/2020  Dr Antony Blackbird   History of radiation therapy    right lung 06/07/2021-06/17/2021  Dr Antony Blackbird   Hypertension    Polymyalgia rheumatica (HCC)     ALLERGIES:  is allergic to shellfish-derived products and augmentin [amoxicillin-pot clavulanate].  MEDICATIONS:  Current Outpatient Medications  Medication Sig Dispense Refill   acetaminophen (TYLENOL) 650 MG CR tablet Take 1,300 mg by mouth 3 (three) times daily as needed for pain.     adagrasib (KRAZATI) 200 MG tablet Take 3 tablets (600 mg total) by mouth 2 (two) times daily. 180 tablet 0   atenolol (TENORMIN) 25 MG tablet Take 12.5 mg by mouth at bedtime.     Calcium-Vitamin D-Vitamin K (CALCIUM + D) [id number] MG-UNT-MCG CHEW      Cholecalciferol (VITAMIN D) 50 MCG (2000 UT) tablet      diclofenac Sodium (VOLTAREN) 1 % GEL Apply 4 g topically 4 (four) times daily. (Patient taking differently: Apply 4 g topically 4 (four) times daily as needed (pain).) 150 g 0  dronabinol (MARINOL) 10 MG capsule Take 1 capsule (10 mg total) by mouth 2 (two) times daily before lunch and supper. 60 capsule 0   enoxaparin (LOVENOX) 60 MG/0.6ML injection INJECT THE CONTENTS OF ONE SYRINGE UNDER THE SKIN ONE TIME DAILY 18 mL 2   escitalopram (LEXAPRO) 20 MG tablet TAKE ONE TABLET BY MOUTH AT BEDTIME 90 tablet 0   fexofenadine (ALLEGRA) 180 MG tablet Take 180 mg by mouth daily.     fluocinonide (LIDEX) 0.05 % external solution Apply topically daily.     folic acid (FOLVITE) 1 MG tablet Take 1 tablet (1 mg total) by mouth daily. 30 tablet 2   HYDROmorphone (DILAUDID)  4 MG tablet Take 4 mg by mouth every 6 (six) hours as needed for severe pain. Quantity of 120 tablets     mirtazapine (REMERON) 15 MG tablet Take 15 mg by mouth at bedtime.     morphine (MS CONTIN) 30 MG 12 hr tablet Take 1 tablet (30 mg total) by mouth every 12 (twelve) hours. 60 tablet 0   Multiple Vitamin (MULTI VITAMIN) TABS      ondansetron (ZOFRAN-ODT) 4 MG disintegrating tablet Take 1 tablet (4 mg total) by mouth 2 (two) times daily as needed for nausea or vomiting. (Patient not taking: Reported on 11/30/2021) 20 tablet 1   OVER THE COUNTER MEDICATION Take 2 Cans by mouth 2 (two) times daily as needed (for nutrition). Premier protein     predniSONE (DELTASONE) 1 MG tablet Take 3 mg by mouth daily.     predniSONE (DELTASONE) 5 MG tablet Take 5 mg by mouth daily. (Patient not taking: Reported on 02/14/2022)     Salicylic Acid (SCALPICIN EX) Apply 1 application. topically daily as needed.     senna-docusate (SENOKOT-S) 8.6-50 MG tablet Take 1 tablet by mouth 2 (two) times daily. 30 tablet 2   triamcinolone ointment (KENALOG) 0.1 % Apply 1 application. topically 2 (two) times daily.     No current facility-administered medications for this visit.    SURGICAL HISTORY: No past surgical history on file.  REVIEW OF SYSTEMS:  A comprehensive review of systems was negative except for: Constitutional: positive for fatigue   PHYSICAL EXAMINATION: General appearance: alert, cooperative, fatigued, and no distress Head: Normocephalic, without obvious abnormality, atraumatic Neck: no adenopathy, no JVD, supple, symmetrical, trachea midline, and thyroid not enlarged, symmetric, no tenderness/mass/nodules Lymph nodes: Cervical, supraclavicular, and axillary nodes normal. Resp: clear to auscultation bilaterally Back: symmetric, no curvature. ROM normal. No CVA tenderness. Cardio: regular rate and rhythm, S1, S2 normal, no murmur, click, rub or gallop GI: soft, non-tender; bowel sounds normal; no masses,   no organomegaly Extremities: extremities normal, atraumatic, no cyanosis or edema  ECOG PERFORMANCE STATUS: 1 - Symptomatic but completely ambulatory  Blood pressure (!) 162/77, pulse (!) 52, temperature 98.3 F (36.8 C), temperature source Oral, resp. rate 15, weight 89 lb 4.8 oz (40.5 kg), SpO2 96 %.  LABORATORY DATA: Lab Results  Component Value Date   WBC 6.5 06/08/2022   HGB 11.4 (L) 06/08/2022   HCT 34.5 (L) 06/08/2022   MCV 90.6 06/08/2022   PLT 166 06/08/2022      Chemistry      Component Value Date/Time   NA 135 04/25/2022 1120   K 4.4 04/25/2022 1120   CL 98 04/25/2022 1120   CO2 33 (H) 04/25/2022 1120   BUN 19 04/25/2022 1120   CREATININE 1.09 (H) 04/25/2022 1120      Component Value Date/Time  CALCIUM 9.9 04/25/2022 1120   ALKPHOS 57 04/25/2022 1120   AST 33 04/25/2022 1120   ALT 17 04/25/2022 1120   BILITOT 0.6 04/25/2022 1120       RADIOGRAPHIC STUDIES: No results found.  ASSESSMENT AND PLAN: This is a very pleasant 77 years old white female diagnosed with Stage IV (T3, N2, M1 C) non-small cell lung cancer with unknown histologic subtype, likely adenocarcinoma with positive KRAS G12C mutation.  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  Status post palliative radiotherapy to the painful bone lesions under the care of Dr. Roselind Messier.  The patient started induction systemic chemotherapy with carboplatin for AUC of 5, Alimta 500 Mg/M2 and Keytruda 200 Mg IV every 3 weeks and currently on maintenance treatment with single agent Alimta after Keytruda was discontinued starting from cycle #7 because of significant arthralgia and myalgia.  She is status post 18 cycles of total treatment.  The patient has been tolerating this treatment well except for fatigue. She underwent palliative radiotherapy to enlarging  pulmonary nodule under the care of Dr. Roselind Messier. Unfortunately her repeat imaging studies with CT scan showed evidence for disease progression with increase in the size of the apical right lung mass in addition to new and enlarging pulmonary metastasis as well as significant increase in the large right adrenal metastasis and new metastatic adenopathy to the retroperitoneum. The patient started treatment with Krazati (Adagrasib) 600 mg p.o. twice daily on January 17, 2022.  Status post 5 months of treatment. The patient has been tolerating this treatment well with no concerning adverse effect except for mild fatigue. I recommended for her to continue her current treatment with Caryn Section (Adagrasib) with the same dose for now. I will see her back for follow-up visit in 1 months for evaluation with repeat CT scan of the chest, abdomen and pelvis for restaging of her disease. For the lack of appetite, she is currently on Marinol and Remeron. For the history of deep venous thrombosis she would like to switch back to oral anticoagulant.  She still have 2 weeks of Lovenox available at home.  They will call before they run out of the Lovenox to switch her to Eliquis 5 mg p.o. twice daily. For the bone metastasis, she is currently on Zometa every 6 weeks. For the renal insufficiency, I encouraged her to increase her oral intake of fluid and will continue to monitor it closely for now. The patient was advised to call immediately if she has any other concerning symptoms in the interval. The patient voices understanding of current disease status and treatment options and is in agreement with the current care plan.  All questions were answered. The patient knows to call the clinic with any problems, questions or concerns. We can certainly see the patient much sooner if necessary.  The total time spent in the appointment was 20 minutes.  Disclaimer: This note was dictated with voice recognition software. Similar  sounding words can inadvertently be transcribed and may not be corrected upon review.

## 2022-06-12 ENCOUNTER — Telehealth: Payer: Self-pay

## 2022-06-12 DIAGNOSIS — I10 Essential (primary) hypertension: Secondary | ICD-10-CM | POA: Diagnosis not present

## 2022-06-12 NOTE — Telephone Encounter (Signed)
Spoke with patients husband [name] regarding appointments. Confirmed appointments for 07/04/2022 and 07/06/2022. He also asked about when her CT scan needs to be scheduled and this LPN recommended for it to be done 07/04/2022. He verbalized understanding. No further questions or concerns.

## 2022-06-14 ENCOUNTER — Telehealth: Payer: Self-pay | Admitting: Internal Medicine

## 2022-06-14 DIAGNOSIS — C3491 Malignant neoplasm of unspecified part of right bronchus or lung: Secondary | ICD-10-CM | POA: Diagnosis not present

## 2022-06-14 NOTE — Telephone Encounter (Signed)
Called patient regarding upcoming February appointments, left a voicemail.

## 2022-06-16 ENCOUNTER — Other Ambulatory Visit: Payer: Self-pay | Admitting: Physician Assistant

## 2022-06-16 ENCOUNTER — Telehealth: Payer: Self-pay

## 2022-06-16 DIAGNOSIS — R413 Other amnesia: Secondary | ICD-10-CM

## 2022-06-16 DIAGNOSIS — C3491 Malignant neoplasm of unspecified part of right bronchus or lung: Secondary | ICD-10-CM

## 2022-06-16 NOTE — Telephone Encounter (Signed)
Pt and husband called reporting changes in memory for the pt, which have been going on for months ut have progressively gotten worse. pt vocalized frustration and confusion. RN confirmed that pt was doing well otherwise and taking all medications as prescribed, brain MRI entered per cassie, pt to see [name] on Monday. RN educated on s/s of stroke or other neurological symptoms that would warrant family calling emergency services. Pt husband verbalized understanding of education and changed in schedule.

## 2022-06-19 ENCOUNTER — Inpatient Hospital Stay (HOSPITAL_BASED_OUTPATIENT_CLINIC_OR_DEPARTMENT_OTHER): Payer: Medicare HMO | Admitting: Nurse Practitioner

## 2022-06-19 ENCOUNTER — Other Ambulatory Visit: Payer: Self-pay

## 2022-06-19 VITALS — BP 151/72 | HR 58 | Temp 98.0°F | Resp 16 | Wt 89.5 lb

## 2022-06-19 DIAGNOSIS — Z515 Encounter for palliative care: Secondary | ICD-10-CM

## 2022-06-19 DIAGNOSIS — F32A Depression, unspecified: Secondary | ICD-10-CM

## 2022-06-19 DIAGNOSIS — R69 Illness, unspecified: Secondary | ICD-10-CM | POA: Diagnosis not present

## 2022-06-19 DIAGNOSIS — R530 Neoplastic (malignant) related fatigue: Secondary | ICD-10-CM | POA: Diagnosis not present

## 2022-06-19 DIAGNOSIS — G893 Neoplasm related pain (acute) (chronic): Secondary | ICD-10-CM | POA: Diagnosis not present

## 2022-06-19 DIAGNOSIS — R630 Anorexia: Secondary | ICD-10-CM

## 2022-06-19 NOTE — Progress Notes (Signed)
[place]  Telephone:([phone] Fax:(336) [phone]   Name: Kristi Jennings Date: 06/19/2022 MRN: [id number]  DOB: 03/17/1946  Patient Care Team: [name], NP as PCP - General (Family Medicine) [facility] Registered Nurse [facility] and Palliative Medicine)     INTERVAL HISTORY: Kristi Jennings is a 77 y.o. 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 Palliative ask to see for symptom management.  SOCIAL HISTORY:     reports that she quit smoking about 13 years ago. Her smoking use included cigarettes. She has a 43.00 pack-year smoking history. She has never used smokeless tobacco. She reports that she does not currently use alcohol. She reports that she does not use drugs.  ADVANCE DIRECTIVES:  None on file   CODE STATUS:   PAST MEDICAL HISTORY: Past Medical History:  Diagnosis Date   Borderline diabetes mellitus    Bronchogenic cancer of right lung ([place])    Dyslipidemia    History of radiation therapy    right chest 12/21/2020-12/24/2020  Dr Gery Pray   History of radiation therapy    right lung 06/07/2021-06/17/2021  Dr Gery Pray   Hypertension    Polymyalgia rheumatica ([place])    ALLERGIES:  is allergic to shellfish-derived products and augmentin [amoxicillin-pot clavulanate].  MEDICATIONS:  Current Outpatient Medications  Medication Sig Dispense Refill   acetaminophen (TYLENOL) 650 MG CR tablet Take 1,300 mg by mouth 3 (three) times daily as needed for pain.     adagrasib (KRAZATI) 200 MG tablet Take 3 tablets (600 mg total) by mouth 2 (two) times daily. 180 tablet 0   atenolol (TENORMIN) 25 MG tablet Take 12.5 mg by mouth at bedtime.     Calcium-Vitamin D-Vitamin K (CALCIUM + D) [phone] MG-UNT-MCG CHEW      Cholecalciferol (VITAMIN D) 50 MCG (2000 UT) tablet      diclofenac Sodium (VOLTAREN) 1 %  GEL Apply 4 g topically 4 (four) times daily. (Patient taking differently: Apply 4 g topically 4 (four) times daily as needed (pain).) 150 g 0   dronabinol (MARINOL) 10 MG capsule Take 1 capsule (10 mg total) by mouth 2 (two) times daily before lunch and supper. 60 capsule 0   enoxaparin (LOVENOX) 60 MG/0.6ML injection INJECT THE CONTENTS OF ONE SYRINGE UNDER THE SKIN ONE TIME DAILY 18 mL 2   escitalopram (LEXAPRO) 20 MG tablet TAKE ONE TABLET BY MOUTH AT BEDTIME 90 tablet 0   fexofenadine (ALLEGRA) 180 MG tablet Take 180 mg by mouth daily.     fluocinonide (LIDEX) 0.05 % external solution Apply topically daily.     folic acid (FOLVITE) 1 MG tablet Take 1 tablet (1 mg total) by mouth daily. 30 tablet 2   HYDROmorphone (DILAUDID) 4 MG tablet Take 4 mg by mouth every 6 (six) hours as needed for severe pain. Quantity of 120 tablets     mirtazapine (REMERON) 15 MG tablet Take 15 mg by mouth at bedtime.     morphine (MS CONTIN) 30 MG 12 hr tablet Take 1 tablet (30 mg total) by mouth every 12 (twelve) hours. 60 tablet 0   Multiple Vitamin (MULTI VITAMIN) TABS      ondansetron (ZOFRAN-ODT) 4 MG disintegrating tablet Take 1 tablet (4 mg total) by mouth 2 (two) times daily as needed for nausea or vomiting. 20 tablet 1   OVER THE COUNTER MEDICATION Take  2 Cans by mouth 2 (two) times daily as needed (for nutrition). Premier protein     predniSONE (DELTASONE) 1 MG tablet Take 3 mg by mouth daily.     predniSONE (DELTASONE) 5 MG tablet Take 5 mg by mouth daily.     Salicylic Acid (SCALPICIN EX) Apply 1 application. topically daily as needed.     senna-docusate (SENOKOT-S) 8.6-50 MG tablet Take 1 tablet by mouth 2 (two) times daily. 30 tablet 2   triamcinolone ointment (KENALOG) 0.1 % Apply 1 application. topically 2 (two) times daily.     No current facility-administered medications for this visit.    VITAL SIGNS: There were no vitals taken for this visit. There were no vitals filed for this visit.    Estimated body mass index is 14.41 kg/m as calculated from the following:   Height as of 03/16/22: 5\' 6"  (1.676 m).   Weight as of 06/08/22: 89 lb 4.8 oz (40.5 kg).  PERFORMANCE STATUS (ECOG) : 1 - Symptomatic but completely ambulatory  Assessment NAD, ambulatory RRR Normal breathing pattern AAO x3, emotional   IMPRESSION:  [name] presents to clinic today for follow-up. Her husband is also present. Patient shares concerns with short-term memory loss. No acute distress noted. Is trying to remain as active as possible. Shares she was able to go out to eat with some friends over the weekend.   We discussed concerns for short-term memory loss. She is emotional expressing on Friday she had an episode where she could not remember anything recent but could long-term and that became frustrating to her. Denies any other neurological symptoms. Endorses some feelings of being "down" due to being in the house for weeks with cold weather and frequent days of rain. Emotional support provided. She is sleeping well at night. Occasional day time naps. Is trying to do things around the home.   Lima states after Friday she felt better with no concerns since then. Husband states Friday was the first time since her diagnosis she has cried and fully expressed her emotions which he feels "needed to happen".   I created space and opportunity for patient to express her thoughts and feelings. We discussed ways to increase her mood. Discussed getting out of the house more and being engaged with friends and family, keeping blinds open during the day, crossword or jigsaw puzzles which she enjoys, and possibly joining support groups.   She is aware that per Cassie, PA they would like to have MRI of brain to rule out any abnormalities. We discussed reasoning to be on the safe side. She and husband verbalized understanding.   I completed a thorough medication review with patient and her husband. [name] is taking  mirtazapine 15 mg at bedtime, MS Contin 30 mg every 12 hours, and dronabinol 10 mg twice daily for appetite. She thinks one of her outside providers prescribed mirtazapine. Education provided on use of different medications over time could potentially cause memory issues and it would be most efficient to closely evaluated their usage and the need for decreasing. She and husband verbalized understanding.   Patient feels her pain is well controlled. She does not require breakthrough medication. We have discussed weaning her MS Contin over the next month. At this time I have recommended to decrease frequency to once daily at bedtime. She will continue mirtazapine. Recommended decreasing dronabinol to once daily. Will plan to decrease to 5mg  daily and possibly discontinuing vs mirtazapine pending on how patient feels over the next  1-2 weeks. Mrs. Boulier and her husband verbalized understanding and appreciation. They are aware to contact office if needed.   PLAN: Decrease MS Contin to 30mg  at bedtime only. We will continue to closely evaluate and began to wean down and eventually discontinue if possible.  Decrease Dronabinol 10 mg to once daily for appetite.  Continue mirtazapine 15 mg as prescribed We will continue to closely monitor.  I will plan to see patient back in 4-6 weeks in collaboration with her other appointments. We will plan for phone follow-up in 1 week.    Patient expressed understanding and was in agreement with this plan. She also understands that She can call the clinic at any time with any questions, concerns, or complaints.     Time Total: 65 min   Visit consisted of counseling and education dealing with the complex and emotionally intense issues of symptom management and palliative care in the setting of serious and potentially life-threatening illness.Greater than 50%  of this time was spent counseling and coordinating care related to the above assessment and plan.  Alda Lea, AGPCNP-BC  Palliative Medicine Team/[facility]

## 2022-06-19 NOTE — Patient Instructions (Addendum)
-  decrease your dronabinol (Marinol) to once a day in the morning - decrease your morphine (MS Contin) to once a day at night - keep taking the Remeron (mirtazapine)  - give [place] a call 2-3 days before you need a refill

## 2022-06-21 ENCOUNTER — Other Ambulatory Visit (HOSPITAL_COMMUNITY): Payer: Self-pay

## 2022-06-21 ENCOUNTER — Other Ambulatory Visit: Payer: Self-pay | Admitting: Internal Medicine

## 2022-06-21 DIAGNOSIS — C3491 Malignant neoplasm of unspecified part of right bronchus or lung: Secondary | ICD-10-CM

## 2022-06-21 MED ORDER — KRAZATI 200 MG PO TABS
600.0000 mg | ORAL_TABLET | Freq: Two times a day (BID) | ORAL | 0 refills | Status: DC
  Filled 2022-06-21: qty 180, 30d supply, fill #0

## 2022-06-22 ENCOUNTER — Other Ambulatory Visit (HOSPITAL_COMMUNITY): Payer: Self-pay

## 2022-06-26 ENCOUNTER — Other Ambulatory Visit: Payer: Self-pay

## 2022-06-27 ENCOUNTER — Other Ambulatory Visit (HOSPITAL_COMMUNITY): Payer: Self-pay

## 2022-06-28 ENCOUNTER — Telehealth: Payer: Self-pay | Admitting: Medical Oncology

## 2022-06-28 ENCOUNTER — Ambulatory Visit (HOSPITAL_COMMUNITY)
Admission: RE | Admit: 2022-06-28 | Discharge: 2022-06-28 | Disposition: A | Discharge status: Home / Self Care | Payer: Medicare HMO | Source: Ambulatory Visit | Attending: Physician Assistant | Admitting: Physician Assistant

## 2022-06-28 ENCOUNTER — Other Ambulatory Visit (HOSPITAL_COMMUNITY): Payer: Self-pay

## 2022-06-28 ENCOUNTER — Other Ambulatory Visit: Payer: Self-pay

## 2022-06-28 DIAGNOSIS — C3491 Malignant neoplasm of unspecified part of right bronchus or lung: Secondary | ICD-10-CM | POA: Insufficient documentation

## 2022-06-28 DIAGNOSIS — I2699 Other pulmonary embolism without acute cor pulmonale: Secondary | ICD-10-CM

## 2022-06-28 DIAGNOSIS — R413 Other amnesia: Secondary | ICD-10-CM | POA: Insufficient documentation

## 2022-06-28 DIAGNOSIS — I671 Cerebral aneurysm, nonruptured: Secondary | ICD-10-CM | POA: Diagnosis not present

## 2022-06-28 DIAGNOSIS — I6782 Cerebral ischemia: Secondary | ICD-10-CM | POA: Diagnosis not present

## 2022-06-28 MED ORDER — APIXABAN 2.5 MG PO TABS
2.5000 mg | ORAL_TABLET | Freq: Two times a day (BID) | ORAL | 1 refills | Status: DC
  Filled 2022-06-28 (×2): qty 60, 30d supply, fill #0
  Filled 2022-07-24: qty 60, 30d supply, fill #1

## 2022-06-28 MED ORDER — GADOBUTROL 1 MMOL/ML IV SOLN
5.0000 mL | Freq: Once | INTRAVENOUS | Status: AC | PRN
  Administered 2022-06-28: 5 mL via INTRAVENOUS

## 2022-06-28 NOTE — Telephone Encounter (Signed)
Kristi Jennings finished lovenox and is ready to start Xarelto.She did not finish the starter pack.

## 2022-06-28 NOTE — Telephone Encounter (Signed)
Eloquis dose reduced to 2. 5 mg bid due to increase concentration of the drug with Kraziti. Husband notified.

## 2022-06-29 ENCOUNTER — Telehealth: Payer: Self-pay

## 2022-06-29 NOTE — Telephone Encounter (Signed)
Oral Oncology Pharmacist Encounter  Received message from RN on 06/28/22 that patient and MD want patient to be on Eliquis but when they order a message with a drug interaction with Alfred Levins appears. Notified RN that krazati increases the concentration of eliquis and the eliquis dose needs to be reduced by 50%.   Drema Halon, PharmD Hematology/Oncology Clinical Pharmacist [facility] [phone]

## 2022-06-30 ENCOUNTER — Telehealth: Payer: Self-pay | Admitting: Physician Assistant

## 2022-06-30 NOTE — Telephone Encounter (Signed)
I called the patient to review her brain MRI. There was no evidence of metastatic disease to the brain. The patient had been endorsing cognitive and memory decline to her palliative care team. I have noticed at appointments that she seems to be forgetful of conversations we have had a prior appointments although this is not acutely new but felt to be worsening. Her scan showed moderate chronic microvascular ischemic changes and moderate to advanced parenchymal volume loss. Her scan incidentally noted 5 mm left posterior communicating artery aneurysm. Unclear if that is new or stability from prior neuroimaging. I called the patient to see if she was interested in referral to neurology for her cognitive decline and they can address if there is need for intervention of the 5 mm aneurysm she declined referral at this time. We reviewed symptoms of aneurysm rupture such as confusion, lethargy, headaches, speech changes, extremity weakness, etc she should seek medical evaluation.  She will talk to her husband when he returns home about if they should move forward to see neurologist. I also am scheduled to see them next week and we will discuss in more depth at that time.

## 2022-07-04 ENCOUNTER — Ambulatory Visit (HOSPITAL_COMMUNITY)
Admission: RE | Admit: 2022-07-04 | Discharge: 2022-07-04 | Disposition: A | Discharge status: Home / Self Care | Payer: Medicare HMO | Source: Ambulatory Visit | Attending: Internal Medicine | Admitting: Internal Medicine

## 2022-07-04 ENCOUNTER — Encounter (HOSPITAL_COMMUNITY): Payer: Self-pay

## 2022-07-04 ENCOUNTER — Inpatient Hospital Stay: Payer: Medicare HMO | Attending: Nurse Practitioner

## 2022-07-04 ENCOUNTER — Other Ambulatory Visit: Payer: Self-pay

## 2022-07-04 DIAGNOSIS — C7951 Secondary malignant neoplasm of bone: Secondary | ICD-10-CM | POA: Insufficient documentation

## 2022-07-04 DIAGNOSIS — Z7901 Long term (current) use of anticoagulants: Secondary | ICD-10-CM | POA: Insufficient documentation

## 2022-07-04 DIAGNOSIS — J984 Other disorders of lung: Secondary | ICD-10-CM | POA: Diagnosis not present

## 2022-07-04 DIAGNOSIS — Z923 Personal history of irradiation: Secondary | ICD-10-CM | POA: Diagnosis not present

## 2022-07-04 DIAGNOSIS — C7971 Secondary malignant neoplasm of right adrenal gland: Secondary | ICD-10-CM | POA: Diagnosis not present

## 2022-07-04 DIAGNOSIS — Z86718 Personal history of other venous thrombosis and embolism: Secondary | ICD-10-CM | POA: Insufficient documentation

## 2022-07-04 DIAGNOSIS — C3411 Malignant neoplasm of upper lobe, right bronchus or lung: Secondary | ICD-10-CM | POA: Insufficient documentation

## 2022-07-04 DIAGNOSIS — C3490 Malignant neoplasm of unspecified part of unspecified bronchus or lung: Secondary | ICD-10-CM | POA: Diagnosis not present

## 2022-07-04 DIAGNOSIS — R630 Anorexia: Secondary | ICD-10-CM

## 2022-07-04 DIAGNOSIS — R634 Abnormal weight loss: Secondary | ICD-10-CM

## 2022-07-04 DIAGNOSIS — K7689 Other specified diseases of liver: Secondary | ICD-10-CM | POA: Diagnosis not present

## 2022-07-04 LAB — CBC WITH DIFFERENTIAL (CANCER CENTER ONLY)
Abs Immature Granulocytes: 0.22 10*3/uL — ABNORMAL HIGH (ref 0.00–0.07)
Basophils Absolute: 0.1 10*3/uL (ref 0.0–0.1)
Basophils Relative: 1 %
Eosinophils Absolute: 0.1 10*3/uL (ref 0.0–0.5)
Eosinophils Relative: 1 %
HCT: 34.3 % — ABNORMAL LOW (ref 36.0–46.0)
Hemoglobin: 11.9 g/dL — ABNORMAL LOW (ref 12.0–15.0)
Immature Granulocytes: 2 %
Lymphocytes Relative: 16 %
Lymphs Abs: 1.8 10*3/uL (ref 0.7–4.0)
MCH: 30.7 pg (ref 26.0–34.0)
MCHC: 34.7 g/dL (ref 30.0–36.0)
MCV: 88.4 fL (ref 80.0–100.0)
Monocytes Absolute: 1.4 10*3/uL — ABNORMAL HIGH (ref 0.1–1.0)
Monocytes Relative: 13 %
Neutro Abs: 7.4 10*3/uL (ref 1.7–7.7)
Neutrophils Relative %: 67 %
Platelet Count: 223 10*3/uL (ref 150–400)
RBC: 3.88 MIL/uL (ref 3.87–5.11)
RDW: 14.4 % (ref 11.5–15.5)
WBC Count: 10.9 10*3/uL — ABNORMAL HIGH (ref 4.0–10.5)
nRBC: 0 % (ref 0.0–0.2)

## 2022-07-04 LAB — CMP (CANCER CENTER ONLY)
ALT: 15 U/L (ref 0–44)
AST: 30 U/L (ref 15–41)
Albumin: 4.2 g/dL (ref 3.5–5.0)
Alkaline Phosphatase: 65 U/L (ref 38–126)
Anion gap: 6 (ref 5–15)
BUN: 14 mg/dL (ref 8–23)
CO2: 32 mmol/L (ref 22–32)
Calcium: 9.5 mg/dL (ref 8.9–10.3)
Chloride: 95 mmol/L — ABNORMAL LOW (ref 98–111)
Creatinine: 1.22 mg/dL — ABNORMAL HIGH (ref 0.44–1.00)
GFR, Estimated: 46 mL/min — ABNORMAL LOW (ref >60)
Glucose, Bld: 98 mg/dL (ref 70–99)
Potassium: 5.4 mmol/L — ABNORMAL HIGH (ref 3.5–5.1)
Sodium: 133 mmol/L — ABNORMAL LOW (ref 135–145)
Total Bilirubin: 0.6 mg/dL (ref 0.3–1.2)
Total Protein: 7.1 g/dL (ref 6.5–8.1)

## 2022-07-04 MED ORDER — IOHEXOL 300 MG/ML  SOLN
75.0000 mL | Freq: Once | INTRAMUSCULAR | Status: AC | PRN
  Administered 2022-07-04: 75 mL via INTRAVENOUS

## 2022-07-04 MED ORDER — DRONABINOL 10 MG PO CAPS: 10.0000 mg | ORAL_CAPSULE | Freq: Every day | ORAL | 0 refills | Status: DC

## 2022-07-04 MED ORDER — IOHEXOL 9 MG/ML PO SOLN
1000.0000 mL | ORAL | Status: AC
  Administered 2022-07-04: 1000 mL via ORAL

## 2022-07-04 NOTE — Telephone Encounter (Signed)
Pt husband craig called for a refill of marinol, see new orders.

## 2022-07-05 ENCOUNTER — Inpatient Hospital Stay: Payer: Medicare HMO | Admitting: Nurse Practitioner

## 2022-07-05 NOTE — Progress Notes (Unsigned)
[facility] OFFICE PROGRESS NOTE  [name], NP [address] [place] [address]  DIAGNOSIS:  Stage IV (T3, N2, M1 C) non-small cell lung cancer with unknown histologic subtype.  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   DETECTED ALTERATION(S) / BIOMARKER(S)      % CFDNA OR AMPLIFICATION        ASSOCIATED FDA-APPROVED THERAPIES         CLINICAL TRIAL AVAILABILITY [id number] 8.9%   Sotorasib Yes [id number] 7.2% None     Yes  PRIOR THERAPY: 1) Palliative radiotherapy to the painful bone lesions under the care of Dr. Sondra Come. 2) palliative radiation to the enlarging pulmonary mass under the care of Dr. Sondra Come. Last dose on 06/17/21  3) Palliative systemic chemotherapy with carboplatin for AUC of 5, Alimta 500 Mg/M2 and Keytruda 200 Mg IV every 3 weeks.  First dose of treatment on December 29, 2020. Status post 18  cycles.  Starting from cycle #5, the patient is going to start maintenance Alimta and Keytruda. Keytruda discontinued from cycle #7 due to myalgias.  Last cycle was given on December 20, 2021 discontinued secondary to disease progression.  CURRENT THERAPY: 1) Krazati (Adagrasib) 600 Mg p.o. twice daily.  First dose started January 17, 2022.  Status post  6 months of treatment. 2) Zometa every 6 weeks starting on 02/10/21, last dose 12/20/21***  INTERVAL HISTORY: Kristi Jennings 77 y.o. female returns clinic today for follow-up visit accompanied by her husband.  The patient was last seen by Dr. Julien Nordmann 1 month ago.  The patient is feeling fairly well today without any concerning complaints.  She is also followed closely by palliative care.  She is expressing concern with memory concerns at her last appointment and she had a brain MRI performed which showed incidental 5 mm aneurysm, no evidence of  metastatic disease, and ***.  Offered her referral to a neurologist which she declined at this time.  Regarding her treatment she is currently undergoing targeted treatment with Alfred Levins and she has been taking this for 6 months and tolerating it fairly well without any concerning adverse side effects.  Today she denies any fever, chills, or night sweats.  She had struggled with poor appetite and weight loss when she was initially diagnosed for which she is currently taking Marinol.  Remeron?  Her pain is also manage with palliative care.  She denies any chest pain, shortness of breath, cough, or hemoptysis?  Bone pain??  Falls or injuries??  She denies any nausea, vomiting, diarrhea, or constipation.  Denies any rashes or skin changes.  Denies any headache or visual changes.  She recently had a restaging CT scan performed.  She is here today for evaluation to review her scan results.  MEDICAL HISTORY: Past Medical History:  Diagnosis Date   Borderline diabetes mellitus    Bronchogenic cancer of right lung ([facility])    Dyslipidemia    History of radiation therapy    right chest 12/21/2020-12/24/2020  Dr Gery Pray   History of radiation therapy    right lung 06/07/2021-06/17/2021  Dr Gery Pray   Hypertension    Polymyalgia rheumatica ([facility])     ALLERGIES:  is allergic to shellfish-derived products and augmentin [amoxicillin-pot clavulanate].  MEDICATIONS:  Current Outpatient Medications  Medication Sig Dispense Refill  acetaminophen (TYLENOL) 650 MG CR tablet Take 1,300 mg by mouth 3 (three) times daily as needed for pain.     adagrasib (KRAZATI) 200 MG tablet Take 3 tablets (600 mg total) by mouth 2 (two) times daily. 180 tablet 0   apixaban (ELIQUIS) 2.5 MG TABS tablet Take 1 tablet (2.5 mg total) by mouth 2 (two) times daily. 60 tablet 1   atenolol (TENORMIN) 25 MG tablet Take 12.5 mg by mouth at bedtime.     Calcium-Vitamin D-Vitamin K (CALCIUM + D) [phone] MG-UNT-MCG CHEW       Cholecalciferol (VITAMIN D) 50 MCG (2000 UT) tablet      diclofenac Sodium (VOLTAREN) 1 % GEL Apply 4 g topically 4 (four) times daily. (Patient taking differently: Apply 4 g topically 4 (four) times daily as needed (pain).) 150 g 0   dronabinol (MARINOL) 10 MG capsule Take 1 capsule (10 mg total) by mouth daily. 30 capsule 0   escitalopram (LEXAPRO) 20 MG tablet TAKE ONE TABLET BY MOUTH AT BEDTIME 90 tablet 0   fexofenadine (ALLEGRA) 180 MG tablet Take 180 mg by mouth daily.     fluocinonide (LIDEX) 0.05 % external solution Apply topically daily.     folic acid (FOLVITE) 1 MG tablet Take 1 tablet (1 mg total) by mouth daily. 30 tablet 2   HYDROmorphone (DILAUDID) 4 MG tablet Take 4 mg by mouth every 6 (six) hours as needed for severe pain. Quantity of 120 tablets     mirtazapine (REMERON) 15 MG tablet Take 15 mg by mouth at bedtime.     morphine (MS CONTIN) 30 MG 12 hr tablet Take 1 tablet (30 mg total) by mouth every 12 (twelve) hours. 60 tablet 0   Multiple Vitamin (MULTI VITAMIN) TABS      ondansetron (ZOFRAN-ODT) 4 MG disintegrating tablet Take 1 tablet (4 mg total) by mouth 2 (two) times daily as needed for nausea or vomiting. 20 tablet 1   OVER THE COUNTER MEDICATION Take 2 Cans by mouth 2 (two) times daily as needed (for nutrition). Premier protein     predniSONE (DELTASONE) 1 MG tablet Take 3 mg by mouth daily.     predniSONE (DELTASONE) 5 MG tablet Take 5 mg by mouth daily.     Salicylic Acid (SCALPICIN EX) Apply 1 application. topically daily as needed.     senna-docusate (SENOKOT-S) 8.6-50 MG tablet Take 1 tablet by mouth 2 (two) times daily. 30 tablet 2   triamcinolone ointment (KENALOG) 0.1 % Apply 1 application. topically 2 (two) times daily.     No current facility-administered medications for this visit.    SURGICAL HISTORY: No past surgical history on file.  REVIEW OF SYSTEMS:   Review of Systems  Constitutional: Negative for appetite change, chills, fatigue, fever and  unexpected weight change.  HENT:   Negative for mouth sores, nosebleeds, sore throat and trouble swallowing.   Eyes: Negative for eye problems and icterus.  Respiratory: Negative for cough, hemoptysis, shortness of breath and wheezing.   Cardiovascular: Negative for chest pain and leg swelling.  Gastrointestinal: Negative for abdominal pain, constipation, diarrhea, nausea and vomiting.  Genitourinary: Negative for bladder incontinence, difficulty urinating, dysuria, frequency and hematuria.   Musculoskeletal: Negative for back pain, gait problem, neck pain and neck stiffness.  Skin: Negative for itching and rash.  Neurological: Negative for dizziness, extremity weakness, gait problem, headaches, light-headedness and seizures.  Hematological: Negative for adenopathy. Does not bruise/bleed easily.  Psychiatric/Behavioral: Negative for confusion, depression and sleep disturbance.  The patient is not nervous/anxious.     PHYSICAL EXAMINATION:  There were no vitals taken for this visit.  ECOG PERFORMANCE STATUS: {CHL ONC ECOG [id number]  Physical Exam  Constitutional: Oriented to person, place, and time and well-developed, well-nourished, and in no distress. No distress.  HENT:  Head: Normocephalic and atraumatic.  Mouth/Throat: Oropharynx is clear and moist. No oropharyngeal exudate.  Eyes: Conjunctivae are normal. Right eye exhibits no discharge. Left eye exhibits no discharge. No scleral icterus.  Neck: Normal range of motion. Neck supple.  Cardiovascular: Normal rate, regular rhythm, normal heart sounds and intact distal pulses.   Pulmonary/Chest: Effort normal and breath sounds normal. No respiratory distress. No wheezes. No rales.  Abdominal: Soft. Bowel sounds are normal. Exhibits no distension and no mass. There is no tenderness.  Musculoskeletal: Normal range of motion. Exhibits no edema.  Lymphadenopathy:    No cervical adenopathy.  Neurological: Alert and oriented to person,  place, and time. Exhibits normal muscle tone. Gait normal. Coordination normal.  Skin: Skin is warm and dry. No rash noted. Not diaphoretic. No erythema. No pallor.  Psychiatric: Mood, memory and judgment normal.  Vitals reviewed.  LABORATORY DATA: Lab Results  Component Value Date   WBC 10.9 (H) 07/04/2022   HGB 11.9 (L) 07/04/2022   HCT 34.3 (L) 07/04/2022   MCV 88.4 07/04/2022   PLT 223 07/04/2022      Chemistry      Component Value Date/Time   NA 133 (L) 07/04/2022 1043   K 5.4 (H) 07/04/2022 1043   CL 95 (L) 07/04/2022 1043   CO2 32 07/04/2022 1043   BUN 14 07/04/2022 1043   CREATININE 1.22 (H) 07/04/2022 1043      Component Value Date/Time   CALCIUM 9.5 07/04/2022 1043   ALKPHOS 65 07/04/2022 1043   AST 30 07/04/2022 1043   ALT 15 07/04/2022 1043   BILITOT 0.6 07/04/2022 1043       RADIOGRAPHIC STUDIES:  CT Chest W Contrast  Result Date: 07/04/2022 CLINICAL DATA:  Non-small cell lung cancer staging in a 77 year old female. Previously shown to have metastatic disease to the RIGHT adrenal. * Tracking Code: BO * EXAM: CT CHEST, ABDOMEN, AND PELVIS WITH CONTRAST TECHNIQUE: Multidetector CT imaging of the chest, abdomen and pelvis was performed following the standard protocol during bolus administration of intravenous contrast. RADIATION DOSE REDUCTION: This exam was performed according to the departmental dose-optimization program which includes automated exposure control, adjustment of the mA and/or kV according to patient size and/or use of iterative reconstruction technique. CONTRAST:  48mL OMNIPAQUE IOHEXOL 300 MG/ML  SOLN COMPARISON:  Imaging from March 14, 2022 FINDINGS: CT CHEST FINDINGS Cardiovascular: Aortic atherosclerosis and mild aneurysmal caliber of the ascending thoracic aorta 4.2 cm is unchanged. Normal heart size. Scattered coronary artery calcifications. Central pulmonary vasculature is normal caliber. Mediastinum/Nodes: No thoracic inlet, axillary,  mediastinal or hilar adenopathy. Esophagus grossly normal. Lungs/Pleura: Masslike consolidation in the RIGHT upper lobe measuring 3.2 x 3.3 cm previous the 3.7 x 3.0 cm. No signs of pleural effusion or gross pleural nodularity. Cavitary area along the inferior margin of the consolidative change it extends to the RIGHT hilum from apex measuring 2.0 x 1.9 cm, previously 2.1 x 1.9 cm when measured in a similar fashion including medial soft tissue. No new signs of suspicious pulmonary nodule or mass. Musculoskeletal: See below for full musculoskeletal details. No chest wall mass. CT ABDOMEN PELVIS FINDINGS Hepatobiliary: Invasion of the posterior RIGHT hepatic margin from an adrenal metastasis  measuring 15 mm as compared to 25 mm greatest axial dimension. Subtle area of hypodensity in the posterior RIGHT hepatic lobe 12 mm, stable compared to previous imaging. No new hepatic lesion. No pericholecystic stranding or sign of biliary duct dilation. Pancreas: Normal, without mass, inflammation or ductal dilatation. Spleen: Normal. Adrenals/Urinary Tract: Adrenal gland mass on the RIGHT 26 mm as compared to 40 mm greatest axial dimension (image 49/2) LEFT adrenal gland grossly normal, not well evaluated due to cachexia. No suspicious renal lesion or sign of hydronephrosis. Urinary bladder is collapsed without adjacent stranding. Stomach/Bowel: No acute gastrointestinal findings. Moderate stool in the colon. Vascular/Lymphatic: Aortic atherosclerosis. No sign of aneurysm. Smooth contour of the IVC. There is no gastrohepatic or hepatoduodenal ligament lymphadenopathy. No retroperitoneal or mesenteric lymphadenopathy. No pelvic sidewall lymphadenopathy. Atherosclerotic changes are moderate marked. Reproductive: Not well evaluated, grossly unremarkable. Other: No ascites.  No pneumoperitoneum. Musculoskeletal: Soft tissue along the LEFT lateral aspect of T4. This rib reliable shows increasing sclerosis since previous imaging.  Soft tissue measuring 8 mm on image 10/2 is a new finding. Unchanged compression fracture at T4. Unchanged compression fracture at T8 with central lucency suggesting association with metastatic process. Loss of height at this level slightly greater than 50%. T12 with vertebral destruction along the posterior aspect of the fracture also compatible with pathologic fracture and greater than 50% loss of height. Angulation which is slight and kyphotic at the level of T12 is unchanged. Pathologic fracture of the sternum also unchanged with lucency in the upper sternal body. No new signs of disease elsewhere. Anterolateral RIGHT third rib destruction similar to previous imaging as well. IMPRESSION: 1. Decreased size of pulmonary mass an area of cavitation in the RIGHT upper chest. 2. Decreased size of hepatic and RIGHT adrenal metastasis. 3. New soft tissue adjacent to T3 above the pathologic fracture at T4 and along the upper margin of T4 suspicious for disease extension along the vertebral bodies at this level with otherwise similar appearance of pathologic fractures associated with bony destruction at T8 and T12 as well as the sternal manubrium. 4. No new signs of disease elsewhere. 5. Aortic atherosclerosis and mild aneurysmal caliber of the ascending thoracic aorta 4.2 cm is unchanged. Attention on subsequent imaging. 6. Coronary artery calcifications. Aortic Atherosclerosis (ICD10-I70.0). Electronically Signed   By: Zetta Bills M.D.   On: 07/04/2022 17:08   CT Abdomen Pelvis W Contrast  Result Date: 07/04/2022 CLINICAL DATA:  Non-small cell lung cancer staging in a 77 year old female. Previously shown to have metastatic disease to the RIGHT adrenal. * Tracking Code: BO * EXAM: CT CHEST, ABDOMEN, AND PELVIS WITH CONTRAST TECHNIQUE: Multidetector CT imaging of the chest, abdomen and pelvis was performed following the standard protocol during bolus administration of intravenous contrast. RADIATION DOSE REDUCTION:  This exam was performed according to the departmental dose-optimization program which includes automated exposure control, adjustment of the mA and/or kV according to patient size and/or use of iterative reconstruction technique. CONTRAST:  79mL OMNIPAQUE IOHEXOL 300 MG/ML  SOLN COMPARISON:  Imaging from March 14, 2022 FINDINGS: CT CHEST FINDINGS Cardiovascular: Aortic atherosclerosis and mild aneurysmal caliber of the ascending thoracic aorta 4.2 cm is unchanged. Normal heart size. Scattered coronary artery calcifications. Central pulmonary vasculature is normal caliber. Mediastinum/Nodes: No thoracic inlet, axillary, mediastinal or hilar adenopathy. Esophagus grossly normal. Lungs/Pleura: Masslike consolidation in the RIGHT upper lobe measuring 3.2 x 3.3 cm previous the 3.7 x 3.0 cm. No signs of pleural effusion or gross pleural nodularity. Cavitary area along  the inferior margin of the consolidative change it extends to the RIGHT hilum from apex measuring 2.0 x 1.9 cm, previously 2.1 x 1.9 cm when measured in a similar fashion including medial soft tissue. No new signs of suspicious pulmonary nodule or mass. Musculoskeletal: See below for full musculoskeletal details. No chest wall mass. CT ABDOMEN PELVIS FINDINGS Hepatobiliary: Invasion of the posterior RIGHT hepatic margin from an adrenal metastasis measuring 15 mm as compared to 25 mm greatest axial dimension. Subtle area of hypodensity in the posterior RIGHT hepatic lobe 12 mm, stable compared to previous imaging. No new hepatic lesion. No pericholecystic stranding or sign of biliary duct dilation. Pancreas: Normal, without mass, inflammation or ductal dilatation. Spleen: Normal. Adrenals/Urinary Tract: Adrenal gland mass on the RIGHT 26 mm as compared to 40 mm greatest axial dimension (image 49/2) LEFT adrenal gland grossly normal, not well evaluated due to cachexia. No suspicious renal lesion or sign of hydronephrosis. Urinary bladder is collapsed without  adjacent stranding. Stomach/Bowel: No acute gastrointestinal findings. Moderate stool in the colon. Vascular/Lymphatic: Aortic atherosclerosis. No sign of aneurysm. Smooth contour of the IVC. There is no gastrohepatic or hepatoduodenal ligament lymphadenopathy. No retroperitoneal or mesenteric lymphadenopathy. No pelvic sidewall lymphadenopathy. Atherosclerotic changes are moderate marked. Reproductive: Not well evaluated, grossly unremarkable. Other: No ascites.  No pneumoperitoneum. Musculoskeletal: Soft tissue along the LEFT lateral aspect of T4. This rib reliable shows increasing sclerosis since previous imaging. Soft tissue measuring 8 mm on image 10/2 is a new finding. Unchanged compression fracture at T4. Unchanged compression fracture at T8 with central lucency suggesting association with metastatic process. Loss of height at this level slightly greater than 50%. T12 with vertebral destruction along the posterior aspect of the fracture also compatible with pathologic fracture and greater than 50% loss of height. Angulation which is slight and kyphotic at the level of T12 is unchanged. Pathologic fracture of the sternum also unchanged with lucency in the upper sternal body. No new signs of disease elsewhere. Anterolateral RIGHT third rib destruction similar to previous imaging as well. IMPRESSION: 1. Decreased size of pulmonary mass an area of cavitation in the RIGHT upper chest. 2. Decreased size of hepatic and RIGHT adrenal metastasis. 3. New soft tissue adjacent to T3 above the pathologic fracture at T4 and along the upper margin of T4 suspicious for disease extension along the vertebral bodies at this level with otherwise similar appearance of pathologic fractures associated with bony destruction at T8 and T12 as well as the sternal manubrium. 4. No new signs of disease elsewhere. 5. Aortic atherosclerosis and mild aneurysmal caliber of the ascending thoracic aorta 4.2 cm is unchanged. Attention on  subsequent imaging. 6. Coronary artery calcifications. Aortic Atherosclerosis (ICD10-I70.0). Electronically Signed   By: Zetta Bills M.D.   On: 07/04/2022 17:08   MR Brain W Wo Contrast  Result Date: 06/30/2022 CLINICAL DATA:  Memory change R41.3 (ICD-10-CM). Metastatic disease evaluation; Lung cancer, memory changes, Restage. Non-small cell carcinoma of lung, stage 4, right (HCC) C34.91 (ICD-10-CM).02.356 EXAM: MRI HEAD WITHOUT AND WITH CONTRAST TECHNIQUE: Multiplanar, multiecho pulse sequences of the brain and surrounding structures were obtained without and with intravenous contrast. CONTRAST:  69mL GADAVIST GADOBUTROL 1 MMOL/ML IV SOLN COMPARISON:  MRI of the brain November 15, 2020. FINDINGS: Brain: No acute infarction, hemorrhage, hydrocephalus, extra-axial collection or mass lesion. Scattered and confluent foci of T2 hyperintensity are seen within the white matter of the cerebral hemispheres, nonspecific, most likely related to chronic small vessel ischemia, mildly progressed when compared to  prior MRI. Moderate to advanced parenchymal volume loss. No focus of abnormal contrast enhancement identified. Vascular: Major intracranial flow voids are preserved. A 5 x 4 mm left posterior communicating artery aneurysm is identified. Skull and upper cervical spine: Normal marrow signal. Sinuses/Orbits: Mild mucosal thickening of the right sphenoid sinus with bubbly secretion. Trace mucosal thickening of the ethmoid cells and maxillary sinuses. The orbits are maintained. Other: None. IMPRESSION: 1. No evidence of intracranial metastatic disease. 2. Moderate chronic microvascular ischemic changes of the white matter, mildly progressed when compared to prior MRI. 3. Moderate to advanced parenchymal volume loss. 4. A 5 mm left posterior communicating artery aneurysm. Correlation with MR angiogram suggested. Electronically Signed   By: Pedro Earls M.D.   On: 06/30/2022 09:33     ASSESSMENT/PLAN:   This is a very pleasant 77 year old Caucasian female diagnosed with stage IV (T3, N2, M1 C) non-small cell lung cancer with unknown histologic subtype questionably likely to be squamous cell carcinoma based on the morphology of the right upper lobe cavitary mass with mediastinal lymphadenopathy metastatic disease to the bones as well as the right adrenal gland.  She was diagnosed in June 2022.  Adenocarcinoma cannot be completely ruled out especially with the K-ras G12C mutation.  Since the patient has an actionable mutation with K-ras G12C, this can be used as an option in the second line setting.    The patient started induction systemic chemotherapy with carboplatin for AUC of 5, Alimta 500 Mg/M2 and Keytruda 200 Mg IV every 3 weeks and currently on maintenance treatment with single agent Alimta after Keytruda was discontinued starting from cycle #7 because of significant arthralgia and myalgia. She is status post 18 cycles of total treatment. The patient has been tolerating this treatment well except for fatigue.   She underwent palliative radiotherapy to enlarging pulmonary nodule under the care of Dr. Sondra Come.   Unfortunately her repeat imaging studies with CT scan showed evidence for disease progression with increase in the size of the apical right lung mass in addition to new and enlarging pulmonary metastasis as well as significant increase in the large right adrenal metastasis and new metastatic adenopathy to the retroperitoneum. The patient started treatment with Krazati (Adagrasib) 600 mg p.o. twice daily on January 17, 2022.  Status post 6 months of treatment.   Patient recently had a restaging CT scan performed.  Dr. Julien Nordmann present independently reviewed the scan and discussed the results with the patient today.  The scan showed ***  Mohamed recommends that she continue on the same dose.  Regarding the ***Mohamed recommends referral to radiation oncology?  Will see her back for follow-up  visit in 4 weeks for evaluation repeat blood work.  She will continue to be on dose reduced Eliquis due to her history of DVT.  The dose needs to be reduced by 50% due to drug interaction with Alfred Levins that can increase serum concentrations of Eliquis.  She will continue to follow with palliative care.  The patient I discussed her memory loss and the incidental 5 mm aneurysm seen on her repeat brain MRI.  Offered referral to neurology.  The patient declined at this time.  The patient and I visited signs and symptoms of aneurysm and symptoms that would warrant emergency room evaluation.  The patient was advised to call immediately if she has any concerning symptoms in the interval. The patient voices understanding of current disease status and treatment options and is in agreement with the current care  plan. All questions were answered. The patient knows to call the clinic with any problems, questions or concerns. We can certainly see the patient much sooner if necessary   No orders of the defined types were placed in this encounter.    I spent {CHL ONC TIME VISIT - JGGEZ:[id number]} counseling the patient face to face. The total time spent in the appointment was {CHL ONC TIME VISIT - TKPTW:[id number]}.  Kerry-Anne Mezo L Kein Carlberg, PA-C 07/05/22

## 2022-07-06 ENCOUNTER — Telehealth: Payer: Self-pay | Admitting: Physician Assistant

## 2022-07-06 ENCOUNTER — Inpatient Hospital Stay (HOSPITAL_BASED_OUTPATIENT_CLINIC_OR_DEPARTMENT_OTHER): Payer: Medicare HMO | Admitting: Physician Assistant

## 2022-07-06 ENCOUNTER — Inpatient Hospital Stay: Payer: Medicare HMO | Admitting: Nurse Practitioner

## 2022-07-06 DIAGNOSIS — C7971 Secondary malignant neoplasm of right adrenal gland: Secondary | ICD-10-CM

## 2022-07-06 DIAGNOSIS — C3491 Malignant neoplasm of unspecified part of right bronchus or lung: Secondary | ICD-10-CM

## 2022-07-06 DIAGNOSIS — C7951 Secondary malignant neoplasm of bone: Secondary | ICD-10-CM | POA: Diagnosis not present

## 2022-07-06 DIAGNOSIS — I10 Essential (primary) hypertension: Secondary | ICD-10-CM | POA: Diagnosis not present

## 2022-07-06 DIAGNOSIS — Z86718 Personal history of other venous thrombosis and embolism: Secondary | ICD-10-CM

## 2022-07-06 DIAGNOSIS — Z7901 Long term (current) use of anticoagulants: Secondary | ICD-10-CM | POA: Diagnosis not present

## 2022-07-06 DIAGNOSIS — C3411 Malignant neoplasm of upper lobe, right bronchus or lung: Secondary | ICD-10-CM | POA: Diagnosis not present

## 2022-07-06 MED ORDER — PROCHLORPERAZINE MALEATE 10 MG PO TABS: 10.0000 mg | ORAL_TABLET | Freq: Four times a day (QID) | ORAL | 2 refills | Status: DC | PRN

## 2022-07-06 NOTE — Progress Notes (Deleted)
[place]  [phone] [phone]) [phone]   Name: Kristi Jennings Date: 07/06/2022 MRN: [id number]  DOB: February 02, 1946  Patient Care Team: [name], NP as PCP - General (Family Medicine) [place] as Registered Nurse [facility] and Palliative Medicine) Pickenpack-Cousar, Carlena Sax, NP as Nurse Practitioner (Nurse Practitioner)     INTERVAL HISTORY: Kristi Jennings is a 77 y.o. 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 Palliative ask to see for symptom management.  SOCIAL HISTORY:     reports that she quit smoking about 13 years ago. Her smoking use included cigarettes. She has a 43.00 pack-year smoking history. She has never used smokeless tobacco. She reports that she does not currently use alcohol. She reports that she does not use drugs.  ADVANCE DIRECTIVES:  None on file   CODE STATUS:   PAST MEDICAL HISTORY: Past Medical History:  Diagnosis Date   Borderline diabetes mellitus    Bronchogenic cancer of right lung ([place])    Dyslipidemia    History of radiation therapy    right chest 12/21/2020-12/24/2020  Dr Gery Pray   History of radiation therapy    right lung 06/07/2021-06/17/2021  Dr Gery Pray   Hypertension    Polymyalgia rheumatica ([place])    ALLERGIES:  is allergic to shellfish-derived products and augmentin [amoxicillin-pot clavulanate].  MEDICATIONS:  Current Outpatient Medications  Medication Sig Dispense Refill   acetaminophen (TYLENOL) 650 MG CR tablet Take 1,300 mg by mouth 3 (three) times daily as needed for pain.     adagrasib (KRAZATI) 200 MG tablet Take 3 tablets (600 mg total) by mouth 2 (two) times daily. 180 tablet 0   apixaban (ELIQUIS) 2.5 MG TABS tablet Take 1 tablet (2.5 mg total) by mouth 2 (two) times daily. 60 tablet 1   atenolol (TENORMIN) 25 MG tablet Take 12.5 mg by  mouth at bedtime.     Calcium-Vitamin D-Vitamin K (CALCIUM + D) [phone] MG-UNT-MCG CHEW      Cholecalciferol (VITAMIN D) 50 MCG (2000 UT) tablet      diclofenac Sodium (VOLTAREN) 1 % GEL Apply 4 g topically 4 (four) times daily. (Patient taking differently: Apply 4 g topically 4 (four) times daily as needed (pain).) 150 g 0   dronabinol (MARINOL) 10 MG capsule Take 1 capsule (10 mg total) by mouth daily. 30 capsule 0   escitalopram (LEXAPRO) 20 MG tablet TAKE ONE TABLET BY MOUTH AT BEDTIME 90 tablet 0   fexofenadine (ALLEGRA) 180 MG tablet Take 180 mg by mouth daily.     fluocinonide (LIDEX) 0.05 % external solution Apply topically daily.     folic acid (FOLVITE) 1 MG tablet Take 1 tablet (1 mg total) by mouth daily. 30 tablet 2   HYDROmorphone (DILAUDID) 4 MG tablet Take 4 mg by mouth every 6 (six) hours as needed for severe pain. Quantity of 120 tablets     mirtazapine (REMERON) 15 MG tablet Take 15 mg by mouth at bedtime.     morphine (MS CONTIN) 30 MG 12 hr tablet Take 1 tablet (30 mg total) by mouth every 12 (twelve) hours. 60 tablet 0   Multiple Vitamin (MULTI VITAMIN) TABS      ondansetron (ZOFRAN-ODT) 4 MG disintegrating tablet Take 1 tablet (4 mg total) by mouth 2 (two) times daily as needed for nausea or vomiting. 20 tablet 1   OVER THE  COUNTER MEDICATION Take 2 Cans by mouth 2 (two) times daily as needed (for nutrition). Premier protein     predniSONE (DELTASONE) 1 MG tablet Take 3 mg by mouth daily.     predniSONE (DELTASONE) 5 MG tablet Take 5 mg by mouth daily.     Salicylic Acid (SCALPICIN EX) Apply 1 application. topically daily as needed.     senna-docusate (SENOKOT-S) 8.6-50 MG tablet Take 1 tablet by mouth 2 (two) times daily. 30 tablet 2   triamcinolone ointment (KENALOG) 0.1 % Apply 1 application. topically 2 (two) times daily.     No current facility-administered medications for this visit.    VITAL SIGNS: There were no vitals taken for this visit. There were no  vitals filed for this visit.   Estimated body mass index is 14.45 kg/m as calculated from the following:   Height as of 03/16/22: 5\' 6"  (1.676 m).   Weight as of 06/19/22: 89 lb 8 oz (40.6 kg).  PERFORMANCE STATUS (ECOG) : 1 - Symptomatic but completely ambulatory  Assessment NAD, ambulatory RRR Normal breathing pattern AAO x3, emotional   IMPRESSION:    PLAN: Decrease MS Contin to 30mg  at bedtime only. We will continue to closely evaluate and began to wean down and eventually discontinue if possible.  Decrease Dronabinol 10 mg to once daily for appetite.  Continue mirtazapine 15 mg as prescribed We will continue to closely monitor.  I will plan to see patient back in 4-6 weeks in collaboration with her other appointments. We will plan for phone follow-up in 1 week.    Patient expressed understanding and was in agreement with this plan. She also understands that She can call the clinic at any time with any questions, concerns, or complaints.     Time Total: 65 min   Visit consisted of counseling and education dealing with the complex and emotionally intense issues of symptom management and palliative care in the setting of serious and potentially life-threatening illness.Greater than 50%  of this time was spent counseling and coordinating care related to the above assessment and plan.  Alda Lea, AGPCNP-BC  Palliative Medicine Team/[place]

## 2022-07-06 NOTE — Telephone Encounter (Signed)
Patient has an appointment scheduled with me, Julien Nordmann, and palliative care today.  She reports she has an upset stomach and will not be making it to the clinic today.  I did offer to make this a telephone visit but her husband is not available and she wants to make sure that he is around for the visit.  She would prefer we reschedule.  I will send a message to the schedulers.

## 2022-07-07 ENCOUNTER — Telehealth: Payer: Self-pay | Admitting: Medical Oncology

## 2022-07-07 NOTE — Telephone Encounter (Signed)
Diarrhea-1 episode of loose , mushy stool again today 15 mins after eating. "It is  tearing my stomach up". Denies blood , cramping , no bloating.  Per Cassie, I instructed husband to give imodium ad 1 TABLET in am.

## 2022-07-09 DIAGNOSIS — C3491 Malignant neoplasm of unspecified part of right bronchus or lung: Secondary | ICD-10-CM | POA: Diagnosis not present

## 2022-07-10 ENCOUNTER — Other Ambulatory Visit: Payer: Self-pay

## 2022-07-10 DIAGNOSIS — C3491 Malignant neoplasm of unspecified part of right bronchus or lung: Secondary | ICD-10-CM

## 2022-07-10 DIAGNOSIS — C7951 Secondary malignant neoplasm of bone: Secondary | ICD-10-CM

## 2022-07-10 MED ORDER — FOLIC ACID 1 MG PO TABS: 1.0000 mg | ORAL_TABLET | Freq: Every day | ORAL | 2 refills | Status: DC

## 2022-07-15 DIAGNOSIS — C3491 Malignant neoplasm of unspecified part of right bronchus or lung: Secondary | ICD-10-CM | POA: Diagnosis not present

## 2022-07-18 ENCOUNTER — Other Ambulatory Visit (HOSPITAL_COMMUNITY): Payer: Self-pay

## 2022-07-20 DIAGNOSIS — C3491 Malignant neoplasm of unspecified part of right bronchus or lung: Secondary | ICD-10-CM | POA: Diagnosis not present

## 2022-07-20 DIAGNOSIS — C7951 Secondary malignant neoplasm of bone: Secondary | ICD-10-CM | POA: Diagnosis not present

## 2022-07-20 DIAGNOSIS — C779 Secondary and unspecified malignant neoplasm of lymph node, unspecified: Secondary | ICD-10-CM | POA: Diagnosis not present

## 2022-07-20 DIAGNOSIS — C787 Secondary malignant neoplasm of liver and intrahepatic bile duct: Secondary | ICD-10-CM | POA: Diagnosis not present

## 2022-07-21 ENCOUNTER — Telehealth: Payer: Self-pay | Admitting: Medical Oncology

## 2022-07-21 NOTE — Telephone Encounter (Signed)
New L Shoulder blade  area of  pain - x 2 weeks "when he puts pressure on the area he says it  feels like a muscle , she jumps from the pain. Heat , massage does not help.   Requested- Mohamed or Cassie review last scan and see there is anything suspicious in the area.    Appts confirmed.

## 2022-07-23 ENCOUNTER — Other Ambulatory Visit: Payer: Self-pay | Admitting: Internal Medicine

## 2022-07-23 DIAGNOSIS — C3491 Malignant neoplasm of unspecified part of right bronchus or lung: Secondary | ICD-10-CM

## 2022-07-24 ENCOUNTER — Telehealth: Payer: Self-pay | Admitting: Internal Medicine

## 2022-07-24 ENCOUNTER — Other Ambulatory Visit (HOSPITAL_COMMUNITY): Payer: Self-pay

## 2022-07-24 ENCOUNTER — Other Ambulatory Visit: Payer: Self-pay

## 2022-07-24 ENCOUNTER — Telehealth: Payer: Self-pay | Admitting: Medical Oncology

## 2022-07-24 DIAGNOSIS — C3491 Malignant neoplasm of unspecified part of right bronchus or lung: Secondary | ICD-10-CM

## 2022-07-24 DIAGNOSIS — C7951 Secondary malignant neoplasm of bone: Secondary | ICD-10-CM

## 2022-07-24 MED ORDER — MORPHINE SULFATE ER 30 MG PO TBCR: 30.0000 mg | EXTENDED_RELEASE_TABLET | Freq: Two times a day (BID) | ORAL | 0 refills | Status: DC

## 2022-07-24 NOTE — Telephone Encounter (Signed)
Per Dr. Julien Nordmann I told pt and husband "There is a small spot close to left side of T4. Will refer to rad Onc ".

## 2022-07-24 NOTE — Telephone Encounter (Signed)
Called patient regarding March appointments, patient is notified.

## 2022-07-24 NOTE — Progress Notes (Signed)
Histology and Location of Primary Cancer:   Stage IV (T3, N2, M1 C) non-small cell lung cancer   Sites of Visceral and Bony Metastatic Disease:  CT C/A/P w/ Contrast 07/04/2022 --IMPRESSION:  Decreased size of pulmonary mass an area of cavitation in the RIGHT upper chest. Decreased size of hepatic and RIGHT adrenal metastasis. New soft tissue adjacent to T3 above the pathologic fracture at T4 and along the upper margin of T4 suspicious for disease extension along the vertebral bodies at this level with otherwise similar appearance of pathologic fractures associated with bony destruction at T8 and T12 as well as the sternal manubrium. No new signs of disease elsewhere. Aortic atherosclerosis and mild aneurysmal caliber of the ascending thoracic aorta 4.2 cm is unchanged. Attention on subsequent imaging. Coronary artery calcifications.  Location(s) of Symptomatic Metastases: Left shoulder  Past/Anticipated chemotherapy by medical oncology, if any:  Under care of Dr. Si Gaul 07/06/2022 (Cassie Heilingoetter, PA-C's note) --CURRENT THERAPY: Krazati (Adagrasib) 600 Mg p.o. twice daily.  First dose started January 17, 2022.  Status post  6 months of treatment. --PRIOR THERAPY: Palliative radiotherapy to the painful bone lesions under the care of Dr. Roselind Messier. Palliative radiation to the enlarging pulmonary mass under the care of Dr. Roselind Messier. Last dose on 06/17/21  Palliative systemic chemotherapy with carboplatin for AUC of 5, Alimta 500 Mg/M2 and Keytruda 200 Mg IV every 3 weeks.   First dose of treatment on December 29, 2020. Status post 18  cycles.   Starting from cycle #5, the patient is going to start maintenance Alimta and Keytruda.  Keytruda discontinued from cycle #7 due to myalgias.   Last cycle was given on December 20, 2021 discontinued secondary to disease progression. Zometa every 6 weeks starting on 02/10/21, last dose 12/20/21 --ASSESSMENT/PLAN: The patient recently had a restaging  CT scan performed.  Dr. [name] personally and independently reviewed the scan and discussed the results with the patient today. The scan showed continued positive response to treatment with a decrease in size of the pulmonary mass and decrease in size of the hepatic and right adrenal metastasis.   However the scan did mention new soft tissue adjacent to the T3 about the pathological fracture at T4 suspicious for disease extension.   Given the positive response to treatment in the liver, adrenal gland, and lung, Dr. [name] recommends that she continue on the same treatment at the same dose and monitor the area around T3 closely. Also reviewed the MRI results and once again offered referral to neurology for which the patient and her husband declined.   We discussed the small 5 mm aneurysm.   We discussed signs and symptoms of aneurysm rupture which would warrant emergency room evaluation. With the T3 lesion, the patient's husband did mention that she is been having some increase in pain near her scapula.   Dr. [name] feels that this may be lower than expected if related to the T3 soft tissue area and recommends monitoring it closely.   I encouraged the patient and her husband to please call us sooner if they notice that she is having increased pain and we would evaluate this area sooner. Will see her back for follow-up visit in 4 weeks for evaluation repeat blood work. She will continue to be on dose reduced Eliquis due to her history of DVT.   The dose needs to be reduced by 50% due to drug interaction with Caryn Section that can increase serum concentrations of Eliquis. She missed her appointment with  palliative care today.   We will help facilitate getting this rescheduled.   I have reached out to palliative care.   I also encouraged the patient and her husband to discuss her pain and her pain control and to make sure that palliative care is always in agreement with any changes in how she is taking her  pain medication  Pain on a scale of 0-10 is:  Reports 10 out of 10 to the left shoulder. States yesterday was the worst it's been and prevented her from sleeping. Managing with long-acting morphine and immediate release dilaudid for break through pain  If Spine Met(s), symptoms, if any, include: Numbness or weakness in extremities (please describe): Denies Current Decadron regimen, if applicable: Takes 5mg  prednisone daily  Ambulatory status? Walker? Wheelchair?: Able to ambulate without an assistive device   SAFETY ISSUES: Prior radiation? Yes  06/07/2021 through 06/17/2021 Site Technique Total Dose (Gy) Dose per Fx (Gy) Completed Fx Beam Energies  Lung, Right: Lung_R IMRT 24/24 3 8/8 6X  12/13/20--12/24/2020 Site/Dose: Chest_Rt - 30.00 of 30.00Gy, 10 of 10 fractions delivered; 3.00 Gy/Fx  Pacemaker/ICD? No Possible current pregnancy? No--postmenopausal Is the patient on methotrexate? No  Current Complaints / other details:  Was dealnig with diarrhea. Husband reports she was 1 tablet of Imodium to help, that unfortunately caused so she is now dealing with constipation

## 2022-07-24 NOTE — Telephone Encounter (Signed)
Pt husband called for a refill of pain medication per pt husband pt is taking one morphine every evening, [name], NP notified in the change of how pt is taking medication. See new orders.

## 2022-07-26 DIAGNOSIS — C3491 Malignant neoplasm of unspecified part of right bronchus or lung: Secondary | ICD-10-CM | POA: Diagnosis not present

## 2022-07-26 DIAGNOSIS — Z87891 Personal history of nicotine dependence: Secondary | ICD-10-CM | POA: Diagnosis not present

## 2022-07-26 DIAGNOSIS — C7951 Secondary malignant neoplasm of bone: Secondary | ICD-10-CM | POA: Diagnosis not present

## 2022-07-26 NOTE — Progress Notes (Signed)
Radiation Oncology         [phone] ________________________________  Outpatient Re-Consultation  Name: Kristi Jennings MRN: [id number]  Date: 07/27/2022  DOB: [birth year]  [id number], Purcell Nails, NP  Curt Bears, MD   REFERRING PHYSICIAN: Curt Bears, MD  DIAGNOSIS: The primary encounter diagnosis was Metastatic bone tumor [facility]). A diagnosis of Non-small cell carcinoma of lung, stage 4, right (HCC) was also pertinent to this visit.    Stage IV (T3, N2, M1 C) non-small cell lung cancer with unknown histologic subtype.  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  Recent evidence of disease extension about the T4 vertebra   Interval Since Last Radiation: 1 year, 1 month, and 9 days    Intent: Palliative  2) Radiation Treatment Dates: 06/07/2021 through 06/17/2021 Site Technique Total Dose (Gy) Dose per Fx (Gy) Completed Fx Beam Energies  Lung, Right: Lung_R IMRT 24/24 3 8/8 6X    1) Treatment Dates: 12/13/20- 12/24/2020 Site/Dose: Chest_Rt - 30.00 of 30.00Gy, 10 of 10 fractions delivered; 3.00 Gy/Fx  HISTORY OF PRESENT ILLNESS::Kristi Jennings is a 77 y.o. female who is accompanied by her husband. 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 I last met with patient on 07/21/21 for follow-up of radiation to the right lung. In the following months, she continued with maintenance treatment (Alimta) under Dr. Julien Nordmann. CT CAP performed on 09/05/21 showed stability of the RUL mass, right adrenal metastasis, lytic and sclerotic osseous metastases, and a decrease in size of the hepatic metastasis. CT CAP on 11/07/21 again showed stable disease overall other than a slight interval increase in  size of the right adrenal mass.   However, CT CAP on 01/07/22 unfortunately showed evidence of disease progression, demonstrated by: a mild increase in size of the right apical lung mass, 3 new sites of pulmonary metastases in the right lung, a substantial increase in size of large right adrenal metastasis with new invasion of the posterior right liver and upper right kidney, and new metastatic adenopathy to the retroperitoneum. CT otherwise showed stable hepatic metastasis and stable right third rib, T8, T12, and vertebral bone metastases.   Subsequently, maintenance alimta was discontinued and the patient was started on Krazati (Adagrasib) 600 mg p.o. twice daily on 01/17/22. For her bony disease, she was instructed to continue with zometa. She has tolerated Adagrasib well thus far other than fatigue and intermittent bouts of diarrhea. She also endorsed a decrease in appetite, however this was present prior to Adagrasib. Dr. Julien Nordmann started her on Marinol for this.   CT CAP on 03/14/22 showed an interval response to therapy, demonstrated by: a decrease in size of the RUL mass and RUL nodule, and interval improvement of the right adrenal and hepatic metastatic disease. CT also showed stable probable pathologic compression fractures at T4, T8 and T12, and unchanged chronic appearing rib destruction and sternal fracture.    CT CAP on 07/04/22 however demonstrated new soft tissue adjacent to T3 above the pathologic fracture at T4 and along the upper margin of T4 suspicious for disease extension along the vertebral bodies at that level. The pathologic fractures associated with bony destruction at T8, T12, and sternal manubrium appeared otherwise stable. Asides from this  finding, CT otherwise showed a decrease in size of the right upper lung mass, and a decrease in size of the hepatic and right adrenal metastatic disease. No new signs of disease were appreciated elsewhere in the chest, abdomen, pelvis, or other  appreciable bony structures.   Given her positive response to treatment in the liver, adrenal gland, and lung (as evidenced in her most recent CT), Dr. Julien Nordmann recommends that she continue on the same treatment (Adagrasib) at the same dose and monitor the area around T3 closely.   With regards to the probable disease progression around T4, the patient has endorsed a recent onset of left shoulder blade pain which is near the left side of T4. Dr. Julien Nordmann has recommended consideration for radiation therapy to this area, which we will discuss in detail today.   Of note: The patient presented for an MRI of the brain on 06/28/22 for evaluation of memory changes. This showed mild progression of moderate chronic microvascular ischemic changes of the white matter, and a 5 mm left posterior communicating artery aneurysm. MRI otherwise showed no evidence of intracranial metastatic disease. Dr. Julien Nordmann offered a neurology referral for further evaluation of the left posterior aneurysm, however the patient has declined this offer.   PAST MEDICAL HISTORY:  Past Medical History:  Diagnosis Date   Borderline diabetes mellitus    Bronchogenic cancer of right lung ([place])    Dyslipidemia    History of radiation therapy    right chest 12/21/2020-12/24/2020  Dr Gery Pray   History of radiation therapy    right lung 06/07/2021-06/17/2021  Dr Gery Pray   Hypertension    Polymyalgia rheumatica ([place])     PAST SURGICAL HISTORY:No past surgical history on file.  FAMILY HISTORY:  Family History  Problem Relation Age of Onset   Heart attack Mother    Heart disease Mother    Colon cancer Father    ALS Brother     SOCIAL HISTORY:  Social History   Tobacco Use   Smoking status: Former    Packs/day: 1.00    Years: 43.00    Total pack years: 43.00    Types: Cigarettes    Quit date: 11/23/2008    Years since quitting: 13.6   Smokeless tobacco: Never   Tobacco comments:    quit 2010  Vaping Use   Vaping  Use: Never used  Substance Use Topics   Alcohol use: Not Currently   Drug use: Never    ALLERGIES:  Allergies  Allergen Reactions   Shellfish-Derived Products Hives, Itching and Rash    Other reaction(s): Hives    Augmentin [Amoxicillin-Pot Clavulanate] Nausea And Vomiting    MEDICATIONS:  Current Outpatient Medications  Medication Sig Dispense Refill   acetaminophen (TYLENOL) 650 MG CR tablet Take 1,300 mg by mouth 3 (three) times daily as needed for pain.     adagrasib (KRAZATI) 200 MG tablet Take 3 tablets (600 mg total) by mouth 2 (two) times daily. 180 tablet 0   apixaban (ELIQUIS) 2.5 MG TABS tablet Take 1 tablet (2.5 mg total) by mouth 2 (two) times daily. 60 tablet 1   atenolol (TENORMIN) 25 MG tablet Take 12.5 mg by mouth at bedtime.     Calcium-Vitamin D-Vitamin K (CALCIUM + D) [phone] MG-UNT-MCG CHEW      Cholecalciferol (VITAMIN D) 50 MCG (2000 UT) tablet      diclofenac Sodium (VOLTAREN) 1 % GEL Apply 4 g topically 4 (four) times daily. (Patient taking differently: Apply 4 g  topically 4 (four) times daily as needed (pain).) 150 g 0   dronabinol (MARINOL) 10 MG capsule Take 1 capsule (10 mg total) by mouth daily. 30 capsule 0   escitalopram (LEXAPRO) 20 MG tablet TAKE ONE TABLET BY MOUTH AT BEDTIME 90 tablet 0   fexofenadine (ALLEGRA) 180 MG tablet Take 180 mg by mouth daily.     fluocinonide (LIDEX) 0.05 % external solution Apply topically daily.     folic acid (FOLVITE) 1 MG tablet Take 1 tablet (1 mg total) by mouth daily. 30 tablet 2   HYDROmorphone (DILAUDID) 4 MG tablet Take 4 mg by mouth every 6 (six) hours as needed for severe pain. Quantity of 120 tablets     mirtazapine (REMERON) 15 MG tablet Take 15 mg by mouth at bedtime.     morphine (MS CONTIN) 30 MG 12 hr tablet Take 1 tablet (30 mg total) by mouth every 12 (twelve) hours. 60 tablet 0   Multiple Vitamin (MULTI VITAMIN) TABS      ondansetron (ZOFRAN-ODT) 4 MG disintegrating tablet Take 1 tablet (4 mg  total) by mouth 2 (two) times daily as needed for nausea or vomiting. 20 tablet 1   OVER THE COUNTER MEDICATION Take 2 Cans by mouth 2 (two) times daily as needed (for nutrition). Premier protein     predniSONE (DELTASONE) 5 MG tablet Take 5 mg by mouth daily.     prochlorperazine (COMPAZINE) 10 MG tablet Take 1 tablet (10 mg total) by mouth every 6 (six) hours as needed. 30 tablet 2   Salicylic Acid (SCALPICIN EX) Apply 1 application. topically daily as needed.     senna-docusate (SENOKOT-S) 8.6-50 MG tablet Take 1 tablet by mouth 2 (two) times daily. 30 tablet 2   triamcinolone ointment (KENALOG) 0.1 % Apply 1 application. topically 2 (two) times daily.     No current facility-administered medications for this encounter.    REVIEW OF SYSTEMS:  A 10+ POINT REVIEW OF SYSTEMS WAS OBTAINED including neurology, dermatology, psychiatry, cardiac, respiratory, lymph, extremities, GI, GU, musculoskeletal, constitutional, reproductive, HEENT.  She reports discomfort underneath her left scapula.  This was intense over the past couple of days but has eased off at this time and is only 1 out of 10 intensity.   PHYSICAL EXAM:  height is '5\' 6"'$  (1.676 m) and weight is 91 lb 4 oz (41.4 kg). Her temporal temperature is 97.6 F (36.4 C). Her blood pressure is 116/61 and her pulse is 56 (abnormal). Her respiration is 18 and oxygen saturation is 100%.   General: Alert and oriented, in no acute distress HEENT: Head is normocephalic. Extraocular movements are intact. Neck: Neck is supple, no palpable cervical or supraclavicular lymphadenopathy. Heart: Regular in rate and rhythm with no murmurs, rubs, or gallops. Chest: Clear to auscultation bilaterally, with no rhonchi, wheezes, or rales.  Palpation along the left upper back region reveals no areas of point tenderness.  No palpable abnormality Abdomen: Soft, nontender, nondistended, with no rigidity or guarding. Extremities: No cyanosis or edema. Lymphatics: see  Neck Exam Skin: No concerning lesions. Musculoskeletal: symmetric strength and muscle tone throughout. Neurologic: Cranial nerves II through XII are grossly intact. No obvious focalities. Speech is fluent. Coordination is intact. Psychiatric: Judgment and insight are intact. Affect is appropriate.   ECOG = 1  0 - Asymptomatic (Fully active, able to carry on all predisease activities without restriction)  1 - Symptomatic but completely ambulatory (Restricted in physically strenuous activity but ambulatory and able to carry out  work of a light or sedentary nature. For example, light housework, office work)  2 - Symptomatic, <50% in bed during the day (Ambulatory and capable of all self care but unable to carry out any work activities. Up and about more than 50% of waking hours)  3 - Symptomatic, >50% in bed, but not bedbound (Capable of only limited self-care, confined to bed or chair 50% or more of waking hours)  4 - Bedbound (Completely disabled. Cannot carry on any self-care. Totally confined to bed or chair)  5 - Death   Eustace Pen MM, Creech RH, Tormey DC, et al. [phone]). "Toxicity and response criteria of the [facility]". [place] Oncol. 5 (6): 649-55  LABORATORY DATA:  Lab Results  Component Value Date   WBC 10.9 (H) 07/04/2022   HGB 11.9 (L) 07/04/2022   HCT 34.3 (L) 07/04/2022   MCV 88.4 07/04/2022   PLT 223 07/04/2022   NEUTROABS 7.4 07/04/2022   Lab Results  Component Value Date   NA 133 (L) 07/04/2022   K 5.4 (H) 07/04/2022   CL 95 (L) 07/04/2022   CO2 32 07/04/2022   GLUCOSE 98 07/04/2022   BUN 14 07/04/2022   CREATININE 1.22 (H) 07/04/2022   CALCIUM 9.5 07/04/2022      RADIOGRAPHY: CT Chest W Contrast  Result Date: 07/04/2022 CLINICAL DATA:  Non-small cell lung cancer staging in a 77 year old female. Previously shown to have metastatic disease to the RIGHT adrenal. * Tracking Code: BO * EXAM: CT CHEST, ABDOMEN, AND PELVIS WITH CONTRAST  TECHNIQUE: Multidetector CT imaging of the chest, abdomen and pelvis was performed following the standard protocol during bolus administration of intravenous contrast. RADIATION DOSE REDUCTION: This exam was performed according to the departmental dose-optimization program which includes automated exposure control, adjustment of the mA and/or kV according to patient size and/or use of iterative reconstruction technique. CONTRAST:  74m OMNIPAQUE IOHEXOL 300 MG/ML  SOLN COMPARISON:  Imaging from March 14, 2022 FINDINGS: CT CHEST FINDINGS Cardiovascular: Aortic atherosclerosis and mild aneurysmal caliber of the ascending thoracic aorta 4.2 cm is unchanged. Normal heart size. Scattered coronary artery calcifications. Central pulmonary vasculature is normal caliber. Mediastinum/Nodes: No thoracic inlet, axillary, mediastinal or hilar adenopathy. Esophagus grossly normal. Lungs/Pleura: Masslike consolidation in the RIGHT upper lobe measuring 3.2 x 3.3 cm previous the 3.7 x 3.0 cm. No signs of pleural effusion or gross pleural nodularity. Cavitary area along the inferior margin of the consolidative change it extends to the RIGHT hilum from apex measuring 2.0 x 1.9 cm, previously 2.1 x 1.9 cm when measured in a similar fashion including medial soft tissue. No new signs of suspicious pulmonary nodule or mass. Musculoskeletal: See below for full musculoskeletal details. No chest wall mass. CT ABDOMEN PELVIS FINDINGS Hepatobiliary: Invasion of the posterior RIGHT hepatic margin from an adrenal metastasis measuring 15 mm as compared to 25 mm greatest axial dimension. Subtle area of hypodensity in the posterior RIGHT hepatic lobe 12 mm, stable compared to previous imaging. No new hepatic lesion. No pericholecystic stranding or sign of biliary duct dilation. Pancreas: Normal, without mass, inflammation or ductal dilatation. Spleen: Normal. Adrenals/Urinary Tract: Adrenal gland mass on the RIGHT 26 mm as compared to 40 mm  greatest axial dimension (image 49/2) LEFT adrenal gland grossly normal, not well evaluated due to cachexia. No suspicious renal lesion or sign of hydronephrosis. Urinary bladder is collapsed without adjacent stranding. Stomach/Bowel: No acute gastrointestinal findings. Moderate stool in the colon. Vascular/Lymphatic: Aortic atherosclerosis. No sign of  aneurysm. Smooth contour of the IVC. There is no gastrohepatic or hepatoduodenal ligament lymphadenopathy. No retroperitoneal or mesenteric lymphadenopathy. No pelvic sidewall lymphadenopathy. Atherosclerotic changes are moderate marked. Reproductive: Not well evaluated, grossly unremarkable. Other: No ascites.  No pneumoperitoneum. Musculoskeletal: Soft tissue along the LEFT lateral aspect of T4. This rib reliable shows increasing sclerosis since previous imaging. Soft tissue measuring 8 mm on image 10/2 is a new finding. Unchanged compression fracture at T4. Unchanged compression fracture at T8 with central lucency suggesting association with metastatic process. Loss of height at this level slightly greater than 50%. T12 with vertebral destruction along the posterior aspect of the fracture also compatible with pathologic fracture and greater than 50% loss of height. Angulation which is slight and kyphotic at the level of T12 is unchanged. Pathologic fracture of the sternum also unchanged with lucency in the upper sternal body. No new signs of disease elsewhere. Anterolateral RIGHT third rib destruction similar to previous imaging as well. IMPRESSION: 1. Decreased size of pulmonary mass an area of cavitation in the RIGHT upper chest. 2. Decreased size of hepatic and RIGHT adrenal metastasis. 3. New soft tissue adjacent to T3 above the pathologic fracture at T4 and along the upper margin of T4 suspicious for disease extension along the vertebral bodies at this level with otherwise similar appearance of pathologic fractures associated with bony destruction at T8 and  T12 as well as the sternal manubrium. 4. No new signs of disease elsewhere. 5. Aortic atherosclerosis and mild aneurysmal caliber of the ascending thoracic aorta 4.2 cm is unchanged. Attention on subsequent imaging. 6. Coronary artery calcifications. Aortic Atherosclerosis (ICD10-I70.0). Electronically Signed   By: Zetta Bills M.D.   On: 07/04/2022 17:08   CT Abdomen Pelvis W Contrast  Result Date: 07/04/2022 CLINICAL DATA:  Non-small cell lung cancer staging in a 77 year old female. Previously shown to have metastatic disease to the RIGHT adrenal. * Tracking Code: BO * EXAM: CT CHEST, ABDOMEN, AND PELVIS WITH CONTRAST TECHNIQUE: Multidetector CT imaging of the chest, abdomen and pelvis was performed following the standard protocol during bolus administration of intravenous contrast. RADIATION DOSE REDUCTION: This exam was performed according to the departmental dose-optimization program which includes automated exposure control, adjustment of the mA and/or kV according to patient size and/or use of iterative reconstruction technique. CONTRAST:  46m OMNIPAQUE IOHEXOL 300 MG/ML  SOLN COMPARISON:  Imaging from March 14, 2022 FINDINGS: CT CHEST FINDINGS Cardiovascular: Aortic atherosclerosis and mild aneurysmal caliber of the ascending thoracic aorta 4.2 cm is unchanged. Normal heart size. Scattered coronary artery calcifications. Central pulmonary vasculature is normal caliber. Mediastinum/Nodes: No thoracic inlet, axillary, mediastinal or hilar adenopathy. Esophagus grossly normal. Lungs/Pleura: Masslike consolidation in the RIGHT upper lobe measuring 3.2 x 3.3 cm previous the 3.7 x 3.0 cm. No signs of pleural effusion or gross pleural nodularity. Cavitary area along the inferior margin of the consolidative change it extends to the RIGHT hilum from apex measuring 2.0 x 1.9 cm, previously 2.1 x 1.9 cm when measured in a similar fashion including medial soft tissue. No new signs of suspicious pulmonary nodule  or mass. Musculoskeletal: See below for full musculoskeletal details. No chest wall mass. CT ABDOMEN PELVIS FINDINGS Hepatobiliary: Invasion of the posterior RIGHT hepatic margin from an adrenal metastasis measuring 15 mm as compared to 25 mm greatest axial dimension. Subtle area of hypodensity in the posterior RIGHT hepatic lobe 12 mm, stable compared to previous imaging. No new hepatic lesion. No pericholecystic stranding or sign of biliary duct dilation. Pancreas:  Normal, without mass, inflammation or ductal dilatation. Spleen: Normal. Adrenals/Urinary Tract: Adrenal gland mass on the RIGHT 26 mm as compared to 40 mm greatest axial dimension (image 49/2) LEFT adrenal gland grossly normal, not well evaluated due to cachexia. No suspicious renal lesion or sign of hydronephrosis. Urinary bladder is collapsed without adjacent stranding. Stomach/Bowel: No acute gastrointestinal findings. Moderate stool in the colon. Vascular/Lymphatic: Aortic atherosclerosis. No sign of aneurysm. Smooth contour of the IVC. There is no gastrohepatic or hepatoduodenal ligament lymphadenopathy. No retroperitoneal or mesenteric lymphadenopathy. No pelvic sidewall lymphadenopathy. Atherosclerotic changes are moderate marked. Reproductive: Not well evaluated, grossly unremarkable. Other: No ascites.  No pneumoperitoneum. Musculoskeletal: Soft tissue along the LEFT lateral aspect of T4. This rib reliable shows increasing sclerosis since previous imaging. Soft tissue measuring 8 mm on image 10/2 is a new finding. Unchanged compression fracture at T4. Unchanged compression fracture at T8 with central lucency suggesting association with metastatic process. Loss of height at this level slightly greater than 50%. T12 with vertebral destruction along the posterior aspect of the fracture also compatible with pathologic fracture and greater than 50% loss of height. Angulation which is slight and kyphotic at the level of T12 is unchanged. Pathologic  fracture of the sternum also unchanged with lucency in the upper sternal body. No new signs of disease elsewhere. Anterolateral RIGHT third rib destruction similar to previous imaging as well. IMPRESSION: 1. Decreased size of pulmonary mass an area of cavitation in the RIGHT upper chest. 2. Decreased size of hepatic and RIGHT adrenal metastasis. 3. New soft tissue adjacent to T3 above the pathologic fracture at T4 and along the upper margin of T4 suspicious for disease extension along the vertebral bodies at this level with otherwise similar appearance of pathologic fractures associated with bony destruction at T8 and T12 as well as the sternal manubrium. 4. No new signs of disease elsewhere. 5. Aortic atherosclerosis and mild aneurysmal caliber of the ascending thoracic aorta 4.2 cm is unchanged. Attention on subsequent imaging. 6. Coronary artery calcifications. Aortic Atherosclerosis (ICD10-I70.0). Electronically Signed   By: Zetta Bills M.D.   On: 07/04/2022 17:08   MR Brain W Wo Contrast  Result Date: 06/30/2022 CLINICAL DATA:  Memory change R41.3 (ICD-10-CM). Metastatic disease evaluation; Lung cancer, memory changes, Rest[place]. Non-small cell carcinoma of lung, stage 4, right (HCC) C34.91 (ICD-10-CM).02.356 EXAM: MRI HEAD WITHOUT AND WITH CONTRAST TECHNIQUE: Multiplanar, multiecho pulse sequences of the brain and surrounding structures were obtained without and with intravenous contrast. CONTRAST:  77m GADAVIST GADOBUTROL 1 MMOL/ML IV SOLN COMPARISON:  MRI of the brain November 15, 2020. FINDINGS: Brain: No acute infarction, hemorrhage, hydrocephalus, extra-axial collection or mass lesion. Scattered and confluent foci of T2 hyperintensity are seen within the white matter of the cerebral hemispheres, nonspecific, most likely related to chronic small vessel ischemia, mildly progressed when compared to prior MRI. Moderate to advanced parenchymal volume loss. No focus of abnormal contrast enhancement identified.  Vascular: Major intracranial flow voids are preserved. A 5 x 4 mm left posterior communicating artery aneurysm is identified. Skull and upper cervical spine: Normal marrow signal. Sinuses/Orbits: Mild mucosal thickening of the right sphenoid sinus with bubbly secretion. Trace mucosal thickening of the ethmoid cells and maxillary sinuses. The orbits are maintained. Other: None. IMPRESSION: 1. No evidence of intracranial metastatic disease. 2. Moderate chronic microvascular ischemic changes of the white matter, mildly progressed when compared to prior MRI. 3. Moderate to advanced parenchymal volume loss. 4. A 5 mm left posterior communicating artery aneurysm. Correlation with  MR angiogram suggested. Electronically Signed   By: Pedro Earls M.D.   On: 06/30/2022 09:33      IMPRESSION: Stage IV (T3, N2, M1 C) non-small cell lung cancer with unknown histologic subtype.  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  Recent evidence of disease extension about the T4 vertebra  I reviewed the patient's most recent imaging study with diagnostic radiology staff.  I also showed the patient and her husband the pertinent images.  The area of concern is a small soft tissue component adjacent to the left portion of the vertebral body.  All other sites of disease appears stable.  I discussed with the patient that her discomfort in the left upper back would not likely be caused by this issue.  There does not appear to be any nerve root issues and actually her pain has essentially resolved at this time.  She attributes this to likely muscle strain.  I therefore recommended we hold off on palliative radiation therapy.  She will proceed with CT scans in approximately 3 months and if this area progresses significantly we will reconsider palliative radiation  therapy.   PLAN: As needed follow-up in radiation oncology   35 minutes of total time was spent for this patient encounter, including preparation, face-to-face counseling with the patient and coordination of care, physical exam, and documentation of the encounter.   ------------------------------------------------  Blair Promise, PhD, MD  This document serves as a record of services personally performed by Gery Pray, MD. It was created on his behalf by Roney Mans, a trained medical scribe. The creation of this record is based on the scribe's personal observations and the provider's statements to them. This document has been checked and approved by the attending provider.

## 2022-07-27 ENCOUNTER — Telehealth: Payer: Self-pay

## 2022-07-27 ENCOUNTER — Other Ambulatory Visit: Payer: Self-pay

## 2022-07-27 ENCOUNTER — Ambulatory Visit
Admission: RE | Admit: 2022-07-27 | Discharge: 2022-07-27 | Disposition: A | Discharge status: Home / Self Care | Payer: Medicare HMO | Source: Ambulatory Visit | Attending: Radiation Oncology | Admitting: Radiation Oncology

## 2022-07-27 ENCOUNTER — Other Ambulatory Visit: Payer: Self-pay | Admitting: Nurse Practitioner

## 2022-07-27 VITALS — BP 116/61 | HR 56 | Temp 97.6°F | Resp 18 | Ht 66.0 in | Wt 91.2 lb

## 2022-07-27 DIAGNOSIS — R590 Localized enlarged lymph nodes: Secondary | ICD-10-CM | POA: Insufficient documentation

## 2022-07-27 DIAGNOSIS — C3491 Malignant neoplasm of unspecified part of right bronchus or lung: Secondary | ICD-10-CM

## 2022-07-27 DIAGNOSIS — C787 Secondary malignant neoplasm of liver and intrahepatic bile duct: Secondary | ICD-10-CM | POA: Diagnosis not present

## 2022-07-27 DIAGNOSIS — Z791 Long term (current) use of non-steroidal anti-inflammatories (NSAID): Secondary | ICD-10-CM | POA: Diagnosis not present

## 2022-07-27 DIAGNOSIS — C7951 Secondary malignant neoplasm of bone: Secondary | ICD-10-CM | POA: Diagnosis not present

## 2022-07-27 DIAGNOSIS — I10 Essential (primary) hypertension: Secondary | ICD-10-CM | POA: Diagnosis not present

## 2022-07-27 DIAGNOSIS — Z87891 Personal history of nicotine dependence: Secondary | ICD-10-CM | POA: Diagnosis not present

## 2022-07-27 DIAGNOSIS — M353 Polymyalgia rheumatica: Secondary | ICD-10-CM | POA: Diagnosis not present

## 2022-07-27 DIAGNOSIS — I7121 Aneurysm of the ascending aorta, without rupture: Secondary | ICD-10-CM | POA: Diagnosis not present

## 2022-07-27 DIAGNOSIS — Z7901 Long term (current) use of anticoagulants: Secondary | ICD-10-CM | POA: Diagnosis not present

## 2022-07-27 DIAGNOSIS — E785 Hyperlipidemia, unspecified: Secondary | ICD-10-CM | POA: Diagnosis not present

## 2022-07-27 DIAGNOSIS — I2510 Atherosclerotic heart disease of native coronary artery without angina pectoris: Secondary | ICD-10-CM | POA: Diagnosis not present

## 2022-07-27 DIAGNOSIS — I700 Atherosclerosis of aorta: Secondary | ICD-10-CM | POA: Diagnosis not present

## 2022-07-27 DIAGNOSIS — C3411 Malignant neoplasm of upper lobe, right bronchus or lung: Secondary | ICD-10-CM | POA: Insufficient documentation

## 2022-07-27 DIAGNOSIS — C7971 Secondary malignant neoplasm of right adrenal gland: Secondary | ICD-10-CM | POA: Diagnosis not present

## 2022-07-27 DIAGNOSIS — Z7952 Long term (current) use of systemic steroids: Secondary | ICD-10-CM | POA: Diagnosis not present

## 2022-07-27 DIAGNOSIS — Z800 Family history of malignant neoplasm of digestive organs: Secondary | ICD-10-CM | POA: Diagnosis not present

## 2022-07-27 MED ORDER — MORPHINE SULFATE ER 30 MG PO TBCR: 30.0000 mg | EXTENDED_RELEASE_TABLET | Freq: Two times a day (BID) | ORAL | 0 refills | Status: DC

## 2022-07-27 NOTE — Telephone Encounter (Signed)
Pt husband called for a refill, order previously sent, checked with pharmacy, med in process of being filled. Pt husband notified, no further needs at this time.

## 2022-07-31 ENCOUNTER — Other Ambulatory Visit (HOSPITAL_COMMUNITY): Payer: Self-pay

## 2022-08-03 ENCOUNTER — Other Ambulatory Visit: Payer: Self-pay | Admitting: Internal Medicine

## 2022-08-03 ENCOUNTER — Other Ambulatory Visit (HOSPITAL_COMMUNITY): Payer: Self-pay

## 2022-08-03 DIAGNOSIS — C3491 Malignant neoplasm of unspecified part of right bronchus or lung: Secondary | ICD-10-CM

## 2022-08-04 ENCOUNTER — Other Ambulatory Visit: Payer: Self-pay

## 2022-08-04 ENCOUNTER — Other Ambulatory Visit (HOSPITAL_COMMUNITY): Payer: Self-pay

## 2022-08-04 MED ORDER — KRAZATI 200 MG PO TABS
600.0000 mg | ORAL_TABLET | Freq: Two times a day (BID) | ORAL | 0 refills | Status: DC
  Filled 2022-08-04: qty 180, 30d supply, fill #0

## 2022-08-04 NOTE — Progress Notes (Unsigned)
[place]  [phone]) [phone] [phone]) [phone]   Name: SOPHRONIA HERRADA Date: 08/04/2022 MRN: [id number]  DOB: 02-19-1946  Patient Care Team: [name], NP as PCP - General (Family Medicine) [place] as Registered Nurse [facility] and Palliative Medicine) Pickenpack-Cousar, Carlena Sax, NP as Nurse Practitioner (Nurse Practitioner)     INTERVAL HISTORY: MIAMOR PIGOTT is a 77 y.o. 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 Palliative ask to see for symptom management.  SOCIAL HISTORY:     reports that she quit smoking about 13 years ago. Her smoking use included cigarettes. She has a 43.00 pack-year smoking history. She has never used smokeless tobacco. She reports that she does not currently use alcohol. She reports that she does not use drugs.  ADVANCE DIRECTIVES:  None on file   CODE STATUS:   PAST MEDICAL HISTORY: Past Medical History:  Diagnosis Date   Borderline diabetes mellitus    Bronchogenic cancer of right lung ([place])    Dyslipidemia    History of radiation therapy    right chest 12/21/2020-12/24/2020  Dr Gery Pray   History of radiation therapy    right lung 06/07/2021-06/17/2021  Dr Gery Pray   Hypertension    Polymyalgia rheumatica ([place])    ALLERGIES:  is allergic to shellfish-derived products and augmentin [amoxicillin-pot clavulanate].  MEDICATIONS:  Current Outpatient Medications  Medication Sig Dispense Refill   acetaminophen (TYLENOL) 650 MG CR tablet Take 1,300 mg by mouth 3 (three) times daily as needed for pain.     adagrasib (KRAZATI) 200 MG tablet Take 3 tablets (600 mg total) by mouth 2 (two) times daily. 180 tablet 0   apixaban (ELIQUIS) 2.5 MG TABS tablet Take 1 tablet (2.5 mg total) by mouth 2 (two) times daily. 60 tablet 1   atenolol (TENORMIN) 25 MG tablet Take 12.5 mg by  mouth at bedtime.     Calcium-Vitamin D-Vitamin K (CALCIUM + D) [id number] MG-UNT-MCG CHEW      Cholecalciferol (VITAMIN D) 50 MCG (2000 UT) tablet      diclofenac Sodium (VOLTAREN) 1 % GEL Apply 4 g topically 4 (four) times daily. (Patient taking differently: Apply 4 g topically 4 (four) times daily as needed (pain).) 150 g 0   dronabinol (MARINOL) 10 MG capsule Take 1 capsule (10 mg total) by mouth daily. 30 capsule 0   escitalopram (LEXAPRO) 20 MG tablet TAKE ONE TABLET BY MOUTH AT BEDTIME 90 tablet 0   fexofenadine (ALLEGRA) 180 MG tablet Take 180 mg by mouth daily.     fluocinonide (LIDEX) 0.05 % external solution Apply topically daily.     folic acid (FOLVITE) 1 MG tablet Take 1 tablet (1 mg total) by mouth daily. 30 tablet 2   HYDROmorphone (DILAUDID) 4 MG tablet Take 4 mg by mouth every 6 (six) hours as needed for severe pain. Quantity of 120 tablets     mirtazapine (REMERON) 15 MG tablet Take 15 mg by mouth at bedtime.     morphine (MS CONTIN) 30 MG 12 hr tablet Take 1 tablet (30 mg total) by mouth every 12 (twelve) hours. 60 tablet 0   Multiple Vitamin (MULTI VITAMIN) TABS      ondansetron (ZOFRAN-ODT) 4 MG disintegrating tablet Take 1 tablet (4 mg total) by mouth 2 (two) times daily as needed for nausea or vomiting. 20 tablet 1   OVER THE  COUNTER MEDICATION Take 2 Cans by mouth 2 (two) times daily as needed (for nutrition). Premier protein     predniSONE (DELTASONE) 5 MG tablet Take 5 mg by mouth daily.     prochlorperazine (COMPAZINE) 10 MG tablet Take 1 tablet (10 mg total) by mouth every 6 (six) hours as needed. 30 tablet 2   Salicylic Acid (SCALPICIN EX) Apply 1 application. topically daily as needed.     senna-docusate (SENOKOT-S) 8.6-50 MG tablet Take 1 tablet by mouth 2 (two) times daily. 30 tablet 2   triamcinolone ointment (KENALOG) 0.1 % Apply 1 application. topically 2 (two) times daily.     No current facility-administered medications for this visit.    VITAL  SIGNS: There were no vitals taken for this visit. There were no vitals filed for this visit.   Estimated body mass index is 14.73 kg/m as calculated from the following:   Height as of 07/27/22: 5\' 6"  (1.676 m).   Weight as of 07/27/22: 91 lb 4 oz (41.4 kg).  PERFORMANCE STATUS (ECOG) : 1 - Symptomatic but completely ambulatory  Assessment NAD, ambulatory RRR Normal breathing pattern AAO x3, emotional   IMPRESSION: Mrs. Uram presents to clinic today for follow-up. No acute distress. Husband is present. Is remaining as active as possible. Complains of some shoulder arthritic pain. Some relief with heat pack and use of Voltaren. Feels memory and mentation has improved. We discussed spring time changes and getting out of the house more enjoying the sunshine.   Appetite is improved. She is much appreciative of this. Weight is slowly increasing. Todays weight up to 91.5lbs up from 89lbs. She is focusing on her appetite. Eating small frequent meals and snacking as desired.   Pain is well controlled on current regimen. MS Contin twice daily due to ongoing back pain. Will continue to closely monitor and support. No additional symptom management needs at this time.    PLAN:  MS Contin to 30mg  twice daily. We will continue to closely evaluate and began to wean down and eventually discontinue if possible.  Dronabinol 10 mg to once daily for appetite.  Continue mirtazapine 15 mg as prescribed We will continue to closely monitor.  I will plan to see patient back in 4-6 weeks in collaboration with her other appointments.    Patient expressed understanding and was in agreement with this plan. She also understands that She can call the clinic at any time with any questions, concerns, or complaints.     Time Total: 30 min   Visit consisted of counseling and education dealing with the complex and emotionally intense issues of symptom management and palliative care in the setting of serious and  potentially life-threatening illness.Greater than 50%  of this time was spent counseling and coordinating care related to the above assessment and plan.  Alda Lea, AGPCNP-BC  Palliative Medicine Team/[address] [place]

## 2022-08-07 ENCOUNTER — Other Ambulatory Visit: Payer: Self-pay

## 2022-08-07 ENCOUNTER — Other Ambulatory Visit: Payer: Self-pay | Admitting: Family Medicine

## 2022-08-07 DIAGNOSIS — C3491 Malignant neoplasm of unspecified part of right bronchus or lung: Secondary | ICD-10-CM | POA: Diagnosis not present

## 2022-08-07 IMAGING — CT CT KNEE*R* W/CM
3 of 12 series · 8 of 33 positions shown, 9 images · IV contrast (omnipaque)
Comparison: None.

CLINICAL DATA: Technologist note reports right leg mass. Patient
reports she discovered something behind her right knee on DVT
ultrasound.

EXAM:
CT OF THE RIGHT KNEE WITH CONTRAST
TECHNIQUE: Multidetector CT imaging was performed following the standard
protocol during bolus administration of intravenous contrast.
CONTRAST:  60mL OMNIPAQUE IOHEXOL 350 MG/ML SOLN

[Series 7: axial bone · axial · 0.30mm/px · z∈[-708,-606]mm · 3 of 138 slices shown, 4 images]
[im 35/138  soft-tissue]
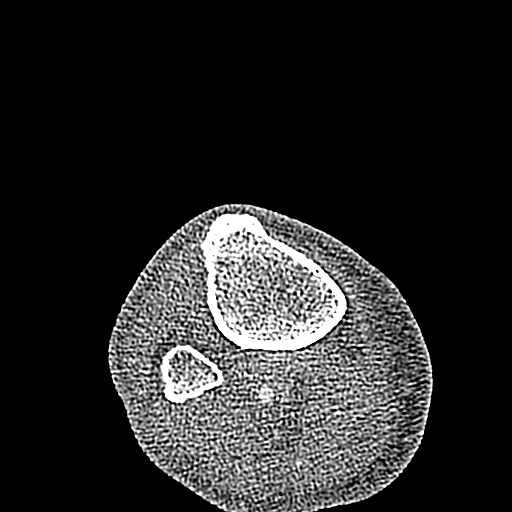
[im 35/138  bone]
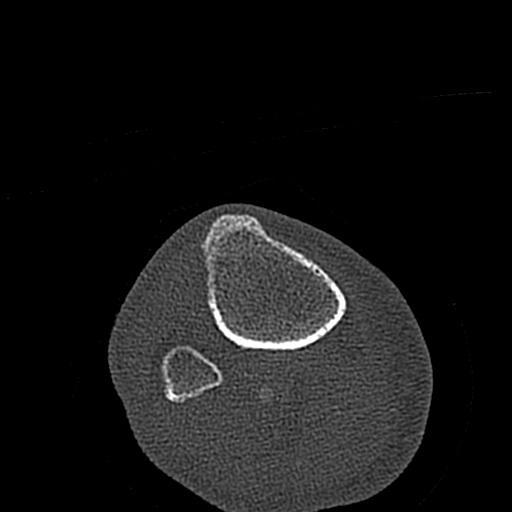
[im 69/138  bone]
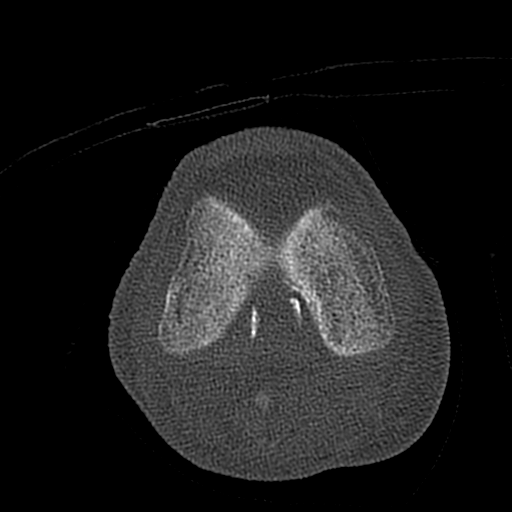
[im 103/138  bone]
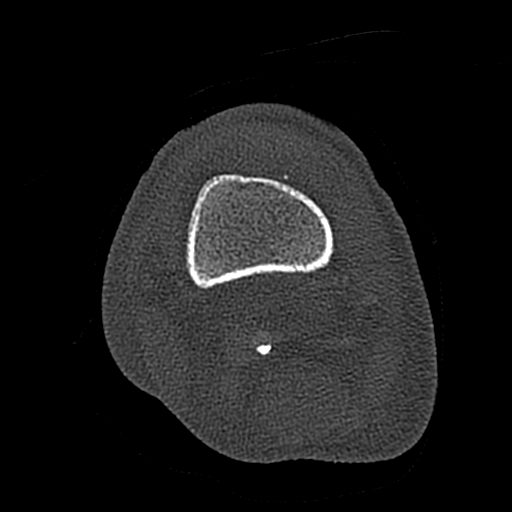

[Series 11: axial st · axial · 0.33mm/px · z∈[-708,-606]mm · 3 of 138 slices shown]
[im 35/138  bone]
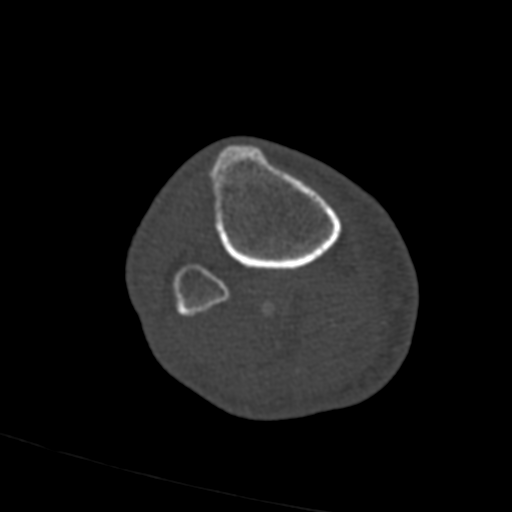
[im 69/138  bone]
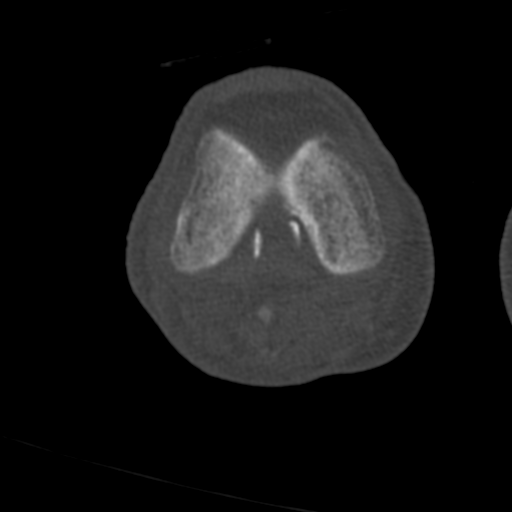
[im 103/138  bone]
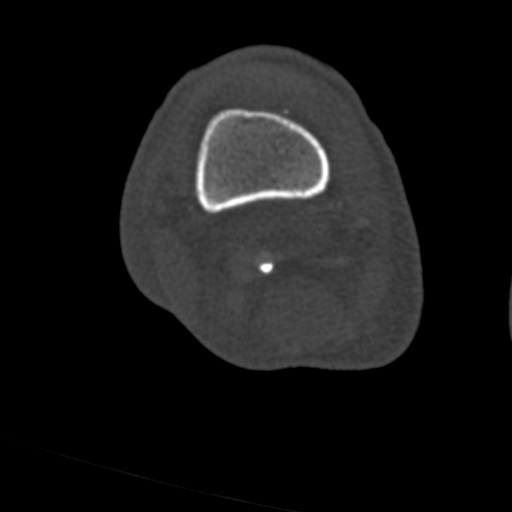

[Series 18: sagittal st · sagittal · 0.33mm/px · 2 of 69 slices shown]
[im 23/69  bone]
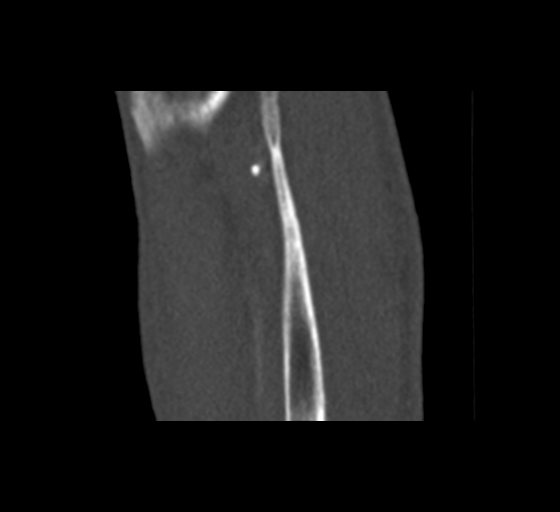
[im 46/69  bone]
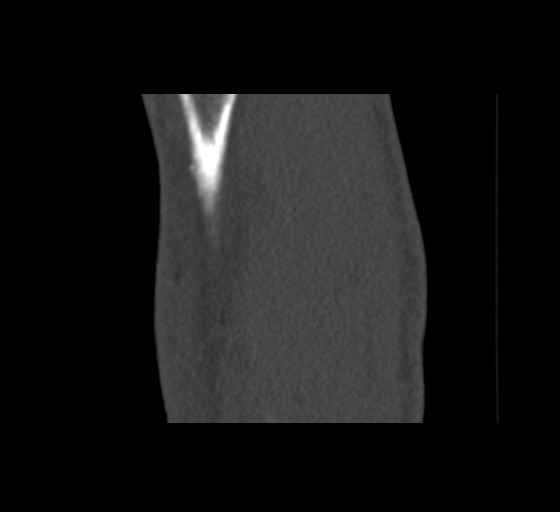

[8 of 33 positions shown; findings below may reference images not displayed]

FINDINGS: Bones/Joint/Cartilage

No acute fracture. No dislocation. Tricompartmental osteoarthritis
of the right knee, moderate within the medial and patellofemoral
compartments with joint space narrowing and small marginal
osteophytes. No knee joint effusion. Bony structures appear
demineralized. Enthesopathy at the quadriceps tendon insertion on
the superior patella. There is a 15 x 8 mm ossified loose body
within the subpopliteal joint recess.

Ligaments

Suboptimally assessed by CT.

Muscles and Tendons

Musculotendinous structures appear intact by CT.

Soft tissues

Well-defined fluid collection at the posteromedial aspect of the
knee extending into the proximal calf measures approximately 12.0 x
1.5 x 2.8 cm. Collection appears to arise from the posterior joint
line of the knee between the medial head of the gastrocnemius and
semimembranosus tendons, compatible with a Baker's cyst. Collection
appears to extend partially intramuscular within the medial
gastrocnemius. No evidence of a solid mass. Prominent
atherosclerotic vascular calcifications.
IMPRESSION: 1. Well-defined fluid collection at the posteromedial aspect of the
knee extending into the proximal calf measures approximately 12.0 x
1.5 x 2.8 cm, compatible with a Baker's cyst.
2. Tricompartmental osteoarthritis of the right knee, moderate
within the medial and patellofemoral compartments.
3. Incidental note of a 1.5 cm ossified loose body within the
subpopliteal joint recess.

## 2022-08-08 ENCOUNTER — Inpatient Hospital Stay: Payer: Medicare HMO | Attending: Nurse Practitioner

## 2022-08-08 ENCOUNTER — Encounter: Payer: Self-pay | Admitting: Nurse Practitioner

## 2022-08-08 ENCOUNTER — Encounter: Payer: Self-pay | Admitting: Internal Medicine

## 2022-08-08 ENCOUNTER — Inpatient Hospital Stay (HOSPITAL_BASED_OUTPATIENT_CLINIC_OR_DEPARTMENT_OTHER): Payer: Medicare HMO | Admitting: Nurse Practitioner

## 2022-08-08 ENCOUNTER — Other Ambulatory Visit: Payer: Self-pay

## 2022-08-08 ENCOUNTER — Inpatient Hospital Stay: Payer: Medicare HMO | Admitting: Internal Medicine

## 2022-08-08 VITALS — BP 152/74 | HR 52 | Temp 97.8°F | Resp 18 | Wt 91.3 lb

## 2022-08-08 DIAGNOSIS — C7971 Secondary malignant neoplasm of right adrenal gland: Secondary | ICD-10-CM | POA: Diagnosis not present

## 2022-08-08 DIAGNOSIS — E875 Hyperkalemia: Secondary | ICD-10-CM | POA: Insufficient documentation

## 2022-08-08 DIAGNOSIS — Z7901 Long term (current) use of anticoagulants: Secondary | ICD-10-CM | POA: Insufficient documentation

## 2022-08-08 DIAGNOSIS — M792 Neuralgia and neuritis, unspecified: Secondary | ICD-10-CM | POA: Diagnosis not present

## 2022-08-08 DIAGNOSIS — Z515 Encounter for palliative care: Secondary | ICD-10-CM

## 2022-08-08 DIAGNOSIS — Z86718 Personal history of other venous thrombosis and embolism: Secondary | ICD-10-CM | POA: Diagnosis not present

## 2022-08-08 DIAGNOSIS — G893 Neoplasm related pain (acute) (chronic): Secondary | ICD-10-CM

## 2022-08-08 DIAGNOSIS — I10 Essential (primary) hypertension: Secondary | ICD-10-CM | POA: Insufficient documentation

## 2022-08-08 DIAGNOSIS — C3491 Malignant neoplasm of unspecified part of right bronchus or lung: Secondary | ICD-10-CM

## 2022-08-08 DIAGNOSIS — C786 Secondary malignant neoplasm of retroperitoneum and peritoneum: Secondary | ICD-10-CM | POA: Insufficient documentation

## 2022-08-08 DIAGNOSIS — R630 Anorexia: Secondary | ICD-10-CM | POA: Diagnosis not present

## 2022-08-08 DIAGNOSIS — C3411 Malignant neoplasm of upper lobe, right bronchus or lung: Secondary | ICD-10-CM | POA: Insufficient documentation

## 2022-08-08 DIAGNOSIS — N289 Disorder of kidney and ureter, unspecified: Secondary | ICD-10-CM | POA: Insufficient documentation

## 2022-08-08 DIAGNOSIS — R634 Abnormal weight loss: Secondary | ICD-10-CM

## 2022-08-08 DIAGNOSIS — C7951 Secondary malignant neoplasm of bone: Secondary | ICD-10-CM | POA: Insufficient documentation

## 2022-08-08 LAB — CMP (CANCER CENTER ONLY)
ALT: 18 U/L (ref 0–44)
AST: 36 U/L (ref 15–41)
Albumin: 4 g/dL (ref 3.5–5.0)
Alkaline Phosphatase: 62 U/L (ref 38–126)
Anion gap: 6 (ref 5–15)
BUN: 23 mg/dL (ref 8–23)
CO2: 32 mmol/L (ref 22–32)
Calcium: 9.3 mg/dL (ref 8.9–10.3)
Chloride: 99 mmol/L (ref 98–111)
Creatinine: 1.49 mg/dL — ABNORMAL HIGH (ref 0.44–1.00)
GFR, Estimated: 36 mL/min — ABNORMAL LOW (ref >60)
Glucose, Bld: 94 mg/dL (ref 70–99)
Potassium: 5.3 mmol/L — ABNORMAL HIGH (ref 3.5–5.1)
Sodium: 137 mmol/L (ref 135–145)
Total Bilirubin: 0.6 mg/dL (ref 0.3–1.2)
Total Protein: 7 g/dL (ref 6.5–8.1)

## 2022-08-08 LAB — CBC WITH DIFFERENTIAL (CANCER CENTER ONLY)
Abs Immature Granulocytes: 0.11 10*3/uL — ABNORMAL HIGH (ref 0.00–0.07)
Basophils Absolute: 0 10*3/uL (ref 0.0–0.1)
Basophils Relative: 0 %
Eosinophils Absolute: 0.1 10*3/uL (ref 0.0–0.5)
Eosinophils Relative: 1 %
HCT: 31.9 % — ABNORMAL LOW (ref 36.0–46.0)
Hemoglobin: 10.6 g/dL — ABNORMAL LOW (ref 12.0–15.0)
Immature Granulocytes: 1 %
Lymphocytes Relative: 19 %
Lymphs Abs: 1.7 10*3/uL (ref 0.7–4.0)
MCH: 30.8 pg (ref 26.0–34.0)
MCHC: 33.2 g/dL (ref 30.0–36.0)
MCV: 92.7 fL (ref 80.0–100.0)
Monocytes Absolute: 1.4 10*3/uL — ABNORMAL HIGH (ref 0.1–1.0)
Monocytes Relative: 16 %
Neutro Abs: 5.5 10*3/uL (ref 1.7–7.7)
Neutrophils Relative %: 63 %
Platelet Count: 172 10*3/uL (ref 150–400)
RBC: 3.44 MIL/uL — ABNORMAL LOW (ref 3.87–5.11)
RDW: 14.2 % (ref 11.5–15.5)
WBC Count: 8.9 10*3/uL (ref 4.0–10.5)
nRBC: 0 % (ref 0.0–0.2)

## 2022-08-08 NOTE — Progress Notes (Signed)
[place] [phone]   [phone]) [phone]  OFFICE PROGRESS NOTE  [name], NP [address] [place] [address]  DIAGNOSIS: Stage IV (T3, N2, M1 C) non-small cell lung cancer with unknown histologic subtype.  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   DETECTED ALTERATION(S) / BIOMARKER(S)      % CFDNA OR AMPLIFICATION        ASSOCIATED FDA-APPROVED THERAPIES         CLINICAL TRIAL AVAILABILITY [id number] 8.9%   Sotorasib Yes [id number] 7.2% None     Yes     PRIOR THERAPY:   1) Palliative radiotherapy to the painful bone lesions under the care of Dr. Sondra Come.  2) Palliative systemic chemotherapy with carboplatin for AUC of 5, Alimta 500 Mg/M2 and Keytruda 200 Mg IV every 3 weeks.  First dose of treatment on December 29, 2020. Status post 18  cycles.  Starting from cycle #5, the patient is going to start maintenance Alimta and Keytruda. Keytruda discontinued from cycle #7 due to myalgias.  Last cycle was given on December 20, 2021 discontinued secondary to disease progression. 3) Possible palliative radiation to the enlarging pulmonary mass under the care of Dr. Sondra Come.   CURRENT THERAPY: 1) Krazati (Adagrasib) 600 Mg p.o. twice daily.  First dose started January 17, 2022.  Status post 8 months of treatment. 2) Zometa every 6 weeks starting on 02/10/21  INTERVAL HISTORY: Kristi Jennings 77 y.o. female return to the clinic today for follow-up visit accompanied by her husband.  The patient is feeling fine today with no concerning complaints.  She gained few pounds recently.  She denied having any current chest pain, shortness of breath, cough or hemoptysis.  She has no nausea, vomiting, diarrhea or constipation.  She has no headache or visual changes.  She has been tolerating her  treatment with Alfred Levins (Adagrasib) fairly well.  She is here today for evaluation and repeat blood work.  MEDICAL HISTORY: Past Medical History:  Diagnosis Date   Borderline diabetes mellitus    Bronchogenic cancer of right lung ([place])    Dyslipidemia    History of radiation therapy    right chest 12/21/2020-12/24/2020  Dr Gery Pray   History of radiation therapy    right lung 06/07/2021-06/17/2021  Dr Gery Pray   Hypertension    Polymyalgia rheumatica ([place])     ALLERGIES:  is allergic to shellfish-derived products and augmentin [amoxicillin-pot clavulanate].  MEDICATIONS:  Current Outpatient Medications  Medication Sig Dispense Refill   acetaminophen (TYLENOL) 650 MG CR tablet Take 1,300 mg by mouth 3 (three) times daily as needed for pain.     adagrasib (KRAZATI) 200 MG tablet Take 3 tablets (600 mg total) by mouth 2 (two) times daily. 180 tablet 0   apixaban (ELIQUIS) 2.5 MG TABS tablet Take 1 tablet (2.5 mg total) by mouth 2 (two) times daily. 60 tablet 1   atenolol (TENORMIN) 25 MG tablet Take 12.5 mg by mouth at bedtime.     Calcium-Vitamin D-Vitamin K (CALCIUM + D) [phone] MG-UNT-MCG CHEW      Cholecalciferol (VITAMIN D) 50 MCG (2000 UT) tablet      diclofenac Sodium (VOLTAREN) 1 % GEL Apply 4 g topically 4 (four) times daily. (Patient taking differently: Apply 4 g topically 4 (  four) times daily as needed (pain).) 150 g 0   dronabinol (MARINOL) 10 MG capsule Take 1 capsule (10 mg total) by mouth daily. 30 capsule 0   escitalopram (LEXAPRO) 20 MG tablet Take 1 tablet (20 mg total) by mouth at bedtime. 30 tablet 0   fexofenadine (ALLEGRA) 180 MG tablet Take 180 mg by mouth daily.     fluocinonide (LIDEX) 0.05 % external solution Apply topically daily.     folic acid (FOLVITE) 1 MG tablet Take 1 tablet (1 mg total) by mouth daily. 30 tablet 2   HYDROmorphone (DILAUDID) 4 MG tablet Take 4 mg by mouth every 6 (six) hours as needed for severe pain. Quantity of 120 tablets      mirtazapine (REMERON) 15 MG tablet Take 15 mg by mouth at bedtime.     morphine (MS CONTIN) 30 MG 12 hr tablet Take 1 tablet (30 mg total) by mouth every 12 (twelve) hours. 60 tablet 0   Multiple Vitamin (MULTI VITAMIN) TABS      ondansetron (ZOFRAN-ODT) 4 MG disintegrating tablet Take 1 tablet (4 mg total) by mouth 2 (two) times daily as needed for nausea or vomiting. 20 tablet 1   OVER THE COUNTER MEDICATION Take 2 Cans by mouth 2 (two) times daily as needed (for nutrition). Premier protein     predniSONE (DELTASONE) 5 MG tablet Take 5 mg by mouth daily.     prochlorperazine (COMPAZINE) 10 MG tablet Take 1 tablet (10 mg total) by mouth every 6 (six) hours as needed. 30 tablet 2   Salicylic Acid (SCALPICIN EX) Apply 1 application. topically daily as needed.     senna-docusate (SENOKOT-S) 8.6-50 MG tablet Take 1 tablet by mouth 2 (two) times daily. 30 tablet 2   triamcinolone ointment (KENALOG) 0.1 % Apply 1 application. topically 2 (two) times daily.     No current facility-administered medications for this visit.    SURGICAL HISTORY: No past surgical history on file.  REVIEW OF SYSTEMS:  A comprehensive review of systems was negative except for: Constitutional: positive for fatigue   PHYSICAL EXAMINATION: General appearance: alert, cooperative, fatigued, and no distress Head: Normocephalic, without obvious abnormality, atraumatic Neck: no adenopathy, no JVD, supple, symmetrical, trachea midline, and thyroid not enlarged, symmetric, no tenderness/mass/nodules Lymph nodes: Cervical, supraclavicular, and axillary nodes normal. Resp: clear to auscultation bilaterally Back: symmetric, no curvature. ROM normal. No CVA tenderness. Cardio: regular rate and rhythm, S1, S2 normal, no murmur, click, rub or gallop GI: soft, non-tender; bowel sounds normal; no masses,  no organomegaly Extremities: extremities normal, atraumatic, no cyanosis or edema  ECOG PERFORMANCE STATUS: 1 - Symptomatic but  completely ambulatory  Blood pressure (!) 152/74, pulse (!) 52, temperature 97.8 F (36.6 C), temperature source Oral, resp. rate 18, weight 91 lb 5 oz (41.4 kg), SpO2 100 %.  LABORATORY DATA: Lab Results  Component Value Date   WBC 8.9 08/08/2022   HGB 10.6 (L) 08/08/2022   HCT 31.9 (L) 08/08/2022   MCV 92.7 08/08/2022   PLT 172 08/08/2022      Chemistry      Component Value Date/Time   NA 133 (L) 07/04/2022 1043   K 5.4 (H) 07/04/2022 1043   CL 95 (L) 07/04/2022 1043   CO2 32 07/04/2022 1043   BUN 14 07/04/2022 1043   CREATININE 1.22 (H) 07/04/2022 1043      Component Value Date/Time   CALCIUM 9.5 07/04/2022 1043   ALKPHOS 65 07/04/2022 1043   AST 30 07/04/2022 1043  ALT 15 07/04/2022 1043   BILITOT 0.6 07/04/2022 1043       RADIOGRAPHIC STUDIES: No results found.  ASSESSMENT AND PLAN: This is a very pleasant 77 years old white female diagnosed with Stage IV (T3, N2, M1 C) non-small cell lung cancer with unknown histologic subtype, likely adenocarcinoma with positive KRAS G12C mutation.  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  Status post palliative radiotherapy to the painful bone lesions under the care of Dr. Sondra Come.  The patient started induction systemic chemotherapy with carboplatin for AUC of 5, Alimta 500 Mg/M2 and Keytruda 200 Mg IV every 3 weeks and currently on maintenance treatment with single agent Alimta after Keytruda was discontinued starting from cycle #7 because of significant arthralgia and myalgia.  She is status post 18 cycles of total treatment.  The patient has been tolerating this treatment well except for fatigue. She underwent palliative radiotherapy to enlarging pulmonary nodule under the care of Dr. Sondra Come. Unfortunately her repeat imaging studies with CT scan showed evidence for  disease progression with increase in the size of the apical right lung mass in addition to new and enlarging pulmonary metastasis as well as significant increase in the large right adrenal metastasis and new metastatic adenopathy to the retroperitoneum. The patient started treatment with Krazati (Adagrasib) 600 mg p.o. twice daily on January 17, 2022.  Status post 8 months of treatment. She has been tolerating this treatment well with no concerning adverse effect except for mild fatigue. Her lab work today is unremarkable except for the persistent mild renal insufficiency and mild hyperkalemia. I recommended for the patient to continue her current treatment with Alfred Levins (Adagrasib) with the same dose. For the history of deep venous thrombosis, she is currently on Eliquis 5 mg p.o. twice daily. For the lack of appetite she is currently on Marinol and Remeron but there was a backorder of Marinol and she was unable to get it recently. For the bone metastasis, she is currently on Zometa every 6 weeks. For the renal insufficiency, I encouraged her to increase her oral intake of fluid and will continue to monitor it closely for now. I will see her back for follow-up visit in 1 months for evaluation and repeat blood work. She was advised to call immediately if she has any other concerning symptoms in the interval. The patient voices understanding of current disease status and treatment options and is in agreement with the current care plan.  All questions were answered. The patient knows to call the clinic with any problems, questions or concerns. We can certainly see the patient much sooner if necessary.  The total time spent in the appointment was 20 minutes.  Disclaimer: This note was dictated with voice recognition software. Similar sounding words can inadvertently be transcribed and may not be corrected upon review.

## 2022-08-13 DIAGNOSIS — C3491 Malignant neoplasm of unspecified part of right bronchus or lung: Secondary | ICD-10-CM | POA: Diagnosis not present

## 2022-08-17 DIAGNOSIS — M546 Pain in thoracic spine: Secondary | ICD-10-CM | POA: Diagnosis not present

## 2022-08-19 ENCOUNTER — Telehealth: Payer: Self-pay | Admitting: Internal Medicine

## 2022-08-19 NOTE — Telephone Encounter (Signed)
Called patient regarding April appointments, patient is notified. 

## 2022-08-22 ENCOUNTER — Other Ambulatory Visit: Payer: Self-pay

## 2022-08-22 DIAGNOSIS — C7951 Secondary malignant neoplasm of bone: Secondary | ICD-10-CM

## 2022-08-22 DIAGNOSIS — C3491 Malignant neoplasm of unspecified part of right bronchus or lung: Secondary | ICD-10-CM

## 2022-08-22 MED ORDER — MORPHINE SULFATE ER 30 MG PO TBCR: 30.0000 mg | EXTENDED_RELEASE_TABLET | Freq: Two times a day (BID) | ORAL | 0 refills | Status: DC

## 2022-08-22 NOTE — Telephone Encounter (Signed)
Pt husband, craig, called for a refill of morphine. Refill sent. [name] also mentioned increased arthritic pain in patients bones when she is up and moving around. [name] reported that pt has an appt with her rheumatologist on Thursday to address this. Ardelle Park, NP made aware, will follow up with scheduled appt.

## 2022-08-24 ENCOUNTER — Telehealth: Payer: Self-pay | Admitting: Medical Oncology

## 2022-08-24 DIAGNOSIS — M154 Erosive (osteo)arthritis: Secondary | ICD-10-CM | POA: Diagnosis not present

## 2022-08-24 DIAGNOSIS — C3490 Malignant neoplasm of unspecified part of unspecified bronchus or lung: Secondary | ICD-10-CM | POA: Diagnosis not present

## 2022-08-24 DIAGNOSIS — Z681 Body mass index (BMI) 19 or less, adult: Secondary | ICD-10-CM | POA: Diagnosis not present

## 2022-08-24 DIAGNOSIS — M810 Age-related osteoporosis without current pathological fracture: Secondary | ICD-10-CM | POA: Diagnosis not present

## 2022-08-24 DIAGNOSIS — S239XXD Sprain of unspecified parts of thorax, subsequent encounter: Secondary | ICD-10-CM | POA: Diagnosis not present

## 2022-08-24 DIAGNOSIS — M0579 Rheumatoid arthritis with rheumatoid factor of multiple sites without organ or systems involvement: Secondary | ICD-10-CM | POA: Diagnosis not present

## 2022-08-24 NOTE — Telephone Encounter (Signed)
Recent records faxed

## 2022-08-25 ENCOUNTER — Other Ambulatory Visit: Payer: Self-pay

## 2022-08-25 MED ORDER — LIDOCAINE 5 % EX PTCH: 1.0000 | MEDICATED_PATCH | CUTANEOUS | 0 refills | Status: DC

## 2022-08-25 MED ORDER — GABAPENTIN 300 MG PO CAPS: 300.0000 mg | ORAL_CAPSULE | Freq: Every day | ORAL | 0 refills | Status: DC

## 2022-08-25 NOTE — Telephone Encounter (Signed)
Pt husband craig called reporting that the pt was experiencing pain in her back, shoulder blades and chest wall for about 3 weeks now with the pain worsening in the last week. Pt taking MS Contin, dilaudid prn, flexeril, and Voltaren gel without any relief. Pt described pain as "wrapping around my whole upper body". Per craig pt has seen rheumatologist and orthopedist this week and neither have been able to help her with her pain. [name], NP brought into the call and spoke with both patient and husband craig. Per [name], NP, recommendation was to go to the ED for further work up of pain, pt and husband declined to go at this time stating that the pain was "getting better". NP encouraged pt and husband to go but they continued to decline to go. [name] called in gabapentin and lidocaine patch for pt to see if that improves pain, plan for pt/husband to call on Wednesday to update.  Pt and husband verbalize understanding of new medications, of the recommendation to go to the ED, and the recommendation to go to the ED if pain worsens, changes, or pt becomes SOB. Pt and husband have no further questions at the point, they have the office number should they need to call back.

## 2022-08-28 ENCOUNTER — Other Ambulatory Visit (HOSPITAL_COMMUNITY): Payer: Self-pay

## 2022-08-28 ENCOUNTER — Other Ambulatory Visit: Payer: Self-pay | Admitting: Internal Medicine

## 2022-08-28 ENCOUNTER — Telehealth: Payer: Self-pay

## 2022-08-28 ENCOUNTER — Telehealth: Payer: Self-pay | Admitting: Medical Oncology

## 2022-08-28 DIAGNOSIS — I2699 Other pulmonary embolism without acute cor pulmonale: Secondary | ICD-10-CM

## 2022-08-28 MED ORDER — APIXABAN 2.5 MG PO TABS
2.5000 mg | ORAL_TABLET | Freq: Two times a day (BID) | ORAL | 1 refills | Status: DC
  Filled 2022-08-28: qty 60, 30d supply, fill #0

## 2022-08-28 NOTE — Telephone Encounter (Signed)
This RN called and followed up with pt husband regard pt symptoms. Per Dr.Mohamed pt to come in tomorrow 08/29/22 to symptom management, pt husband confirmed appt time and verbalized understanding to go to ED if pt symptoms become worse.

## 2022-08-28 NOTE — Telephone Encounter (Signed)
Pain in L shoulder radiates to  her back and chest wall. It is getting progressively worse. Taking Gabapentin , Lidocaine patch.  She saw [facility], rheumatology. She is now using a walker  has shaking ,jerking episodes in arms and legs. She is only taking MSC ,because prn does not help with pain. Mr. Tompkins  asking if her symptoms are late side effect of Krazati.  There is a similar note from [place] to Dr. [name].

## 2022-08-28 NOTE — Telephone Encounter (Signed)
Patient's husband, craig, called and left a voicemail reporting that over the weekend Mrs.Boring had developed muscle soreness/pain, increased weakness, fatigue, and muscle spasms in her arms. Referring to medical oncology team for next steps.

## 2022-08-29 ENCOUNTER — Inpatient Hospital Stay: Payer: Medicare HMO

## 2022-08-29 ENCOUNTER — Inpatient Hospital Stay: Payer: Medicare HMO | Attending: Nurse Practitioner | Admitting: Physician Assistant

## 2022-08-29 ENCOUNTER — Other Ambulatory Visit: Payer: Self-pay

## 2022-08-29 ENCOUNTER — Encounter: Payer: Self-pay | Admitting: Physician Assistant

## 2022-08-29 VITALS — BP 140/73 | HR 55 | Temp 98.0°F | Resp 16 | Wt 94.7 lb

## 2022-08-29 DIAGNOSIS — R202 Paresthesia of skin: Secondary | ICD-10-CM | POA: Diagnosis not present

## 2022-08-29 DIAGNOSIS — E871 Hypo-osmolality and hyponatremia: Secondary | ICD-10-CM | POA: Diagnosis not present

## 2022-08-29 DIAGNOSIS — C3491 Malignant neoplasm of unspecified part of right bronchus or lung: Secondary | ICD-10-CM | POA: Diagnosis not present

## 2022-08-29 DIAGNOSIS — R5383 Other fatigue: Secondary | ICD-10-CM | POA: Diagnosis not present

## 2022-08-29 DIAGNOSIS — R062 Wheezing: Secondary | ICD-10-CM | POA: Diagnosis not present

## 2022-08-29 DIAGNOSIS — C3411 Malignant neoplasm of upper lobe, right bronchus or lung: Secondary | ICD-10-CM | POA: Insufficient documentation

## 2022-08-29 DIAGNOSIS — R200 Anesthesia of skin: Secondary | ICD-10-CM | POA: Diagnosis not present

## 2022-08-29 DIAGNOSIS — R531 Weakness: Secondary | ICD-10-CM | POA: Diagnosis not present

## 2022-08-29 DIAGNOSIS — G893 Neoplasm related pain (acute) (chronic): Secondary | ICD-10-CM

## 2022-08-29 DIAGNOSIS — E222 Syndrome of inappropriate secretion of antidiuretic hormone: Secondary | ICD-10-CM | POA: Diagnosis not present

## 2022-08-29 DIAGNOSIS — Z515 Encounter for palliative care: Secondary | ICD-10-CM

## 2022-08-29 DIAGNOSIS — M47816 Spondylosis without myelopathy or radiculopathy, lumbar region: Secondary | ICD-10-CM | POA: Diagnosis not present

## 2022-08-29 LAB — CBC WITH DIFFERENTIAL (CANCER CENTER ONLY)
Abs Immature Granulocytes: 0.1 10*3/uL — ABNORMAL HIGH (ref 0.00–0.07)
Basophils Absolute: 0 10*3/uL (ref 0.0–0.1)
Basophils Relative: 0 %
Eosinophils Absolute: 0.1 10*3/uL (ref 0.0–0.5)
Eosinophils Relative: 1 %
HCT: 29.5 % — ABNORMAL LOW (ref 36.0–46.0)
Hemoglobin: 10.1 g/dL — ABNORMAL LOW (ref 12.0–15.0)
Immature Granulocytes: 1 %
Lymphocytes Relative: 12 %
Lymphs Abs: 1 10*3/uL (ref 0.7–4.0)
MCH: 30 pg (ref 26.0–34.0)
MCHC: 34.2 g/dL (ref 30.0–36.0)
MCV: 87.5 fL (ref 80.0–100.0)
Monocytes Absolute: 1.2 10*3/uL — ABNORMAL HIGH (ref 0.1–1.0)
Monocytes Relative: 15 %
Neutro Abs: 5.5 10*3/uL (ref 1.7–7.7)
Neutrophils Relative %: 71 %
Platelet Count: 196 10*3/uL (ref 150–400)
RBC: 3.37 MIL/uL — ABNORMAL LOW (ref 3.87–5.11)
RDW: 13.2 % (ref 11.5–15.5)
WBC Count: 7.9 10*3/uL (ref 4.0–10.5)
nRBC: 0 % (ref 0.0–0.2)

## 2022-08-29 LAB — CMP (CANCER CENTER ONLY)
ALT: 13 U/L (ref 0–44)
AST: 30 U/L (ref 15–41)
Albumin: 3.7 g/dL (ref 3.5–5.0)
Alkaline Phosphatase: 61 U/L (ref 38–126)
Anion gap: 6 (ref 5–15)
BUN: 14 mg/dL (ref 8–23)
CO2: 29 mmol/L (ref 22–32)
Calcium: 8.7 mg/dL — ABNORMAL LOW (ref 8.9–10.3)
Chloride: 88 mmol/L — ABNORMAL LOW (ref 98–111)
Creatinine: 1.08 mg/dL — ABNORMAL HIGH (ref 0.44–1.00)
GFR, Estimated: 54 mL/min — ABNORMAL LOW (ref >60)
Glucose, Bld: 82 mg/dL (ref 70–99)
Potassium: 4.5 mmol/L (ref 3.5–5.1)
Sodium: 123 mmol/L — ABNORMAL LOW (ref 135–145)
Total Bilirubin: 0.5 mg/dL (ref 0.3–1.2)
Total Protein: 6.7 g/dL (ref 6.5–8.1)

## 2022-08-29 LAB — MAGNESIUM: Magnesium: 1.9 mg/dL (ref 1.7–2.4)

## 2022-08-29 MED ORDER — ALBUTEROL SULFATE HFA 108 (90 BASE) MCG/ACT IN AERS: 1.0000 | INHALATION_SPRAY | RESPIRATORY_TRACT | 2 refills | Status: DC | PRN

## 2022-08-29 MED ORDER — SODIUM CHLORIDE 0.9 % IV SOLN: Freq: Once | INTRAVENOUS | Status: AC

## 2022-08-29 NOTE — Patient Instructions (Signed)
Please stop taking the [facility] until you discuss it further with Dr. [name] at your upcoming appointment.  Inhaler prescribed for your wheezing, only if needed. Use as directed.

## 2022-08-29 NOTE — Progress Notes (Signed)
Lab orders placed per [place], [place]

## 2022-08-29 NOTE — Progress Notes (Unsigned)
Symptom Management Consult Note [facility]    Patient Care Team: [name], NP as PCP - General (Family Medicine) [place] as Registered Nurse [facility] and Palliative Medicine) Pickenpack-Cousar, Arty Baumgartner, NP as Nurse Practitioner (Nurse Practitioner)    Name / MRN / DOB: Kristi Jennings  [id number]  08/29/1945   Date of visit: 08/29/2022   Chief Complaint/Reason for visit: fatigue, muscle twitching   Current Therapy: Krazati 600 mg PO BID and Zometa q6 weeks    ASSESSMENT & PLAN: Patient is a 77 y.o. female  with oncologic history of Stage IV (T3, N2, M1 C) non-small cell lung cancer followed by Dr. [name].  I have viewed most recent oncology note and lab work.    #Stage IV (T3, N2, M1 C) non-small cell lung cancer - Next appointment with oncologist is 09/04/22   #Fatigue, muscle twitching - CMP today shows acute hyponatremia 123. Patient's symptoms are suggestive of mild hyponatremia. HPI is not suggestive of seizure like activity. CBC with stable anemia. - Hyponatremia is possible adverse reaction of Krazati.  - Patient given 1L NS in clinic today. - Discussed plan with Dr. [name] who agrees patient will hold Krazati until next appointment. She is already scheduled for routine labs and MD appointment on 09/04/22. - Patient plans to increase dietary salt intake as well in the interim. -Strict ED precautions discussed should symptoms worsen.  #Wheezing -Faint expiratory wheezing heard in lung bases posteriorly. - Patient afebrile, non toxic appearing. Without infectious symptoms. - Engaged in shared decision making regarding work up. Patient does not wish to have chest xray at this time as she "does not feel sick like a pneumonia would cause." She is tired and wants to go home after finishing appointment today. - Patient with normal work of breathing and no hypoxia. Stable for trial of albuterol inhaler for symptoms.  Strict ED  precautions discussed should symptoms worsen.    Heme/Onc History: Oncology History  Non-small cell carcinoma of lung, stage 4, right  11/30/2020 Initial Diagnosis   Non-small cell carcinoma of lung, stage 4, right (HCC)   11/30/2020 Cancer Staging   Staging form: Lung, AJCC 8th Edition - Clinical: Stage IVB (cT3, cN2, cM1c) - Signed by Si Gaul, MD on 11/30/2020   12/29/2020 - 12/20/2021 Chemotherapy   Patient is on Treatment Plan : LUNG CARBOplatin / Pemetrexed / Pembrolizumab q21d Induction x 4 cycles / Maintenance Pemetrexed + Pembrolizumab         Interval history-: Kristi Jennings is a 77 y.o. female with oncologic history as above presenting to [facility] today with chief complaint of fatigue and muscle twitching. She is accompanied by spouse who provides additional history.  Patient reports she has had worsening fatigue with generalized weakness over the last week. She is having to ambulate with a walker because she is worried about falling if her legs give out. She felt the muscle in her arms and legs twitching x 3 days ago and spouse was able to see it as well. It only lasted for a few minutes. She has had a few episodes of that, none today. That is a new problem. She admits her appetite is improving despite not taking Marinol right now. She has been drinking approximately 30 ounces per day for the last several days. Patient also noticed she has intermittent wheezing that started x3 days ago. She denies cough, shortness of breath, feer, chills, congestion. She has had wheezing in the  past that resolved with use of inhaler.    ROS  All other systems are reviewed and are negative for acute change except as noted in the HPI.    Allergies  Allergen Reactions   Shellfish-Derived Products Hives, Itching and Rash    Other reaction(s): Hives    Augmentin [Amoxicillin-Pot Clavulanate] Nausea And Vomiting     Past Medical History:  Diagnosis Date   Borderline diabetes mellitus     Bronchogenic cancer of right lung (HCC)    Dyslipidemia    History of radiation therapy    right chest 12/21/2020-12/24/2020  Dr Antony Blackbird   History of radiation therapy    right lung 06/07/2021-06/17/2021  Dr Antony Blackbird   Hypertension    Polymyalgia rheumatica (HCC)      No past surgical history on file.  Social History   Socioeconomic History   Marital status: Married    Spouse name: Not on file   Number of children: Not on file   Years of education: Not on file   Highest education level: Not on file  Occupational History   Not on file  Tobacco Use   Smoking status: Former    Packs/day: 1.00    Years: 43.00    Additional pack years: 0.00    Total pack years: 43.00    Types: Cigarettes    Quit date: 11/23/2008    Years since quitting: 13.7   Smokeless tobacco: Never   Tobacco comments:    quit 2010  Vaping Use   Vaping Use: Never used  Substance and Sexual Activity   Alcohol use: Not Currently   Drug use: Never   Sexual activity: Not on file  Other Topics Concern   Not on file  Social History Narrative   Not on file   Social Determinants of Health   Financial Resource Strain: Not on file  Food Insecurity: Not on file  Transportation Needs: Not on file  Physical Activity: Not on file  Stress: Not on file  Social Connections: Not on file  Intimate Partner Violence: Not on file    Family History  Problem Relation Age of Onset   Heart attack Mother    Heart disease Mother    Colon cancer Father    ALS Brother      Current Outpatient Medications:    albuterol (VENTOLIN HFA) 108 (90 Base) MCG/ACT inhaler, Inhale 1 puff into the lungs every 4 (four) hours as needed for wheezing or shortness of breath., Disp: 8 g, Rfl: 2   acetaminophen (TYLENOL) 650 MG CR tablet, Take 1,300 mg by mouth 3 (three) times daily as needed for pain., Disp: , Rfl:    adagrasib (KRAZATI) 200 MG tablet, Take 3 tablets (600 mg total) by mouth 2 (two) times daily., Disp: 180 tablet,  Rfl: 0   apixaban (ELIQUIS) 2.5 MG TABS tablet, Take 1 tablet (2.5 mg total) by mouth 2 (two) times daily., Disp: 60 tablet, Rfl: 1   atenolol (TENORMIN) 25 MG tablet, Take 12.5 mg by mouth at bedtime., Disp: , Rfl:    Calcium-Vitamin D-Vitamin K (CALCIUM + D) [phone] MG-UNT-MCG CHEW, , Disp: , Rfl:    Cholecalciferol (VITAMIN D) 50 MCG (2000 UT) tablet, , Disp: , Rfl:    diclofenac Sodium (VOLTAREN) 1 % GEL, Apply 4 g topically 4 (four) times daily. (Patient taking differently: Apply 4 g topically 4 (four) times daily as needed (pain).), Disp: 150 g, Rfl: 0   dronabinol (MARINOL) 10 MG capsule, Take 1  capsule (10 mg total) by mouth daily. (Patient not taking: Reported on 08/08/2022), Disp: 30 capsule, Rfl: 0   escitalopram (LEXAPRO) 20 MG tablet, Take 1 tablet (20 mg total) by mouth at bedtime., Disp: 30 tablet, Rfl: 0   fexofenadine (ALLEGRA) 180 MG tablet, Take 180 mg by mouth daily., Disp: , Rfl:    fluocinonide (LIDEX) 0.05 % external solution, Apply topically daily., Disp: , Rfl:    folic acid (FOLVITE) 1 MG tablet, Take 1 tablet (1 mg total) by mouth daily., Disp: 30 tablet, Rfl: 2   gabapentin (NEURONTIN) 300 MG capsule, Take 1 capsule (300 mg total) by mouth at bedtime., Disp: 15 capsule, Rfl: 0   HYDROmorphone (DILAUDID) 4 MG tablet, Take 4 mg by mouth every 6 (six) hours as needed for severe pain. Quantity of 120 tablets, Disp: , Rfl:    lidocaine (LIDODERM) 5 %, Place 1 patch onto the skin daily. Remove & Discard patch within 12 hours or as directed by MD, Disp: 30 patch, Rfl: 0   mirtazapine (REMERON) 15 MG tablet, Take 15 mg by mouth at bedtime., Disp: , Rfl:    morphine (MS CONTIN) 30 MG 12 hr tablet, Take 1 tablet (30 mg total) by mouth every 12 (twelve) hours., Disp: 60 tablet, Rfl: 0   Multiple Vitamin (MULTI VITAMIN) TABS, , Disp: , Rfl:    ondansetron (ZOFRAN-ODT) 4 MG disintegrating tablet, Take 1 tablet (4 mg total) by mouth 2 (two) times daily as needed for nausea or  vomiting., Disp: 20 tablet, Rfl: 1   OVER THE COUNTER MEDICATION, Take 2 Cans by mouth 2 (two) times daily as needed (for nutrition). Premier protein, Disp: , Rfl:    prochlorperazine (COMPAZINE) 10 MG tablet, Take 1 tablet (10 mg total) by mouth every 6 (six) hours as needed., Disp: 30 tablet, Rfl: 2   Salicylic Acid (SCALPICIN EX), Apply 1 application. topically daily as needed., Disp: , Rfl:    senna-docusate (SENOKOT-S) 8.6-50 MG tablet, Take 1 tablet by mouth 2 (two) times daily., Disp: 30 tablet, Rfl: 2   triamcinolone ointment (KENALOG) 0.1 %, Apply 1 application. topically 2 (two) times daily., Disp: , Rfl:   PHYSICAL EXAM: ECOG FS:1 - Symptomatic but completely ambulatory    Vitals:   08/29/22 1248 08/29/22 1518  BP: (!) 105/57 (!) 140/73  Pulse: 75 (!) 55  Resp: 15 16  Temp: 98 F (36.7 C)   TempSrc: Oral   SpO2: 97% 98%  Weight: 94 lb 11.2 oz (43 kg)    Physical Exam Vitals and nursing note reviewed.  Constitutional:      Appearance: She is not ill-appearing or toxic-appearing.     Comments: Thin female  HENT:     Head: Normocephalic.     Nose: Nose normal. No congestion.  Eyes:     Conjunctiva/sclera: Conjunctivae normal.  Neck:     Vascular: No JVD.  Cardiovascular:     Rate and Rhythm: Normal rate and regular rhythm.     Pulses: Normal pulses.     Heart sounds: Normal heart sounds.  Pulmonary:     Effort: Pulmonary effort is normal. No respiratory distress.     Comments: Faint expiratory wheeze in posterior lung bases. Speaking in full sentences Abdominal:     General: There is no distension.  Musculoskeletal:        General: Normal range of motion.     Cervical back: Normal range of motion.     Right lower leg: No edema.  Left lower leg: No edema.  Skin:    General: Skin is warm and dry.  Neurological:     Mental Status: She is oriented to person, place, and time.     Comments: Speech is clear and goal oriented, follows commands. No facial  droop Normal strength in upper and lower extremities bilaterally including dorsiflexion and plantar flexion, strong and equal grip strength Moves extremities without ataxia, coordination intact Normal gait and balance          LABORATORY DATA: I have reviewed the data as listed    Latest Ref Rng & Units 08/29/2022   12:21 PM 08/08/2022    8:51 AM 07/04/2022   10:43 AM  CBC  WBC 4.0 - 10.5 K/uL 7.9  8.9  10.9   Hemoglobin 12.0 - 15.0 g/dL 54.0  08.6  76.1   Hematocrit 36.0 - 46.0 % 29.5  31.9  34.3   Platelets 150 - 400 K/uL 196  172  223         Latest Ref Rng & Units 08/29/2022   12:21 PM 08/08/2022    8:51 AM 07/04/2022   10:43 AM  CMP  Glucose 70 - 99 mg/dL 82  94  98   BUN 8 - 23 mg/dL 14  23  14    Creatinine 0.44 - 1.00 mg/dL 9.50  9.32  6.71   Sodium 135 - 145 mmol/L 123  137  133   Potassium 3.5 - 5.1 mmol/L 4.5  5.3  5.4   Chloride 98 - 111 mmol/L 88  99  95   CO2 22 - 32 mmol/L 29  32  32   Calcium 8.9 - 10.3 mg/dL 8.7  9.3  9.5   Total Protein 6.5 - 8.1 g/dL 6.7  7.0  7.1   Total Bilirubin 0.3 - 1.2 mg/dL 0.5  0.6  0.6   Alkaline Phos 38 - 126 U/L 61  62  65   AST 15 - 41 U/L 30  36  30   ALT 0 - 44 U/L 13  18  15         RADIOGRAPHIC STUDIES (from last 24 hours if applicable) I have personally reviewed the radiological images as listed and agreed with the findings in the report. No results found.      Visit Diagnosis: 1. Fatigue, unspecified type   2. Non-small cell carcinoma of lung, stage 4, right   3. Wheezing   4. Hyponatremia      No orders of the defined types were placed in this encounter.   All questions were answered. The patient knows to call the clinic with any problems, questions or concerns. No barriers to learning was detected.  I have spent a total of 30 minutes minutes of face-to-face and non-face-to-face time, preparing to see the patient, obtaining and/or reviewing separately obtained history, performing a medically appropriate  examination, counseling and educating the patient, ordering tests, documenting clinical information in the electronic health record, and care coordination (communications with other health care professionals or caregivers).    Thank you for allowing me to participate in the care of this patient.    Shanon Ace, PA-C Department of Hematology/Oncology [facility] Phone: [phone]  [phone]    08/30/2022 9:39 AM

## 2022-08-30 ENCOUNTER — Other Ambulatory Visit: Payer: Self-pay

## 2022-08-30 ENCOUNTER — Inpatient Hospital Stay (HOSPITAL_COMMUNITY)
Admission: EM | Admit: 2022-08-30 | Discharge: 2022-09-12 | DRG: 643 | Disposition: A | Discharge status: Hospice | Payer: Medicare HMO | Attending: Internal Medicine | Admitting: Internal Medicine

## 2022-08-30 ENCOUNTER — Inpatient Hospital Stay (HOSPITAL_COMMUNITY): Payer: Medicare HMO

## 2022-08-30 ENCOUNTER — Emergency Department (HOSPITAL_COMMUNITY): Payer: Medicare HMO

## 2022-08-30 ENCOUNTER — Encounter: Payer: Self-pay | Admitting: Physician Assistant

## 2022-08-30 ENCOUNTER — Telehealth: Payer: Self-pay

## 2022-08-30 ENCOUNTER — Encounter (HOSPITAL_COMMUNITY): Payer: Self-pay

## 2022-08-30 DIAGNOSIS — I6389 Other cerebral infarction: Secondary | ICD-10-CM | POA: Diagnosis present

## 2022-08-30 DIAGNOSIS — R7303 Prediabetes: Secondary | ICD-10-CM | POA: Diagnosis present

## 2022-08-30 DIAGNOSIS — M353 Polymyalgia rheumatica: Secondary | ICD-10-CM | POA: Diagnosis not present

## 2022-08-30 DIAGNOSIS — L89159 Pressure ulcer of sacral region, unspecified stage: Secondary | ICD-10-CM | POA: Diagnosis present

## 2022-08-30 DIAGNOSIS — F32A Depression, unspecified: Secondary | ICD-10-CM | POA: Diagnosis present

## 2022-08-30 DIAGNOSIS — F419 Anxiety disorder, unspecified: Secondary | ICD-10-CM | POA: Diagnosis present

## 2022-08-30 DIAGNOSIS — C7971 Secondary malignant neoplasm of right adrenal gland: Secondary | ICD-10-CM | POA: Diagnosis present

## 2022-08-30 DIAGNOSIS — R200 Anesthesia of skin: Secondary | ICD-10-CM | POA: Diagnosis not present

## 2022-08-30 DIAGNOSIS — W182XXA Fall in (into) shower or empty bathtub, initial encounter: Secondary | ICD-10-CM | POA: Diagnosis present

## 2022-08-30 DIAGNOSIS — M4804 Spinal stenosis, thoracic region: Secondary | ICD-10-CM | POA: Diagnosis present

## 2022-08-30 DIAGNOSIS — R531 Weakness: Secondary | ICD-10-CM | POA: Diagnosis not present

## 2022-08-30 DIAGNOSIS — E222 Syndrome of inappropriate secretion of antidiuretic hormone: Principal | ICD-10-CM | POA: Diagnosis present

## 2022-08-30 DIAGNOSIS — C3490 Malignant neoplasm of unspecified part of unspecified bronchus or lung: Secondary | ICD-10-CM | POA: Diagnosis not present

## 2022-08-30 DIAGNOSIS — E860 Dehydration: Secondary | ICD-10-CM | POA: Diagnosis not present

## 2022-08-30 DIAGNOSIS — M47814 Spondylosis without myelopathy or radiculopathy, thoracic region: Secondary | ICD-10-CM | POA: Diagnosis not present

## 2022-08-30 DIAGNOSIS — M8458XA Pathological fracture in neoplastic disease, other specified site, initial encounter for fracture: Secondary | ICD-10-CM | POA: Diagnosis present

## 2022-08-30 DIAGNOSIS — Z79899 Other long term (current) drug therapy: Secondary | ICD-10-CM

## 2022-08-30 DIAGNOSIS — R29898 Other symptoms and signs involving the musculoskeletal system: Secondary | ICD-10-CM | POA: Diagnosis not present

## 2022-08-30 DIAGNOSIS — I2782 Chronic pulmonary embolism: Secondary | ICD-10-CM | POA: Diagnosis present

## 2022-08-30 DIAGNOSIS — Z66 Do not resuscitate: Secondary | ICD-10-CM | POA: Diagnosis not present

## 2022-08-30 DIAGNOSIS — Y93E1 Activity, personal bathing and showering: Secondary | ICD-10-CM | POA: Diagnosis not present

## 2022-08-30 DIAGNOSIS — G893 Neoplasm related pain (acute) (chronic): Secondary | ICD-10-CM | POA: Diagnosis not present

## 2022-08-30 DIAGNOSIS — I7121 Aneurysm of the ascending aorta, without rupture: Secondary | ICD-10-CM | POA: Diagnosis present

## 2022-08-30 DIAGNOSIS — E785 Hyperlipidemia, unspecified: Secondary | ICD-10-CM | POA: Diagnosis present

## 2022-08-30 DIAGNOSIS — Z681 Body mass index (BMI) 19 or less, adult: Secondary | ICD-10-CM

## 2022-08-30 DIAGNOSIS — C7951 Secondary malignant neoplasm of bone: Secondary | ICD-10-CM | POA: Diagnosis not present

## 2022-08-30 DIAGNOSIS — G319 Degenerative disease of nervous system, unspecified: Secondary | ICD-10-CM | POA: Diagnosis not present

## 2022-08-30 DIAGNOSIS — J90 Pleural effusion, not elsewhere classified: Secondary | ICD-10-CM | POA: Diagnosis not present

## 2022-08-30 DIAGNOSIS — Z91013 Allergy to seafood: Secondary | ICD-10-CM

## 2022-08-30 DIAGNOSIS — E43 Unspecified severe protein-calorie malnutrition: Secondary | ICD-10-CM | POA: Diagnosis not present

## 2022-08-30 DIAGNOSIS — M542 Cervicalgia: Secondary | ICD-10-CM | POA: Diagnosis not present

## 2022-08-30 DIAGNOSIS — I10 Essential (primary) hypertension: Secondary | ICD-10-CM | POA: Diagnosis present

## 2022-08-30 DIAGNOSIS — Z7901 Long term (current) use of anticoagulants: Secondary | ICD-10-CM | POA: Diagnosis not present

## 2022-08-30 DIAGNOSIS — Z7401 Bed confinement status: Secondary | ICD-10-CM

## 2022-08-30 DIAGNOSIS — G8220 Paraplegia, unspecified: Secondary | ICD-10-CM | POA: Diagnosis not present

## 2022-08-30 DIAGNOSIS — R69 Illness, unspecified: Secondary | ICD-10-CM | POA: Diagnosis not present

## 2022-08-30 DIAGNOSIS — Z515 Encounter for palliative care: Secondary | ICD-10-CM

## 2022-08-30 DIAGNOSIS — Z8249 Family history of ischemic heart disease and other diseases of the circulatory system: Secondary | ICD-10-CM

## 2022-08-30 DIAGNOSIS — Z881 Allergy status to other antibiotic agents status: Secondary | ICD-10-CM

## 2022-08-30 DIAGNOSIS — C7931 Secondary malignant neoplasm of brain: Secondary | ICD-10-CM

## 2022-08-30 DIAGNOSIS — Z87891 Personal history of nicotine dependence: Secondary | ICD-10-CM | POA: Diagnosis not present

## 2022-08-30 DIAGNOSIS — R58 Hemorrhage, not elsewhere classified: Secondary | ICD-10-CM | POA: Diagnosis not present

## 2022-08-30 DIAGNOSIS — Z923 Personal history of irradiation: Secondary | ICD-10-CM

## 2022-08-30 DIAGNOSIS — Z510 Encounter for antineoplastic radiation therapy: Secondary | ICD-10-CM | POA: Diagnosis not present

## 2022-08-30 DIAGNOSIS — K5900 Constipation, unspecified: Secondary | ICD-10-CM | POA: Diagnosis present

## 2022-08-30 DIAGNOSIS — M47816 Spondylosis without myelopathy or radiculopathy, lumbar region: Secondary | ICD-10-CM | POA: Diagnosis not present

## 2022-08-30 DIAGNOSIS — Z86718 Personal history of other venous thrombosis and embolism: Secondary | ICD-10-CM

## 2022-08-30 DIAGNOSIS — C3491 Malignant neoplasm of unspecified part of right bronchus or lung: Secondary | ICD-10-CM | POA: Diagnosis not present

## 2022-08-30 DIAGNOSIS — Z7189 Other specified counseling: Secondary | ICD-10-CM | POA: Diagnosis not present

## 2022-08-30 DIAGNOSIS — Z79891 Long term (current) use of opiate analgesic: Secondary | ICD-10-CM

## 2022-08-30 DIAGNOSIS — R202 Paresthesia of skin: Secondary | ICD-10-CM | POA: Diagnosis not present

## 2022-08-30 DIAGNOSIS — I639 Cerebral infarction, unspecified: Secondary | ICD-10-CM | POA: Diagnosis not present

## 2022-08-30 DIAGNOSIS — Z9221 Personal history of antineoplastic chemotherapy: Secondary | ICD-10-CM

## 2022-08-30 DIAGNOSIS — G9520 Unspecified cord compression: Secondary | ICD-10-CM | POA: Diagnosis not present

## 2022-08-30 DIAGNOSIS — Z743 Need for continuous supervision: Secondary | ICD-10-CM | POA: Diagnosis not present

## 2022-08-30 DIAGNOSIS — E871 Hypo-osmolality and hyponatremia: Secondary | ICD-10-CM | POA: Diagnosis present

## 2022-08-30 DIAGNOSIS — T380X5A Adverse effect of glucocorticoids and synthetic analogues, initial encounter: Secondary | ICD-10-CM | POA: Diagnosis not present

## 2022-08-30 DIAGNOSIS — I700 Atherosclerosis of aorta: Secondary | ICD-10-CM | POA: Diagnosis not present

## 2022-08-30 DIAGNOSIS — Z800 Family history of malignant neoplasm of digestive organs: Secondary | ICD-10-CM

## 2022-08-30 LAB — CBC WITH DIFFERENTIAL/PLATELET
Abs Immature Granulocytes: 0.12 10*3/uL — ABNORMAL HIGH (ref 0.00–0.07)
Basophils Absolute: 0 10*3/uL (ref 0.0–0.1)
Basophils Relative: 0 %
Eosinophils Absolute: 0.1 10*3/uL (ref 0.0–0.5)
Eosinophils Relative: 1 %
HCT: 31.4 % — ABNORMAL LOW (ref 36.0–46.0)
Hemoglobin: 10.4 g/dL — ABNORMAL LOW (ref 12.0–15.0)
Immature Granulocytes: 1 %
Lymphocytes Relative: 12 %
Lymphs Abs: 1.1 10*3/uL (ref 0.7–4.0)
MCH: 29.3 pg (ref 26.0–34.0)
MCHC: 33.1 g/dL (ref 30.0–36.0)
MCV: 88.5 fL (ref 80.0–100.0)
Monocytes Absolute: 1.4 10*3/uL — ABNORMAL HIGH (ref 0.1–1.0)
Monocytes Relative: 16 %
Neutro Abs: 6.2 10*3/uL (ref 1.7–7.7)
Neutrophils Relative %: 70 %
Platelets: 185 10*3/uL (ref 150–400)
RBC: 3.55 MIL/uL — ABNORMAL LOW (ref 3.87–5.11)
RDW: 13.2 % (ref 11.5–15.5)
WBC: 8.9 10*3/uL (ref 4.0–10.5)
nRBC: 0 % (ref 0.0–0.2)

## 2022-08-30 LAB — COMPREHENSIVE METABOLIC PANEL
ALT: 16 U/L (ref 0–44)
AST: 31 U/L (ref 15–41)
Albumin: 3.3 g/dL — ABNORMAL LOW (ref 3.5–5.0)
Alkaline Phosphatase: 56 U/L (ref 38–126)
Anion gap: 7 (ref 5–15)
BUN: 11 mg/dL (ref 8–23)
CO2: 27 mmol/L (ref 22–32)
Calcium: 8.4 mg/dL — ABNORMAL LOW (ref 8.9–10.3)
Chloride: 92 mmol/L — ABNORMAL LOW (ref 98–111)
Creatinine, Ser: 0.89 mg/dL (ref 0.44–1.00)
GFR, Estimated: >60 mL/min (ref >60)
Glucose, Bld: 100 mg/dL — ABNORMAL HIGH (ref 70–99)
Potassium: 4.3 mmol/L (ref 3.5–5.1)
Sodium: 126 mmol/L — ABNORMAL LOW (ref 135–145)
Total Bilirubin: 0.7 mg/dL (ref 0.3–1.2)
Total Protein: 6.4 g/dL — ABNORMAL LOW (ref 6.5–8.1)

## 2022-08-30 LAB — SODIUM: Sodium: 126 mmol/L — ABNORMAL LOW (ref 135–145)

## 2022-08-30 LAB — LIPID PANEL
Cholesterol: 154 mg/dL (ref 0–200)
HDL: 48 mg/dL (ref >40)
LDL Cholesterol: 80 mg/dL (ref 0–99)
Total CHOL/HDL Ratio: 3.2 RATIO
Triglycerides: 132 mg/dL (ref <150)
VLDL: 26 mg/dL (ref 0–40)

## 2022-08-30 LAB — OSMOLALITY: Osmolality: 260 mOsm/kg — ABNORMAL LOW (ref 275–295)

## 2022-08-30 LAB — GLUCOSE, CAPILLARY: Glucose-Capillary: 92 mg/dL (ref 70–99)

## 2022-08-30 LAB — TROPONIN I (HIGH SENSITIVITY)
Troponin I (High Sensitivity): 16 ng/L (ref <18)
Troponin I (High Sensitivity): 14 ng/L (ref <18)

## 2022-08-30 LAB — PROTIME-INR
INR: 1.3 — ABNORMAL HIGH (ref 0.8–1.2)
Prothrombin Time: 15.9 seconds — ABNORMAL HIGH (ref 11.4–15.2)

## 2022-08-30 LAB — HEMOGLOBIN A1C
Hgb A1c MFr Bld: 5.2 % (ref 4.8–5.6)
Mean Plasma Glucose: 102.54 mg/dL

## 2022-08-30 LAB — TSH: TSH: 2.644 u[IU]/mL (ref 0.350–4.500)

## 2022-08-30 MED ORDER — SODIUM CHLORIDE 0.9 % IV SOLN
INTRAVENOUS | Status: DC
  Administered 2022-08-30: 75 mL/h via INTRAVENOUS

## 2022-08-30 MED ORDER — ONDANSETRON HCL 4 MG/2ML IJ SOLN: 4.0000 mg | Freq: Four times a day (QID) | INTRAMUSCULAR | Status: DC | PRN

## 2022-08-30 MED ORDER — LORAZEPAM 2 MG/ML IJ SOLN
1.0000 mg | Freq: Once | INTRAMUSCULAR | Status: AC
  Administered 2022-08-30: 1 mg via INTRAVENOUS
  Filled 2022-08-30: qty 1

## 2022-08-30 MED ORDER — ONDANSETRON HCL 4 MG PO TABS
4.0000 mg | ORAL_TABLET | Freq: Four times a day (QID) | ORAL | Status: DC | PRN
  Filled 2022-08-30: qty 1

## 2022-08-30 MED ORDER — ACETAMINOPHEN 325 MG PO TABS
650.0000 mg | ORAL_TABLET | Freq: Four times a day (QID) | ORAL | Status: DC | PRN
  Administered 2022-08-31: 650 mg via ORAL
  Filled 2022-08-30: qty 2

## 2022-08-30 MED ORDER — ALBUTEROL SULFATE (2.5 MG/3ML) 0.083% IN NEBU: 2.5000 mg | INHALATION_SOLUTION | RESPIRATORY_TRACT | Status: DC | PRN

## 2022-08-30 MED ORDER — ACETAMINOPHEN 650 MG RE SUPP: 650.0000 mg | Freq: Four times a day (QID) | RECTAL | Status: DC | PRN

## 2022-08-30 NOTE — ED Notes (Signed)
Pt to ct via stretcher

## 2022-08-30 NOTE — Progress Notes (Signed)
Attempted MRI scans. Despite  reassurances from both ED nurses, patient was not A&O and could not transfer to a wheelchair. Patient arrived in MRI crying, unable to answer simple questions, & unable to lie flat. Ordering physician & RNs were notified & patient was sent back to her room.

## 2022-08-30 NOTE — H&P (Signed)
History and Physical    Kristi Jennings:[id number] DOB: 06-28-1945 DOA: 08/30/2022  PCP: Kristi Dana, NP  Patient coming from: home  I have personally briefly reviewed patient's old medical records in [facility]  Chief Complaint: b/l lower ext weakness/ generalized weakness x few days with fall   HPI: Kristi Jennings is a 77 y.o. female with medical history significant of  pre diabetes, HLD, Hypertension, Polymyalgia rheumatica , Pulmonary emboli on OAC,  Ascending aortic aneurysm,DJD of the spine, Stage IV (T3, N2, M1 C) non-small cell lung cancer with bone and right adrenal metastasis followed by Dr. [name] s/p radiation , palliative chemo s/p 18 cycles . Of note patient last chemo cycle was 12/2020, treatment stopped due to disease progression. Patient currently on oral chemo  with [place].  Patient has had increasing weakness  and fatigue at home x 1 week with associated muscle twitching x 3-4 and due to this was seen in follow up by oncology on 08/29/22. At that visit patient was noted to have NA of 123 which  was thought to be due to her oral chemo therapy. And dehydration. Patient was given ivf 1L and Caryn Section was placed on hold. However , today at home her symptoms worsened and patient had a fall. Due to progression of symptoms patient was referred to ED.  Patient currently noted no fever/chills/ n/v/d. She notes continued fatigue and weakness.  ED Course:  Afeb, bp 105/57, hr 75, rr 15 , sat 97%  Labs:  Wbc 8.9 hgb: 10.4( at baseline),  plt 185  , K 4.3, cl 92, glu 100, cr 0.89 Na 126 ( up from 123 yesterday s/p ivfs)  K 4.3,   Cxr: IMPRESSION: Persistent mass like thickening at the right lung apex is unchanged compared to 07/04/22 CT chest/abdomen/pelvis. No new focal airspace opacity.   EKG: NSR,   Xray lumbar spine IMPRESSION: 1. No acute findings. 2. Moderate spondylosis of the lumbar spine with moderate multilevel disc disease. 3. Known severe compression  fractures of T8 and T12 thought to be pathologic due to known underlying metastatic lung cancer   CTH MPRESSION: 1. Atrophy and chronic small-vessel ischemic change of the white matter. No sign of acute infarction. 2. Axial image 20 suggests a possible 3-4 mm slightly hyperdense focus in the subcortical white matter of the right frontal lobe. Nothing was seen in this location on the previous MRI. This could be interval development of a small metastasis. Consider repeat MRI. 3. 5 mm aneurysm shown by MRI cannot be specifically seen by noncontrast CT.  Tx ns 500cc  Review of Systems: As per HPI otherwise 10 point review of systems negative.   Past Medical History:  Diagnosis Date   Borderline diabetes mellitus    Bronchogenic cancer of right lung    Dyslipidemia    History of radiation therapy    right chest 12/21/2020-12/24/2020  Dr Antony Blackbird   History of radiation therapy    right lung 06/07/2021-06/17/2021  Dr Antony Blackbird   Hypertension    Polymyalgia rheumatica     History reviewed. No pertinent surgical history.   reports that she quit smoking about 13 years ago. Her smoking use included cigarettes. She has a 43.00 pack-year smoking history. She has never used smokeless tobacco. She reports that she does not currently use alcohol. She reports that she does not use drugs.  Allergies  Allergen Reactions   Shellfish-Derived Products Hives, Itching and Rash    Other reaction(s):  Hives    Augmentin [Amoxicillin-Pot Clavulanate] Nausea And Vomiting    Family History  Problem Relation Age of Onset   Heart attack Mother    Heart disease Mother    Colon cancer Father    ALS Brother     Prior to Admission medications   Medication Sig Start Date End Date Taking? Authorizing Provider  acetaminophen (TYLENOL) 650 MG CR tablet Take 1,300 mg by mouth 3 (three) times daily as needed for pain.    [provider]  adagrasib (KRAZATI) 200 MG tablet Take 3 tablets (600 mg  total) by mouth 2 (two) times daily. 08/04/22   Si Gaul, MD  albuterol (VENTOLIN HFA) 108 (90 Base) MCG/ACT inhaler Inhale 1 puff into the lungs every 4 (four) hours as needed for wheezing or shortness of breath. 08/29/22   Walisiewicz, Caroleen Hamman, PA-C  apixaban (ELIQUIS) 2.5 MG TABS tablet Take 1 tablet (2.5 mg total) by mouth 2 (two) times daily. 08/28/22   Si Gaul, MD  atenolol (TENORMIN) 25 MG tablet Take 12.5 mg by mouth at bedtime. 07/22/20   [provider]  Calcium-Vitamin D-Vitamin K (CALCIUM + D) [phone] MG-UNT-MCG CHEW     [provider]  Cholecalciferol (VITAMIN D) 50 MCG (2000 UT) tablet     [provider]  diclofenac Sodium (VOLTAREN) 1 % GEL Apply 4 g topically 4 (four) times daily. Patient taking differently: Apply 4 g topically 4 (four) times daily as needed (pain). 12/26/20   Rolan Bucco, MD  dronabinol (MARINOL) 10 MG capsule Take 1 capsule (10 mg total) by mouth daily. Patient not taking: Reported on 08/08/2022 07/04/22   Pickenpack-Cousar, Arty Baumgartner, NP  escitalopram (LEXAPRO) 20 MG tablet Take 1 tablet (20 mg total) by mouth at bedtime. 08/07/22   Kristi Dana, NP  fexofenadine (ALLEGRA) 180 MG tablet Take 180 mg by mouth daily.    [provider]  fluocinonide (LIDEX) 0.05 % external solution Apply topically daily. 09/08/21   [provider]  folic acid (FOLVITE) 1 MG tablet Take 1 tablet (1 mg total) by mouth daily. 07/10/22   Pickenpack-Cousar, Arty Baumgartner, NP  gabapentin (NEURONTIN) 300 MG capsule Take 1 capsule (300 mg total) by mouth at bedtime. 08/25/22   Pickenpack-Cousar, Arty Baumgartner, NP  HYDROmorphone (DILAUDID) 4 MG tablet Take 4 mg by mouth every 6 (six) hours as needed for severe pain. Quantity of 120 tablets 05/12/21   Pickenpack-Cousar, Athena N, NP  lidocaine (LIDODERM) 5 % Place 1 patch onto the skin daily. Remove & Discard patch within 12 hours or as directed by MD 08/25/22   Pickenpack-Cousar, Arty Baumgartner, NP   mirtazapine (REMERON) 15 MG tablet Take 15 mg by mouth at bedtime.    [provider]  morphine (MS CONTIN) 30 MG 12 hr tablet Take 1 tablet (30 mg total) by mouth every 12 (twelve) hours. 08/26/22   Pickenpack-Cousar, Arty Baumgartner, NP  Multiple Vitamin (MULTI VITAMIN) TABS     [provider]  ondansetron (ZOFRAN-ODT) 4 MG disintegrating tablet Take 1 tablet (4 mg total) by mouth 2 (two) times daily as needed for nausea or vomiting. 11/15/21   Pickenpack-Cousar, Arty Baumgartner, NP  OVER THE COUNTER MEDICATION Take 2 Cans by mouth 2 (two) times daily as needed (for nutrition). Premier protein    [provider]  prochlorperazine (COMPAZINE) 10 MG tablet Take 1 tablet (10 mg total) by mouth every 6 (six) hours as needed. 07/06/22   Heilingoetter, Cassandra L, PA-C  Salicylic  Acid (SCALPICIN EX) Apply 1 application. topically daily as needed.    [provider]  senna-docusate (SENOKOT-S) 8.6-50 MG tablet Take 1 tablet by mouth 2 (two) times daily. 09/28/21   Heilingoetter, Cassandra L, PA-C  triamcinolone ointment (KENALOG) 0.1 % Apply 1 application. topically 2 (two) times daily. 07/20/21   [provider]    Physical Exam: Vitals:   08/30/22 1415 08/30/22 1445 08/30/22 1500 08/30/22 1530  BP: 128/66 (!) 147/64 126/65 137/64  Pulse: (!) 54 (!) 55 (!) 53 (!) 54  Resp: Temp:      TempSrc:      SpO2: 95% 100% 96% 100%  Weight:      Height:        Constitutional: NAD, calm, comfortable Vitals:   08/30/22 1415 08/30/22 1445 08/30/22 1500 08/30/22 1530  BP: 128/66 (!) 147/64 126/65 137/64  Pulse: (!) 54 (!) 55 (!) 53 (!) 54  Resp: Temp:      TempSrc:      SpO2: 95% 100% 96% 100%  Weight:      Height:       Eyes: PERRL, lids and conjunctivae normal ENMT: Mucous membranes are dry. Posterior pharynx clear of any exudate or lesions.Normal dentition.  Neck: normal, supple, no masses, no thyromegaly Respiratory: clear to auscultation  bilaterally, no wheezing, no crackles. Normal respiratory effort. No accessory muscle use.  Cardiovascular: Regular rate and rhythm, no murmurs / rubs / gallops. No extremity edema. 2+ pedal pulses.  Abdomen: no tenderness, no masses palpated. No hepatosplenomegaly. Bowel sounds positive.  Musculoskeletal: no clubbing / cyanosis. No joint deformity upper and lower extremities. Good ROM, no contractures. Normal muscle tone.  Skin: no rashes, lesions, ulcers. No induration Neurologic: CN 2-12 grossly intact. Sensation intact,  left lower dense paresis,  right lower  3/5 , UE b/l 4+/5 Psychiatric: Normal judgment and insight. Alert and oriented x 3. Normal mood.    Labs on Admission: I have personally reviewed following labs and imaging studies  CBC: Recent Labs  Lab 08/29/22 1221 08/30/22 1412  WBC 7.9 8.9  NEUTROABS 5.5 6.2  HGB 10.1* 10.4*  HCT 29.5* 31.4*  MCV 87.5 88.5  PLT 196 185   Basic Metabolic Panel: Recent Labs  Lab 08/29/22 1221 08/30/22 1412  NA 123* 126*  K 4.5 4.3  CL 88* 92*  CO2 29 27  GLUCOSE 82 100*  BUN 14 11  CREATININE 1.08* 0.89  CALCIUM 8.7* 8.4*  MG 1.9  --    GFR: Estimated Creatinine Clearance: 36.7 mL/min (by C-G formula based on SCr of 0.89 mg/dL). Liver Function Tests: Recent Labs  Lab 08/29/22 1221 08/30/22 1412  AST 30 31  ALT 13 16  ALKPHOS 61 56  BILITOT 0.5 0.7  PROT 6.7 6.4*  ALBUMIN 3.7 3.3*   No results for input(s): "LIPASE", "AMYLASE" in the last 168 hours. No results for input(s): "AMMONIA" in the last 168 hours. Coagulation Profile: Recent Labs  Lab 08/30/22 1446  INR 1.3*   Cardiac Enzymes: No results for input(s): "CKTOTAL", "CKMB", "CKMBINDEX", "TROPONINI" in the last 168 hours. BNP (last 3 results) No results for input(s): "PROBNP" in the last 8760 hours. HbA1C: No results for input(s): "HGBA1C" in the last 72 hours. CBG: No results for input(s): "GLUCAP" in the last 168 hours. Lipid Profile: No results  for input(s): "CHOL", "HDL", "LDLCALC", "TRIG", "CHOLHDL", "LDLDIRECT" in the last 72 hours. Thyroid Function Tests: No results for input(s): "  TSH", "T4TOTAL", "FREET4", "T3FREE", "THYROIDAB" in the last 72 hours. Anemia Panel: No results for input(s): "VITAMINB12", "FOLATE", "FERRITIN", "TIBC", "IRON", "RETICCTPCT" in the last 72 hours. Urine analysis: No results found for: "COLORURINE", "APPEARANCEUR", "LABSPEC", "PHURINE", "GLUCOSEU", "HGBUR", "BILIRUBINUR", "KETONESUR", "PROTEINUR", "UROBILINOGEN", "NITRITE", "LEUKOCYTESUR"  Radiological Exams on Admission: CT HEAD WO CONTRAST (5MM)  Result Date: 08/30/2022 CLINICAL DATA:  Sudden numbness and tingling in the legs and feet. 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 Metastatic lung cancer. EXAM: CT HEAD WITHOUT CONTRAST TECHNIQUE: Contiguous axial images were obtained from the base of the skull through the vertex without intravenous contrast. RADIATION DOSE REDUCTION: This exam was performed according to the departmental dose-optimization program which includes automated exposure control, adjustment of the mA and/or kV according to patient size and/or use of iterative reconstruction technique. COMPARISON:  MRI 06/28/2022 FINDINGS: Brain: No focal abnormality is seen affecting the brainstem or cerebellum. Cerebral hemispheres show atrophy and chronic small-vessel ischemic change throughout the white matter. No sign of acute infarction. Axial image 20 suggests a possible 3-4 mm slightly hyperdense focus in the subcortical white matter of the right frontal lobe. Nothing was seen in this location on the previous MRI. This could be interval development of a metastasis. Consider repeat MRI. No evidence of hemorrhage, hydrocephalus or extra-axial collection. Vascular: There is atherosclerotic calcification of the major vessels at the base of the brain. 5 mm aneurysm shown by MRI cannot be specifically seen by noncontrast CT. Skull: Normal Sinuses/Orbits: Clear/normal  Other: None IMPRESSION: 1. Atrophy and chronic small-vessel ischemic change of the white matter. No sign of acute infarction. 2. Axial image 20 suggests a possible 3-4 mm slightly hyperdense focus in the subcortical white matter of the right frontal lobe. Nothing was seen in this location on the previous MRI. This could be interval development of a small metastasis. Consider repeat MRI. 3. 5 mm aneurysm shown by MRI cannot be specifically seen by noncontrast CT. Electronically Signed   By: Paulina FusiMark  Shogry M.D.   On: 08/30/2022 16:04   DG Lumbar Spine Complete  Result Date: 08/30/2022 CLINICAL DATA:  Bilateral lower extremity weakness several days. Increasing weakness/dehydration recently. Stage IV lung cancer. EXAM: LUMBAR SPINE - COMPLETE 4+ VIEW COMPARISON:  CT 07/04/2022 FINDINGS: Vertebral body alignment is normal. Evidence of patient's known severe compression fractures of T8 and T12 thought to be pathologic due to underlying metastatic disease from known lung cancer. No new compression fractures. Moderate spondylosis of the lumbar spine to include moderate facet arthropathy over the lower lumbar spine. Moderate multilevel disc disease over the lumbar spine most prominent at the L2-3 and L4-5 levels. Mild degenerative change of the hips. Remainder of the exam is unchanged. IMPRESSION: 1. No acute findings. 2. Moderate spondylosis of the lumbar spine with moderate multilevel disc disease. 3. Known severe compression fractures of T8 and T12 thought to be pathologic due to known underlying metastatic lung cancer Electronically Signed   By: Elberta Fortisaniel  Boyle M.D.   On: 08/30/2022 14:51   DG Chest 2 View  Result Date: 08/30/2022 CLINICAL DATA:  Weakness EXAM: CHEST - 2 VIEW COMPARISON:  CXR 01/05/21 FINDINGS: No pleural effusion. No pneumothorax. Increased asymmetric elevation of the right hemidiaphragm compared to 01/05/2021 likely secondary to interval volume loss. Unchanged appearance of the right lung apex  compared to recent CT chest dated 07/04/2022. Normal cardiac and mediastinal contours. No radiographically apparent displaced rib fractures. Visualized upper abdomen is unremarkable. Vertebral body heights are unchanged compared to recent prior CT chest abdomen and pelvis dated  07/04/2022 with compression deformities in the mid and lower thoracic spine IMPRESSION: Persistent mass like thickening at the right lung apex is unchanged compared to 07/04/22 CT chest/abdomen/pelvis. No new focal airspace opacity. Electronically Signed   By: Lorenza Cambridge M.D.   On: 08/30/2022 14:37    EKG: Independently reviewed. Sinus brady    Assessment/Plan  Symptomatic hyponatremia  Fatigue/muscle twitching/falls -multifactorial due to medication as well as poor intake  - of note na improved s/p 1L iv given in clinic yesterday to 126 from 123  - to be complete will check serum osmo, tsh, cortisol in light of mets, urine na  -gently hydration , and monitor serial ns  -neuro checks   [place] [facility] suggestive of   Brain mets  Left lower extremity paresis Right lower extremity weakness Hx of mets to spine  - possible cause of lower extremity weakness  -MRI /head/ spine pending  - continue on neuro checks   Stage IV  non-small cell  lung ca  -holding Krazati  -concern for continued progression  -await heme final recs    Pulmonary embolism  -continue on apixiban   Ascending Aortic Aneurysm  -no acute issues    Pre diabetes -place on poc  -check A1c     HLD - diet controlled    Hypertension -resume metoprolol     Polymyalgia rheumatica  -check esr   Cancer pain  -resume home regimen once med rec completed      DVT prophylaxis: on apixiban  Code Status: default full as patient not able to make decision at this time Family Communication: none at bedside  Disposition Plan: patient  expected to be admitted greater than 2 midnights  Consults called: Dr [name], please consult  in am   Admission status: progressive    Lurline Del MD [facility]   If 7PM-7AM, please contact night-coverage [email or web address] Password [facility]  08/30/2022, 4:48 PM

## 2022-08-30 NOTE — Telephone Encounter (Signed)
Pt husband craig called reporting that over night pt had new sudden numbness and tingling in her legs and feet as well as a fall. Spoke with [place], NP and [place], [place] and both recommended pt go to ED for further follow up. Pt husband and pt verbalized understanding and intent to go to ED no further needs at this point, all questions answered.

## 2022-08-30 NOTE — ED Notes (Signed)
ED TO INPATIENT HANDOFF REPORT  Name/Age/Gender Kristi Jennings 77 y.o. female  Code Status Code Status History     Date Active Date Inactive Code Status Order ID Comments User Context   01/04/2021 2247 01/06/2021 2252 DNR [id number]  Edsel Petrin, DO Inpatient   01/01/2021 2024 01/04/2021 2247 Full Code [id number]  Rometta Emery, MD Inpatient    Questions for Most Recent Historical Code Status (Order [id number])     Question Answer   In the event of cardiac or respiratory ARREST Do not call a "code blue"   In the event of cardiac or respiratory ARREST Do not perform Intubation, CPR, defibrillation or ACLS   In the event of cardiac or respiratory ARREST Use medication by any route, position, wound care, and other measures to relive pain and suffering. May use oxygen, suction and manual treatment of airway obstruction as needed for comfort.            Home/SNF/Other Home  Chief Complaint Hyponatremia [E87.1]  Level of Care/Admitting Diagnosis ED Disposition     ED Disposition  Admit   Condition  --   Comment  Hospital Area: [facility] [[id number]]  Level of Care: Progressive [102]  Admit to Progressive based on following criteria: NEUROLOGICAL AND NEUROSURGICAL complex patients with significant risk of instability, who do not meet ICU criteria, yet require close observation or frequent assessment (< / = every 2 - 4 hours) with medical / nursing intervention.  Admit to Progressive based on following criteria: GI, ENDOCRINE disease patients with GI bleeding, acute liver failure or pancreatitis, stable with diabetic ketoacidosis or thyrotoxicosis (hypothyroid) state.  May admit patient to [facility] or [facility] if equivalent level of care is available:: No  Covid Evaluation: Asymptomatic - no recent exposure (last 10 days) testing not required  Diagnosis: Hyponatremia [915056]  Admitting Physician: Lurline Del [[id number]]  Attending Physician:  Lurline Del [[id number]]  Certification:: I certify this patient will need inpatient services for at least 2 midnights  Estimated Length of Stay: 3          Medical History Past Medical History:  Diagnosis Date   Borderline diabetes mellitus    Bronchogenic cancer of right lung    Dyslipidemia    History of radiation therapy    right chest 12/21/2020-12/24/2020  Dr Antony Blackbird   History of radiation therapy    right lung 06/07/2021-06/17/2021  Dr Antony Blackbird   Hypertension    Polymyalgia rheumatica     Allergies Allergies  Allergen Reactions   Shellfish-Derived Products Hives, Itching and Rash    Other reaction(s): Hives    Augmentin [Amoxicillin-Pot Clavulanate] Nausea And Vomiting    IV Location/Drains/Wounds Patient Lines/Drains/Airways Status     Active Line/Drains/Airways     Name Placement date Placement time Site Days   Peripheral IV 08/30/22 20 G Anterior;Left;Proximal Forearm 08/30/22  1411  Forearm  less than 1            Labs/Imaging Results for orders placed or performed during the hospital encounter of 08/30/22 (from the past 48 hour(s))  CBC with Differential     Status: Abnormal   Collection Time: 08/30/22  2:12 PM  Result Value Ref Range   WBC 8.9 4.0 - 10.5 K/uL   RBC 3.55 (L) 3.87 - 5.11 MIL/uL   Hemoglobin 10.4 (L) 12.0 - 15.0 g/dL   HCT 27.0 (L) 78.6 - 75.4 %   MCV 88.5 80.0 - 100.0 fL  MCH 29.3 26.0 - 34.0 pg   MCHC 33.1 30.0 - 36.0 g/dL   RDW 77.8 24.2 - 35.3 %   Platelets 185 150 - 400 K/uL   nRBC 0.0 0.0 - 0.2 %   Neutrophils Relative % 70 %   Neutro Abs 6.2 1.7 - 7.7 K/uL   Lymphocytes Relative 12 %   Lymphs Abs 1.1 0.7 - 4.0 K/uL   Monocytes Relative 16 %   Monocytes Absolute 1.4 (H) 0.1 - 1.0 K/uL   Eosinophils Relative 1 %   Eosinophils Absolute 0.1 0.0 - 0.5 K/uL   Basophils Relative 0 %   Basophils Absolute 0.0 0.0 - 0.1 K/uL   Immature Granulocytes 1 %   Abs Immature Granulocytes 0.12 (H) 0.00 - 0.07 K/uL     Comment: Performed at [facility], [address], [place], [place] [address]  Comprehensive metabolic panel     Status: Abnormal   Collection Time: 08/30/22  2:12 PM  Result Value Ref Range   Sodium 126 (L) 135 - 145 mmol/L   Potassium 4.3 3.5 - 5.1 mmol/L   Chloride 92 (L) 98 - 111 mmol/L   CO2 27 22 - 32 mmol/L   Glucose, Bld 100 (H) 70 - 99 mg/dL    Comment: Glucose reference range applies only to samples taken after fasting for at least 8 hours.   BUN 11 8 - 23 mg/dL   Creatinine, Ser 1.54 0.44 - 1.00 mg/dL   Calcium 8.4 (L) 8.9 - 10.3 mg/dL   Total Protein 6.4 (L) 6.5 - 8.1 g/dL   Albumin 3.3 (L) 3.5 - 5.0 g/dL   AST 31 15 - 41 U/L   ALT 16 0 - 44 U/L   Alkaline Phosphatase 56 38 - 126 U/L   Total Bilirubin 0.7 0.3 - 1.2 mg/dL   GFR, Estimated >00 >86 mL/min    Comment: (NOTE) Calculated using the CKD-EPI Creatinine Equation (2021)    Anion gap 7 5 - 15    Comment: Performed at [facility], [address], [place], [place] [address]  Troponin I (High Sensitivity)     Status: None   Collection Time: 08/30/22  2:46 PM  Result Value Ref Range   Troponin I (High Sensitivity) 16 <18 ng/L    Comment: (NOTE) Elevated high sensitivity troponin I (hsTnI) values and significant  changes across serial measurements may suggest ACS but many other  chronic and acute conditions are known to elevate hsTnI results.  Refer to the "Links" section for chest pain algorithms and additional  guidance. Performed at [facility], [address] [place], [place] [address]   Protime-INR     Status: Abnormal   Collection Time: 08/30/22  2:46 PM  Result Value Ref Range   Prothrombin Time 15.9 (H) 11.4 - 15.2 seconds   INR 1.3 (H) 0.8 - 1.2    Comment: (NOTE) INR goal varies based on device and disease states. Performed at [facility], [address], [place], [place] [address]    CT HEAD WO CONTRAST  ( )  Result Date: 08/30/2022 CLINICAL DATA:  Sudden numbness and tingling in the legs and feet. 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 Metastatic lung cancer. EXAM: CT HEAD WITHOUT CONTRAST TECHNIQUE: Contiguous axial images were obtained from the base of the skull through the vertex without intravenous contrast. RADIATION DOSE REDUCTION: This exam was performed according to the departmental dose-optimization program which includes automated exposure control, adjustment of the mA  and/or kV according to patient size and/or use of iterative reconstruction technique. COMPARISON:  MRI 06/28/2022 FINDINGS: Brain: No focal abnormality is seen affecting the brainstem or cerebellum. Cerebral hemispheres show atrophy and chronic small-vessel ischemic change throughout the white matter. No sign of acute infarction. Axial image 20 suggests a possible 3-4 mm slightly hyperdense focus in the subcortical white matter of the right frontal lobe. Nothing was seen in this location on the previous MRI. This could be interval development of a metastasis. Consider repeat MRI. No evidence of hemorrhage, hydrocephalus or extra-axial collection. Vascular: There is atherosclerotic calcification of the major vessels at the base of the brain. 5 mm aneurysm shown by MRI cannot be specifically seen by noncontrast CT. Skull: Normal Sinuses/Orbits: Clear/normal Other: None IMPRESSION: 1. Atrophy and chronic small-vessel ischemic change of the white matter. No sign of acute infarction. 2. Axial image 20 suggests a possible 3-4 mm slightly hyperdense focus in the subcortical white matter of the right frontal lobe. Nothing was seen in this location on the previous MRI. This could be interval development of a small metastasis. Consider repeat MRI. 3. 5 mm aneurysm shown by MRI cannot be specifically seen by noncontrast CT. Electronically Signed   By: Paulina Fusi M.D.   On: 08/30/2022 16:04   DG Lumbar Spine Complete  Result Date:  08/30/2022 CLINICAL DATA:  Bilateral lower extremity weakness several days. Increasing weakness/dehydration recently. Stage IV lung cancer. EXAM: LUMBAR SPINE - COMPLETE 4+ VIEW COMPARISON:  CT 07/04/2022 FINDINGS: Vertebral body alignment is normal. Evidence of patient's known severe compression fractures of T8 and T12 thought to be pathologic due to underlying metastatic disease from known lung cancer. No new compression fractures. Moderate spondylosis of the lumbar spine to include moderate facet arthropathy over the lower lumbar spine. Moderate multilevel disc disease over the lumbar spine most prominent at the L2-3 and L4-5 levels. Mild degenerative change of the hips. Remainder of the exam is unchanged. IMPRESSION: 1. No acute findings. 2. Moderate spondylosis of the lumbar spine with moderate multilevel disc disease. 3. Known severe compression fractures of T8 and T12 thought to be pathologic due to known underlying metastatic lung cancer Electronically Signed   By: Elberta Fortis M.D.   On: 08/30/2022 14:51   DG Chest 2 View  Result Date: 08/30/2022 CLINICAL DATA:  Weakness EXAM: CHEST - 2 VIEW COMPARISON:  CXR 01/05/21 FINDINGS: No pleural effusion. No pneumothorax. Increased asymmetric elevation of the right hemidiaphragm compared to 01/05/2021 likely secondary to interval volume loss. Unchanged appearance of the right lung apex compared to recent CT chest dated 07/04/2022. Normal cardiac and mediastinal contours. No radiographically apparent displaced rib fractures. Visualized upper abdomen is unremarkable. Vertebral body heights are unchanged compared to recent prior CT chest abdomen and pelvis dated 07/04/2022 with compression deformities in the mid and lower thoracic spine IMPRESSION: Persistent mass like thickening at the right lung apex is unchanged compared to 07/04/22 CT chest/abdomen/pelvis. No new focal airspace opacity. Electronically Signed   By: Lorenza Cambridge M.D.   On: 08/30/2022 14:37     Pending Labs Unresulted Labs (From admission, onward)    None       Vitals/Pain Today's Vitals   08/30/22 1415 08/30/22 1445 08/30/22 1500 08/30/22 1530  BP: 128/66 (!) 147/64 126/65 137/64  Pulse: (!) 54 (!) 55 (!) 53 (!) 54  Resp: Temp:      TempSrc:      SpO2: 95% 100% 96% 100%  Weight:  Height:      PainSc:        Isolation Precautions No active isolations  Medications Medications - No data to display  Mobility walks with device

## 2022-08-30 NOTE — ED Provider Notes (Signed)
[place] EMERGENCY DEPARTMENT AT [facility] Provider Note   CSN: [id number] Arrival date & time: 08/30/22  1334     History Non-small cell lung cancer IV Chief Complaint  Patient presents with   Weakness    Kristi Jennings is a 77 y.o. female.  Patient is a 77 y.o. female  with oncologic history of Stage IV (T3, N2, M1 C) non-small cell lung cancer followed by Dr. [name] presents to the ED with chief complaint of bilateral leg weakness which has been ongoing for the past few days however worsened over the last 48 hours.  She was seen at the cancer center yesterday, received some IV fluids was found to have a sodium of 123 was hydrated in discharged home.  She is currently taking oral chemotherapy and has a scheduled appointment on the 15th for follow-up.  Patient reports today she was in the shower when suddenly she felt like she did not have any strength to her legs, had to sit down on the shower.  She reports that she feels like both of her legs are now weak, this has been ongoing for 48 hours, however worsened over the last 24 hours. Husband at the bedside, reports that patient was sitting in a very hard chair yesterday for 2 hours receiving her treatment.  Patient is also endorsing some pain across her entire chest, however states that this is likely due to her cancer, she states "this medication that I am taking from a chemo, messes with my head ".  She does endorse some short-term memory due to her diagnoses.  He does not have any upper extremity weakness, denies headache, denies any trauma.  The history is provided by the patient and medical records.  Weakness Severity:  Moderate Onset quality:  Gradual Duration:  48 hours Associated symptoms: chest pain   Associated symptoms: no abdominal pain, no fever, no headaches, no nausea, no shortness of breath and no vomiting        Home Medications Prior to Admission medications   Medication Sig Start Date End Date  Taking? Authorizing Provider  acetaminophen (TYLENOL) 650 MG CR tablet Take 1,300 mg by mouth 3 (three) times daily as needed for pain.    [provider]  adagrasib (KRAZATI) 200 MG tablet Take 3 tablets (600 mg total) by mouth 2 (two) times daily. 08/04/22   Si Gaul, MD  albuterol (VENTOLIN HFA) 108 (90 Base) MCG/ACT inhaler Inhale 1 puff into the lungs every 4 (four) hours as needed for wheezing or shortness of breath. 08/29/22   Walisiewicz, Caroleen Hamman, PA-C  apixaban (ELIQUIS) 2.5 MG TABS tablet Take 1 tablet (2.5 mg total) by mouth 2 (two) times daily. 08/28/22   Si Gaul, MD  atenolol (TENORMIN) 25 MG tablet Take 12.5 mg by mouth at bedtime. 07/22/20   [provider]  Calcium-Vitamin D-Vitamin K (CALCIUM + D) [phone] MG-UNT-MCG CHEW     [provider]  Cholecalciferol (VITAMIN D) 50 MCG (2000 UT) tablet     [provider]  diclofenac Sodium (VOLTAREN) 1 % GEL Apply 4 g topically 4 (four) times daily. Patient taking differently: Apply 4 g topically 4 (four) times daily as needed (pain). 12/26/20   Rolan Bucco, MD  dronabinol (MARINOL) 10 MG capsule Take 1 capsule (10 mg total) by mouth daily. Patient not taking: Reported on 08/08/2022 07/04/22   Pickenpack-Cousar, Arty Baumgartner, NP  escitalopram (LEXAPRO) 20 MG tablet Take 1 tablet (20 mg total) by mouth at  bedtime. 08/07/22   [name], NP  fexofenadine (ALLEGRA) 180 MG tablet Take 180 mg by mouth daily.    [provider]  fluocinonide (LIDEX) 0.05 % external solution Apply topically daily. 09/08/21   [provider]  folic acid (FOLVITE) 1 MG tablet Take 1 tablet (1 mg total) by mouth daily. 07/10/22   Pickenpack-Cousar, Arty Baumgartner, NP  gabapentin (NEURONTIN) 300 MG capsule Take 1 capsule (300 mg total) by mouth at bedtime. 08/25/22   Pickenpack-Cousar, Arty Baumgartner, NP  HYDROmorphone (DILAUDID) 4 MG tablet Take 4 mg by mouth every 6 (six) hours as needed for severe pain. Quantity of  120 tablets 05/12/21   Pickenpack-Cousar, Athena N, NP  lidocaine (LIDODERM) 5 % Place 1 patch onto the skin daily. Remove & Discard patch within 12 hours or as directed by MD 08/25/22   Pickenpack-Cousar, Arty Baumgartner, NP  mirtazapine (REMERON) 15 MG tablet Take 15 mg by mouth at bedtime.    [provider]  morphine (MS CONTIN) 30 MG 12 hr tablet Take 1 tablet (30 mg total) by mouth every 12 (twelve) hours. 08/26/22   Pickenpack-Cousar, Arty Baumgartner, NP  Multiple Vitamin (MULTI VITAMIN) TABS     [provider]  ondansetron (ZOFRAN-ODT) 4 MG disintegrating tablet Take 1 tablet (4 mg total) by mouth 2 (two) times daily as needed for nausea or vomiting. 11/15/21   Pickenpack-Cousar, Arty Baumgartner, NP  OVER THE COUNTER MEDICATION Take 2 Cans by mouth 2 (two) times daily as needed (for nutrition). Premier protein    [provider]  prochlorperazine (COMPAZINE) 10 MG tablet Take 1 tablet (10 mg total) by mouth every 6 (six) hours as needed. 07/06/22   Heilingoetter, Cassandra L, PA-C  Salicylic Acid (SCALPICIN EX) Apply 1 application. topically daily as needed.    [provider]  senna-docusate (SENOKOT-S) 8.6-50 MG tablet Take 1 tablet by mouth 2 (two) times daily. 09/28/21   Heilingoetter, Cassandra L, PA-C  triamcinolone ointment (KENALOG) 0.1 % Apply 1 application. topically 2 (two) times daily. 07/20/21   [provider]      Allergies    Shellfish-derived products and Augmentin [amoxicillin-pot clavulanate]    Review of Systems   Review of Systems  Constitutional:  Negative for chills and fever.  HENT:  Negative for sore throat.   Respiratory:  Negative for shortness of breath.   Cardiovascular:  Positive for chest pain.  Gastrointestinal:  Negative for abdominal pain, nausea and vomiting.  Genitourinary:  Negative for flank pain.  Musculoskeletal:  Negative for back pain.  Neurological:  Positive for weakness. Negative for light-headedness and headaches.  All  other systems reviewed and are negative.   Physical Exam Updated Vital Signs BP 137/64   Pulse (!) 54   Temp 98 F (36.7 C) (Oral)   Resp 20   Ht 5\' 6"  (1.676 m)   Wt 42.6 kg   SpO2 100%   BMI 15.17 kg/m  Physical Exam Vitals and nursing note reviewed.  Constitutional:      Appearance: Normal appearance.  HENT:     Head: Normocephalic and atraumatic.     Mouth/Throat:     Mouth: Mucous membranes are moist.  Eyes:     Pupils: Pupils are equal, round, and reactive to light.  Cardiovascular:     Rate and Rhythm: Normal rate.  Pulmonary:     Effort: Pulmonary effort is normal.     Breath sounds: Wheezing present.  Abdominal:     General: Abdomen  is flat.     Tenderness: There is no abdominal tenderness.  Musculoskeletal:     Cervical back: Normal range of motion and neck supple.  Skin:    General: Skin is warm and dry.  Neurological:     Mental Status: She is alert and oriented to person, place, and time.     Cranial Nerves: No dysarthria.     Motor: Weakness present.     Gait: Gait is intact.     Comments: No facial asymmetry, no dysarthria. Normal finger to nose Moves all upper extremities Weakness noted to the left greater than right 4/5 strength to the LLE  Sensation is diminished to BLE along entire legs.       ED Results / Procedures / Treatments   Labs (all labs ordered are listed, but only abnormal results are displayed) Labs Reviewed  CBC WITH DIFFERENTIAL/PLATELET - Abnormal; Notable for the following components:      Result Value   RBC 3.55 (*)    Hemoglobin 10.4 (*)    HCT 31.4 (*)    Monocytes Absolute 1.4 (*)    Abs Immature Granulocytes 0.12 (*)    All other components within normal limits  COMPREHENSIVE METABOLIC PANEL - Abnormal; Notable for the following components:   Sodium 126 (*)    Chloride 92 (*)    Glucose, Bld 100 (*)    Calcium 8.4 (*)    Total Protein 6.4 (*)    Albumin 3.3 (*)    All other components within normal limits   PROTIME-INR - Abnormal; Notable for the following components:   Prothrombin Time 15.9 (*)    INR 1.3 (*)    All other components within normal limits  TROPONIN I (HIGH SENSITIVITY)  TROPONIN I (HIGH SENSITIVITY)    EKG None  Radiology CT HEAD WO CONTRAST ( )  Result Date: 08/30/2022 CLINICAL DATA:  Sudden numbness and tingling in the legs and feet. 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 Metastatic lung cancer. EXAM: CT HEAD WITHOUT CONTRAST TECHNIQUE: Contiguous axial images were obtained from the base of the skull through the vertex without intravenous contrast. RADIATION DOSE REDUCTION: This exam was performed according to the departmental dose-optimization program which includes automated exposure control, adjustment of the mA and/or kV according to patient size and/or use of iterative reconstruction technique. COMPARISON:  MRI 06/28/2022 FINDINGS: Brain: No focal abnormality is seen affecting the brainstem or cerebellum. Cerebral hemispheres show atrophy and chronic small-vessel ischemic change throughout the white matter. No sign of acute infarction. Axial image 20 suggests a possible 3-4 mm slightly hyperdense focus in the subcortical white matter of the right frontal lobe. Nothing was seen in this location on the previous MRI. This could be interval development of a metastasis. Consider repeat MRI. No evidence of hemorrhage, hydrocephalus or extra-axial collection. Vascular: There is atherosclerotic calcification of the major vessels at the base of the brain. 5 mm aneurysm shown by MRI cannot be specifically seen by noncontrast CT. Skull: Normal Sinuses/Orbits: Clear/normal Other: None IMPRESSION: 1. Atrophy and chronic small-vessel ischemic change of the white matter. No sign of acute infarction. 2. Axial image 20 suggests a possible 3-4 mm slightly hyperdense focus in the subcortical white matter of the right frontal lobe. Nothing was seen in this location on the previous MRI. This could be  interval development of a small metastasis. Consider repeat MRI. 3. 5 mm aneurysm shown by MRI cannot be specifically seen by noncontrast CT. Electronically Signed   By: Scherrie Bateman.D.  On: 08/30/2022 16:04   DG Lumbar Spine Complete  Result Date: 08/30/2022 CLINICAL DATA:  Bilateral lower extremity weakness several days. Increasing weakness/dehydration recently. Stage IV lung cancer. EXAM: LUMBAR SPINE - COMPLETE 4+ VIEW COMPARISON:  CT 07/04/2022 FINDINGS: Vertebral body alignment is normal. Evidence of patient's known severe compression fractures of T8 and T12 thought to be pathologic due to underlying metastatic disease from known lung cancer. No new compression fractures. Moderate spondylosis of the lumbar spine to include moderate facet arthropathy over the lower lumbar spine. Moderate multilevel disc disease over the lumbar spine most prominent at the L2-3 and L4-5 levels. Mild degenerative change of the hips. Remainder of the exam is unchanged. IMPRESSION: 1. No acute findings. 2. Moderate spondylosis of the lumbar spine with moderate multilevel disc disease. 3. Known severe compression fractures of T8 and T12 thought to be pathologic due to known underlying metastatic lung cancer Electronically Signed   By: Elberta Fortis M.D.   On: 08/30/2022 14:51   DG Chest 2 View  Result Date: 08/30/2022 CLINICAL DATA:  Weakness EXAM: CHEST - 2 VIEW COMPARISON:  CXR 01/05/21 FINDINGS: No pleural effusion. No pneumothorax. Increased asymmetric elevation of the right hemidiaphragm compared to 01/05/2021 likely secondary to interval volume loss. Unchanged appearance of the right lung apex compared to recent CT chest dated 07/04/2022. Normal cardiac and mediastinal contours. No radiographically apparent displaced rib fractures. Visualized upper abdomen is unremarkable. Vertebral body heights are unchanged compared to recent prior CT chest abdomen and pelvis dated 07/04/2022 with compression deformities in the mid  and lower thoracic spine IMPRESSION: Persistent mass like thickening at the right lung apex is unchanged compared to 07/04/22 CT chest/abdomen/pelvis. No new focal airspace opacity. Electronically Signed   By: Lorenza Cambridge M.D.   On: 08/30/2022 14:37    Procedures Procedures    Medications Ordered in ED Medications - No data to display  ED Course/ Medical Decision Making/ A&P Clinical Course as of 08/30/22 1656  Wed Aug 30, 2022  1506 Sodium(!): 126 [JS]    Clinical Course User Index [JS] Claude Manges, PA-C                             Medical Decision Making Amount and/or Complexity of Data Reviewed Labs: ordered. Decision-making details documented in ED Course. Radiology: ordered.  Risk Decision regarding hospitalization.    This patient presents to the ED for concern of weakness, this involves a number of treatment options, and is a complaint that carries with it a high risk of complications and morbidity.  The differential diagnosis includes electrolyte derangement, metastatic disease, CVA versus trauma.    Co morbidities: Discussed in HPI   Brief History:  See HPI.   EMR reviewed including pt PMHx, past surgical history and past visits to ER.   See HPI for more details   Lab Tests:  I ordered and independently interpreted labs.  The pertinent results include:    Labs notable for CBC with no leukocytosis, hemoglobin is slightly negative. CMP with low sodium, creatine is within normal limits. LFTS are within normal limits. Remarkable for some hyponatremia at 126, although improved from yesterday which was 123.  Imaging Studies:  DG Lumbar spine showed: 1. No acute findings.  2. Moderate spondylosis of the lumbar spine with moderate multilevel  disc disease.  3. Known severe compression fractures of T8 and T12 thought to be  pathologic due to known underlying metastatic lung cancer  DG Chest xray showed: Persistent mass like thickening at the right lung  apex is unchanged  compared to 07/04/22 CT chest/abdomen/pelvis. No new focal airspace  opacity.    Cardiac Monitoring:  The patient was maintained on a cardiac monitor.  I personally viewed and interpreted the cardiac monitored which showed an underlying rhythm of: NSR 53 EKG non-ischemic  Medicines ordered:  N/A  Consults:  I requested consultation with Oncology,  and discussed lab and imaging findings as well as pertinent plan - they did not   Reevaluation:  After the interventions noted above I re-evaluated patient and found that they have :stayed the same   Social Determinants of Health:  The patient's social determinants of health were a factor in the care of this patient   Problem List / ED Course:  Patient here with a diagnoses of non-small cell carcinoma stage IV, currently undergoing oral chemotherapy by Dr. Shirline FreesMohammed, evaluated in the clinic yesterday and received fluids to help with hydration and noted to have a sodium of 123, today she returns that she experiences worsening weakness to bilateral lower extremities, left greater than right.  She reports legs were, therefore she had to sit down.  She has not had any trauma.  No back pain that she reports.  On exam she does have decreased strength with left leg extension along with flexion.  She does have episodes of short-term memory, when she is not recalling the entire episode, reports that this is likely a side effect from her [place]. They will hold this medication until April 15 when patient is due for reevaluation.  The rest of her labs on today's visit are benign, aside from her sodium which is now improved at 126.  Her exam is concerning for new mets.  I did discuss this case with my attending Dr. Manus Gunningancour, we agree on MRI of the lumbar, thoracic spine along with head. Her head CT does have a new finding compared to the recent MRI of her brain.  Therefore she will obtain additional imaging of MRI brain.  She is reporting  some pain across her chest, she is anticoagulated therefore have a low suspicion for pulmonary embolism as she is not hypoxic or tachycardic.  She does report this pain has been ongoing, and seems to be somewhat chronic according to husband at the bedside. I discussed with the patient along with family at the bedside.  Patient will be brought In order to further evaluate worsening metastatic disease.   Dispostion:  After consideration of the diagnostic results and the patients response to treatment, I feel that the patent would benefit from admission for further management     Portions of this note were generated with Dragon dictation software. Dictation errors may occur despite best attempts at proofreading.   Final Clinical Impression(s) / ED Diagnoses Final diagnoses:  Weakness    Rx / DC Orders ED Discharge Orders     None         Claude MangesSoto, Jadence Kinlaw, PA-C 08/30/22 1656    Glynn Octaveancour, Stephen, MD 08/30/22 [phone]

## 2022-08-30 NOTE — ED Triage Notes (Signed)
BIB EMS from home for bilateral leg weakness that has been occurring for a few days, worsened within the last 24-48 hrs. Seen at cancer center yesterday and received IV fluids for the weakness/dehydration. Active stage 4 lung cancer, taking oral chemo daily.

## 2022-08-30 NOTE — ED Notes (Signed)
Dr. Rancour at bedside. 

## 2022-08-31 ENCOUNTER — Inpatient Hospital Stay (HOSPITAL_COMMUNITY): Payer: Medicare HMO

## 2022-08-31 ENCOUNTER — Other Ambulatory Visit (HOSPITAL_COMMUNITY): Payer: Self-pay

## 2022-08-31 DIAGNOSIS — E871 Hypo-osmolality and hyponatremia: Secondary | ICD-10-CM | POA: Diagnosis not present

## 2022-08-31 DIAGNOSIS — R29898 Other symptoms and signs involving the musculoskeletal system: Secondary | ICD-10-CM | POA: Diagnosis not present

## 2022-08-31 LAB — URINALYSIS, ROUTINE W REFLEX MICROSCOPIC
Glucose, UA: NEGATIVE mg/dL
Hgb urine dipstick: NEGATIVE
Ketones, ur: NEGATIVE mg/dL
Leukocytes,Ua: NEGATIVE
Nitrite: NEGATIVE
Protein, ur: NEGATIVE mg/dL
Specific Gravity, Urine: 1.01 (ref 1.005–1.030)
pH: 7 (ref 5.0–8.0)

## 2022-08-31 LAB — SODIUM
Sodium: 127 mmol/L — ABNORMAL LOW (ref 135–145)
Sodium: 126 mmol/L — ABNORMAL LOW (ref 135–145)
Sodium: 124 mmol/L — ABNORMAL LOW (ref 135–145)
Sodium: 126 mmol/L — ABNORMAL LOW (ref 135–145)

## 2022-08-31 LAB — GLUCOSE, CAPILLARY
Glucose-Capillary: 92 mg/dL (ref 70–99)
Glucose-Capillary: 96 mg/dL (ref 70–99)
Glucose-Capillary: 127 mg/dL — ABNORMAL HIGH (ref 70–99)
Glucose-Capillary: 124 mg/dL — ABNORMAL HIGH (ref 70–99)

## 2022-08-31 LAB — CORTISOL: Cortisol, Plasma: 14.3 ug/dL

## 2022-08-31 LAB — SODIUM, URINE, RANDOM: Sodium, Ur: 101 mmol/L

## 2022-08-31 MED ORDER — MELATONIN 5 MG PO TABS
10.0000 mg | ORAL_TABLET | Freq: Every evening | ORAL | Status: DC | PRN
  Administered 2022-08-31: 10 mg via ORAL
  Filled 2022-08-31 (×2): qty 2

## 2022-08-31 MED ORDER — MORPHINE SULFATE ER 30 MG PO TBCR
30.0000 mg | EXTENDED_RELEASE_TABLET | Freq: Two times a day (BID) | ORAL | Status: DC
  Administered 2022-08-31 – 2022-09-12 (×24): 30 mg via ORAL
  Filled 2022-08-31 (×24): qty 1

## 2022-08-31 MED ORDER — PREDNISONE 5 MG PO TABS
5.0000 mg | ORAL_TABLET | Freq: Every day | ORAL | Status: DC
  Administered 2022-09-01: 5 mg via ORAL
  Filled 2022-08-31: qty 1

## 2022-08-31 MED ORDER — SENNOSIDES-DOCUSATE SODIUM 8.6-50 MG PO TABS
1.0000 | ORAL_TABLET | Freq: Two times a day (BID) | ORAL | Status: DC
  Administered 2022-08-31 – 2022-09-11 (×22): 1 via ORAL
  Filled 2022-08-31 (×24): qty 1

## 2022-08-31 MED ORDER — ATENOLOL 25 MG PO TABS
25.0000 mg | ORAL_TABLET | ORAL | Status: DC
  Administered 2022-09-01 – 2022-09-11 (×5): 25 mg via ORAL
  Filled 2022-08-31 (×6): qty 1

## 2022-08-31 MED ORDER — MELATONIN 5 MG PO TABS
5.0000 mg | ORAL_TABLET | Freq: Every evening | ORAL | Status: DC | PRN
  Administered 2022-08-31: 5 mg via ORAL
  Filled 2022-08-31: qty 1

## 2022-08-31 MED ORDER — ESCITALOPRAM OXALATE 20 MG PO TABS
20.0000 mg | ORAL_TABLET | Freq: Every day | ORAL | Status: DC
  Administered 2022-08-31 – 2022-09-11 (×12): 20 mg via ORAL
  Filled 2022-08-31: qty 1
  Filled 2022-08-31: qty 2
  Filled 2022-08-31 (×2): qty 1
  Filled 2022-08-31 (×8): qty 2

## 2022-08-31 MED ORDER — SODIUM CHLORIDE 0.9 % IV BOLUS
1000.0000 mL | Freq: Once | INTRAVENOUS | Status: AC
  Administered 2022-08-31: 1000 mL via INTRAVENOUS

## 2022-08-31 MED ORDER — HYDROMORPHONE HCL 2 MG PO TABS
4.0000 mg | ORAL_TABLET | Freq: Four times a day (QID) | ORAL | Status: DC | PRN
  Administered 2022-08-31 – 2022-09-11 (×15): 4 mg via ORAL
  Filled 2022-08-31 (×19): qty 2

## 2022-08-31 MED ORDER — POLYETHYLENE GLYCOL 3350 17 G PO PACK
17.0000 g | PACK | Freq: Two times a day (BID) | ORAL | Status: DC
  Administered 2022-08-31 – 2022-09-11 (×18): 17 g via ORAL
  Filled 2022-08-31 (×24): qty 1

## 2022-08-31 MED ORDER — APIXABAN 2.5 MG PO TABS
2.5000 mg | ORAL_TABLET | Freq: Two times a day (BID) | ORAL | Status: DC
  Administered 2022-08-31 – 2022-09-01 (×3): 2.5 mg via ORAL
  Filled 2022-08-31 (×3): qty 1

## 2022-08-31 MED ORDER — GABAPENTIN 300 MG PO CAPS
300.0000 mg | ORAL_CAPSULE | Freq: Every day | ORAL | Status: DC
  Administered 2022-08-31 – 2022-09-11 (×12): 300 mg via ORAL
  Filled 2022-08-31 (×12): qty 1

## 2022-08-31 MED ORDER — IOHEXOL 300 MG/ML  SOLN
75.0000 mL | Freq: Once | INTRAMUSCULAR | Status: AC | PRN
  Administered 2022-08-31: 75 mL via INTRAVENOUS

## 2022-08-31 NOTE — NC FL2 (Signed)
[place] [facility] LEVEL OF CARE FORM     IDENTIFICATION  Patient Name: Kristi Jennings Birthdate: [birth year] Sex: female Admission Date (Current Location): 08/30/2022  [facility] and [place] Number:  Producer, television/film/video and Address:  [facility],  [address] [place], [place] [address]      Provider Number: [id number]  Attending Physician Name and Address:  Narda Bonds, MD  Relative Name and Phone Number:  [name] Mccard(spouse)[phone]    Current Level of Care: Hospital Recommended Level of Care: Skilled Nursing Facility Prior Approval Number:    Date Approved/Denied:   PASRR Number: [id number]  Discharge Plan: SNF    Current Diagnoses: Patient Active Problem List   Diagnosis Date Noted   Hyponatremia 08/30/2022   Palliative care patient 05/12/2021   Muscle ache 04/13/2021   Metastatic bone tumor 01/20/2021   Pulmonary emboli 01/01/2021   Compression fracture of T12 vertebra 01/01/2021   Lumbar radiculopathy 01/01/2021   Non-small cell carcinoma of lung, stage 4, right 11/30/2020   Encounter for antineoplastic chemotherapy 11/30/2020   Encounter for antineoplastic immunotherapy 11/30/2020   Cancer associated pain 11/30/2020   Malnutrition, calorie 11/30/2020   Polymyalgia rheumatica 08/12/2019   Allergic rhinitis 06/10/2015   Anxiety 06/10/2015   Benign essential hypertension 06/10/2015    Orientation RESPIRATION BLADDER Height & Weight     Self, Time, Situation, Place  Normal Continent Weight: 42.6 kg Height:  5\' 6"  (167.6 cm)  BEHAVIORAL SYMPTOMS/MOOD NEUROLOGICAL BOWEL NUTRITION STATUS      Continent Diet (Regular)  AMBULATORY STATUS COMMUNICATION OF NEEDS Skin   Limited Assist Verbally Normal                       Personal Care Assistance Level of Assistance  Bathing, Feeding, Dressing Bathing Assistance: Limited assistance Feeding assistance: Limited assistance Dressing Assistance: Limited assistance     Functional  Limitations Info  Sight, Hearing, Speech Sight Info: Impaired (eyeglasses) Hearing Info: Adequate Speech Info: Adequate    SPECIAL CARE FACTORS FREQUENCY  PT (By licensed PT), OT (By licensed OT)     PT Frequency: 5x week OT Frequency: 5x week            Contractures Contractures Info: Not present    Additional Factors Info  Code Status, Allergies Code Status Info: Full Allergies Info: Shellfish-derived Products, Augmentin (Amoxicillin-pot Clavulanate)           Current Medications (08/31/2022):  This is the current hospital active medication list Current Facility-Administered Medications  Medication Dose Route Frequency Provider Last Rate Last Admin   0.9 %  sodium chloride infusion   Intravenous Continuous Lurline Del, MD   Stopped at 08/31/22 0000   acetaminophen (TYLENOL) tablet 650 mg  650 mg Oral Q6H PRN Lurline Del, MD   650 mg at 08/31/22 9373   Or   acetaminophen (TYLENOL) suppository 650 mg  650 mg Rectal Q6H PRN Lurline Del, MD       albuterol (PROVENTIL) (2.5 MG/3ML) 0.083% nebulizer solution 2.5 mg  2.5 mg Nebulization Q2H PRN Lurline Del, MD       apixaban Everlene Balls) tablet 2.5 mg  2.5 mg Oral BID Anthoney Harada, NP   2.5 mg at 08/31/22 0829   melatonin tablet 5 mg  5 mg Oral QHS PRN Anthoney Harada, NP   5 mg at 08/31/22 0428   ondansetron (ZOFRAN) tablet 4 mg  4 mg Oral Q6H PRN Lurline Del,  MD       Or   ondansetron (ZOFRAN) injection 4 mg  4 mg Intravenous Q6H PRN Lurline Del, MD         Discharge Medications: Please see discharge summary for a list of discharge medications.  Relevant Imaging Results:  Relevant Lab Results:   Additional Information SS#[id number]  Tarahji Ramthun, [name], RN

## 2022-08-31 NOTE — TOC Initial Note (Addendum)
Transition of Care [facility]) - Initial/Assessment Note    Patient Details  Name: Kristi Jennings MRN: [id number] Date of Birth: 1946/05/02  Transition of Care [facility]) CM/SW Contact:    Lanier Clam, RN Phone Number: 08/31/2022, 10:58 AM  Clinical Narrative: From home w/spouse. May need PT cons if appropriate.                  Expected Discharge Plan: Home/Self Care Barriers to Discharge: Continued Medical Work up   Patient Goals and CMS Choice Patient states their goals for this hospitalization and ongoing recovery are:: Home CMS Medicare.gov Compare Post Acute Care list provided to:: Patient Choice offered to / list presented to : Patient [place] ownership interest in [facility].provided to:: Patient    Expected Discharge Plan and Services   Discharge Planning Services: CM Consult   Living arrangements for the past 2 months: Single Family Home                                      Prior Living Arrangements/Services Living arrangements for the past 2 months: Single Family Home Lives with:: Spouse Patient language and need for interpreter reviewed:: Yes Do you feel safe going back to the place where you live?: Yes      Need for Family Participation in Patient Care: Yes (Comment) Care giver support system in place?: Yes (comment) Current home services: DME (rollator,cane) Criminal Activity/Legal Involvement Pertinent to Current Situation/Hospitalization: No - Comment as needed  Activities of Daily Living   ADL Screening (condition at time of admission) Patient's cognitive ability adequate to safely complete daily activities?: No Is the patient deaf or have difficulty hearing?: No Does the patient have difficulty seeing, even when wearing glasses/contacts?: No Does the patient have difficulty concentrating, remembering, or making decisions?: Yes Patient able to express need for assistance with ADLs?: Yes Does the patient have difficulty dressing or  bathing?: Yes Independently performs ADLs?: No Communication: Needs assistance Is this a change from baseline?: Change from baseline, expected to last <3 days Dressing (OT): Needs assistance Is this a change from baseline?: Change from baseline, expected to last <3days Feeding: Independent Bathing: Needs assistance Is this a change from baseline?: Change from baseline, expected to last <3 days Toileting: Needs assistance Is this a change from baseline?: Change from baseline, expected to last <3 days In/Out Bed: Needs assistance Is this a change from baseline?: Change from baseline, expected to last <3 days Walks in Home: Needs assistance Is this a change from baseline?: Change from baseline, expected to last <3 days Does the patient have difficulty walking or climbing stairs?: Yes Weakness of Legs: Both Weakness of Arms/Hands: None  Permission Sought/Granted Permission sought to share information with : Case Manager Permission granted to share information with : Yes, Verbal Permission Granted  Share Information with NAME: Case manager           Emotional Assessment Appearance:: Appears stated age Attitude/Demeanor/Rapport: Gracious Affect (typically observed): Accepting Orientation: : Oriented to Self, Oriented to Place, Oriented to  Time, Oriented to Situation Alcohol / Substance Use: Not Applicable Psych Involvement: No (comment)  Admission diagnosis:  Hyponatremia [E87.1] Weakness [R53.1] Patient Active Problem List   Diagnosis Date Noted   Hyponatremia 08/30/2022   Palliative care patient 05/12/2021   Muscle ache 04/13/2021   Metastatic bone tumor 01/20/2021   Pulmonary emboli 01/01/2021   Compression fracture of T12  vertebra 01/01/2021   Lumbar radiculopathy 01/01/2021   Non-small cell carcinoma of lung, stage 4, right 11/30/2020   Encounter for antineoplastic chemotherapy 11/30/2020   Encounter for antineoplastic immunotherapy 11/30/2020   Cancer associated pain  11/30/2020   Malnutrition, calorie 11/30/2020   Polymyalgia rheumatica 08/12/2019   Allergic rhinitis 06/10/2015   Anxiety 06/10/2015   Benign essential hypertension 06/10/2015   PCP:  [name], NP Pharmacy:   [facility] [address] [place] [address] Phone: [phone] Fax: [phone]  [facility] [address] - [place], [place] - [address] [place] [address] Phone: [phone] Fax: [phone]     Social Determinants of Health (SDOH) Social History: SDOH Screenings   Depression [phone]): Low Risk  (10/11/2021)  Tobacco Use: Medium Risk (08/30/2022)   SDOH Interventions:     Readmission Risk Interventions    01/06/2021    1:42 PM  Readmission Risk Prevention Plan  Transportation Screening Complete  PCP or Specialist Appt within 3-5 Days Complete  HRI or Home Care Consult Complete  Social Work Consult for Recovery Care Planning/Counseling Complete  Palliative Care Screening Complete  Medication Review Oceanographer) Complete

## 2022-08-31 NOTE — TOC Progression Note (Signed)
Transition of Care [facility]) - Progression Note    Patient Details  Name: Kristi Jennings MRN: [id number] Date of Birth: [birth year]  Transition of Care [facility]) CM/SW Contact  Trecia Maring, [name], RN Phone Number: 08/31/2022, 3:39 PM  Clinical Narrative:  Patient/spouse agreed to ST SNF-Await bed offers, prior auth.     Expected Discharge Plan: Skilled Nursing Facility Barriers to Discharge: Continued Medical Work up  Expected Discharge Plan and Services   Discharge Planning Services: CM Consult   Living arrangements for the past 2 months: Single Family Home                                       Social Determinants of Health (SDOH) Interventions SDOH Screenings   Depression (PHQ2-9): Low Risk  (10/11/2021)  Tobacco Use: Medium Risk (08/30/2022)    Readmission Risk Interventions    01/06/2021    1:42 PM  Readmission Risk Prevention Plan  Transportation Screening Complete  PCP or Specialist Appt within 3-5 Days Complete  HRI or Home Care Consult Complete  Social Work Consult for Recovery Care Planning/Counseling Complete  Palliative Care Screening Complete  Medication Review Oceanographer) Complete

## 2022-08-31 NOTE — Progress Notes (Signed)
PROGRESS NOTE    Kristi Jennings  ZOX:[id number] DOB: 1946/03/26 DOA: 08/30/2022 PCP: Clayborne DanaBeck, Taylor B, NP   Brief Narrative: Kristi Jennings is a 77 y.o. 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 Patient presented secondary to progressive weakness and fatigue with associated sodium of 123, presumed secondary to her oral chemotherapy and dehydration. Patient started on IV fluids and CT imaging concerning for new metastasis. MRI survey pending.   Assessment and Plan:  Symptomatic hyponatremia Presumed secondary to hyponatremia with some improvement after IV fluids. Patient also with metastatic cancer. SIADH would be a possible concern. TSH normal. Urine sodium somewhat high for dehydration; urine osmolality not obtained. -NS bolus -Repeat sodium checks -Repeat urine sodium and osmolality  Bilateral lower extremity weakness Unclear etiology. Complicated by patient's history of multiple vertebral fractures. No red flag symptoms per patient. History of severe cervical cord compression in 2022. MRI spine is ordered and pending. MRI brain is also ordered and pending. CT head with concern for possible brain metastasis. -Follow-up MRI imaging  Chest pain Likely related to known metastatic bony lesions. Involvement of the manubrium.  -Analgesics PRN  Chronic pulmonary embolism -Continue Eliquis  Metastatic non-small cell lung cancer Bone metastasis Right adrenal metastasis Patient follows with Dr. Arbutus PedMohamed. Recently on oral chemotherapy which was held secondary to hyponatremia and dehydration.  Multiple vertebral compression fractures Noted on prior imaging. Concern would be pathologic fractures from metastasis.  Ascending aortic aneurysm Noted. Blood pressure mostly  normotensive.  Prediabetes Noted. Most recent hemoglobin A1C is 5.2%, however.  Primary hypertension -Resume home atenolol  Polymyalgia rheumatica -Resume home gabapentin  Cancer related pain Chronic pain -Continue home MS Contin and dilaudid -Miralax and Senokot-S  Underweight Severe malnutrition Secondary to chronic illness. Estimated body mass index is 15.17 kg/m as calculated from the following:   Height as of this encounter: 5\' 6"  (1.676 m).   Weight as of this encounter: 42.6 kg.  Pressure injury Mid coccyx. Present on admission.   DVT prophylaxis: Eliquis Code Status:   Code Status: Full Code Family Communication: Husband at bedside Disposition Plan: Discharge home likely in 1-3 days pending workup for weakness and PT/OT recommendations   Consultants:  None  Procedures:  None  Antimicrobials: None    Subjective: Patient reports wanting to go home but is also distraught about her not being able to walk well anymore. She reports no issues with being able to have a bowel movement or to urinate.  Objective: BP (!) 161/75 (BP Location: Right Arm)   Pulse (!) 59   Temp 97.7 F (36.5 C) (Oral)   Resp 18   Ht 5\' 6"  (1.676 m)   Wt 42.6 kg   SpO2 100%   BMI 15.17 kg/m   Examination:  General exam: Appears calm and comfortable. Frail appearing. Respiratory system: Clear to auscultation. Respiratory effort normal. Cardiovascular system: S1 & S2 heard, RRR. No murmurs, rubs, gallops or clicks. Gastrointestinal system: Abdomen is nondistended, soft and nontender. No organomegaly or masses felt. Normal bowel sounds heard. Central nervous system: Alert and oriented. BLE strength is 2/5 with intact sensation. 2+ right patellar and 0+ left patellar reflexes Musculoskeletal: No edema. No calf tenderness Skin: No cyanosis. No rashes Psychiatry: Judgement and insight appear normal. Mood & affect appropriate.    Data Reviewed: I have personally reviewed  following labs  and imaging studies  CBC Lab Results  Component Value Date   WBC 8.9 08/30/2022   RBC 3.55 (L) 08/30/2022   HGB 10.4 (L) 08/30/2022   HCT 31.4 (L) 08/30/2022   MCV 88.5 08/30/2022   MCH 29.3 08/30/2022   PLT 185 08/30/2022   MCHC 33.1 08/30/2022   RDW 13.2 08/30/2022   LYMPHSABS 1.1 08/30/2022   MONOABS 1.4 (H) 08/30/2022   EOSABS 0.1 08/30/2022   BASOSABS 0.0 08/30/2022     Last metabolic panel Lab Results  Component Value Date   NA 126 (L) 08/31/2022   K 4.3 08/30/2022   CL 92 (L) 08/30/2022   CO2 27 08/30/2022   BUN 11 08/30/2022   CREATININE 0.89 08/30/2022   GLUCOSE 100 (H) 08/30/2022   GFRNONAA >60 08/30/2022   CALCIUM 8.4 (L) 08/30/2022   PROT 6.4 (L) 08/30/2022   ALBUMIN 3.3 (L) 08/30/2022   BILITOT 0.7 08/30/2022   ALKPHOS 56 08/30/2022   AST 31 08/30/2022   ALT 16 08/30/2022   ANIONGAP 7 08/30/2022    GFR: Estimated Creatinine Clearance: 36.7 mL/min (by C-G formula based on SCr of 0.89 mg/dL).  No results found for this or any previous visit (from the past 240 hour(s)).    Radiology Studies: CT HEAD WO CONTRAST ( )  Result Date: 08/30/2022 CLINICAL DATA:  Sudden numbness and tingling in the legs and feet. 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 Metastatic lung cancer. EXAM: CT HEAD WITHOUT CONTRAST TECHNIQUE: Contiguous axial images were obtained from the base of the skull through the vertex without intravenous contrast. RADIATION DOSE REDUCTION: This exam was performed according to the departmental dose-optimization program which includes automated exposure control, adjustment of the mA and/or kV according to patient size and/or use of iterative reconstruction technique. COMPARISON:  MRI 06/28/2022 FINDINGS: Brain: No focal abnormality is seen affecting the brainstem or cerebellum. Cerebral hemispheres show atrophy and chronic small-vessel ischemic change throughout the white matter. No sign of acute infarction. Axial image 20 suggests a possible  3-4 mm slightly hyperdense focus in the subcortical white matter of the right frontal lobe. Nothing was seen in this location on the previous MRI. This could be interval development of a metastasis. Consider repeat MRI. No evidence of hemorrhage, hydrocephalus or extra-axial collection. Vascular: There is atherosclerotic calcification of the major vessels at the base of the brain. 5 mm aneurysm shown by MRI cannot be specifically seen by noncontrast CT. Skull: Normal Sinuses/Orbits: Clear/normal Other: None IMPRESSION: 1. Atrophy and chronic small-vessel ischemic change of the white matter. No sign of acute infarction. 2. Axial image 20 suggests a possible 3-4 mm slightly hyperdense focus in the subcortical white matter of the right frontal lobe. Nothing was seen in this location on the previous MRI. This could be interval development of a small metastasis. Consider repeat MRI. 3. 5 mm aneurysm shown by MRI cannot be specifically seen by noncontrast CT. Electronically Signed   By: Paulina Fusi M.D.   On: 08/30/2022 16:04   DG Lumbar Spine Complete  Result Date: 08/30/2022 CLINICAL DATA:  Bilateral lower extremity weakness several days. Increasing weakness/dehydration recently. Stage IV lung cancer. EXAM: LUMBAR SPINE - COMPLETE 4+ VIEW COMPARISON:  CT 07/04/2022 FINDINGS: Vertebral body alignment is normal. Evidence of patient's known severe compression fractures of T8 and T12 thought to be pathologic due to underlying metastatic disease from known lung cancer. No new compression fractures. Moderate spondylosis of the lumbar spine to include moderate facet arthropathy over the lower lumbar spine. Moderate multilevel disc  disease over the lumbar spine most prominent at the L2-3 and L4-5 levels. Mild degenerative change of the hips. Remainder of the exam is unchanged. IMPRESSION: 1. No acute findings. 2. Moderate spondylosis of the lumbar spine with moderate multilevel disc disease. 3. Known severe compression  fractures of T8 and T12 thought to be pathologic due to known underlying metastatic lung cancer Electronically Signed   By: Elberta Fortis M.D.   On: 08/30/2022 14:51   DG Chest 2 View  Result Date: 08/30/2022 CLINICAL DATA:  Weakness EXAM: CHEST - 2 VIEW COMPARISON:  CXR 01/05/21 FINDINGS: No pleural effusion. No pneumothorax. Increased asymmetric elevation of the right hemidiaphragm compared to 01/05/2021 likely secondary to interval volume loss. Unchanged appearance of the right lung apex compared to recent CT chest dated 07/04/2022. Normal cardiac and mediastinal contours. No radiographically apparent displaced rib fractures. Visualized upper abdomen is unremarkable. Vertebral body heights are unchanged compared to recent prior CT chest abdomen and pelvis dated 07/04/2022 with compression deformities in the mid and lower thoracic spine IMPRESSION: Persistent mass like thickening at the right lung apex is unchanged compared to 07/04/22 CT chest/abdomen/pelvis. No new focal airspace opacity. Electronically Signed   By: Lorenza Cambridge M.D.   On: 08/30/2022 14:37      LOS: 1 day    Jacquelin Hawking, MD [facility] 08/31/2022, 11:00 AM   If 7PM-7AM, please contact night-coverage [email or web address]

## 2022-08-31 NOTE — Evaluation (Signed)
Occupational Therapy Evaluation Patient Details Name: Kristi Jennings MRN: [id number] DOB: [birth year] Today's Date: 08/31/2022   History of Present Illness 77 y.o. female with medical history significant of  pre diabetes, HLD, Hypertension, Polymyalgia rheumatica , Pulmonary emboli on OAC,  Ascending aortic aneurysm,DJD of the spine, Stage IV (T3, N2, M1 C) non-small cell lung cancer with bone and right adrenal metastasis followed by Dr. [name] s/p radiation , palliative chemo s/p 18 cycles . Of note patient last chemo cycle was 12/2020, treatment stopped due to disease progression  Patient has had increasing weakness  and fatigue at home x 1 week with associated muscle twitching. Dx of hyponatremia, imaging shows likely brain mets, LLE paresis, RLE weakness.   Clinical Impression   Patient admitted for the diagnosis above.  PTA she lives with her spouse, who states up until 5 days ago, the patient was walking with a RW, and could complete her own ADL from a sit to stand level.  Currently she has little to no AROM to her B legs needing Max A to total A for mobility and lower body ADL from bedlevel.  Patient is scheduled for MRI this date, and will hopefully reveal reason for weakness.  OT is indicated in the acute setting to address deficits and help with transition to the next level.  Patient will benefit from continued inpatient follow up therapy, <3 hours/day.       Recommendations for follow up therapy are one component of a multi-disciplinary discharge planning process, led by the attending physician.  Recommendations may be updated based on patient status, additional functional criteria and insurance authorization.   Assistance Recommended at Discharge Frequent or constant Supervision/Assistance  Patient can return home with the following Help with stairs or ramp for entrance;Assistance with feeding;Two people to help with walking and/or transfers;A lot of help with  bathing/dressing/bathroom;Direct supervision/assist for financial management;Assist for transportation;Assistance with cooking/housework    Functional Status Assessment  Patient has had a recent decline in their functional status and demonstrates the ability to make significant improvements in function in a reasonable and predictable amount of time.  Equipment Recommendations  BSC/3in1;Wheelchair (measurements OT);Wheelchair cushion (measurements OT)    Recommendations for Other Services       Precautions / Restrictions Precautions Precautions: Fall Precaution Comments: fell on day of admission, denies other falls in past 6 months Restrictions Weight Bearing Restrictions: No      Mobility Bed Mobility Overal bed mobility: Needs Assistance Bed Mobility: Supine to Sit, Sit to Supine     Supine to sit: Max assist Sit to supine: Max assist   General bed mobility comments: difficulty moving legs to sit or bring back in bed to lie down.  Minimal AROM noted to RLE, and no AROM to LLE.    Transfers                          Balance Overall balance assessment: Needs assistance Sitting-balance support: No upper extremity supported, Feet supported Sitting balance-Leahy Scale: Fair   Postural control: Posterior lean                                 ADL either performed or assessed with clinical judgement   ADL Overall ADL's : Needs assistance/impaired Eating/Feeding: Set up;Bed level   Grooming: Wash/dry hands;Wash/dry face;Set up;Bed level   Upper Body Bathing: Minimal assistance;Bed level   Lower Body  Bathing: Bed level;Total assistance   Upper Body Dressing : Minimal assistance;Bed level   Lower Body Dressing: Total assistance;Bed level   Toilet Transfer: Maximal assistance;+2 for safety/equipment;+2 for physical assistance;Squat-pivot                   Vision Patient Visual Report: No change from baseline       Perception      Praxis      Pertinent Vitals/Pain Pain Assessment Pain Assessment: Faces Faces Pain Scale: Hurts little more Pain Location: abdominal area Pain Descriptors / Indicators: Tightness Pain Intervention(s): Limited activity within patient's tolerance     Hand Dominance Right   Extremity/Trunk Assessment Upper Extremity Assessment Upper Extremity Assessment: RUE deficits/detail;LUE deficits/detail;Generalized weakness RUE Deficits / Details: able to move against gravity, grossly 3+/5 RUE Sensation: WNL RUE Coordination: WNL LUE Deficits / Details: able to move against gravity, grossly 3+/5 LUE Sensation: WNL LUE Coordination: WNL   Lower Extremity Assessment Lower Extremity Assessment: Defer to PT evaluation RLE Deficits / Details: ankle PF/DF +1/5, no active participation with heel slides, pt somewhat lethargic and didn't seem to understand commands for strenth testing LLE Deficits / Details: no active ankle PF/DF, no active participation with heel slides, pt somewhat lethargic and didn't seem to understand commands for strenth testing   Cervical / Trunk Assessment Cervical / Trunk Assessment: Kyphotic   Communication Communication Communication: No difficulties   Cognition Arousal/Alertness: Awake/alert Behavior During Therapy: WFL for tasks assessed/performed Overall Cognitive Status: Impaired/Different from baseline Area of Impairment: Problem solving, Awareness                     Memory: Decreased short-term memory Following Commands: Follows one step commands inconsistently   Awareness: Emergent Problem Solving: Slow processing, Requires verbal cues       General Comments   VSS on RA    Exercises     Shoulder Instructions      Home Living Family/patient expects to be discharged to:: Private residence Living Arrangements: Spouse/significant other Available Help at Discharge: Family;Available 24 hours/day Type of Home: House Home Access: Stairs to  enter Entergy CorporationEntrance Stairs-Number of Steps: 1   Home Layout: One level     Bathroom Shower/Tub: Producer, television/film/videoWalk-in shower   Bathroom Toilet: Handicapped height Bathroom Accessibility: Yes How Accessible: Accessible via walker Home Equipment: Rollator (4 wheels);Shower seat;Grab bars - tub/shower          Prior Functioning/Environment Prior Level of Function : Independent/Modified Independent             Mobility Comments: fall in bathroom 08/30/22 , walks independently with rollator ADLs Comments: independent with bathing/dressing        OT Problem List: Decreased strength;Decreased range of motion;Decreased activity tolerance;Impaired balance (sitting and/or standing);Decreased knowledge of use of DME or AE;Decreased safety awareness;Decreased cognition;Pain      OT Treatment/Interventions: Self-care/ADL training;Therapeutic exercise;Therapeutic activities;Cognitive remediation/compensation;Patient/family education;DME and/or AE instruction;Balance training    OT Goals(Current goals can be found in the care plan section) Acute Rehab OT Goals Patient Stated Goal: Return home OT Goal Formulation: With patient Time For Goal Achievement: 09/14/22 Potential to Achieve Goals: Good ADL Goals Pt Will Perform Grooming: with set-up;sitting Pt Will Perform Upper Body Bathing: with set-up;sitting Pt Will Perform Upper Body Dressing: with set-up;sitting Pt Will Transfer to Toilet: with transfer board;bedside commode;with mod assist Pt/caregiver will Perform Home Exercise Program: Increased strength;Both right and left upper extremity;With theraband;With Supervision  OT Frequency: Min 2X/week    Co-evaluation  AM-PAC OT "6 Clicks" Daily Activity     Outcome Measure Help from another person eating meals?: None Help from another person taking care of personal grooming?: None Help from another person toileting, which includes using toliet, bedpan, or urinal?: A Lot Help from  another person bathing (including washing, rinsing, drying)?: A Lot Help from another person to put on and taking off regular upper body clothing?: A Lot Help from another person to put on and taking off regular lower body clothing?: Total 6 Click Score: 15   End of Session Nurse Communication: Mobility status  Activity Tolerance: Patient limited by fatigue Patient left: in bed;with call bell/phone within reach;with family/visitor present  OT Visit Diagnosis: Unsteadiness on feet (R26.81);Muscle weakness (generalized) (M62.81);History of falling (Z91.81);Other symptoms and signs involving cognitive function                Time: 1459-1519 OT Time Calculation (min): 20 min Charges:  OT General Charges $OT Visit: 1 Visit OT Evaluation $OT Eval Moderate Complexity: 1 Mod  08/31/2022  RP, OTR/L  Acute Rehabilitation Services  Office:  [phone]   Suzanna Obey 08/31/2022, 3:26 PM

## 2022-08-31 NOTE — Evaluation (Signed)
Physical Therapy Evaluation Patient Details Name: Kristi Jennings MRN: [id number] DOB: 03/30/1946 Today's Date: 08/31/2022  History of Present Illness  77 y.o. female with medical history significant of  pre diabetes, HLD, Hypertension, Polymyalgia rheumatica , Pulmonary emboli on OAC,  Ascending aortic aneurysm,DJD of the spine, Stage IV (T3, N2, M1 C) non-small cell lung cancer with bone and right adrenal metastasis followed by Dr. Arbutus PedMohamed s/p radiation , palliative chemo s/p 18 cycles . Of note patient last chemo cycle was 12/2020, treatment stopped due to disease progression  Patient has had increasing weakness  and fatigue at home x 1 week with associated muscle twitching. Dx of hyponatremia, imaging shows likely brain mets, LLE paresis, RLE weakness.  Clinical Impression  Pt admitted with above diagnosis. Max assist for supine to sit, pt reported chest pain in sitting and requested to lie back down. Pt oriented to self and location but not to month/year. She had difficulty following 1 step commands, so this made it difficult to assess her strength. BLEs appear to be significantly weak.  Pt currently with functional limitations due to the deficits listed below (see PT Problem List). Pt will benefit from acute skilled PT to increase their independence and safety with mobility to allow discharge.          Recommendations for follow up therapy are one component of a multi-disciplinary discharge planning process, led by the attending physician.  Recommendations may be updated based on patient status, additional functional criteria and insurance authorization.  Follow Up Recommendations Can patient physically be transported by private vehicle: No     Assistance Recommended at Discharge Frequent or constant Supervision/Assistance  Patient can return home with the following  Two people to help with walking and/or transfers;A lot of help with bathing/dressing/bathroom;Assistance with  cooking/housework;Direct supervision/assist for medications management;Assist for transportation;Help with stairs or ramp for entrance;Direct supervision/assist for financial management    Equipment Recommendations Wheelchair (measurements PT);Wheelchair cushion (measurements PT)  Recommendations for Other Services  OT consult    Functional Status Assessment Patient has had a recent decline in their functional status and demonstrates the ability to make significant improvements in function in a reasonable and predictable amount of time.     Precautions / Restrictions Precautions Precautions: Fall Precaution Comments: fell on day of admission, denies other falls in past 6 months Restrictions Weight Bearing Restrictions: No      Mobility  Bed Mobility Overal bed mobility: Needs Assistance Bed Mobility: Supine to Sit, Sit to Supine     Supine to sit: Max assist Sit to supine: Max assist   General bed mobility comments: max A to advance BLEs, raise trunk and pivot hips to EOB, Pt reported chest pain while sitting edge of bed, max A to control trunk descent back to bed as pt had sudden posterior lean    Transfers                   General transfer comment: NT    Ambulation/Gait               General Gait Details: NT  Stairs            Wheelchair Mobility    Modified Rankin (Stroke Patients Only)       Balance Overall balance assessment: Needs assistance Sitting-balance support: No upper extremity supported, Feet supported Sitting balance-Leahy Scale: Poor   Postural control: Posterior lean  Pertinent Vitals/Pain Pain Assessment Pain Assessment: Faces Faces Pain Scale: Hurts whole lot Pain Location: chest pain in sitting Pain Descriptors / Indicators: Guarding Pain Intervention(s): Limited activity within patient's tolerance, Monitored during session (RN notified)    Home Living  Family/patient expects to be discharged to:: Private residence Living Arrangements: Spouse/significant other Available Help at Discharge: Family;Available 24 hours/day Type of Home: House Home Access: Stairs to enter   [employer] of Steps: 1   Home Layout: One level Home Equipment: Rollator (4 wheels);Shower seat;Grab bars - tub/shower      Prior Function Prior Level of Function : Independent/Modified Independent             Mobility Comments: fall in bathroom 08/30/22 , walks independently with rollator ADLs Comments: independent with bathing/dressing     Hand Dominance        Extremity/Trunk Assessment   Upper Extremity Assessment Upper Extremity Assessment: Defer to OT evaluation    Lower Extremity Assessment Lower Extremity Assessment: RLE deficits/detail;LLE deficits/detail;Difficult to assess due to impaired cognition RLE Deficits / Details: ankle PF/DF +1/5, no active participation with heel slides, pt somewhat lethargic and didn't seem to understand commands for strenth testing LLE Deficits / Details: no active ankle PF/DF, no active participation with heel slides, pt somewhat lethargic and didn't seem to understand commands for strenth testing    Cervical / Trunk Assessment Cervical / Trunk Assessment: Kyphotic  Communication   Communication: Receptive difficulties (seems to have trouble understanding commands)  Cognition Arousal/Alertness: Awake/alert Behavior During Therapy: WFL for tasks assessed/performed Overall Cognitive Status: Impaired/Different from baseline Area of Impairment: Following commands, Memory, Orientation, Safety/judgement                     Memory: Decreased short-term memory Following Commands: Follows one step commands inconsistently       General Comments: stated it is July 2023, oriented to self and to location, correctly stated current president        General Comments      Exercises      Assessment/Plan    PT Assessment Patient needs continued PT services  PT Problem List Decreased balance;Decreased activity tolerance;Decreased strength;Decreased range of motion;Decreased cognition;Decreased mobility;Pain       PT Treatment Interventions Functional mobility training;Therapeutic activities;Gait training;Therapeutic exercise;Balance training;Patient/family education    PT Goals (Current goals can be found in the Care Plan section)  Acute Rehab PT Goals Patient Stated Goal: to get stronger PT Goal Formulation: With patient/family Time For Goal Achievement: 09/14/22 Potential to Achieve Goals: Fair    Frequency Min 1X/week     Co-evaluation               AM-PAC PT "6 Clicks" Mobility  Outcome Measure Help needed turning from your back to your side while in a flat bed without using bedrails?: A Lot Help needed moving from lying on your back to sitting on the side of a flat bed without using bedrails?: Total Help needed moving to and from a bed to a chair (including a wheelchair)?: Total Help needed standing up from a chair using your arms (e.g., wheelchair or bedside chair)?: Total Help needed to walk in hospital room?: Total Help needed climbing 3-5 steps with a railing? : Total 6 Click Score: 7    End of Session   Activity Tolerance: Patient limited by pain Patient left: in bed;with bed alarm set;with call bell/phone within reach;with family/visitor present Nurse Communication: Mobility status PT Visit Diagnosis: Difficulty in walking,  not elsewhere classified (R26.2);Pain;Other abnormalities of gait and mobility (R26.89);History of falling (Z91.81)    Time: 7371-0626 PT Time Calculation (min) (ACUTE ONLY): 20 min   Charges:   PT Evaluation $PT Eval Moderate Complexity: 1 Mod          Tamala Ser PT 08/31/2022  Acute Rehabilitation Services  Office [phone]

## 2022-08-31 NOTE — Hospital Course (Addendum)
Kristi Jennings is a 77 y.o. 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 Patient presented secondary to progressive weakness and fatigue with associated sodium of 123, presumed secondary to her oral chemotherapy and dehydration. Patient started on IV fluids and CT imaging concerning for new metastasis. MRI thoracic and lumbar spine with multiple areas of pathologic fractures in addition to spinal stenosis and soft tissue mass involving the left paravertebral soft tissues at T2-T4.

## 2022-08-31 NOTE — Progress Notes (Deleted)
Palliative Medicine [facility]  [phone]) [phone] Fax:([phone]) [phone]   Name: Kristi Jennings Date: 08/31/2022 MRN: [id number]  DOB: 1946-01-18  Patient Care Team: [name], NP as PCP - General (Family Medicine) [facility] as Registered Nurse [facility] and Palliative Medicine) Pickenpack-Cousar, Arty Baumgartner, NP as Nurse Practitioner (Nurse Practitioner)     INTERVAL HISTORY: Kristi Jennings is a 77 y.o. 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 Palliative ask to see for symptom management.  SOCIAL HISTORY:     reports that she quit smoking about 13 years ago. Her smoking use included cigarettes. She has a 43.00 pack-year smoking history. She has never used smokeless tobacco. She reports that she does not currently use alcohol. She reports that she does not use drugs.  ADVANCE DIRECTIVES:  None on file   CODE STATUS:   PAST MEDICAL HISTORY: Past Medical History:  Diagnosis Date   Borderline diabetes mellitus    Bronchogenic cancer of right lung    Dyslipidemia    History of radiation therapy    right chest 12/21/2020-12/24/2020  Dr Antony Blackbird   History of radiation therapy    right lung 06/07/2021-06/17/2021  Dr Antony Blackbird   Hypertension    Polymyalgia rheumatica    ALLERGIES:  is allergic to shellfish-derived products and augmentin [amoxicillin-pot clavulanate].  MEDICATIONS:  No current facility-administered medications for this visit.   No current outpatient medications on file.   Facility-Administered Medications Ordered in Other Visits  Medication Dose Route Frequency Provider Last Rate Last Admin   0.9 %  sodium chloride infusion   Intravenous Continuous Lurline Del, MD   Stopped at 08/31/22 0000   acetaminophen (TYLENOL) tablet 650 mg  650 mg Oral Q6H PRN Lurline Del, MD   650 mg at 08/31/22 1700   Or    acetaminophen (TYLENOL) suppository 650 mg  650 mg Rectal Q6H PRN Lurline Del, MD       albuterol (PROVENTIL) (2.5 MG/3ML) 0.083% nebulizer solution 2.5 mg  2.5 mg Nebulization Q2H PRN Lurline Del, MD       apixaban Everlene Balls) tablet 2.5 mg  2.5 mg Oral BID Anthoney Harada, NP   2.5 mg at 08/31/22 0829   melatonin tablet 5 mg  5 mg Oral QHS PRN Anthoney Harada, NP   5 mg at 08/31/22 0428   ondansetron (ZOFRAN) tablet 4 mg  4 mg Oral Q6H PRN Lurline Del, MD       Or   ondansetron [facility]) injection 4 mg  4 mg Intravenous Q6H PRN Lurline Del, MD        VITAL SIGNS: There were no vitals taken for this visit. There were no vitals filed for this visit.   Estimated body mass index is 15.17 kg/m as calculated from the following:   Height as of 08/30/22: 5\' 6"  (1.676 m).   Weight as of 08/30/22: 94 lb (42.6 kg).  PERFORMANCE STATUS (ECOG) : 1 - Symptomatic but completely ambulatory  Assessment NAD, ambulatory RRR Normal breathing pattern AAO x3, emotional   IMPRESSION:    PLAN:  MS Contin to 30mg  twice daily. We will continue to closely evaluate and began to wean down and eventually discontinue if possible.  Dronabinol 10 mg to once daily for appetite.  Continue mirtazapine 15 mg as prescribed We will continue to closely monitor.  I will  plan to see patient back in 4-6 weeks in collaboration with her other appointments.    Patient expressed understanding and was in agreement with this plan. She also understands that She can call the clinic at any time with any questions, concerns, or complaints.     Time Total: 30 min   Visit consisted of counseling and education dealing with the complex and emotionally intense issues of symptom management and palliative care in the setting of serious and potentially life-threatening illness.Greater than 50%  of this time was spent counseling and coordinating care related to the above assessment and plan.  Willette Alma, AGPCNP-BC  Palliative Medicine Team/[facility]

## 2022-09-01 ENCOUNTER — Other Ambulatory Visit: Payer: Self-pay

## 2022-09-01 ENCOUNTER — Inpatient Hospital Stay (HOSPITAL_COMMUNITY): Payer: Medicare HMO

## 2022-09-01 ENCOUNTER — Ambulatory Visit
Admit: 2022-09-01 | Discharge: 2022-09-01 | Disposition: A | Discharge status: Home / Self Care | Payer: Medicare HMO | Attending: Radiation Oncology | Admitting: Radiation Oncology

## 2022-09-01 ENCOUNTER — Ambulatory Visit: Payer: Medicare HMO

## 2022-09-01 ENCOUNTER — Encounter (HOSPITAL_COMMUNITY): Payer: Self-pay | Admitting: Internal Medicine

## 2022-09-01 VITALS — BP 138/65 | HR 57 | Temp 97.7°F | Resp 14

## 2022-09-01 DIAGNOSIS — I639 Cerebral infarction, unspecified: Secondary | ICD-10-CM | POA: Diagnosis not present

## 2022-09-01 DIAGNOSIS — R531 Weakness: Secondary | ICD-10-CM | POA: Diagnosis not present

## 2022-09-01 DIAGNOSIS — G9520 Unspecified cord compression: Secondary | ICD-10-CM

## 2022-09-01 DIAGNOSIS — R29898 Other symptoms and signs involving the musculoskeletal system: Secondary | ICD-10-CM | POA: Diagnosis not present

## 2022-09-01 DIAGNOSIS — C3491 Malignant neoplasm of unspecified part of right bronchus or lung: Secondary | ICD-10-CM | POA: Diagnosis not present

## 2022-09-01 DIAGNOSIS — C7951 Secondary malignant neoplasm of bone: Secondary | ICD-10-CM

## 2022-09-01 DIAGNOSIS — E871 Hypo-osmolality and hyponatremia: Secondary | ICD-10-CM | POA: Diagnosis not present

## 2022-09-01 DIAGNOSIS — Z87891 Personal history of nicotine dependence: Secondary | ICD-10-CM | POA: Diagnosis not present

## 2022-09-01 LAB — BASIC METABOLIC PANEL
Anion gap: 8 (ref 5–15)
BUN: 9 mg/dL (ref 8–23)
CO2: 23 mmol/L (ref 22–32)
Calcium: 7.8 mg/dL — ABNORMAL LOW (ref 8.9–10.3)
Chloride: 95 mmol/L — ABNORMAL LOW (ref 98–111)
Creatinine, Ser: 0.64 mg/dL (ref 0.44–1.00)
GFR, Estimated: >60 mL/min (ref >60)
Glucose, Bld: 82 mg/dL (ref 70–99)
Potassium: 4.1 mmol/L (ref 3.5–5.1)
Sodium: 126 mmol/L — ABNORMAL LOW (ref 135–145)

## 2022-09-01 LAB — RAD ONC ARIA SESSION SUMMARY
Course Elapsed Days: 0
Plan Fractions Treated to Date: 1
Plan Prescribed Dose Per Fraction: 4 Gy
Plan Total Fractions Prescribed: 1
Plan Total Prescribed Dose: 4 Gy
Reference Point Dosage Given to Date: 4 Gy
Reference Point Session Dosage Given: 4 Gy
Session Number: 1

## 2022-09-01 LAB — GLUCOSE, CAPILLARY
Glucose-Capillary: 92 mg/dL (ref 70–99)
Glucose-Capillary: 125 mg/dL — ABNORMAL HIGH (ref 70–99)
Glucose-Capillary: 93 mg/dL (ref 70–99)
Glucose-Capillary: 122 mg/dL — ABNORMAL HIGH (ref 70–99)

## 2022-09-01 LAB — SODIUM: Sodium: 127 mmol/L — ABNORMAL LOW (ref 135–145)

## 2022-09-01 MED ORDER — LORAZEPAM 1 MG PO TABS
1.0000 mg | ORAL_TABLET | Freq: Once | ORAL | Status: AC | PRN
  Filled 2022-09-01: qty 1

## 2022-09-01 MED ORDER — OXYCODONE HCL 5 MG PO TABS
10.0000 mg | ORAL_TABLET | Freq: Once | ORAL | Status: AC
  Administered 2022-09-01: 10 mg via ORAL
  Filled 2022-09-01: qty 2

## 2022-09-01 MED ORDER — DEXAMETHASONE 4 MG PO TABS
6.0000 mg | ORAL_TABLET | Freq: Three times a day (TID) | ORAL | Status: DC
  Administered 2022-09-02 – 2022-09-11 (×27): 6 mg via ORAL
  Filled 2022-09-01 (×27): qty 1

## 2022-09-01 MED ORDER — LORAZEPAM 1 MG PO TABS
1.0000 mg | ORAL_TABLET | Freq: Once | ORAL | Status: AC | PRN
  Administered 2022-09-01: 1 mg via ORAL

## 2022-09-01 MED ORDER — DEXAMETHASONE SODIUM PHOSPHATE 10 MG/ML IJ SOLN
8.0000 mg | Freq: Once | INTRAMUSCULAR | Status: AC
  Administered 2022-09-01: 8 mg via INTRAVENOUS
  Filled 2022-09-01: qty 1

## 2022-09-01 MED ORDER — PANTOPRAZOLE SODIUM 40 MG PO TBEC
40.0000 mg | DELAYED_RELEASE_TABLET | Freq: Every day | ORAL | Status: DC
  Administered 2022-09-01 – 2022-09-12 (×12): 40 mg via ORAL
  Filled 2022-09-01 (×12): qty 1

## 2022-09-01 NOTE — Progress Notes (Signed)
PROGRESS NOTE    Kristi Jennings  ZOX:[id number] DOB: 1946/05/03 DOA: 08/30/2022 PCP: [name], NP   Brief Narrative: Kristi Jennings is a 77 y.o. 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 Patient presented secondary to progressive weakness and fatigue with associated sodium of 123, presumed secondary to her oral chemotherapy and dehydration. Patient started on IV fluids and CT imaging concerning for new metastasis. MRI survey pending.   Assessment and Plan:  Symptomatic hyponatremia Presumed secondary to hyponatremia with some improvement after IV fluids. Patient also with metastatic cancer. SIADH would be a possible concern. TSH normal. Urine sodium somewhat high for dehydration; urine osmolality not obtained. Sodium remains stable. Repeat urine sodium/osmolality are still not available -Repeat sodium checks -Follow-up on repeat urine sodium and osmolality  Bilateral lower extremity weakness Unclear etiology. Complicated by patient's history of multiple vertebral fractures. No red flag symptoms per patient. History of severe cervical cord compression in 2022. MRI spine is ordered and pending. MRI brain is also ordered and pending. CT head with concern for possible brain metastasis, which is ruled out on non-contrast MRI. MRI thoracic/lumbar spine with multiple pathologic compression fractures and severe spinal stenosis noted at L4-5. Weakness has now worsened. PT/OT recommending SNF. -MRI cervical spine -Neurosurgery consult  Chest pain Likely related to known metastatic bony lesions. Involvement of the manubrium.  -Analgesics PRN  Chronic pulmonary embolism -Continue Eliquis  Acute stroke Multiple punctate infarcts noted on MRI. Patient is on Eliquis for  pulmonary embolism. No evidence of hemorrhage. -Neurology consult -Transthoracic Echocardiogram   Metastatic non-small cell lung cancer Bone metastasis Right adrenal metastasis Patient follows with Dr. [name]. Recently on oral chemotherapy which was held secondary to hyponatremia and dehydration. -Message sent to primary oncologist for follow-up inpatient  Multiple vertebral compression fractures Noted on prior imaging. Concern would be pathologic fractures from metastasis.  Ascending aortic aneurysm Noted. Blood pressure mostly normotensive.  Prediabetes Noted. Most recent hemoglobin A1C is 5.2%, however.  Primary hypertension -Continue home atenolol  Polymyalgia rheumatica -Continue home gabapentin  Cancer related pain Chronic pain -Continue home MS Contin and dilaudid -Miralax and Senokot-S  Underweight Severe malnutrition Secondary to chronic illness. Estimated body mass index is 15.17 kg/m as calculated from the following:   Height as of this encounter: 5\' 6"  (1.676 m).   Weight as of this encounter: 42.6 kg.  Pressure injury Mid coccyx. Present on admission.   DVT prophylaxis: Eliquis Code Status:   Code Status: Full Code Family Communication: Husband at bedside Disposition Plan: Discharge home likely in 2-3 days pending workup for weakness and SNF bed   Consultants:  Neurosurgery Neurology Medical oncology  Procedures:  None  Antimicrobials: None    Subjective: Patient reports being scared secondary to not knowing why she is going through what she is going through at this time.  Objective: BP (!) 141/73 (BP Location: Left Arm)   Pulse (!) 58   Temp 98.9 F (37.2 C) (Oral)   Resp 19   Ht 5\' 6"  (1.676 m)   Wt 42.6 kg   SpO2 96%   BMI 15.17 kg/m   Examination:  General exam: Appears calm and comfortable Respiratory system: Clear to auscultation. Respiratory effort normal. Cardiovascular system: S1 & S2 heard, RRR. No murmurs, rubs,  gallops or clicks. Gastrointestinal system: Abdomen is nondistended, soft and nontender.  No organomegaly or masses felt. Normal bowel sounds heard. Central nervous system: Alert and oriented. Upper extremity strength intact. BLE strength of 0-1. Equal 3+ patellar reflexes Musculoskeletal: No edema. No calf tenderness Skin: No cyanosis. No rashes    Data Reviewed: I have personally reviewed following labs and imaging studies  CBC Lab Results  Component Value Date   WBC 8.9 08/30/2022   RBC 3.55 (L) 08/30/2022   HGB 10.4 (L) 08/30/2022   HCT 31.4 (L) 08/30/2022   MCV 88.5 08/30/2022   MCH 29.3 08/30/2022   PLT 185 08/30/2022   MCHC 33.1 08/30/2022   RDW 13.2 08/30/2022   LYMPHSABS 1.1 08/30/2022   MONOABS 1.4 (H) 08/30/2022   EOSABS 0.1 08/30/2022   BASOSABS 0.0 08/30/2022     Last metabolic panel Lab Results  Component Value Date   NA 126 (L) 09/01/2022   K 4.1 09/01/2022   CL 95 (L) 09/01/2022   CO2 23 09/01/2022   BUN 9 09/01/2022   CREATININE 0.64 09/01/2022   GLUCOSE 82 09/01/2022   GFRNONAA >60 09/01/2022   CALCIUM 7.8 (L) 09/01/2022   PROT 6.4 (L) 08/30/2022   ALBUMIN 3.3 (L) 08/30/2022   BILITOT 0.7 08/30/2022   ALKPHOS 56 08/30/2022   AST 31 08/30/2022   ALT 16 08/30/2022   ANIONGAP 8 09/01/2022    GFR: Estimated Creatinine Clearance: 40.9 mL/min (by C-G formula based on SCr of 0.64 mg/dL).  No results found for this or any previous visit (from the past 240 hour(s)).    Radiology Studies: MR LUMBAR SPINE WO CONTRAST  Result Date: 08/31/2022 CLINICAL DATA:  Initial evaluation for metastatic disease. EXAM: MRI LUMBAR SPINE WITHOUT CONTRAST TECHNIQUE: Multiplanar, multisequence MR imaging of the lumbar spine was performed. No intravenous contrast was administered. COMPARISON:  Prior CT from earlier the same day. FINDINGS: Segmentation: Standard. Lowest well-formed disc space labeled the L5-S1 level. Alignment: Mild exaggeration of the normal lumbar  lordosis. Grade 1 stepwise degenerative retrolisthesis of L1 on L2 through L4 on L5. Vertebrae: Compression deformity involving the T12 vertebral body with up to 70% height loss and trace 3 mm bony retropulsion. This is suspected to be pathologic in nature. No significant residual marrow edema. Additional mild benign/mechanical appearing compression deformity noted at the superior endplate of L1. Otherwise, vertebral body height maintained. Bone marrow signal intensity otherwise within normal limits. No other evidence for metastatic disease within the lumbar spine. Conus medullaris and cauda equina: Conus extends to the L2 level. Conus and cauda equina appear normal. No visible epidural involvement of tumor. Paraspinal and other soft tissues: Paraspinous soft tissues demonstrate no acute finding. No paraspinous soft tissue mass or collection. Disc levels: L1-2: Degenerative intervertebral disc space narrowing with diffuse disc bulge and disc desiccation. Mild facet hypertrophy. No more than mild spinal stenosis. Foramina remain patent. L2-3: Degenerative intervertebral disc space narrowing with diffuse disc bulge and disc desiccation. Mild facet hypertrophy. Resultant mild canal with bilateral lateral recess stenosis, worse on the right. Foramina remain patent. L3-4: Disc bulge with disc desiccation. Disc bulging asymmetric to the right. Mild to moderate facet hypertrophy. Resultant mild-to-moderate canal with bilateral subarticular stenosis. Moderate bilateral L3 foraminal narrowing. L4-5: Degenerative intervertebral disc space narrowing with diffuse disc bulge and reactive endplate spurring. Moderate facet and ligament flavum hypertrophy. Resultant moderate to severe canal with bilateral subarticular stenosis. Severe bilateral L4 foraminal narrowing. L5-S1: Mild disc bulge with disc desiccation. Moderate facet hypertrophy. Resultant mild narrowing of the lateral recesses bilaterally. Central canal remains patent.  No significant foraminal stenosis. IMPRESSION: 1. Compression deformity involving the T12 vertebral body with up to 70% height loss and trace 3 mm bony retropulsion. This is suspected to be pathologic in nature. 2. No other evidence for metastatic disease within the lumbar spine. 3. Multilevel lumbar spondylosis with resultant diffuse spinal stenosis, moderate to severe in nature at L4-5. 4. Moderate bilateral L3 and severe bilateral L4 foraminal stenosis related to disc bulge, reactive endplate spurring, and facet hypertrophy. Electronically Signed   By: Rise Mu M.D.   On: 08/31/2022 19:49   MR THORACIC SPINE WO CONTRAST  Result Date: 08/31/2022 CLINICAL DATA:  Initial evaluation for metastatic disease. EXAM: MRI THORACIC SPINE WITHOUT CONTRAST TECHNIQUE: Multiplanar, multisequence MR imaging of the thoracic spine was performed. No intravenous contrast was administered. COMPARISON:  Prior CT from earlier the same day. FINDINGS: Alignment:  Examination degraded by motion artifact. Straightening of the normal thoracic kyphosis. No significant listhesis. Vertebrae: Abnormal signal abnormality involving the T2 and T3 vertebral bodies, consistent with osseous metastatic disease. Abnormal paravertebral soft tissue along the left aspect of the vertebral column at the level of T3, likely reflecting metastatic disease. Paravertebral component measures approximately 3.5 x 1.5 x 5.1 cm (series 28). Additionally, there is abnormal soft tissue density filling the spinal canal at the levels of T2 through T4 (series 27, image 10), consistent with epidural extension of tumor. The underlying native cord is somewhat difficult to visualize at this level on this motion degraded exam, with possible intramedullary involvement not excluded. The cord itself appears to be somewhat expanded. No overt cord signal changes seen on this limited exam. Overall, degree of intracanalicular involvement measures approximately 4.3 cm  in craniocaudad dimension. There is moderate to severe spinal stenosis at this level. Tumor seen extending into and largely obliterating the T2-3 and T3-4 neural foramina bilaterally. Associated mild pathologic compression fractures at the superior endplates of T2 and T3. Additional compression deformity at the superior endplate of T4 noted, also potentially pathologic. Additional compression deformities involving the T8 and T12 vertebral bodies, favored to be pathologic in nature. Height loss is maximal at T12 measuring up to approximately 70% with no more than trace 2-3 mm bony retropulsion. No significant stenosis at these levels. Elsewhere, bone marrow signal intensity is within normal limits. Vertebral body height otherwise maintained. Cord: Intracanalicular tumor involvement at T2 through T4 as above. Again, no obvious cord signal changes on this limited exam. Otherwise normal signal and morphology. No other epidural involvement of malignancy identified. Paraspinal and other soft tissues: Paravertebral tumor at the levels of T3-4 as above. This is in close proximity to patient's known right upper lobe mass. Small layering bilateral pleural effusions. No other acute finding. Disc levels: Underlying mild for age multilevel degenerative spondylosis with mild noncompressive disc bulging at T7-8 and T8-9. No other significant spinal stenosis. Moderate bilateral foraminal narrowing noted at T8-9 and T9-10. No other significant stenosis. IMPRESSION: 1. 3.5 x 1.5 x 5.1 cm soft tissue involving the left paravertebral soft tissues at T2 through T4, concerning for metastatic disease. Abnormal signal intensity within the adjacent T2 and T3 vertebral bodies, consistent with osseous involvement. Associated mild pathologic compression fractures at these levels. Additional mild compression deformity at T4, also likely involved. 2. Associated intracanalicular extension of tumor at the levels of T2 through T4 with resultant  moderate to severe spinal stenosis. The underlying native cord is difficult to visualize on this motion degraded exam, with possible intramedullary involvement not excluded. No overt cord  signal changes seen on this limited exam. 3. Additional compression deformities involving the T8 and T12 vertebral bodies, also likely pathologic. 4. Known right upper lobe mass with small layering bilateral pleural effusions, better evaluated on prior chest CT. 5. Underlying mild for age thoracic spondylosis without other high-grade spinal stenosis. Moderate bilateral foraminal narrowing at T8-9 and T9-10. Electronically Signed   By: Rise Mu M.D.   On: 08/31/2022 19:41   MR BRAIN WO CONTRAST  Result Date: 08/31/2022 CLINICAL DATA:  Initial evaluation for metastatic disease. EXAM: MRI HEAD WITHOUT CONTRAST TECHNIQUE: Multiplanar, multiecho pulse sequences of the brain and surrounding structures were obtained without intravenous contrast. COMPARISON:  CT from 08/30/2022 as well as prior MRI from 06/28/2022. FINDINGS: Brain: Moderate to advance cerebral atrophy. Moderately advanced chronic microvascular ischemic disease, similar to prior. Few scattered foci of punctate restricted diffusion are seen involving the bilateral cerebral hemispheres, consistent with small acute ischemic infarcts (series 5, images 41, 40, 38, 32, 29). No associated hemorrhage or mass effect. No other evidence for acute or subacute ischemia. Gray-white matter distinction otherwise maintained. No areas of chronic cortical infarction. No acute or chronic intracranial blood products. No mass lesion or evidence for intracranial metastatic disease on this noncontrast examination. No mass effect or midline shift. No hydrocephalus or extra-axial fluid collection. Pituitary gland and suprasellar region within normal limits. Vascular: Irregular flow void within the hypoplastic right vertebral artery, which could be related to slow flow and/or  occlusion. Major intracranial vascular flow voids are otherwise maintained. Skull and upper cervical spine: Craniocervical junction within normal limits. Bone marrow signal intensity normal. No focal marrow replacing lesion. No scalp soft tissue abnormality. Sinuses/Orbits: Globes and orbital soft tissues within normal limits. Scattered mucosal thickening present about the sphenoethmoidal and maxillary sinuses, most pronounced within the right sphenoid sinus. No mastoid effusion. Other: None. IMPRESSION: 1. Few scattered punctate acute ischemic infarcts involving the bilateral cerebral hemispheres. No associated hemorrhage or mass effect. A central thromboembolic etiology is suspected. 2. No evidence for intracranial metastatic disease on this noncontrast examination. 3. Irregular flow void within the hypoplastic right vertebral artery, which could be related to slow flow and/or occlusion. 4. Underlying moderate to advanced cerebral atrophy with chronic microvascular ischemic disease. Electronically Signed   By: Rise Mu M.D.   On: 08/31/2022 19:25   CT CHEST W CONTRAST  Result Date: 08/31/2022 CLINICAL DATA:  Rib fracture suspected.  Non-small cell lung cancer. EXAM: CT CHEST WITH CONTRAST TECHNIQUE: Multidetector CT imaging of the chest was performed during intravenous contrast administration. RADIATION DOSE REDUCTION: This exam was performed according to the departmental dose-optimization program which includes automated exposure control, adjustment of the mA and/or kV according to patient size and/or use of iterative reconstruction technique. CONTRAST:  75mL OMNIPAQUE IOHEXOL 300 MG/ML  SOLN COMPARISON:  Chest x-ray same day.  CT chest 07/04/2022. FINDINGS: Cardiovascular: The ascending aorta is dilated measuring 4.0 x 4.3 cm, unchanged. There is no evidence for dissection. There are atherosclerotic calcifications of the aorta. The heart is normal in size. There is no pericardial effusion. There  is no large central pulmonary embolism. Mediastinum/Nodes: No enlarged mediastinal, hilar, or axillary lymph nodes. Thyroid gland, trachea, and esophagus demonstrate no significant findings. Lungs/Pleura: There are new small bilateral pleural effusions. Right upper lobe/apical cavitary mass appears unchanged from the prior examination extending to the hilum. Right upper lobe bronchus remains patent. Scarring in the right middle lobe is stable. There is no new focal lung infiltrate or pneumothorax. Upper  Abdomen: No acute abnormality. Musculoskeletal: Osseous metastatic disease again seen most significant at T3, T8 and T12. T4, T8 and T12 compression deformities are stable. There is increasing anterior and left paramediastinal low-attenuation density at the L3-L4 level. This measures 3.6 x 1.8 x 3.4 cm on axial image 5/30 and sagittal image 7/73 and has increased compared to prior study. Right anterior L4 rib metastatic lesion is unchanged. IMPRESSION: 1. New small bilateral pleural effusions. 2. Stable right upper lobe/apical cavitary mass extending to the hilum. 3. Stable osseous metastatic disease. 4. Increasing anterior left paramediastinal low-attenuation density at the L3-L4 level. Findings may represent enlarging metastatic lesion or abscess. Consider further evaluation with MRI. Aortic Atherosclerosis (ICD10-I70.0). Electronically Signed   By: Darliss Cheney M.D.   On: 08/31/2022 18:44   CT HEAD WO CONTRAST ( )  Result Date: 08/30/2022 CLINICAL DATA:  Sudden numbness and tingling in the legs and feet. 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 Metastatic lung cancer. EXAM: CT HEAD WITHOUT CONTRAST TECHNIQUE: Contiguous axial images were obtained from the base of the skull through the vertex without intravenous contrast. RADIATION DOSE REDUCTION: This exam was performed according to the departmental dose-optimization program which includes automated exposure control, adjustment of the mA and/or kV according to  patient size and/or use of iterative reconstruction technique. COMPARISON:  MRI 06/28/2022 FINDINGS: Brain: No focal abnormality is seen affecting the brainstem or cerebellum. Cerebral hemispheres show atrophy and chronic small-vessel ischemic change throughout the white matter. No sign of acute infarction. Axial image 20 suggests a possible 3-4 mm slightly hyperdense focus in the subcortical white matter of the right frontal lobe. Nothing was seen in this location on the previous MRI. This could be interval development of a metastasis. Consider repeat MRI. No evidence of hemorrhage, hydrocephalus or extra-axial collection. Vascular: There is atherosclerotic calcification of the major vessels at the base of the brain. 5 mm aneurysm shown by MRI cannot be specifically seen by noncontrast CT. Skull: Normal Sinuses/Orbits: Clear/normal Other: None IMPRESSION: 1. Atrophy and chronic small-vessel ischemic change of the white matter. No sign of acute infarction. 2. Axial image 20 suggests a possible 3-4 mm slightly hyperdense focus in the subcortical white matter of the right frontal lobe. Nothing was seen in this location on the previous MRI. This could be interval development of a small metastasis. Consider repeat MRI. 3. 5 mm aneurysm shown by MRI cannot be specifically seen by noncontrast CT. Electronically Signed   By: Paulina Fusi M.D.   On: 08/30/2022 16:04   DG Lumbar Spine Complete  Result Date: 08/30/2022 CLINICAL DATA:  Bilateral lower extremity weakness several days. Increasing weakness/dehydration recently. Stage IV lung cancer. EXAM: LUMBAR SPINE - COMPLETE 4+ VIEW COMPARISON:  CT 07/04/2022 FINDINGS: Vertebral body alignment is normal. Evidence of patient's known severe compression fractures of T8 and T12 thought to be pathologic due to underlying metastatic disease from known lung cancer. No new compression fractures. Moderate spondylosis of the lumbar spine to include moderate facet arthropathy over  the lower lumbar spine. Moderate multilevel disc disease over the lumbar spine most prominent at the L2-3 and L4-5 levels. Mild degenerative change of the hips. Remainder of the exam is unchanged. IMPRESSION: 1. No acute findings. 2. Moderate spondylosis of the lumbar spine with moderate multilevel disc disease. 3. Known severe compression fractures of T8 and T12 thought to be pathologic due to known underlying metastatic lung cancer Electronically Signed   By: Elberta Fortis M.D.   On: 08/30/2022 14:51   DG Chest 2  View  Result Date: 08/30/2022 CLINICAL DATA:  Weakness EXAM: CHEST - 2 VIEW COMPARISON:  CXR 01/05/21 FINDINGS: No pleural effusion. No pneumothorax. Increased asymmetric elevation of the right hemidiaphragm compared to 01/05/2021 likely secondary to interval volume loss. Unchanged appearance of the right lung apex compared to recent CT chest dated 07/04/2022. Normal cardiac and mediastinal contours. No radiographically apparent displaced rib fractures. Visualized upper abdomen is unremarkable. Vertebral body heights are unchanged compared to recent prior CT chest abdomen and pelvis dated 07/04/2022 with compression deformities in the mid and lower thoracic spine IMPRESSION: Persistent mass like thickening at the right lung apex is unchanged compared to 07/04/22 CT chest/abdomen/pelvis. No new focal airspace opacity. Electronically Signed   By: Lorenza Cambridge M.D.   On: 08/30/2022 14:37      LOS: 2 days    Jacquelin Hawking, MD [facility] 09/01/2022, 10:57 AM   If 7PM-7AM, please contact night-coverage [email or web address]

## 2022-09-01 NOTE — Consult Note (Signed)
Reason for Consult: Metastatic lung cancer to thoracic spine with paraparesis Referring Physician: Medical service  Kristi Jennings is an 77 y.o. female.   HPI:  Unfortunate 77 year old female seen in neurosurgical consultation regarding metastatic disease to the upper thoracic region with progressive paraparesis of the lower extremities.  She is on Eliquis for previous DVT PE.  She has had stage IV lung cancer for about 2 years.  Palliative care is involved.  Over the last 10 days or so she has developed progressive weakness of the lower extremities with some numbness and tingling.  She began using a walker sometime last week.  Over the last couple of days she fell in the bathroom and has had trouble walking since that time.  She was admitted yesterday with fairly significant weakness in the legs.  She was mildly weaker in the legs today so medicine called [place] for an evaluation.  He has had some upper thoracic back pain for some time.  It is mild to moderate and aching in character.  She denies bowel or bladder issues at this point.  She has a pure wick.  MRI of the brain showed small punctate infarcts and neurology is also in the room with [place] to discuss those findings.  Past Medical History:  Diagnosis Date   Borderline diabetes mellitus    Bronchogenic cancer of right lung    Dyslipidemia    History of radiation therapy    right chest 12/21/2020-12/24/2020  Dr Antony Blackbird   History of radiation therapy    right lung 06/07/2021-06/17/2021  Dr Antony Blackbird   Hypertension    Polymyalgia rheumatica     History reviewed. No pertinent surgical history.  Allergies  Allergen Reactions   Shellfish-Derived Products Hives, Itching and Rash   Augmentin [Amoxicillin-Pot Clavulanate] Nausea And Vomiting    Social History   Tobacco Use   Smoking status: Former    Packs/day: 1.00    Years: 43.00    Additional pack years: 0.00    Total pack years: 43.00    Types: Cigarettes    Quit date: 11/23/2008     Years since quitting: 13.7   Smokeless tobacco: Never   Tobacco comments:    quit 2010  Substance Use Topics   Alcohol use: Not Currently    Family History  Problem Relation Age of Onset   Heart attack Mother    Heart disease Mother    Colon cancer Father    ALS Brother      Review of Systems  Positive ROS: As above  All other systems have been reviewed and were otherwise negative with the exception of those mentioned in the HPI and as above.  Objective: Vital signs in last 24 hours: Temp:  [97.8 F (36.6 C)-98.9 F (37.2 C)] 97.8 F (36.6 C) (04/12 1316) Pulse Rate:  [56-64] 56 (04/12 1316) Resp:  [16-19] 18 (04/12 1316) BP: (106-153)/(58-80) 106/58 (04/12 1316) SpO2:  [96 %-99 %] 99 % (04/12 1316)  General Appearance: Alert, cooperative, no distress, appears stated age Head: Normocephalic, without obvious abnormality, atraumatic Eyes: PERRL, conjunctiva/corneas clear, EOM's intact    Neck: Supple, symmetrical, trachea midline Lungs: respirations unlabored Heart: Regular rate and rhythm  NEUROLOGIC:   Mental status: A&O x4, no aphasia, good attention span, Memory and fund of knowledge are somewhat diminished Motor Exam - grossly normal, normal tone and bulk in the upper extremities, she has some increased tone of the lower extremities with 1-2 out of 5 strength, with better  continuous movement than volitional movement Sensory Exam -has gross sensation in the lower extremities  Coordination - grossly normal upper extremities Gait -unable to test Balance -unable to test Cranial Nerves: I: smell Not tested  II: visual acuity  OS: na    OD: na  II: visual fields Full to confrontation  II: pupils Equal, round, reactive to light  III,VII: ptosis None  III,IV,VI: extraocular muscles  Full ROM  V: mastication Normal  V: facial light touch sensation  Normal  V,VII: corneal reflex  Present  VII: facial muscle function - upper  Normal  VII: facial muscle function -  lower Normal  VIII: hearing Not tested  IX: soft palate elevation  Normal  IX,X: gag reflex Present  XI: trapezius strength  5/5  XI: sternocleidomastoid strength 5/5  XI: neck flexion strength  5/5  XII: tongue strength  Normal    Data Review Lab Results  Component Value Date   WBC 8.9 08/30/2022   HGB 10.4 (L) 08/30/2022   HCT 31.4 (L) 08/30/2022   MCV 88.5 08/30/2022   PLT 185 08/30/2022   Lab Results  Component Value Date   NA 126 (L) 09/01/2022   K 4.1 09/01/2022   CL 95 (L) 09/01/2022   CO2 23 09/01/2022   BUN 9 09/01/2022   CREATININE 0.64 09/01/2022   GLUCOSE 82 09/01/2022   Lab Results  Component Value Date   INR 1.3 (H) 08/30/2022    Radiology: MR LUMBAR SPINE WO CONTRAST  Result Date: 08/31/2022 CLINICAL DATA:  Initial evaluation for metastatic disease. EXAM: MRI LUMBAR SPINE WITHOUT CONTRAST TECHNIQUE: Multiplanar, multisequence MR imaging of the lumbar spine was performed. No intravenous contrast was administered. COMPARISON:  Prior CT from earlier the same day. FINDINGS: Segmentation: Standard. Lowest well-formed disc space labeled the L5-S1 level. Alignment: Mild exaggeration of the normal lumbar lordosis. Grade 1 stepwise degenerative retrolisthesis of L1 on L2 through L4 on L5. Vertebrae: Compression deformity involving the T12 vertebral body with up to 70% height loss and trace 3 mm bony retropulsion. This is suspected to be pathologic in nature. No significant residual marrow edema. Additional mild benign/mechanical appearing compression deformity noted at the superior endplate of L1. Otherwise, vertebral body height maintained. Bone marrow signal intensity otherwise within normal limits. No other evidence for metastatic disease within the lumbar spine. Conus medullaris and cauda equina: Conus extends to the L2 level. Conus and cauda equina appear normal. No visible epidural involvement of tumor. Paraspinal and other soft tissues: Paraspinous soft tissues  demonstrate no acute finding. No paraspinous soft tissue mass or collection. Disc levels: L1-2: Degenerative intervertebral disc space narrowing with diffuse disc bulge and disc desiccation. Mild facet hypertrophy. No more than mild spinal stenosis. Foramina remain patent. L2-3: Degenerative intervertebral disc space narrowing with diffuse disc bulge and disc desiccation. Mild facet hypertrophy. Resultant mild canal with bilateral lateral recess stenosis, worse on the right. Foramina remain patent. L3-4: Disc bulge with disc desiccation. Disc bulging asymmetric to the right. Mild to moderate facet hypertrophy. Resultant mild-to-moderate canal with bilateral subarticular stenosis. Moderate bilateral L3 foraminal narrowing. L4-5: Degenerative intervertebral disc space narrowing with diffuse disc bulge and reactive endplate spurring. Moderate facet and ligament flavum hypertrophy. Resultant moderate to severe canal with bilateral subarticular stenosis. Severe bilateral L4 foraminal narrowing. L5-S1: Mild disc bulge with disc desiccation. Moderate facet hypertrophy. Resultant mild narrowing of the lateral recesses bilaterally. Central canal remains patent. No significant foraminal stenosis. IMPRESSION: 1. Compression deformity involving the T12 vertebral body with  up to 70% height loss and trace 3 mm bony retropulsion. This is suspected to be pathologic in nature. 2. No other evidence for metastatic disease within the lumbar spine. 3. Multilevel lumbar spondylosis with resultant diffuse spinal stenosis, moderate to severe in nature at L4-5. 4. Moderate bilateral L3 and severe bilateral L4 foraminal stenosis related to disc bulge, reactive endplate spurring, and facet hypertrophy. Electronically Signed   By: Rise Mu M.D.   On: 08/31/2022 19:49   MR THORACIC SPINE WO CONTRAST  Result Date: 08/31/2022 CLINICAL DATA:  Initial evaluation for metastatic disease. EXAM: MRI THORACIC SPINE WITHOUT CONTRAST  TECHNIQUE: Multiplanar, multisequence MR imaging of the thoracic spine was performed. No intravenous contrast was administered. COMPARISON:  Prior CT from earlier the same day. FINDINGS: Alignment:  Examination degraded by motion artifact. Straightening of the normal thoracic kyphosis. No significant listhesis. Vertebrae: Abnormal signal abnormality involving the T2 and T3 vertebral bodies, consistent with osseous metastatic disease. Abnormal paravertebral soft tissue along the left aspect of the vertebral column at the level of T3, likely reflecting metastatic disease. Paravertebral component measures approximately 3.5 x 1.5 x 5.1 cm (series 28). Additionally, there is abnormal soft tissue density filling the spinal canal at the levels of T2 through T4 (series 27, image 10), consistent with epidural extension of tumor. The underlying native cord is somewhat difficult to visualize at this level on this motion degraded exam, with possible intramedullary involvement not excluded. The cord itself appears to be somewhat expanded. No overt cord signal changes seen on this limited exam. Overall, degree of intracanalicular involvement measures approximately 4.3 cm in craniocaudad dimension. There is moderate to severe spinal stenosis at this level. Tumor seen extending into and largely obliterating the T2-3 and T3-4 neural foramina bilaterally. Associated mild pathologic compression fractures at the superior endplates of T2 and T3. Additional compression deformity at the superior endplate of T4 noted, also potentially pathologic. Additional compression deformities involving the T8 and T12 vertebral bodies, favored to be pathologic in nature. Height loss is maximal at T12 measuring up to approximately 70% with no more than trace 2-3 mm bony retropulsion. No significant stenosis at these levels. Elsewhere, bone marrow signal intensity is within normal limits. Vertebral body height otherwise maintained. Cord: Intracanalicular  tumor involvement at T2 through T4 as above. Again, no obvious cord signal changes on this limited exam. Otherwise normal signal and morphology. No other epidural involvement of malignancy identified. Paraspinal and other soft tissues: Paravertebral tumor at the levels of T3-4 as above. This is in close proximity to patient's known right upper lobe mass. Small layering bilateral pleural effusions. No other acute finding. Disc levels: Underlying mild for age multilevel degenerative spondylosis with mild noncompressive disc bulging at T7-8 and T8-9. No other significant spinal stenosis. Moderate bilateral foraminal narrowing noted at T8-9 and T9-10. No other significant stenosis. IMPRESSION: 1. 3.5 x 1.5 x 5.1 cm soft tissue involving the left paravertebral soft tissues at T2 through T4, concerning for metastatic disease. Abnormal signal intensity within the adjacent T2 and T3 vertebral bodies, consistent with osseous involvement. Associated mild pathologic compression fractures at these levels. Additional mild compression deformity at T4, also likely involved. 2. Associated intracanalicular extension of tumor at the levels of T2 through T4 with resultant moderate to severe spinal stenosis. The underlying native cord is difficult to visualize on this motion degraded exam, with possible intramedullary involvement not excluded. No overt cord signal changes seen on this limited exam. 3. Additional compression deformities involving the T8  and T12 vertebral bodies, also likely pathologic. 4. Known right upper lobe mass with small layering bilateral pleural effusions, better evaluated on prior chest CT. 5. Underlying mild for age thoracic spondylosis without other high-grade spinal stenosis. Moderate bilateral foraminal narrowing at T8-9 and T9-10. Electronically Signed   By: Rise Mu M.D.   On: 08/31/2022 19:41   MR BRAIN WO CONTRAST  Result Date: 08/31/2022 CLINICAL DATA:  Initial evaluation for metastatic  disease. EXAM: MRI HEAD WITHOUT CONTRAST TECHNIQUE: Multiplanar, multiecho pulse sequences of the brain and surrounding structures were obtained without intravenous contrast. COMPARISON:  CT from 08/30/2022 as well as prior MRI from 06/28/2022. FINDINGS: Brain: Moderate to advance cerebral atrophy. Moderately advanced chronic microvascular ischemic disease, similar to prior. Few scattered foci of punctate restricted diffusion are seen involving the bilateral cerebral hemispheres, consistent with small acute ischemic infarcts (series 5, images 41, 40, 38, 32, 29). No associated hemorrhage or mass effect. No other evidence for acute or subacute ischemia. Gray-white matter distinction otherwise maintained. No areas of chronic cortical infarction. No acute or chronic intracranial blood products. No mass lesion or evidence for intracranial metastatic disease on this noncontrast examination. No mass effect or midline shift. No hydrocephalus or extra-axial fluid collection. Pituitary gland and suprasellar region within normal limits. Vascular: Irregular flow void within the hypoplastic right vertebral artery, which could be related to slow flow and/or occlusion. Major intracranial vascular flow voids are otherwise maintained. Skull and upper cervical spine: Craniocervical junction within normal limits. Bone marrow signal intensity normal. No focal marrow replacing lesion. No scalp soft tissue abnormality. Sinuses/Orbits: Globes and orbital soft tissues within normal limits. Scattered mucosal thickening present about the sphenoethmoidal and maxillary sinuses, most pronounced within the right sphenoid sinus. No mastoid effusion. Other: None. IMPRESSION: 1. Few scattered punctate acute ischemic infarcts involving the bilateral cerebral hemispheres. No associated hemorrhage or mass effect. A central thromboembolic etiology is suspected. 2. No evidence for intracranial metastatic disease on this noncontrast examination. 3.  Irregular flow void within the hypoplastic right vertebral artery, which could be related to slow flow and/or occlusion. 4. Underlying moderate to advanced cerebral atrophy with chronic microvascular ischemic disease. Electronically Signed   By: Rise Mu M.D.   On: 08/31/2022 19:25   CT CHEST W CONTRAST  Result Date: 08/31/2022 CLINICAL DATA:  Rib fracture suspected.  Non-small cell lung cancer. EXAM: CT CHEST WITH CONTRAST TECHNIQUE: Multidetector CT imaging of the chest was performed during intravenous contrast administration. RADIATION DOSE REDUCTION: This exam was performed according to the departmental dose-optimization program which includes automated exposure control, adjustment of the mA and/or kV according to patient size and/or use of iterative reconstruction technique. CONTRAST:  6mL OMNIPAQUE IOHEXOL 300 MG/ML  SOLN COMPARISON:  Chest x-ray same day.  CT chest 07/04/2022. FINDINGS: Cardiovascular: The ascending aorta is dilated measuring 4.0 x 4.3 cm, unchanged. There is no evidence for dissection. There are atherosclerotic calcifications of the aorta. The heart is normal in size. There is no pericardial effusion. There is no large central pulmonary embolism. Mediastinum/Nodes: No enlarged mediastinal, hilar, or axillary lymph nodes. Thyroid gland, trachea, and esophagus demonstrate no significant findings. Lungs/Pleura: There are new small bilateral pleural effusions. Right upper lobe/apical cavitary mass appears unchanged from the prior examination extending to the hilum. Right upper lobe bronchus remains patent. Scarring in the right middle lobe is stable. There is no new focal lung infiltrate or pneumothorax. Upper Abdomen: No acute abnormality. Musculoskeletal: Osseous metastatic disease again seen most significant at T3,  T8 and T12. T4, T8 and T12 compression deformities are stable. There is increasing anterior and left paramediastinal low-attenuation density at the L3-L4 level.  This measures 3.6 x 1.8 x 3.4 cm on axial image 5/30 and sagittal image 7/73 and has increased compared to prior study. Right anterior L4 rib metastatic lesion is unchanged. IMPRESSION: 1. New small bilateral pleural effusions. 2. Stable right upper lobe/apical cavitary mass extending to the hilum. 3. Stable osseous metastatic disease. 4. Increasing anterior left paramediastinal low-attenuation density at the L3-L4 level. Findings may represent enlarging metastatic lesion or abscess. Consider further evaluation with MRI. Aortic Atherosclerosis (ICD10-I70.0). Electronically Signed   By: Darliss Cheney M.D.   On: 08/31/2022 18:44   CT HEAD WO CONTRAST ( )  Result Date: 08/30/2022 CLINICAL DATA:  Sudden numbness and tingling in the legs and feet. 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 Metastatic lung cancer. EXAM: CT HEAD WITHOUT CONTRAST TECHNIQUE: Contiguous axial images were obtained from the base of the skull through the vertex without intravenous contrast. RADIATION DOSE REDUCTION: This exam was performed according to the departmental dose-optimization program which includes automated exposure control, adjustment of the mA and/or kV according to patient size and/or use of iterative reconstruction technique. COMPARISON:  MRI 06/28/2022 FINDINGS: Brain: No focal abnormality is seen affecting the brainstem or cerebellum. Cerebral hemispheres show atrophy and chronic small-vessel ischemic change throughout the white matter. No sign of acute infarction. Axial image 20 suggests a possible 3-4 mm slightly hyperdense focus in the subcortical white matter of the right frontal lobe. Nothing was seen in this location on the previous MRI. This could be interval development of a metastasis. Consider repeat MRI. No evidence of hemorrhage, hydrocephalus or extra-axial collection. Vascular: There is atherosclerotic calcification of the major vessels at the base of the brain. 5 mm aneurysm shown by MRI cannot be specifically seen by  noncontrast CT. Skull: Normal Sinuses/Orbits: Clear/normal Other: None IMPRESSION: 1. Atrophy and chronic small-vessel ischemic change of the white matter. No sign of acute infarction. 2. Axial image 20 suggests a possible 3-4 mm slightly hyperdense focus in the subcortical white matter of the right frontal lobe. Nothing was seen in this location on the previous MRI. This could be interval development of a small metastasis. Consider repeat MRI. 3. 5 mm aneurysm shown by MRI cannot be specifically seen by noncontrast CT. Electronically Signed   By: Paulina Fusi M.D.   On: 08/30/2022 16:04   DG Lumbar Spine Complete  Result Date: 08/30/2022 CLINICAL DATA:  Bilateral lower extremity weakness several days. Increasing weakness/dehydration recently. Stage IV lung cancer. EXAM: LUMBAR SPINE - COMPLETE 4+ VIEW COMPARISON:  CT 07/04/2022 FINDINGS: Vertebral body alignment is normal. Evidence of patient's known severe compression fractures of T8 and T12 thought to be pathologic due to underlying metastatic disease from known lung cancer. No new compression fractures. Moderate spondylosis of the lumbar spine to include moderate facet arthropathy over the lower lumbar spine. Moderate multilevel disc disease over the lumbar spine most prominent at the L2-3 and L4-5 levels. Mild degenerative change of the hips. Remainder of the exam is unchanged. IMPRESSION: 1. No acute findings. 2. Moderate spondylosis of the lumbar spine with moderate multilevel disc disease. 3. Known severe compression fractures of T8 and T12 thought to be pathologic due to known underlying metastatic lung cancer Electronically Signed   By: Elberta Fortis M.D.   On: 08/30/2022 14:51   DG Chest 2 View  Result Date: 08/30/2022 CLINICAL DATA:  Weakness EXAM: CHEST - 2 VIEW  COMPARISON:  CXR 01/05/21 FINDINGS: No pleural effusion. No pneumothorax. Increased asymmetric elevation of the right hemidiaphragm compared to 01/05/2021 likely secondary to interval  volume loss. Unchanged appearance of the right lung apex compared to recent CT chest dated 07/04/2022. Normal cardiac and mediastinal contours. No radiographically apparent displaced rib fractures. Visualized upper abdomen is unremarkable. Vertebral body heights are unchanged compared to recent prior CT chest abdomen and pelvis dated 07/04/2022 with compression deformities in the mid and lower thoracic spine IMPRESSION: Persistent mass like thickening at the right lung apex is unchanged compared to 07/04/22 CT chest/abdomen/pelvis. No new focal airspace opacity. Electronically Signed   By: Lorenza Cambridge M.D.   On: 08/30/2022 14:37      Assessment/Plan: Estimated body mass index is 15.17 kg/m as calculated from the following:   Height as of this encounter: 5\' 6"  (1.676 m).   Weight as of this encounter: 42.6 kg.   Unfortunate 77 year old female with stage IV lung cancer with new metastasis to the T2-4 region with extension into the chest and into the epidural space with canal stenosis and cord compression and progressive paraparesis of the lower extremities.  She is on Eliquis for previous DVT PE.  She has had new brain infarcts.  Dr. Selina Cooley from neurology and myself spent considerable time in the room speaking with her and her husband.  Specifically we have talked about the utility of decompressive laminectomy in the hopes of improvement of lower extremity function.  Unfortunately she is on Eliquis, and while this could be partially reversed with Kcentra we are concerned about inducing a hypercoagulable state and increasing the risk of a larger brain infarct.  Neurology agrees.  We also worry that this would increase the risk of DVT PE.  Unfortunately, I do not believe that decompressive surgery offers much hope of regaining functional strength in the lower extremities.  It may improve movement but not likely improve her chance of functional ambulation.  Therefore I think she is likely to be  wheelchair-bound with or without surgery.  Therefore I think the risk-benefit ratio is not favorable and I think the risk of the surgery far outweighs the potential benefits.  Dr. Selina Cooley from neurology completely agrees.  Avoiding surgery means that she could stay on Eliquis and help prevent or lower the risk of further strokes.  Obviously this would also help decrease the risk of DVT PE which could be deadly.  While she is not a surgical candidate, we do feel that she is a candidate for palliative radiation therapy potentially.  Therefore we agree with MRI of cervical spine.  Obviously, further very frank discussions regarding goals of care, palliative care, and aggressive care are really important.  Dr. Selina Cooley and I started those conversations today.  Please call if we can be of further assistance   Tia Alert 09/01/2022 3:56 PM

## 2022-09-01 NOTE — Consult Note (Signed)
Neurology Consultation Reason for Consult: Stroke on MRI  Referring Physician: Dr. Caleb Popp  CC: weakness  History is obtained from: patient, husband at bedside and chart.   HPI: Kristi Jennings is a 77 y.o. female with a history of prediabetes, hyperlipidemia, hypertension, polymyalgia rheumatica, pulmonary emboli on eliquis, ascending aortic aneurysm, stage IV non-small cell lung cancer with bone and right adrenal metastasis currently on oral chemotherapy. Patient presented secondary to progressive weakness and fatigue with associated sodium of 123, presumed secondary to her oral chemotherapy and dehydration. Patient started on IV fluids and CT imaging concerning for new metastasis. MRI brain positive for scattered punctate acute ischemic infarcts. MRI T spine concerning for metastatic disease. Pending MRI C spine.   On exam, patient is unable to wiggle toes or move her feet at all. She is able to maintain legs in a knee-bent position once assisted to the position. Strong in both arms.   Patient's husband endorses that her memory has gotten worse over the past few months and the weakness has increased over the past 1-2 weeks to the point where the patient was needing to use a walker around the house.   ROS: A 14 point ROS was performed and is negative except as noted in the HPI.     Past Medical History:  Diagnosis Date   Borderline diabetes mellitus    Bronchogenic cancer of right lung    Dyslipidemia    History of radiation therapy    right chest 12/21/2020-12/24/2020  Dr Antony Blackbird   History of radiation therapy    right lung 06/07/2021-06/17/2021  Dr Antony Blackbird   Hypertension    Polymyalgia rheumatica     Family History  Problem Relation Age of Onset   Heart attack Mother    Heart disease Mother    Colon cancer Father    ALS Brother     Social History:  reports that she quit smoking about 13 years ago. Her smoking use included cigarettes. She has a 43.00 pack-year smoking  history. She has never used smokeless tobacco. She reports that she does not currently use alcohol. She reports that she does not use drugs.  Exam: Current vital signs: BP (!) 141/73 (BP Location: Left Arm)   Pulse (!) 58   Temp 98.9 F (37.2 C) (Oral)   Resp 19   Ht 5\' 6"  (1.676 m)   Wt 42.6 kg   SpO2 96%   BMI 15.17 kg/m  Vital signs in last 24 hours: Temp:  [97.9 F (36.6 C)-98.9 F (37.2 C)] 98.9 F (37.2 C) (04/12 0615) Pulse Rate:  [58-64] 58 (04/12 0615) Resp:  [16-20] 19 (04/12 0615) BP: (108-153)/(53-80) 141/73 (04/12 0615) SpO2:  [96 %-97 %] 96 % (04/12 0615)   Physical Exam  Appears well-developed and well-nourished.   Neuro: Mental Status: Patient is awake, alert. Oriented to self, place, year, bot not month.  Patient is unable to give history and sometimes repeats herself.  Cranial Nerves: II: Decreased left lower VF. Pupils are equal, round, and reactive to light.   III,IV, VI: EOMI without ptosis or diploplia.  V: Facial sensation is symmetric to light touch.  VII: Slight right facial droop, at rest.   VIII: hearing is intact to voice X: Uvula elevates symmetrically XI: Shoulder shrug is symmetric. XII: tongue is midline  Motor: Patient unable to initiate movement in BLE. Once placed in a knee-bent position, patient is able to maintain. Able to slowly slide leg to a straight position on  the left side, but not the right.  Tone increased BLE.  4+/5 bicep/tricep BUE, 4/5 grips.  Sensory: Sensation is symmetric to light touch in the arms, decreased bilateral legs and feet.  Cerebellar: FNF  intact bilaterally, unable to perform HKS   I have reviewed labs in epic and the results pertinent to this consultation are: Sodium: 126 Ca: 7.8 LDL: 80 A1c: 5.2 TSH: 2.644  I have reviewed the images obtained:  CTH: Atrophy and chronic small-vessel ischemic change of the white matter. No sign of acute infarction.  MRI Brain:  Few scattered punctate acute  ischemic infarcts involving the bilateral cerebral hemispheres. No associated hemorrhage or mass effect. A central thromboembolic etiology is suspected. No evidence for intracranial metastatic disease.  Irregular flow void within the hypoplastic right vertebral artery, which could be related to slow flow and/or occlusion  MRI Thoracic Spine: 3.5 x 1.5 x 5.1 cm soft tissue involving the left paravertebral soft tissues at T2 through T4, concerning for metastatic disease.  Abnormal signal intensity within the adjacent T2 and T3 vertebral bodies, consistent with osseous involvement. Associated mild pathologic compression fractures at these levels.  Known right upper lobe mass with small layering bilateral pleural effusions  MRI Lumbar Spine: No other evidence for metastatic disease within the lumbar spine.   Impression:  Kristi Jennings is a 77 y.o. female with a history of prediabetes, hyperlipidemia, hypertension, polymyalgia rheumatica, pulmonary emboli on Eliquis, ascending aortic aneurysm, stage IV non-small cell lung cancer with bone and right adrenal metastasis currently on oral chemotherapy. Patient presented secondary to progressive weakness and fatigue with associated sodium of 123, presumed secondary to her oral chemotherapy and dehydration. Patient started on IV fluids and CT imaging concerning for new metastasis. MRI brain positive for scattered punctate acute ischemic infarcts. MRI T spine concerning for metastatic disease. Pending MRI C spine.  Her symptoms do not correlate to the stroke areas, and are more likely an effect of her metastases to the spine.   Recommendations: - Permissive HTN x48 hrs from sx onset or until stroke ruled out by MRI goal BP <220/110. PRN labetalol or hydralazine if BP above these parameters. Avoid oral antihypertensives. - MRI brain wo contrast - Carotid US - TTE  - Check A1c and LDL + add statin per guidelines - Continue eliquis; risk of ischemic stroke  with holding eliquis is higher than risk of hemorrhagic conversion of these small strokes - q4 hr neuro checks - STAT head CT for any change in neuro exam - Tele - PT/OT/SLP - Stroke education - Amb referral to neurology upon discharge   . Note by Hetty Blend and edited later by MD.   Attending Neurohospitalist Addendum Patient seen and examined with APP/Resident. Agree with the history and physical as documented above. Agree with the plan as documented, which I helped formulate. I have edited the note above to reflect my full findings and recommendations. I have independently reviewed the chart, obtained history, review of systems and examined the patient.I have personally reviewed pertinent head/neck/spine imaging (CT/MRI). Please feel free to call with any questions.  Patient has had stage IV lung cancer for the past 2 years. She has had DVT/PE for which she is on eliquis and had 2 small acute infarcts which she is asymptomatic from. I had a very long conversation with patient, husband, and Dr. Yetta Barre with neurosurgery today and we are in agreement that surgical intervention for decompressive laminectomy would not be appropriate given the high risk, particularly  with reversing Kcentra, for little potential benefit. She is unlikely to regain use of her legs even with surgery. Patient and her husband are in agreement with this. Recommend STAT consult to radiation oncology for palliative radiation to the spine. Will continue to follow.  Bing Neighbors, MD [facility] [phone]  If 7pm- 7am, please page neurology on call as listed in [facility].   -- Bing Neighbors, MD [facility] [phone]  If 7pm- 7am, please page neurology on call as listed in [facility].

## 2022-09-01 NOTE — Progress Notes (Signed)
Patient in nursing clinic awaiting radiation treatment.  She reports severe pain 10/10 in her back.  MD made aware and order for 10 mg Oxycodone po was given.  Patient vitals stable.  Patient resting in bed with husband at bedside.  BP 138/65   Pulse (!) 57   Temp 97.7 F (36.5 C) (Oral)   Resp 14   SpO2 99%    Evon Lopezperez M. Vickii Chafe, BSN

## 2022-09-01 NOTE — Progress Notes (Signed)
Radiation Oncology         [phone] ________________________________  Inpatient Re-Consultation  Name: MARAH PARK MRN: [id number]  Date: 09/01/2022  DOB: Feb 08, 1946  WU:JWJX, Lollie Marrow, NP  Narda Bonds, MD   REFERRING PHYSICIAN: Narda Bonds, MD  DIAGNOSIS:    ICD-10-CM   1. Metastatic bone tumor  C79.51        Cancer Staging  Non-small cell carcinoma of lung, stage 4, right Staging form: Lung, AJCC 8th Edition - Clinical: Stage IVB (cT3, cN2, cM1c) - Signed by Si Gaul, MD on 11/30/2020   2) Radiation Treatment Dates: 06/07/2021 through 06/17/2021 Site Technique Total Dose (Gy) Dose per Fx (Gy) Completed Fx Beam Energies  Lung, Right: Lung_R IMRT 24/24 3 8/8 6X    1) Treatment Dates: 12/13/20- 12/24/2020 Site/Dose: Chest_Rt - 30.00 of 30.00Gy, 10 of 10 fractions delivered; 3.00 Gy/Fx    CHIEF COMPLAINT: Here to discuss management of spine cancer  HISTORY OF PRESENT ILLNESS::Jentry K Oley is a 77 y.o. female who  is well known by my colleague, Dr. Roselind Messier, for STAGE IV lung cancer, with prior treatment to right lung for this.  She saw him in March  for re-evaluation and an opinion concerning radiation therapy as part of management for her recent extension of disease about the T4 vertebra.  The area of concern was a small soft tissue component adjacent to the left portion of the vertebral body. He discussed with the patient that her discomfort in the left upper back would not likely be caused by that issue.  There did not appear to be any nerve root issues and her pain had mostly resolved by time of her re consultation with him. He therefore recommended to hold off on palliative radiation therapy at that time and reevaluate with CT scanning at a later date.  This week,  patient presented secondary to progressive weakness and fatigue with associated sodium of 123, presumed secondary to her oral chemotherapy and dehydration. She also reported weakness in her legs.  Patient started on IV fluids. MRI  of brain showed scattered infarcts but no tumor.  MRI of T-L spine most notable for abnormal signal abnormality involving the T2 and T3 vertebral bodies, consistent with osseous metastatic disease. Abnormal paravertebral soft tissue along the left aspect of the vertebral column at the level of T3, likely reflecting metastatic disease. Paravertebral component measures approximately 3.5 x 1.5 x 5.1 cm (series 28). Additionally, there is abnormal soft tissue density filling the spinal canal at the levels of T2 through T4 (series 27, image 10), consistent with epidural extension of tumor. The underlying native cord is somewhat difficult to visualize at this level on this motion degraded exam, with possible intramedullary involvement not excluded. The cord itself appears to be somewhat expanded. No overt cord signal changes seen on this limited exam. Overall, degree of intracanalicular involvement measures approximately 4.3 cm in craniocaudad dimension. There is moderate to severe spinal stenosis at this level. Tumor seen extending into and largely obliterating the T2-3 and T3-4 neural foramina bilaterally.  I have personally reviewed her images.  Additional compression deformities involving the T8 and T12 vertebral bodies, also likely pathologic.  These lesions appear less severe than the upper thoracic disease   I spoke with Dr. Mal Misty who states [facility] does not recommend surgery.   The patient is here with her husband. She reports feeling overwhelmed and very upset by the recent developments in her health.  She says it is hard  to tell how good her bowel and bladder function are because she has been bedridden.  She states that someone had to help her evacuate her bowels yesterday.  She reports pain in the right shoulder blade area but denies pain elsewhere in her back  She is unable to ambulate at this point in time.  PREVIOUS RADIATION THERAPY: Yes as above -I have personally  reviewed her plans to understand where previous radiation was deposited in her thorax  PAST MEDICAL HISTORY:  has a past medical history of Borderline diabetes mellitus, Bronchogenic cancer of right lung, Dyslipidemia, History of radiation therapy, History of radiation therapy, Hypertension, and Polymyalgia rheumatica.    PAST SURGICAL HISTORY:No past surgical history on file.  FAMILY HISTORY: family history includes ALS in her brother; Colon cancer in her father; Heart attack in her mother; Heart disease in her mother.  SOCIAL HISTORY:  reports that she quit smoking about 13 years ago. Her smoking use included cigarettes. She has a 43.00 pack-year smoking history. She has never used smokeless tobacco. She reports that she does not currently use alcohol. She reports that she does not use drugs.  ALLERGIES: Shellfish-derived products and Augmentin [amoxicillin-pot clavulanate]  MEDICATIONS:  No current facility-administered medications for this encounter.   No current outpatient medications on file.   Facility-Administered Medications Ordered in Other Encounters  Medication Dose Route Frequency Provider Last Rate Last Admin   acetaminophen (TYLENOL) tablet 650 mg  650 mg Oral Q6H PRN Lurline Del, MD   650 mg at 08/31/22 8250   Or   acetaminophen (TYLENOL) suppository 650 mg  650 mg Rectal Q6H PRN Lurline Del, MD       albuterol (PROVENTIL) (2.5 MG/3ML) 0.083% nebulizer solution 2.5 mg  2.5 mg Nebulization Q2H PRN Lurline Del, MD       atenolol (TENORMIN) tablet 25 mg  25 mg Oral Q M,W,F Narda Bonds, MD   25 mg at 09/01/22 0810   dexamethasone (DECADRON) injection 8 mg  8 mg Intravenous Once Narda Bonds, MD       [START ON 09/02/2022] dexamethasone (DECADRON) tablet 6 mg  6 mg Oral Q8H Narda Bonds, MD       escitalopram (LEXAPRO) tablet 20 mg  20 mg Oral QHS Narda Bonds, MD   20 mg at 08/31/22 2112   gabapentin (NEURONTIN) capsule 300 mg  300 mg Oral  QHS Narda Bonds, MD   300 mg at 08/31/22 2111   HYDROmorphone (DILAUDID) tablet 4 mg  4 mg Oral Q6H PRN Narda Bonds, MD   4 mg at 09/01/22 1240   LORazepam (ATIVAN) tablet 1 mg  1 mg Oral Once PRN Narda Bonds, MD       melatonin tablet 10 mg  10 mg Oral QHS PRN Anthoney Harada, NP   10 mg at 08/31/22 2111   morphine (MS CONTIN) 12 hr tablet 30 mg  30 mg Oral Q12H Narda Bonds, MD   30 mg at 09/01/22 0810   ondansetron (ZOFRAN) tablet 4 mg  4 mg Oral Q6H PRN Lurline Del, MD       Or   ondansetron [facility]) injection 4 mg  4 mg Intravenous Q6H PRN Lurline Del, MD       polyethylene glycol (MIRALAX / GLYCOLAX) packet 17 g  17 g Oral BID Narda Bonds, MD   17 g at 09/01/22 0809   senna-docusate (Senokot-S) tablet 1 tablet  1  tablet Oral BID Narda Bonds, MD   1 tablet at 09/01/22 4696    REVIEW OF SYSTEMS:  Notable for that above.   PHYSICAL EXAM:  vitals were not taken for this visit.   General: Alert and oriented, in mild emotional distress HEENT: Head is normocephalic. Extraocular movements are intact.  Extremities: No cyanosis or edema. Musculoskeletal: symmetric strength and muscle tone throughout upper extremities.  She is inconsistently wiggle her toes bilaterally.  Otherwise she is not able to move her lower extremities at this time. Neurologic: Cranial nerves II through XII are grossly intact.  Speech is fluent.  She reports numbness in her lower legs when I touch them as well as when her husband touches them Psychiatric: Judgment and insight are intact. Affect is appropriate.  ECOG =  3  0 - Asymptomatic (Fully active, able to carry on all predisease activities without restriction)  1 - Symptomatic but completely ambulatory (Restricted in physically strenuous activity but ambulatory and able to carry out work of a light or sedentary nature. For example, light housework, office work)  2 - Symptomatic, <50% in bed during the day (Ambulatory and  capable of all self care but unable to carry out any work activities. Up and about more than 50% of waking hours)  3 - Symptomatic, >50% in bed, but not bedbound (Capable of only limited self-care, confined to bed or chair 50% or more of waking hours)  4 - Bedbound (Completely disabled. Cannot carry on any self-care. Totally confined to bed or chair)  5 - Death   Santiago Glad MM, Creech RH, Tormey DC, et al. [phone]). "Toxicity and response criteria of the [facility]". Am. Evlyn Clines. Oncol. 5 (6): 649-55   LABORATORY DATA:  Lab Results  Component Value Date   WBC 8.9 08/30/2022   HGB 10.4 (L) 08/30/2022   HCT 31.4 (L) 08/30/2022   MCV 88.5 08/30/2022   PLT 185 08/30/2022   CMP     Component Value Date/Time   NA 126 (L) 09/01/2022 0610   K 4.1 09/01/2022 0610   CL 95 (L) 09/01/2022 0610   CO2 23 09/01/2022 0610   GLUCOSE 82 09/01/2022 0610   BUN 9 09/01/2022 0610   CREATININE 0.64 09/01/2022 0610   CREATININE 1.08 (H) 08/29/2022 1221   CALCIUM 7.8 (L) 09/01/2022 0610   PROT 6.4 (L) 08/30/2022 1412   ALBUMIN 3.3 (L) 08/30/2022 1412   AST 31 08/30/2022 1412   AST 30 08/29/2022 1221   ALT 16 08/30/2022 1412   ALT 13 08/29/2022 1221   ALKPHOS 56 08/30/2022 1412   BILITOT 0.7 08/30/2022 1412   BILITOT 0.5 08/29/2022 1221   GFRNONAA >60 09/01/2022 0610   GFRNONAA 54 (L) 08/29/2022 1221         RADIOGRAPHY: MR LUMBAR SPINE WO CONTRAST  Result Date: 08/31/2022 CLINICAL DATA:  Initial evaluation for metastatic disease. EXAM: MRI LUMBAR SPINE WITHOUT CONTRAST TECHNIQUE: Multiplanar, multisequence MR imaging of the lumbar spine was performed. No intravenous contrast was administered. COMPARISON:  Prior CT from earlier the same day. FINDINGS: Segmentation: Standard. Lowest well-formed disc space labeled the L5-S1 level. Alignment: Mild exaggeration of the normal lumbar lordosis. Grade 1 stepwise degenerative retrolisthesis of L1 on L2 through L4 on L5. Vertebrae:  Compression deformity involving the T12 vertebral body with up to 70% height loss and trace 3 mm bony retropulsion. This is suspected to be pathologic in nature. No significant residual marrow edema. Additional mild benign/mechanical appearing compression deformity  noted at the superior endplate of L1. Otherwise, vertebral body height maintained. Bone marrow signal intensity otherwise within normal limits. No other evidence for metastatic disease within the lumbar spine. Conus medullaris and cauda equina: Conus extends to the L2 level. Conus and cauda equina appear normal. No visible epidural involvement of tumor. Paraspinal and other soft tissues: Paraspinous soft tissues demonstrate no acute finding. No paraspinous soft tissue mass or collection. Disc levels: L1-2: Degenerative intervertebral disc space narrowing with diffuse disc bulge and disc desiccation. Mild facet hypertrophy. No more than mild spinal stenosis. Foramina remain patent. L2-3: Degenerative intervertebral disc space narrowing with diffuse disc bulge and disc desiccation. Mild facet hypertrophy. Resultant mild canal with bilateral lateral recess stenosis, worse on the right. Foramina remain patent. L3-4: Disc bulge with disc desiccation. Disc bulging asymmetric to the right. Mild to moderate facet hypertrophy. Resultant mild-to-moderate canal with bilateral subarticular stenosis. Moderate bilateral L3 foraminal narrowing. L4-5: Degenerative intervertebral disc space narrowing with diffuse disc bulge and reactive endplate spurring. Moderate facet and ligament flavum hypertrophy. Resultant moderate to severe canal with bilateral subarticular stenosis. Severe bilateral L4 foraminal narrowing. L5-S1: Mild disc bulge with disc desiccation. Moderate facet hypertrophy. Resultant mild narrowing of the lateral recesses bilaterally. Central canal remains patent. No significant foraminal stenosis. IMPRESSION: 1. Compression deformity involving the T12  vertebral body with up to 70% height loss and trace 3 mm bony retropulsion. This is suspected to be pathologic in nature. 2. No other evidence for metastatic disease within the lumbar spine. 3. Multilevel lumbar spondylosis with resultant diffuse spinal stenosis, moderate to severe in nature at L4-5. 4. Moderate bilateral L3 and severe bilateral L4 foraminal stenosis related to disc bulge, reactive endplate spurring, and facet hypertrophy. Electronically Signed   By: Rise Mu M.D.   On: 08/31/2022 19:49   MR THORACIC SPINE WO CONTRAST  Result Date: 08/31/2022 CLINICAL DATA:  Initial evaluation for metastatic disease. EXAM: MRI THORACIC SPINE WITHOUT CONTRAST TECHNIQUE: Multiplanar, multisequence MR imaging of the thoracic spine was performed. No intravenous contrast was administered. COMPARISON:  Prior CT from earlier the same day. FINDINGS: Alignment:  Examination degraded by motion artifact. Straightening of the normal thoracic kyphosis. No significant listhesis. Vertebrae: Abnormal signal abnormality involving the T2 and T3 vertebral bodies, consistent with osseous metastatic disease. Abnormal paravertebral soft tissue along the left aspect of the vertebral column at the level of T3, likely reflecting metastatic disease. Paravertebral component measures approximately 3.5 x 1.5 x 5.1 cm (series 28). Additionally, there is abnormal soft tissue density filling the spinal canal at the levels of T2 through T4 (series 27, image 10), consistent with epidural extension of tumor. The underlying native cord is somewhat difficult to visualize at this level on this motion degraded exam, with possible intramedullary involvement not excluded. The cord itself appears to be somewhat expanded. No overt cord signal changes seen on this limited exam. Overall, degree of intracanalicular involvement measures approximately 4.3 cm in craniocaudad dimension. There is moderate to severe spinal stenosis at this level.  Tumor seen extending into and largely obliterating the T2-3 and T3-4 neural foramina bilaterally. Associated mild pathologic compression fractures at the superior endplates of T2 and T3. Additional compression deformity at the superior endplate of T4 noted, also potentially pathologic. Additional compression deformities involving the T8 and T12 vertebral bodies, favored to be pathologic in nature. Height loss is maximal at T12 measuring up to approximately 70% with no more than trace 2-3 mm bony retropulsion. No significant stenosis at these levels.  Elsewhere, bone marrow signal intensity is within normal limits. Vertebral body height otherwise maintained. Cord: Intracanalicular tumor involvement at T2 through T4 as above. Again, no obvious cord signal changes on this limited exam. Otherwise normal signal and morphology. No other epidural involvement of malignancy identified. Paraspinal and other soft tissues: Paravertebral tumor at the levels of T3-4 as above. This is in close proximity to patient's known right upper lobe mass. Small layering bilateral pleural effusions. No other acute finding. Disc levels: Underlying mild for age multilevel degenerative spondylosis with mild noncompressive disc bulging at T7-8 and T8-9. No other significant spinal stenosis. Moderate bilateral foraminal narrowing noted at T8-9 and T9-10. No other significant stenosis. IMPRESSION: 1. 3.5 x 1.5 x 5.1 cm soft tissue involving the left paravertebral soft tissues at T2 through T4, concerning for metastatic disease. Abnormal signal intensity within the adjacent T2 and T3 vertebral bodies, consistent with osseous involvement. Associated mild pathologic compression fractures at these levels. Additional mild compression deformity at T4, also likely involved. 2. Associated intracanalicular extension of tumor at the levels of T2 through T4 with resultant moderate to severe spinal stenosis. The underlying native cord is difficult to visualize  on this motion degraded exam, with possible intramedullary involvement not excluded. No overt cord signal changes seen on this limited exam. 3. Additional compression deformities involving the T8 and T12 vertebral bodies, also likely pathologic. 4. Known right upper lobe mass with small layering bilateral pleural effusions, better evaluated on prior chest CT. 5. Underlying mild for age thoracic spondylosis without other high-grade spinal stenosis. Moderate bilateral foraminal narrowing at T8-9 and T9-10. Electronically Signed   By: Rise Mu M.D.   On: 08/31/2022 19:41   MR BRAIN WO CONTRAST  Result Date: 08/31/2022 CLINICAL DATA:  Initial evaluation for metastatic disease. EXAM: MRI HEAD WITHOUT CONTRAST TECHNIQUE: Multiplanar, multiecho pulse sequences of the brain and surrounding structures were obtained without intravenous contrast. COMPARISON:  CT from 08/30/2022 as well as prior MRI from 06/28/2022. FINDINGS: Brain: Moderate to advance cerebral atrophy. Moderately advanced chronic microvascular ischemic disease, similar to prior. Few scattered foci of punctate restricted diffusion are seen involving the bilateral cerebral hemispheres, consistent with small acute ischemic infarcts (series 5, images 41, 40, 38, 32, 29). No associated hemorrhage or mass effect. No other evidence for acute or subacute ischemia. Gray-white matter distinction otherwise maintained. No areas of chronic cortical infarction. No acute or chronic intracranial blood products. No mass lesion or evidence for intracranial metastatic disease on this noncontrast examination. No mass effect or midline shift. No hydrocephalus or extra-axial fluid collection. Pituitary gland and suprasellar region within normal limits. Vascular: Irregular flow void within the hypoplastic right vertebral artery, which could be related to slow flow and/or occlusion. Major intracranial vascular flow voids are otherwise maintained. Skull and upper  cervical spine: Craniocervical junction within normal limits. Bone marrow signal intensity normal. No focal marrow replacing lesion. No scalp soft tissue abnormality. Sinuses/Orbits: Globes and orbital soft tissues within normal limits. Scattered mucosal thickening present about the sphenoethmoidal and maxillary sinuses, most pronounced within the right sphenoid sinus. No mastoid effusion. Other: None. IMPRESSION: 1. Few scattered punctate acute ischemic infarcts involving the bilateral cerebral hemispheres. No associated hemorrhage or mass effect. A central thromboembolic etiology is suspected. 2. No evidence for intracranial metastatic disease on this noncontrast examination. 3. Irregular flow void within the hypoplastic right vertebral artery, which could be related to slow flow and/or occlusion. 4. Underlying moderate to advanced cerebral atrophy with chronic microvascular ischemic disease.  Electronically Signed   By: Rise Mu M.D.   On: 08/31/2022 19:25   CT CHEST W CONTRAST  Result Date: 08/31/2022 CLINICAL DATA:  Rib fracture suspected.  Non-small cell lung cancer. EXAM: CT CHEST WITH CONTRAST TECHNIQUE: Multidetector CT imaging of the chest was performed during intravenous contrast administration. RADIATION DOSE REDUCTION: This exam was performed according to the departmental dose-optimization program which includes automated exposure control, adjustment of the mA and/or kV according to patient size and/or use of iterative reconstruction technique. CONTRAST:  75mL OMNIPAQUE IOHEXOL 300 MG/ML  SOLN COMPARISON:  Chest x-ray same day.  CT chest 07/04/2022. FINDINGS: Cardiovascular: The ascending aorta is dilated measuring 4.0 x 4.3 cm, unchanged. There is no evidence for dissection. There are atherosclerotic calcifications of the aorta. The heart is normal in size. There is no pericardial effusion. There is no large central pulmonary embolism. Mediastinum/Nodes: No enlarged mediastinal, hilar,  or axillary lymph nodes. Thyroid gland, trachea, and esophagus demonstrate no significant findings. Lungs/Pleura: There are new small bilateral pleural effusions. Right upper lobe/apical cavitary mass appears unchanged from the prior examination extending to the hilum. Right upper lobe bronchus remains patent. Scarring in the right middle lobe is stable. There is no new focal lung infiltrate or pneumothorax. Upper Abdomen: No acute abnormality. Musculoskeletal: Osseous metastatic disease again seen most significant at T3, T8 and T12. T4, T8 and T12 compression deformities are stable. There is increasing anterior and left paramediastinal low-attenuation density at the L3-L4 level. This measures 3.6 x 1.8 x 3.4 cm on axial image 5/30 and sagittal image 7/73 and has increased compared to prior study. Right anterior L4 rib metastatic lesion is unchanged. IMPRESSION: 1. New small bilateral pleural effusions. 2. Stable right upper lobe/apical cavitary mass extending to the hilum. 3. Stable osseous metastatic disease. 4. Increasing anterior left paramediastinal low-attenuation density at the L3-L4 level. Findings may represent enlarging metastatic lesion or abscess. Consider further evaluation with MRI. Aortic Atherosclerosis (ICD10-I70.0). Electronically Signed   By: Darliss Cheney M.D.   On: 08/31/2022 18:44   CT HEAD WO CONTRAST ( )  Result Date: 08/30/2022 CLINICAL DATA:  Sudden numbness and tingling in the legs and feet. 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 Metastatic lung cancer. EXAM: CT HEAD WITHOUT CONTRAST TECHNIQUE: Contiguous axial images were obtained from the base of the skull through the vertex without intravenous contrast. RADIATION DOSE REDUCTION: This exam was performed according to the departmental dose-optimization program which includes automated exposure control, adjustment of the mA and/or kV according to patient size and/or use of iterative reconstruction technique. COMPARISON:  MRI 06/28/2022  FINDINGS: Brain: No focal abnormality is seen affecting the brainstem or cerebellum. Cerebral hemispheres show atrophy and chronic small-vessel ischemic change throughout the white matter. No sign of acute infarction. Axial image 20 suggests a possible 3-4 mm slightly hyperdense focus in the subcortical white matter of the right frontal lobe. Nothing was seen in this location on the previous MRI. This could be interval development of a metastasis. Consider repeat MRI. No evidence of hemorrhage, hydrocephalus or extra-axial collection. Vascular: There is atherosclerotic calcification of the major vessels at the base of the brain. 5 mm aneurysm shown by MRI cannot be specifically seen by noncontrast CT. Skull: Normal Sinuses/Orbits: Clear/normal Other: None IMPRESSION: 1. Atrophy and chronic small-vessel ischemic change of the white matter. No sign of acute infarction. 2. Axial image 20 suggests a possible 3-4 mm slightly hyperdense focus in the subcortical white matter of the right frontal lobe. Nothing was seen in this location  on the previous MRI. This could be interval development of a small metastasis. Consider repeat MRI. 3. 5 mm aneurysm shown by MRI cannot be specifically seen by noncontrast CT. Electronically Signed   By: Paulina Fusi M.D.   On: 08/30/2022 16:04   DG Lumbar Spine Complete  Result Date: 08/30/2022 CLINICAL DATA:  Bilateral lower extremity weakness several days. Increasing weakness/dehydration recently. Stage IV lung cancer. EXAM: LUMBAR SPINE - COMPLETE 4+ VIEW COMPARISON:  CT 07/04/2022 FINDINGS: Vertebral body alignment is normal. Evidence of patient's known severe compression fractures of T8 and T12 thought to be pathologic due to underlying metastatic disease from known lung cancer. No new compression fractures. Moderate spondylosis of the lumbar spine to include moderate facet arthropathy over the lower lumbar spine. Moderate multilevel disc disease over the lumbar spine most  prominent at the L2-3 and L4-5 levels. Mild degenerative change of the hips. Remainder of the exam is unchanged. IMPRESSION: 1. No acute findings. 2. Moderate spondylosis of the lumbar spine with moderate multilevel disc disease. 3. Known severe compression fractures of T8 and T12 thought to be pathologic due to known underlying metastatic lung cancer Electronically Signed   By: Elberta Fortis M.D.   On: 08/30/2022 14:51   DG Chest 2 View  Result Date: 08/30/2022 CLINICAL DATA:  Weakness EXAM: CHEST - 2 VIEW COMPARISON:  CXR 01/05/21 FINDINGS: No pleural effusion. No pneumothorax. Increased asymmetric elevation of the right hemidiaphragm compared to 01/05/2021 likely secondary to interval volume loss. Unchanged appearance of the right lung apex compared to recent CT chest dated 07/04/2022. Normal cardiac and mediastinal contours. No radiographically apparent displaced rib fractures. Visualized upper abdomen is unremarkable. Vertebral body heights are unchanged compared to recent prior CT chest abdomen and pelvis dated 07/04/2022 with compression deformities in the mid and lower thoracic spine IMPRESSION: Persistent mass like thickening at the right lung apex is unchanged compared to 07/04/22 CT chest/abdomen/pelvis. No new focal airspace opacity. Electronically Signed   By: Lorenza Cambridge M.D.   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  Her MRI is of poor quality but it is consistent with progressive metastatic disease and while there is not an obvious cord compression, this is possible.  She has progressive neurologic symptoms including loss of ability to walk and numbness in her legs.  She asked about whether she should proceed with a repeat MRI as recommended by her inpatient team.  I stated that this could be helpful if she is able to tolerated it, and in the meantime I recommend that we start urgent radiation  therapy today to try to improve her symptoms and restore neurologic function.  I have spoken about her case with Dr. Mal Misty.  Recommend that she start high-dose dexamethasone.  He states that he will begin this as soon as possible.  I also recommend omeprazole for GI prophylaxis.  Today, I talked to the patient about the findings and work-up thus far.  We discussed the patient's diagnosis of metastatic disease to spine and general treatment for this, highlighting the role of palliative radiotherapy in the management.  We discussed the available radiation techniques, and focused on the details of logistics and delivery.   I recommend that she receive a large dose of radiation today from T1-T5 (4 Wallace Cullens) and then resume radiation on April 15, when her staff is back in the clinic, at a dose of  3 Gray per day for 9 more fractions.  If another MRI of better quality is done over the weekend we can factor that into her treatment plan.  We discussed the risks, benefits, and side effects of radiotherapy. Side effects may include but not necessarily be limited to: Skin irritation, fatigue, esophagitis, rare internal organ injuries to tissues in the thorax.  I discussed that she has had previous radiation therapy to the upper thorax and that I have reviewed her prior plans and will take previous radiation exposure into account to minimize the risk of side effects and tissue injury.  She understands that treatment is optional. No guarantees of treatment were given. A consent form was signed and placed in the patient's medical record. The patient was encouraged to ask questions that I answered to the best of my ability.  She and her husband are enthusiastic to proceed.  First fraction will be delivered as soon as possible this evening and we will resume treatment as described above on 09/04/22.   On date of service, in total, I spent 45 minutes on this encounter. Patient was seen in  person.   __________________________________________   Lonie Peak, MD

## 2022-09-01 NOTE — Progress Notes (Signed)
Pt has been down to MRI 3 times. Pt is in extreme pain and is unable to tolerate laying flat and/or still. Images obtained so far had to be aborted prior to being completed due to pt not being able to continue because of pain. Another MRI was ordered on pt this morning. RN stated pt seemed willing to attempt the exam although she has been given multiple doses of Dilaudid and Morphine with minimal, if any, pain relief. Technologist went up to speak with pt about if she was ready to attempt another MRI. Pt very unsure about having exam done. Technologist attempted to lay pt's bed flat while pt still in her room to see if pt could tolerate being flat. Pt began crying in pain. Pt still unsure if she wants to attempt another MRI. Technologist told pt she could continue to think about it and we will check back again later.

## 2022-09-01 NOTE — TOC Progression Note (Addendum)
Transition of Care [facility]) - Progression Note    Patient Details  Name: Kristi Jennings MRN: [id number] Date of Birth: [birth year]  [facility] of [facility]) CM/SW Contact  Ilija Maxim, [name], RN Phone Number: 09/01/2022, 2:03 PM  Clinical Narrative:  Bed offers given to [name](spouse)-await choice prior auth.     Expected Discharge Plan: Skilled Nursing Facility Barriers to Discharge: Continued Medical Work up  Expected Discharge Plan and Services   Discharge Planning Services: CM Consult   Living arrangements for the past 2 months: Single Family Home                                       Social Determinants of Health (SDOH) Interventions SDOH Screenings   Food Insecurity: No Food Insecurity (08/31/2022)  Housing: Low Risk  (08/31/2022)  Transportation Needs: No Transportation Needs (08/31/2022)  Utilities: Not At Risk (08/31/2022)  Depression (PHQ2-9): Low Risk  (10/11/2021)  Tobacco Use: Medium Risk (08/30/2022)    Readmission Risk Interventions    01/06/2021    1:42 PM  Readmission Risk Prevention Plan  Transportation Screening Complete  PCP or Specialist Appt within 3-5 Days Complete  HRI or Home Care Consult Complete  Social Work Consult for Recovery Care Planning/Counseling Complete  Palliative Care Screening Complete  Medication Review Oceanographer) Complete

## 2022-09-02 ENCOUNTER — Inpatient Hospital Stay (HOSPITAL_COMMUNITY): Payer: Medicare HMO

## 2022-09-02 DIAGNOSIS — I639 Cerebral infarction, unspecified: Secondary | ICD-10-CM | POA: Diagnosis not present

## 2022-09-02 DIAGNOSIS — I6389 Other cerebral infarction: Secondary | ICD-10-CM

## 2022-09-02 DIAGNOSIS — R29898 Other symptoms and signs involving the musculoskeletal system: Secondary | ICD-10-CM | POA: Diagnosis not present

## 2022-09-02 DIAGNOSIS — E871 Hypo-osmolality and hyponatremia: Secondary | ICD-10-CM | POA: Diagnosis not present

## 2022-09-02 LAB — ECHOCARDIOGRAM COMPLETE
AR max vel: 2.57 cm2
AV Area VTI: 3.11 cm2
AV Area mean vel: 2.81 cm2
AV Mean grad: 2 mmHg
AV Peak grad: 4.2 mmHg
Ao pk vel: 1.02 m/s
Area-P 1/2: 3.31 cm2
Height: 66 in
S' Lateral: 2.4 cm
Single Plane A2C EF: 60.5 %
Weight: 1566.15 oz

## 2022-09-02 LAB — BASIC METABOLIC PANEL
Anion gap: <3 — ABNORMAL LOW (ref 5–15)
BUN: 10 mg/dL (ref 8–23)
CO2: 27 mmol/L (ref 22–32)
Calcium: 7.7 mg/dL — ABNORMAL LOW (ref 8.9–10.3)
Chloride: 92 mmol/L — ABNORMAL LOW (ref 98–111)
Creatinine, Ser: 0.72 mg/dL (ref 0.44–1.00)
GFR, Estimated: >60 mL/min (ref >60)
Glucose, Bld: 132 mg/dL — ABNORMAL HIGH (ref 70–99)
Potassium: 4.9 mmol/L (ref 3.5–5.1)
Sodium: 121 mmol/L — ABNORMAL LOW (ref 135–145)

## 2022-09-02 LAB — GLUCOSE, CAPILLARY
Glucose-Capillary: 132 mg/dL — ABNORMAL HIGH (ref 70–99)
Glucose-Capillary: 166 mg/dL — ABNORMAL HIGH (ref 70–99)
Glucose-Capillary: 279 mg/dL — ABNORMAL HIGH (ref 70–99)
Glucose-Capillary: 318 mg/dL — ABNORMAL HIGH (ref 70–99)
Glucose-Capillary: 182 mg/dL — ABNORMAL HIGH (ref 70–99)

## 2022-09-02 LAB — OSMOLALITY, URINE: Osmolality, Ur: 401 mOsm/kg (ref 300–900)

## 2022-09-02 MED ORDER — SODIUM CHLORIDE 1 G PO TABS
1.0000 g | ORAL_TABLET | Freq: Three times a day (TID) | ORAL | Status: DC
  Administered 2022-09-02 – 2022-09-11 (×25): 1 g via ORAL
  Filled 2022-09-02 (×27): qty 1

## 2022-09-02 MED ORDER — SODIUM CHLORIDE 0.9 % IV BOLUS
1000.0000 mL | Freq: Once | INTRAVENOUS | Status: AC
  Administered 2022-09-02: 1000 mL via INTRAVENOUS

## 2022-09-02 MED ORDER — MELATONIN 5 MG PO TABS
10.0000 mg | ORAL_TABLET | Freq: Every day | ORAL | Status: DC
  Administered 2022-09-02 – 2022-09-11 (×10): 10 mg via ORAL
  Filled 2022-09-02 (×11): qty 2

## 2022-09-02 MED ORDER — SODIUM CHLORIDE 1 G PO TABS: 1.0000 g | ORAL_TABLET | Freq: Three times a day (TID) | ORAL | Status: DC

## 2022-09-02 MED ORDER — APIXABAN 2.5 MG PO TABS
2.5000 mg | ORAL_TABLET | Freq: Two times a day (BID) | ORAL | Status: DC
  Administered 2022-09-02 – 2022-09-12 (×20): 2.5 mg via ORAL
  Filled 2022-09-02 (×20): qty 1

## 2022-09-02 NOTE — Progress Notes (Signed)
  Echocardiogram 2D Echocardiogram has been performed.  Kristi Jennings 09/02/2022, 9:14 AM

## 2022-09-02 NOTE — TOC Progression Note (Signed)
Transition of Care [facility]) - Progression Note    Patient Details  Name: Kristi Jennings MRN: [id number] Date of Birth: Dec 13, 1945  [facility] CM/SW Contact  Otelia Santee, LCSW Phone Number: 09/02/2022, 2:07 PM  Clinical Narrative:    Spoke with pt's spouse to discuss bed choice. Pt's spouse is hopefull to find placement closer to [facility] where they live. CSW sent out additional referrals to SNF in [place] for review.    Expected Discharge Plan: Skilled Nursing Facility Barriers to Discharge: Continued Medical Work up  Expected Discharge Plan and Services   Discharge Planning Services: CM Consult   Living arrangements for the past 2 months: Single Family Home                                       Social Determinants of Health (SDOH) Interventions SDOH Screenings   Food Insecurity: No Food Insecurity (08/31/2022)  Housing: Low Risk  (08/31/2022)  Transportation Needs: No Transportation Needs (08/31/2022)  Utilities: Not At Risk (08/31/2022)  Depression (PHQ2-9): Low Risk  (10/11/2021)  Tobacco Use: Medium Risk (09/01/2022)    Readmission Risk Interventions    01/06/2021    1:42 PM  Readmission Risk Prevention Plan  Transportation Screening Complete  PCP or Specialist Appt within 3-5 Days Complete  HRI or Home Care Consult Complete  Social Work Consult for Recovery Care Planning/Counseling Complete  Palliative Care Screening Complete  Medication Review Oceanographer) Complete

## 2022-09-02 NOTE — Progress Notes (Signed)
DIAGNOSIS: Stage IV (T3, N2, M1 C) non-small cell lung cancer with unknown histologic subtype.  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   DETECTED ALTERATION(S) / BIOMARKER(S)      % CFDNA OR AMPLIFICATION        ASSOCIATED FDA-APPROVED THERAPIES         CLINICAL TRIAL AVAILABILITY [id number] 8.9%   Sotorasib Yes [id number] 7.2% None     Yes     PRIOR THERAPY:   1) Palliative radiotherapy to the painful bone lesions under the care of Dr. Roselind Messier.  2) Palliative systemic chemotherapy with carboplatin for AUC of 5, Alimta 500 Mg/M2 and Keytruda 200 Mg IV every 3 weeks.  First dose of treatment on December 29, 2020. Status post 18  cycles.  Starting from cycle #5, the patient is going to start maintenance Alimta and Keytruda. Keytruda discontinued from cycle #7 due to myalgias.  Last cycle was given on December 20, 2021 discontinued secondary to disease progression. 3) Possible palliative radiation to the enlarging pulmonary mass under the care of Dr. Roselind Messier.   CURRENT THERAPY: 1) Krazati (Adagrasib) 600 Mg p.o. twice daily.  First dose started January 17, 2022.  Status post 8 months of treatment. 2) Zometa every 6 weeks starting on 02/10/21  Subjective: The patient is seen and examined today.  She is laying comfortably in bed with no concerning complaints except for the weakness in the lower extremities.  She denied having any current chest pain, shortness of breath, cough or hemoptysis.  She has no nausea, vomiting, diarrhea or constipation.  She was admitted to the hospital with worsening generalized weakness and fall.  She had extensive imaging studies performed including CT scan of the chest, abdomen and pelvis that showed new small bilateral pleural effusions likely secondary to her treatment but the patient also has increasing anterior  left paramediastinal density at the level of L3-4 concerning for enlarging metastatic lesion.  MRI of the brain showed few scattered punctate acute ischemic infarcts involving the bilateral cerebral hemispheres with no associated hemorrhage or mass effect.  No evidence of metastatic disease.  MRI of the cervical, thoracic and lumbar spine showed no concerning findings except for the 3.5 x 1.5 x 5.1 cm soft tissue involving the left paravertebral soft tissue at the T2-T4 concerning for metastatic disease there was associated mild pathologic compression fracture at these levels with associated intra canalicular extension of tumor at the levels of T2-T4 with resultant moderate to severe spinal stenosis.  There was some additional compression deformity involving the T8 and T12 vertebral bodies.  Likely pathologic.  Objective: Vital signs in last 24 hours: Temp:  [97.5 F (36.4 C)-97.8 F (36.6 C)] 97.5 F (36.4 C) (04/13 0520) Pulse Rate:  [55-59] 55 (04/13 0520) Resp:  [13-18] 13 (04/13 0520) BP: (106-161)/(58-93) 118/93 (04/13 0520) SpO2:  [96 %-99 %] 96 % (04/13 0520) Weight:  [97 lb 14.2 oz (44.4 kg)] 97 lb 14.2 oz (44.4 kg) (04/13 0500)  Intake/Output from previous day: 04/12 0701 - 04/13 0700 In: 480 [P.O.:480] Out: 125 [Urine:125] Intake/Output this shift: Total I/O In: 25 [P.O.:25] Out: -   General appearance: alert, cooperative, appears stated age, fatigued, and no distress Resp: clear to auscultation bilaterally Cardio: regular rate and rhythm, S1, S2 normal, no murmur, click, rub or gallop GI: soft, non-tender; bowel sounds normal; no  masses,  no organomegaly Extremities: extremities normal, atraumatic, no cyanosis or edema  Lab Results:  Recent Labs    08/30/22 1412  WBC 8.9  HGB 10.4*  HCT 31.4*  PLT 185   BMET Recent Labs    08/30/22 1412 08/30/22 1914 09/01/22 0013 09/01/22 0610  NA 126*   < > 127* 126*  K 4.3  --   --  4.1  CL 92*  --   --  95*  CO2 27  --    --  23  GLUCOSE 100*  --   --  82  BUN 11  --   --  9  CREATININE 0.89  --   --  0.64  CALCIUM 8.4*  --   --  7.8*   < > = values in this interval not displayed.    Studies/Results: MR CERVICAL SPINE WO CONTRAST  Result Date: 09/02/2022 CLINICAL DATA:  Initial evaluation for neck pain, known malignancy. EXAM: MRI CERVICAL SPINE WITHOUT CONTRAST TECHNIQUE: Multiplanar, multisequence MR imaging of the cervical spine was performed. No intravenous contrast was administered. COMPARISON:  Comparison made with prior MRI of the thoracic spine from 08/31/2022 as well as previous MRI of the cervical spine from 01/03/2021. FINDINGS: Alignment: Examination moderately degraded by motion artifact, limiting assessment. Reversal of the normal cervical lordosis, apex at C5. Trace degenerative anterolisthesis of C3 on C4 and C4 on C5 as well as C7 on T1. 3 mm retrolisthesis of C5 on C6. Vertebrae: Question subtly decreased T1 signal intensity within the C5 and C6 vertebral bodies. While this finding could potentially reflect metastatic involvement, overall appearance is similar as compared to prior study from 2022, and favored to be degenerative in nature. Superimposed 6 mm T2/stir hyperintense lesion within the C5 vertebral body noted, likely present on prior, and favored to reflect an atypical hemangioma. No other convincing evidence for malignancy within the cervical spine. Metastatic involvement involving the upper thoracic spine and spinal canal at T2 extending inferiorly, better seen on prior MRI of the thoracic spine. Underlying bone marrow signal intensity within normal limits. No acute or chronic fracture within the cervical spine. Cord: Normal signal morphology seen within the cervical spinal cord itself. No convincing cord signal changes on this motion degraded exam. Intracanalicular/epidural tumor noted at T2 extending inferiorly, better seen on prior thoracic spine exam. Posterior Fossa, vertebral arteries,  paraspinal tissues: Visualized brain and posterior fossa within normal limits. Craniocervical junction normal. Paraspinous soft tissues demonstrate no acute finding. Grossly preserved flow voids seen within the vertebral arteries bilaterally. Disc levels: C2-C3: Minimal disc bulge with endplate spurring. Mild-to-moderate bilateral facet hypertrophy. No spinal stenosis. Moderate left C3 foraminal narrowing. Right neural foramen remains patent. C3-C4: Diffuse disc bulge with bilateral uncovertebral hypertrophy, asymmetric to the right. Resultant broad posterior disc osteophyte complex flattens and partially effaces the ventral thecal sac. Superimposed mild to moderate facet hypertrophy. Resultant mild spinal stenosis. Severe right worse than left C4 foraminal narrowing. C4-C5: Right eccentric disc osteophyte complex. Broad posterior component flattens and partially effaces the ventral thecal sac. Superimposed bilateral facet degeneration. Mild spinal stenosis. Severe right worse than left C5 foraminal narrowing. C5-C6: Advanced degenerative intervertebral disc space narrowing with 3 mm retrolisthesis. Associated diffuse disc osteophyte complex with endplate and uncovertebral spurring. Broad posterior component flattens and effaces the ventral thecal sac. Secondary cord flattening without definite cord signal changes. Severe spinal stenosis at this level with the thecal sac measuring approximately 3 mm in AP diameter at its most narrow point (  series 8, image 30). Severe bilateral C6 foraminal narrowing. C6-C7: Degenerative intervertebral disc space narrowing with diffuse disc osteophyte complex. Broad posterior component flattens and largely effaces the ventral thecal sac. Secondary cord flattening without definite cord signal changes. Severe spinal stenosis with the thecal sac measuring approximately 4-5 mm in AP diameter at its most narrow point. Severe right with moderate to severe left C7 foraminal narrowing.  C7-T1: Negative interspace. Moderate facet hypertrophy. No spinal stenosis. Mild left greater than right C8 foraminal narrowing. IMPRESSION: 1. Motion degraded exam. 2. Subtle T1 hypointense/STIR signal abnormality involving the C5 and C6 vertebral bodies. While this could potentially reflect metastatic disease, overall appearance is similar as compared to prior MRI from 01/03/2021. Given stability, this is therefore favored to be degenerative in nature, although attention at follow-up is recommended. No other visible evidence for metastatic disease within the cervical spine. 3. Metastatic involvement involving the upper thoracic spine and spinal canal at T2 extending inferiorly, better seen on prior MRI of the thoracic spine. 4. Advanced multilevel cervical spondylosis with resultant severe spinal stenosis at C5-6 and C6-7. Secondary cord flattening at these levels without visible cord signal changes. 5. Multifactorial degenerative changes with resultant multilevel foraminal narrowing as above. Notable findings include moderate left C3 foraminal stenosis, with severe bilateral C4 through C7 foraminal narrowing. Electronically Signed   By: Rise Mu M.D.   On: 09/02/2022 02:21   MR LUMBAR SPINE WO CONTRAST  Result Date: 08/31/2022 CLINICAL DATA:  Initial evaluation for metastatic disease. EXAM: MRI LUMBAR SPINE WITHOUT CONTRAST TECHNIQUE: Multiplanar, multisequence MR imaging of the lumbar spine was performed. No intravenous contrast was administered. COMPARISON:  Prior CT from earlier the same day. FINDINGS: Segmentation: Standard. Lowest well-formed disc space labeled the L5-S1 level. Alignment: Mild exaggeration of the normal lumbar lordosis. Grade 1 stepwise degenerative retrolisthesis of L1 on L2 through L4 on L5. Vertebrae: Compression deformity involving the T12 vertebral body with up to 70% height loss and trace 3 mm bony retropulsion. This is suspected to be pathologic in nature. No  significant residual marrow edema. Additional mild benign/mechanical appearing compression deformity noted at the superior endplate of L1. Otherwise, vertebral body height maintained. Bone marrow signal intensity otherwise within normal limits. No other evidence for metastatic disease within the lumbar spine. Conus medullaris and cauda equina: Conus extends to the L2 level. Conus and cauda equina appear normal. No visible epidural involvement of tumor. Paraspinal and other soft tissues: Paraspinous soft tissues demonstrate no acute finding. No paraspinous soft tissue mass or collection. Disc levels: L1-2: Degenerative intervertebral disc space narrowing with diffuse disc bulge and disc desiccation. Mild facet hypertrophy. No more than mild spinal stenosis. Foramina remain patent. L2-3: Degenerative intervertebral disc space narrowing with diffuse disc bulge and disc desiccation. Mild facet hypertrophy. Resultant mild canal with bilateral lateral recess stenosis, worse on the right. Foramina remain patent. L3-4: Disc bulge with disc desiccation. Disc bulging asymmetric to the right. Mild to moderate facet hypertrophy. Resultant mild-to-moderate canal with bilateral subarticular stenosis. Moderate bilateral L3 foraminal narrowing. L4-5: Degenerative intervertebral disc space narrowing with diffuse disc bulge and reactive endplate spurring. Moderate facet and ligament flavum hypertrophy. Resultant moderate to severe canal with bilateral subarticular stenosis. Severe bilateral L4 foraminal narrowing. L5-S1: Mild disc bulge with disc desiccation. Moderate facet hypertrophy. Resultant mild narrowing of the lateral recesses bilaterally. Central canal remains patent. No significant foraminal stenosis. IMPRESSION: 1. Compression deformity involving the T12 vertebral body with up to 70% height loss and trace 3 mm  bony retropulsion. This is suspected to be pathologic in nature. 2. No other evidence for metastatic disease  within the lumbar spine. 3. Multilevel lumbar spondylosis with resultant diffuse spinal stenosis, moderate to severe in nature at L4-5. 4. Moderate bilateral L3 and severe bilateral L4 foraminal stenosis related to disc bulge, reactive endplate spurring, and facet hypertrophy. Electronically Signed   By: Rise Mu M.D.   On: 08/31/2022 19:49   MR THORACIC SPINE WO CONTRAST  Result Date: 08/31/2022 CLINICAL DATA:  Initial evaluation for metastatic disease. EXAM: MRI THORACIC SPINE WITHOUT CONTRAST TECHNIQUE: Multiplanar, multisequence MR imaging of the thoracic spine was performed. No intravenous contrast was administered. COMPARISON:  Prior CT from earlier the same day. FINDINGS: Alignment:  Examination degraded by motion artifact. Straightening of the normal thoracic kyphosis. No significant listhesis. Vertebrae: Abnormal signal abnormality involving the T2 and T3 vertebral bodies, consistent with osseous metastatic disease. Abnormal paravertebral soft tissue along the left aspect of the vertebral column at the level of T3, likely reflecting metastatic disease. Paravertebral component measures approximately 3.5 x 1.5 x 5.1 cm (series 28). Additionally, there is abnormal soft tissue density filling the spinal canal at the levels of T2 through T4 (series 27, image 10), consistent with epidural extension of tumor. The underlying native cord is somewhat difficult to visualize at this level on this motion degraded exam, with possible intramedullary involvement not excluded. The cord itself appears to be somewhat expanded. No overt cord signal changes seen on this limited exam. Overall, degree of intracanalicular involvement measures approximately 4.3 cm in craniocaudad dimension. There is moderate to severe spinal stenosis at this level. Tumor seen extending into and largely obliterating the T2-3 and T3-4 neural foramina bilaterally. Associated mild pathologic compression fractures at the superior  endplates of T2 and T3. Additional compression deformity at the superior endplate of T4 noted, also potentially pathologic. Additional compression deformities involving the T8 and T12 vertebral bodies, favored to be pathologic in nature. Height loss is maximal at T12 measuring up to approximately 70% with no more than trace 2-3 mm bony retropulsion. No significant stenosis at these levels. Elsewhere, bone marrow signal intensity is within normal limits. Vertebral body height otherwise maintained. Cord: Intracanalicular tumor involvement at T2 through T4 as above. Again, no obvious cord signal changes on this limited exam. Otherwise normal signal and morphology. No other epidural involvement of malignancy identified. Paraspinal and other soft tissues: Paravertebral tumor at the levels of T3-4 as above. This is in close proximity to patient's known right upper lobe mass. Small layering bilateral pleural effusions. No other acute finding. Disc levels: Underlying mild for age multilevel degenerative spondylosis with mild noncompressive disc bulging at T7-8 and T8-9. No other significant spinal stenosis. Moderate bilateral foraminal narrowing noted at T8-9 and T9-10. No other significant stenosis. IMPRESSION: 1. 3.5 x 1.5 x 5.1 cm soft tissue involving the left paravertebral soft tissues at T2 through T4, concerning for metastatic disease. Abnormal signal intensity within the adjacent T2 and T3 vertebral bodies, consistent with osseous involvement. Associated mild pathologic compression fractures at these levels. Additional mild compression deformity at T4, also likely involved. 2. Associated intracanalicular extension of tumor at the levels of T2 through T4 with resultant moderate to severe spinal stenosis. The underlying native cord is difficult to visualize on this motion degraded exam, with possible intramedullary involvement not excluded. No overt cord signal changes seen on this limited exam. 3. Additional  compression deformities involving the T8 and T12 vertebral bodies, also likely pathologic. 4.  Known right upper lobe mass with small layering bilateral pleural effusions, better evaluated on prior chest CT. 5. Underlying mild for age thoracic spondylosis without other high-grade spinal stenosis. Moderate bilateral foraminal narrowing at T8-9 and T9-10. Electronically Signed   By: Rise Mu M.D.   On: 08/31/2022 19:41   MR BRAIN WO CONTRAST  Result Date: 08/31/2022 CLINICAL DATA:  Initial evaluation for metastatic disease. EXAM: MRI HEAD WITHOUT CONTRAST TECHNIQUE: Multiplanar, multiecho pulse sequences of the brain and surrounding structures were obtained without intravenous contrast. COMPARISON:  CT from 08/30/2022 as well as prior MRI from 06/28/2022. FINDINGS: Brain: Moderate to advance cerebral atrophy. Moderately advanced chronic microvascular ischemic disease, similar to prior. Few scattered foci of punctate restricted diffusion are seen involving the bilateral cerebral hemispheres, consistent with small acute ischemic infarcts (series 5, images 41, 40, 38, 32, 29). No associated hemorrhage or mass effect. No other evidence for acute or subacute ischemia. Gray-white matter distinction otherwise maintained. No areas of chronic cortical infarction. No acute or chronic intracranial blood products. No mass lesion or evidence for intracranial metastatic disease on this noncontrast examination. No mass effect or midline shift. No hydrocephalus or extra-axial fluid collection. Pituitary gland and suprasellar region within normal limits. Vascular: Irregular flow void within the hypoplastic right vertebral artery, which could be related to slow flow and/or occlusion. Major intracranial vascular flow voids are otherwise maintained. Skull and upper cervical spine: Craniocervical junction within normal limits. Bone marrow signal intensity normal. No focal marrow replacing lesion. No scalp soft tissue  abnormality. Sinuses/Orbits: Globes and orbital soft tissues within normal limits. Scattered mucosal thickening present about the sphenoethmoidal and maxillary sinuses, most pronounced within the right sphenoid sinus. No mastoid effusion. Other: None. IMPRESSION: 1. Few scattered punctate acute ischemic infarcts involving the bilateral cerebral hemispheres. No associated hemorrhage or mass effect. A central thromboembolic etiology is suspected. 2. No evidence for intracranial metastatic disease on this noncontrast examination. 3. Irregular flow void within the hypoplastic right vertebral artery, which could be related to slow flow and/or occlusion. 4. Underlying moderate to advanced cerebral atrophy with chronic microvascular ischemic disease. Electronically Signed   By: Rise Mu M.D.   On: 08/31/2022 19:25   CT CHEST W CONTRAST  Result Date: 08/31/2022 CLINICAL DATA:  Rib fracture suspected.  Non-small cell lung cancer. EXAM: CT CHEST WITH CONTRAST TECHNIQUE: Multidetector CT imaging of the chest was performed during intravenous contrast administration. RADIATION DOSE REDUCTION: This exam was performed according to the departmental dose-optimization program which includes automated exposure control, adjustment of the mA and/or kV according to patient size and/or use of iterative reconstruction technique. CONTRAST:  75mL OMNIPAQUE IOHEXOL 300 MG/ML  SOLN COMPARISON:  Chest x-ray same day.  CT chest 07/04/2022. FINDINGS: Cardiovascular: The ascending aorta is dilated measuring 4.0 x 4.3 cm, unchanged. There is no evidence for dissection. There are atherosclerotic calcifications of the aorta. The heart is normal in size. There is no pericardial effusion. There is no large central pulmonary embolism. Mediastinum/Nodes: No enlarged mediastinal, hilar, or axillary lymph nodes. Thyroid gland, trachea, and esophagus demonstrate no significant findings. Lungs/Pleura: There are new small bilateral pleural  effusions. Right upper lobe/apical cavitary mass appears unchanged from the prior examination extending to the hilum. Right upper lobe bronchus remains patent. Scarring in the right middle lobe is stable. There is no new focal lung infiltrate or pneumothorax. Upper Abdomen: No acute abnormality. Musculoskeletal: Osseous metastatic disease again seen most significant at T3, T8 and T12. T4, T8 and T12 compression  deformities are stable. There is increasing anterior and left paramediastinal low-attenuation density at the L3-L4 level. This measures 3.6 x 1.8 x 3.4 cm on axial image 5/30 and sagittal image 7/73 and has increased compared to prior study. Right anterior L4 rib metastatic lesion is unchanged. IMPRESSION: 1. New small bilateral pleural effusions. 2. Stable right upper lobe/apical cavitary mass extending to the hilum. 3. Stable osseous metastatic disease. 4. Increasing anterior left paramediastinal low-attenuation density at the L3-L4 level. Findings may represent enlarging metastatic lesion or abscess. Consider further evaluation with MRI. Aortic Atherosclerosis (ICD10-I70.0). Electronically Signed   By: Darliss Cheney M.D.   On: 08/31/2022 18:44    Medications: I have reviewed the patient's current medications.   Assessment/Plan: This is a very pleasant 77 years old white female with a stage IV non-small cell lung cancer diagnosed in June 2022 with positive KRAS G12C mutation.  She is status post palliative radiotherapy to painful bone lesions followed by palliative systemic chemotherapy with carboplatin, Alimta and Keytruda for 4 cycles then maintenance treatment for additional 18 cycles discontinued on December 20, 2021 secondary to disease progression.  The patient also underwent palliative radiotherapy to enlarging pulmonary mass.  She has been on treatment with Caryn Section (Adagrasib) 600 mg p.o. daily since January 17, 2022 status post 9 months of treatment.  She has been tolerating this treatment fairly  well except for the increasing fatigue and weakness recently.  Her treatment has been on hold since August 29, 2022 because of the increasing fatigue and weakness. The patient has been doing very well on this treatment except for the recent increase in the T3 paravertebral mass. I recommended for the patient to continue with the palliative radiotherapy to this area under the care of Dr. Roselind Messier. Her imaging studies showed no other concerning metastatic disease except for the paravertebral mass and few suspicious bone metastasis that can be treated with radiation. In the absence of any good additional future treatment, I would recommend for the patient to continue her current treatment with Caryn Section (Adagrasib) after discharge but at a reduced dose of 200 mg p.o. twice daily because of the toxicity. The patient is too weak to go home after this hospitalization and she may benefit from discharge to skilled nursing facility for rehabilitation. I discussed my recommendation with the patient and her husband [name] and they are in agreement with the current plan.  I will arrange for the patient a follow-up appointment with me 1 week after her discharge for more discussion of her condition and treatment options. Thank you for taking good care of Ms. Blizzard.  Please call if you have any questions.   LOS: 3 days    Lajuana Matte 09/02/2022

## 2022-09-02 NOTE — Progress Notes (Signed)
Pt requested zofran po but once opened decided she did not want it, medication thrown out at this time because it had been opened.

## 2022-09-02 NOTE — Progress Notes (Signed)
PROGRESS NOTE    Kristi Jennings  ZCH:[id number] DOB: Mar 12, 1946 DOA: 08/30/2022 PCP: [name], NP   Brief Narrative: Kristi Jennings is a 77 y.o. 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 Patient presented secondary to progressive weakness and fatigue with associated sodium of 123, presumed secondary to her oral chemotherapy and dehydration. Patient started on IV fluids and CT imaging concerning for new metastasis. MRI thoracic and lumbar spine with multiple areas of pathologic fractures in addition to spinal stenosis and soft tissue mass involving the left paravertebral soft tissues at T2-T4 and causing probable cord compression resulting in paraplegia. Emergent radiation therapy started. MRI brain identified multiple punctate infarcts; neurology consulted.   Assessment and Plan:  Symptomatic hyponatremia Presumed secondary to hyponatremia with some improvement after IV fluids. Patient also with metastatic cancer. SIADH would be a possible concern. TSH normal. Urine sodium somewhat high for dehydration; urine osmolality not obtained. Sodium worsened this morning.  -Sodium tablets -Fluid restriction -Repeat serum sodium -Repeat urine sodium/osmolality in AM  Bilateral lower extremity weakness Likely related to thoracic mass involving the spinal canal at T2-T4 with resultant severe spinal stenosis but unclear evidence of cord compression. History of severe cervical cord compression in 2022. CT head with concern for possible brain metastasis, which is ruled out on non-contrast MRI. MRI thoracic/lumbar spine with multiple pathologic compression fractures and severe spinal stenosis noted at L4-5. Weakness has now progressed. Neurosurgery consulted but recommend no surgical  management secondary to poor patient candidacy. Radiation oncology consulted and performed emergent radiation therapy on 4/12. Decadron start. PT/OT recommending SNF. -Radiation oncology recommendations  Chest pain Likely related to known metastatic bony lesions. Involvement of the manubrium.  -Analgesics PRN  Chronic pulmonary embolism -Continue Eliquis  Acute stroke Multiple punctate infarcts noted on MRI. Patient is on Eliquis for pulmonary embolism. No evidence of hemorrhage. LDL of 80. Hemoglobin A1C of 5.2%. Transthoracic Echocardiogram significant for no intracardiac source of embolism or interatrial shunt.  PT/OT recommendations for SNF. -Continue Eliquis  Metastatic non-small cell lung cancer Bone metastasis Right adrenal metastasis Patient follows with Dr. [name]. Recently on oral chemotherapy which was held secondary to hyponatremia and dehydration. -Message sent to primary oncologist for follow-up inpatient  Multiple vertebral compression fractures Noted on prior imaging. Concern would be pathologic fractures from metastasis.  Ascending aortic aneurysm Noted. Blood pressure mostly normotensive.  Prediabetes Noted. Most recent hemoglobin A1C is 5.2%, however.  Primary hypertension -Continue home atenolol  Polymyalgia rheumatica -Continue home gabapentin  Cancer related pain Chronic pain -Continue home MS Contin and dilaudid -Miralax and Senokot-S  Underweight Severe malnutrition Secondary to chronic illness. Estimated body mass index is 15.8 kg/m as calculated from the following:   Height as of this encounter: 5\' 6"  (1.676 m).   Weight as of this encounter: 44.4 kg.  Pressure injury Mid coccyx. Present on admission.   DVT prophylaxis: Eliquis Code Status:   Code Status: Full Code Family Communication: Two friends at bedside Disposition Plan: Discharge to SNF pending continued radiation oncology recommendations   Consultants:   Neurosurgery Neurology Medical oncology Radiation oncology  Procedures:  4/13: Transthoracic Echocardiogram  Antimicrobials: None    Subjective: Patient without issues overnight. She is hopeful she will regain the use of her legs again.  Objective: BP (!) 104/58 (BP Location: Left Arm)   Pulse Marland Kitchen)  57   Temp 98.2 F (36.8 C) (Oral)   Resp 18   Ht 5\' 6"  (1.676 m)   Wt 44.4 kg   SpO2 96%   BMI 15.80 kg/m   Examination:  General exam: Appears calm and comfortable  Respiratory system: Clear to auscultation. Respiratory effort normal. Cardiovascular system: S1 & S2 heard, RRR. Gastrointestinal system: Abdomen is nondistended, soft and nontender. Normal bowel sounds heard. Central nervous system: Alert and oriented. 0/5 strength of bilateral LE Musculoskeletal: No edema. No calf tenderness Skin: No cyanosis. No rashes Psychiatry: Judgement and insight appear normal. Mood & affect appropriate.    Data Reviewed: I have personally reviewed following labs and imaging studies  CBC Lab Results  Component Value Date   WBC 8.9 08/30/2022   RBC 3.55 (L) 08/30/2022   HGB 10.4 (L) 08/30/2022   HCT 31.4 (L) 08/30/2022   MCV 88.5 08/30/2022   MCH 29.3 08/30/2022   PLT 185 08/30/2022   MCHC 33.1 08/30/2022   RDW 13.2 08/30/2022   LYMPHSABS 1.1 08/30/2022   MONOABS 1.4 (H) 08/30/2022   EOSABS 0.1 08/30/2022   BASOSABS 0.0 08/30/2022     Last metabolic panel Lab Results  Component Value Date   NA 121 (L) 09/02/2022   K 4.9 09/02/2022   CL 92 (L) 09/02/2022   CO2 27 09/02/2022   BUN 10 09/02/2022   CREATININE 0.72 09/02/2022   GLUCOSE 132 (H) 09/02/2022   GFRNONAA >60 09/02/2022   CALCIUM 7.7 (L) 09/02/2022   PROT 6.4 (L) 08/30/2022   ALBUMIN 3.3 (L) 08/30/2022   BILITOT 0.7 08/30/2022   ALKPHOS 56 08/30/2022   AST 31 08/30/2022   ALT 16 08/30/2022   ANIONGAP <3 (L) 09/02/2022    GFR: Estimated Creatinine Clearance: 42.6 mL/min (by C-G formula based on SCr  of 0.72 mg/dL).  No results found for this or any previous visit (from the past 240 hour(s)).    Radiology Studies: ECHOCARDIOGRAM COMPLETE  Result Date: 09/02/2022    ECHOCARDIOGRAM REPORT   Patient Name:   Kristi Jennings [facility] Date of Exam: 09/02/2022 Medical Rec #:  [id number]     Height:       66.0 in Accession #:    [id number]    Weight:       97.9 lb Date of Birth:  [birth year]     BSA:          1.476 m Patient Age:    76 years      BP:           118/93 mmHg Patient Gender: F             HR:           53 bpm. Exam Location:  Inpatient Procedure: 2D Echo, Cardiac Doppler and Color Doppler Indications:    Stroke  History:        Patient has no prior history of Echocardiogram examinations.                 Stroke, Arrythmias:Bradycardia; Risk Factors:Hypertension.  Sonographer:    Lucy Antigua Referring Phys: [phone] Lilliemae Fruge A Tyreke Kaeser IMPRESSIONS  1. Left ventricular ejection fraction, by estimation, is 65 to 70%. The left ventricle has normal function. The left ventricle has no regional wall motion abnormalities. Left ventricular diastolic parameters are indeterminate.  2. Right ventricular systolic function is normal. The right ventricular size is normal. There is normal pulmonary artery systolic pressure.  3. The mitral valve is grossly normal. Trivial mitral  valve regurgitation.  4. The aortic valve is tricuspid. There is mild calcification of the aortic valve. There is mild thickening of the aortic valve. Aortic valve regurgitation is mild to moderate. Aortic valve sclerosis/calcification is present, without any evidence of aortic stenosis.  5. The inferior vena cava is normal in size with greater than 50% respiratory variability, suggesting right atrial pressure of 3 mmHg. Conclusion(s)/Recommendation(s): No intracardiac source of embolism detected on this transthoracic study. Consider a transesophageal echocardiogram to exclude cardiac source of embolism if clinically indicated. FINDINGS  Left Ventricle: Left  ventricular ejection fraction, by estimation, is 65 to 70%. The left ventricle has normal function. The left ventricle has no regional wall motion abnormalities. The left ventricular internal cavity size was normal in size. There is  no left ventricular hypertrophy. Left ventricular diastolic parameters are indeterminate. Right Ventricle: The right ventricular size is normal. No increase in right ventricular wall thickness. Right ventricular systolic function is normal. There is normal pulmonary artery systolic pressure. The tricuspid regurgitant velocity is 2.59 m/s, and  with an assumed right atrial pressure of 3 mmHg, the estimated right ventricular systolic pressure is 29.8 mmHg. Left Atrium: Left atrial size was normal in size. Right Atrium: Right atrial size was normal in size. Pericardium: There is no evidence of pericardial effusion. Mitral Valve: The mitral valve is grossly normal. There is mild thickening of the mitral valve leaflet(s). Trivial mitral valve regurgitation. Tricuspid Valve: The tricuspid valve is normal in structure. Tricuspid valve regurgitation is mild. Aortic Valve: The aortic valve is tricuspid. There is mild calcification of the aortic valve. There is mild thickening of the aortic valve. Aortic valve regurgitation is mild to moderate. Aortic valve sclerosis/calcification is present, without any evidence of aortic stenosis. Aortic valve mean gradient measures 2.0 mmHg. Aortic valve peak gradient measures 4.2 mmHg. Aortic valve area, by VTI measures 3.11 cm. Pulmonic Valve: The pulmonic valve was normal in structure. Pulmonic valve regurgitation is trivial. Aorta: The aortic root is normal in size and structure. Venous: The inferior vena cava is normal in size with greater than 50% respiratory variability, suggesting right atrial pressure of 3 mmHg. IAS/Shunts: The atrial septum is grossly normal.  LEFT VENTRICLE PLAX 2D LVIDd:         3.80 cm     Diastology LVIDs:         2.40 cm     LV  e' medial:    5.55 cm/s LV PW:         1.00 cm     LV E/e' medial:  12.4 LV IVS:        0.80 cm     LV e' lateral:   8.38 cm/s LVOT diam:     2.00 cm     LV E/e' lateral: 8.2 LV SV:         73 LV SV Index:   49 LVOT Area:     3.14 cm  LV Volumes (MOD) LV vol d, MOD A2C: 42.0 ml LV vol s, MOD A2C: 16.6 ml LV SV MOD A2C:     25.4 ml RIGHT VENTRICLE RV S prime:     11.60 cm/s TAPSE (M-mode): 1.8 cm LEFT ATRIUM           Index LA Vol (A2C): 38.0 ml 25.74 ml/m LA Vol (A4C): 21.3 ml 14.43 ml/m  AORTIC VALVE AV Area (Vmax):    2.57 cm AV Area (Vmean):   2.81 cm AV Area (VTI):     3.11  cm AV Vmax:           102.00 cm/s AV Vmean:          64.200 cm/s AV VTI:            0.233 m AV Peak Grad:      4.2 mmHg AV Mean Grad:      2.0 mmHg LVOT Vmax:         83.60 cm/s LVOT Vmean:        57.400 cm/s LVOT VTI:          0.231 m LVOT/AV VTI ratio: 0.99  AORTA Ao Root diam: 3.40 cm Ao Asc diam:  3.00 cm MITRAL VALVE               TRICUSPID VALVE MV Area (PHT): 3.31 cm    TR Peak grad:   26.8 mmHg MV Decel Time: 229 msec    TR Vmax:        259.00 cm/s MV E velocity: 68.60 cm/s MV A velocity: 63.40 cm/s  SHUNTS MV E/A ratio:  1.08        Systemic VTI:  0.23 m                            Systemic Diam: 2.00 cm Laurance Flatten MD Electronically signed by Laurance Flatten MD Signature Date/Time: 09/02/2022/12:04:18 PM    Final    MR CERVICAL SPINE WO CONTRAST  Result Date: 09/02/2022 CLINICAL DATA:  Initial evaluation for neck pain, known malignancy. EXAM: MRI CERVICAL SPINE WITHOUT CONTRAST TECHNIQUE: Multiplanar, multisequence MR imaging of the cervical spine was performed. No intravenous contrast was administered. COMPARISON:  Comparison made with prior MRI of the thoracic spine from 08/31/2022 as well as previous MRI of the cervical spine from 01/03/2021. FINDINGS: Alignment: Examination moderately degraded by motion artifact, limiting assessment. Reversal of the normal cervical lordosis, apex at C5. Trace degenerative  anterolisthesis of C3 on C4 and C4 on C5 as well as C7 on T1. 3 mm retrolisthesis of C5 on C6. Vertebrae: Question subtly decreased T1 signal intensity within the C5 and C6 vertebral bodies. While this finding could potentially reflect metastatic involvement, overall appearance is similar as compared to prior study from 2022, and favored to be degenerative in nature. Superimposed 6 mm T2/stir hyperintense lesion within the C5 vertebral body noted, likely present on prior, and favored to reflect an atypical hemangioma. No other convincing evidence for malignancy within the cervical spine. Metastatic involvement involving the upper thoracic spine and spinal canal at T2 extending inferiorly, better seen on prior MRI of the thoracic spine. Underlying bone marrow signal intensity within normal limits. No acute or chronic fracture within the cervical spine. Cord: Normal signal morphology seen within the cervical spinal cord itself. No convincing cord signal changes on this motion degraded exam. Intracanalicular/epidural tumor noted at T2 extending inferiorly, better seen on prior thoracic spine exam. Posterior Fossa, vertebral arteries, paraspinal tissues: Visualized brain and posterior fossa within normal limits. Craniocervical junction normal. Paraspinous soft tissues demonstrate no acute finding. Grossly preserved flow voids seen within the vertebral arteries bilaterally. Disc levels: C2-C3: Minimal disc bulge with endplate spurring. Mild-to-moderate bilateral facet hypertrophy. No spinal stenosis. Moderate left C3 foraminal narrowing. Right neural foramen remains patent. C3-C4: Diffuse disc bulge with bilateral uncovertebral hypertrophy, asymmetric to the right. Resultant broad posterior disc osteophyte complex flattens and partially effaces the ventral thecal sac. Superimposed mild to moderate facet hypertrophy. Resultant mild spinal  stenosis. Severe right worse than left C4 foraminal narrowing. C4-C5: Right  eccentric disc osteophyte complex. Broad posterior component flattens and partially effaces the ventral thecal sac. Superimposed bilateral facet degeneration. Mild spinal stenosis. Severe right worse than left C5 foraminal narrowing. C5-C6: Advanced degenerative intervertebral disc space narrowing with 3 mm retrolisthesis. Associated diffuse disc osteophyte complex with endplate and uncovertebral spurring. Broad posterior component flattens and effaces the ventral thecal sac. Secondary cord flattening without definite cord signal changes. Severe spinal stenosis at this level with the thecal sac measuring approximately 3 mm in AP diameter at its most narrow point (series 8, image 30). Severe bilateral C6 foraminal narrowing. C6-C7: Degenerative intervertebral disc space narrowing with diffuse disc osteophyte complex. Broad posterior component flattens and largely effaces the ventral thecal sac. Secondary cord flattening without definite cord signal changes. Severe spinal stenosis with the thecal sac measuring approximately 4-5 mm in AP diameter at its most narrow point. Severe right with moderate to severe left C7 foraminal narrowing. C7-T1: Negative interspace. Moderate facet hypertrophy. No spinal stenosis. Mild left greater than right C8 foraminal narrowing. IMPRESSION: 1. Motion degraded exam. 2. Subtle T1 hypointense/STIR signal abnormality involving the C5 and C6 vertebral bodies. While this could potentially reflect metastatic disease, overall appearance is similar as compared to prior MRI from 01/03/2021. Given stability, this is therefore favored to be degenerative in nature, although attention at follow-up is recommended. No other visible evidence for metastatic disease within the cervical spine. 3. Metastatic involvement involving the upper thoracic spine and spinal canal at T2 extending inferiorly, better seen on prior MRI of the thoracic spine. 4. Advanced multilevel cervical spondylosis with resultant  severe spinal stenosis at C5-6 and C6-7. Secondary cord flattening at these levels without visible cord signal changes. 5. Multifactorial degenerative changes with resultant multilevel foraminal narrowing as above. Notable findings include moderate left C3 foraminal stenosis, with severe bilateral C4 through C7 foraminal narrowing. Electronically Signed   By: Rise Mu M.D.   On: 09/02/2022 02:21   MR LUMBAR SPINE WO CONTRAST  Result Date: 08/31/2022 CLINICAL DATA:  Initial evaluation for metastatic disease. EXAM: MRI LUMBAR SPINE WITHOUT CONTRAST TECHNIQUE: Multiplanar, multisequence MR imaging of the lumbar spine was performed. No intravenous contrast was administered. COMPARISON:  Prior CT from earlier the same day. FINDINGS: Segmentation: Standard. Lowest well-formed disc space labeled the L5-S1 level. Alignment: Mild exaggeration of the normal lumbar lordosis. Grade 1 stepwise degenerative retrolisthesis of L1 on L2 through L4 on L5. Vertebrae: Compression deformity involving the T12 vertebral body with up to 70% height loss and trace 3 mm bony retropulsion. This is suspected to be pathologic in nature. No significant residual marrow edema. Additional mild benign/mechanical appearing compression deformity noted at the superior endplate of L1. Otherwise, vertebral body height maintained. Bone marrow signal intensity otherwise within normal limits. No other evidence for metastatic disease within the lumbar spine. Conus medullaris and cauda equina: Conus extends to the L2 level. Conus and cauda equina appear normal. No visible epidural involvement of tumor. Paraspinal and other soft tissues: Paraspinous soft tissues demonstrate no acute finding. No paraspinous soft tissue mass or collection. Disc levels: L1-2: Degenerative intervertebral disc space narrowing with diffuse disc bulge and disc desiccation. Mild facet hypertrophy. No more than mild spinal stenosis. Foramina remain patent. L2-3:  Degenerative intervertebral disc space narrowing with diffuse disc bulge and disc desiccation. Mild facet hypertrophy. Resultant mild canal with bilateral lateral recess stenosis, worse on the right. Foramina remain patent. L3-4: Disc bulge with disc  desiccation. Disc bulging asymmetric to the right. Mild to moderate facet hypertrophy. Resultant mild-to-moderate canal with bilateral subarticular stenosis. Moderate bilateral L3 foraminal narrowing. L4-5: Degenerative intervertebral disc space narrowing with diffuse disc bulge and reactive endplate spurring. Moderate facet and ligament flavum hypertrophy. Resultant moderate to severe canal with bilateral subarticular stenosis. Severe bilateral L4 foraminal narrowing. L5-S1: Mild disc bulge with disc desiccation. Moderate facet hypertrophy. Resultant mild narrowing of the lateral recesses bilaterally. Central canal remains patent. No significant foraminal stenosis. IMPRESSION: 1. Compression deformity involving the T12 vertebral body with up to 70% height loss and trace 3 mm bony retropulsion. This is suspected to be pathologic in nature. 2. No other evidence for metastatic disease within the lumbar spine. 3. Multilevel lumbar spondylosis with resultant diffuse spinal stenosis, moderate to severe in nature at L4-5. 4. Moderate bilateral L3 and severe bilateral L4 foraminal stenosis related to disc bulge, reactive endplate spurring, and facet hypertrophy. Electronically Signed   By: Rise Mu M.D.   On: 08/31/2022 19:49   MR THORACIC SPINE WO CONTRAST  Result Date: 08/31/2022 CLINICAL DATA:  Initial evaluation for metastatic disease. EXAM: MRI THORACIC SPINE WITHOUT CONTRAST TECHNIQUE: Multiplanar, multisequence MR imaging of the thoracic spine was performed. No intravenous contrast was administered. COMPARISON:  Prior CT from earlier the same day. FINDINGS: Alignment:  Examination degraded by motion artifact. Straightening of the normal thoracic  kyphosis. No significant listhesis. Vertebrae: Abnormal signal abnormality involving the T2 and T3 vertebral bodies, consistent with osseous metastatic disease. Abnormal paravertebral soft tissue along the left aspect of the vertebral column at the level of T3, likely reflecting metastatic disease. Paravertebral component measures approximately 3.5 x 1.5 x 5.1 cm (series 28). Additionally, there is abnormal soft tissue density filling the spinal canal at the levels of T2 through T4 (series 27, image 10), consistent with epidural extension of tumor. The underlying native cord is somewhat difficult to visualize at this level on this motion degraded exam, with possible intramedullary involvement not excluded. The cord itself appears to be somewhat expanded. No overt cord signal changes seen on this limited exam. Overall, degree of intracanalicular involvement measures approximately 4.3 cm in craniocaudad dimension. There is moderate to severe spinal stenosis at this level. Tumor seen extending into and largely obliterating the T2-3 and T3-4 neural foramina bilaterally. Associated mild pathologic compression fractures at the superior endplates of T2 and T3. Additional compression deformity at the superior endplate of T4 noted, also potentially pathologic. Additional compression deformities involving the T8 and T12 vertebral bodies, favored to be pathologic in nature. Height loss is maximal at T12 measuring up to approximately 70% with no more than trace 2-3 mm bony retropulsion. No significant stenosis at these levels. Elsewhere, bone marrow signal intensity is within normal limits. Vertebral body height otherwise maintained. Cord: Intracanalicular tumor involvement at T2 through T4 as above. Again, no obvious cord signal changes on this limited exam. Otherwise normal signal and morphology. No other epidural involvement of malignancy identified. Paraspinal and other soft tissues: Paravertebral tumor at the levels of  T3-4 as above. This is in close proximity to patient's known right upper lobe mass. Small layering bilateral pleural effusions. No other acute finding. Disc levels: Underlying mild for age multilevel degenerative spondylosis with mild noncompressive disc bulging at T7-8 and T8-9. No other significant spinal stenosis. Moderate bilateral foraminal narrowing noted at T8-9 and T9-10. No other significant stenosis. IMPRESSION: 1. 3.5 x 1.5 x 5.1 cm soft tissue involving the left paravertebral soft tissues at T2  through T4, concerning for metastatic disease. Abnormal signal intensity within the adjacent T2 and T3 vertebral bodies, consistent with osseous involvement. Associated mild pathologic compression fractures at these levels. Additional mild compression deformity at T4, also likely involved. 2. Associated intracanalicular extension of tumor at the levels of T2 through T4 with resultant moderate to severe spinal stenosis. The underlying native cord is difficult to visualize on this motion degraded exam, with possible intramedullary involvement not excluded. No overt cord signal changes seen on this limited exam. 3. Additional compression deformities involving the T8 and T12 vertebral bodies, also likely pathologic. 4. Known right upper lobe mass with small layering bilateral pleural effusions, better evaluated on prior chest CT. 5. Underlying mild for age thoracic spondylosis without other high-grade spinal stenosis. Moderate bilateral foraminal narrowing at T8-9 and T9-10. Electronically Signed   By: Benjamin  McClintock M.D.   On: 08/31/2022 19:41   MR BRAIN WO CONTRAST  Result Date: 08/31/2022 CLINICAL DATA:  Initial evaluation for metastatic disease. EXAM: MRI HEAD WITHOUT CONTRAST TECHNIQUE: Multiplanar, multiecho pulse sequences of the brain and surrounding structures were obtained without intravenous contrast. COMPARISON:  CT from 08/30/2022 as well as prior MRI from 06/28/2022. FINDINGS: Brain: Moderate  to advance cerebral atrophy. Moderately advanced chronic microvascular ischemic disease, similar to prior. Few scattered foci of punctate restricted diffusion are seen involving the bilateral cerebral hemispheres, consistent with small acute ischemic infarcts (series 5, images 41, 40, 38, 32, 29). No associated hemorrhage or mass effect. No other evidence for acute or subacute ischemia. Gray-white matter distinction otherwise maintained. No areas of chronic cortical infarction. No acute or chronic intracranial blood products. No mass lesion or evidence for intracranial metastatic disease on this noncontrast examination. No mass effect or midline shift. No hydrocephalus or extra-axial fluid collection. Pituitary gland and suprasellar region within normal limits. Vascular: Irregular flow void within the hypoplastic right vertebral artery, which could be related to slow flow and/or occlusion. Major intracranial vascular flow voids are otherwise maintained. Skull and upper cervical spine: Craniocervical junction within normal limits. Bone marrow signal intensity normal. No focal marrow replacing lesion. No scalp soft tissue abnormality. Sinuses/Orbits: Globes and orbital soft tissues within normal limits. Scattered mucosal thickening present about the sphenoethmoidal and maxillary sinuses, most pronounced within the right sphenoid sinus. No mastoid effusion. Other: None. IMPRESSION: 1. Few scattered punctate acute ischemic infarcts involving the bilateral cerebral hemispheres. No associated hemorrhage or mass effect. A central thromboembolic etiology is suspected. 2. No evidence for intracranial metastatic disease on this noncontrast examination. 3. Irregular flow void within the hypoplastic right vertebral artery, which could be related to slow flow and/or occlusion. 4. Underlying moderate to advanced cerebral atrophy with chronic microvascular ischemic disease. Electronically Signed   By: Benjamin  McClintock M.D.    On: 08/31/2022 19:25   CT CHEST W CONTRAST  Result Date: 08/31/2022 CLINICAL DATA:  Rib fracture suspected.  Non-small cell lung cancer. EXAM: CT CHEST WITH CONTRAST TECHNIQUE: Multidetector CT imaging of the chest was performed during intravenous contrast administration. RADIATION DOSE REDUCTION: This exam was performed according to the departmental dose-optimization program which includes automated exposure control, adjustment of the mA and/or kV according to patient size and/or use of iterative reconstruction technique. CONTRAST:  50mmL OMNIPAQUE IOHEXOL 300 MG/ML  SOLN COMPARISON:  Chest x-ray same day.  CT chest 07/04/2022. FINDINGS: Cardiovascular: The ascending aorta is dilated measuring 4.0 x 4.3 cm, unchanged. There is no evidence for dissection. There are atherosclerotic calcifications of the aorta. The heart is  normal in size. There is no pericardial effusion. There is no large central pulmonary embolism. Mediastinum/Nodes: No enlarged mediastinal, hilar, or axillary lymph nodes. Thyroid gland, trachea, and esophagus demonstrate no significant findings. Lungs/Pleura: There are new small bilateral pleural effusions. Right upper lobe/apical cavitary mass appears unchanged from the prior examination extending to the hilum. Right upper lobe bronchus remains patent. Scarring in the right middle lobe is stable. There is no new focal lung infiltrate or pneumothorax. Upper Abdomen: No acute abnormality. Musculoskeletal: Osseous metastatic disease again seen most significant at T3, T8 and T12. T4, T8 and T12 compression deformities are stable. There is increasing anterior and left paramediastinal low-attenuation density at the L3-L4 level. This measures 3.6 x 1.8 x 3.4 cm on axial image 5/30 and sagittal image 7/73 and has increased compared to prior study. Right anterior L4 rib metastatic lesion is unchanged. IMPRESSION: 1. New small bilateral pleural effusions. 2. Stable right upper lobe/apical cavitary mass  extending to the hilum. 3. Stable osseous metastatic disease. 4. Increasing anterior left paramediastinal low-attenuation density at the L3-L4 level. Findings may represent enlarging metastatic lesion or abscess. Consider further evaluation with MRI. Aortic Atherosclerosis (ICD10-I70.0). Electronically Signed   By: Darliss Cheney M.D.   On: 08/31/2022 18:44      LOS: 3 days    Jacquelin Hawking, MD [facility] 09/02/2022, 2:47 PM   If 7PM-7AM, please contact night-coverage [email or web address]

## 2022-09-03 DIAGNOSIS — E871 Hypo-osmolality and hyponatremia: Secondary | ICD-10-CM | POA: Diagnosis not present

## 2022-09-03 DIAGNOSIS — R29898 Other symptoms and signs involving the musculoskeletal system: Secondary | ICD-10-CM | POA: Diagnosis not present

## 2022-09-03 DIAGNOSIS — I639 Cerebral infarction, unspecified: Secondary | ICD-10-CM | POA: Diagnosis not present

## 2022-09-03 LAB — BASIC METABOLIC PANEL
Anion gap: 8 (ref 5–15)
BUN: 17 mg/dL (ref 8–23)
CO2: 26 mmol/L (ref 22–32)
Calcium: 8.2 mg/dL — ABNORMAL LOW (ref 8.9–10.3)
Chloride: 92 mmol/L — ABNORMAL LOW (ref 98–111)
Creatinine, Ser: 0.75 mg/dL (ref 0.44–1.00)
GFR, Estimated: >60 mL/min (ref >60)
Glucose, Bld: 151 mg/dL — ABNORMAL HIGH (ref 70–99)
Potassium: 4.7 mmol/L (ref 3.5–5.1)
Sodium: 126 mmol/L — ABNORMAL LOW (ref 135–145)

## 2022-09-03 LAB — CBC
HCT: 34.5 % — ABNORMAL LOW (ref 36.0–46.0)
Hemoglobin: 11.6 g/dL — ABNORMAL LOW (ref 12.0–15.0)
MCH: 30.1 pg (ref 26.0–34.0)
MCHC: 33.6 g/dL (ref 30.0–36.0)
MCV: 89.6 fL (ref 80.0–100.0)
Platelets: 244 10*3/uL (ref 150–400)
RBC: 3.85 MIL/uL — ABNORMAL LOW (ref 3.87–5.11)
RDW: 13 % (ref 11.5–15.5)
WBC: 14.3 10*3/uL — ABNORMAL HIGH (ref 4.0–10.5)
nRBC: 0 % (ref 0.0–0.2)

## 2022-09-03 LAB — GLUCOSE, CAPILLARY
Glucose-Capillary: 136 mg/dL — ABNORMAL HIGH (ref 70–99)
Glucose-Capillary: 172 mg/dL — ABNORMAL HIGH (ref 70–99)
Glucose-Capillary: 207 mg/dL — ABNORMAL HIGH (ref 70–99)
Glucose-Capillary: 169 mg/dL — ABNORMAL HIGH (ref 70–99)

## 2022-09-03 LAB — OSMOLALITY, URINE: Osmolality, Ur: 539 mOsm/kg (ref 300–900)

## 2022-09-03 LAB — SODIUM: Sodium: 125 mmol/L — ABNORMAL LOW (ref 135–145)

## 2022-09-03 MED ORDER — POLYVINYL ALCOHOL 1.4 % OP SOLN
1.0000 [drp] | OPHTHALMIC | Status: DC | PRN
  Filled 2022-09-03: qty 15

## 2022-09-03 MED ORDER — LORAZEPAM 0.5 MG PO TABS
0.5000 mg | ORAL_TABLET | ORAL | Status: DC | PRN
  Administered 2022-09-04 – 2022-09-10 (×19): 0.5 mg via ORAL
  Filled 2022-09-03 (×19): qty 1

## 2022-09-03 MED ORDER — ORAL CARE MOUTH RINSE: 15.0000 mL | OROMUCOSAL | Status: DC | PRN

## 2022-09-03 NOTE — Progress Notes (Signed)
Patient had minimal UOP with some urine incontinence since 7A. Bladder scanner showed greater than . 16 Fr foley inserted by Shearon Stalls RN with 1350 of clear amber urine out to foley. Patient was unable to feel the catheter insertion and tolerated well.

## 2022-09-03 NOTE — Progress Notes (Signed)
PROGRESS NOTE    Kristi Jennings  ZOX:[id number] DOB: 1945/07/23 DOA: 08/30/2022 PCP: [name], NP   Brief Narrative: Kristi Jennings is a 77 y.o. 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 Patient presented secondary to progressive weakness and fatigue with associated sodium of 123, presumed secondary to her oral chemotherapy and dehydration. Patient started on IV fluids and CT imaging concerning for new metastasis. MRI thoracic and lumbar spine with multiple areas of pathologic fractures in addition to spinal stenosis and soft tissue mass involving the left paravertebral soft tissues at T2-T4 and causing probable cord compression resulting in paraplegia. Emergent radiation therapy started. MRI brain identified multiple punctate infarcts; neurology consulted.   Assessment and Plan:  Symptomatic hyponatremia Presumed secondary to hyponatremia with some improvement after IV fluids. Patient also with metastatic cancer. SIADH would be a possible concern. TSH normal. Urine sodium somewhat high for dehydration; urine osmolality not obtained. Sodium improving with sodium tablets and fluid restriction -Sodium tablets -Fluid restriction  Bilateral lower extremity weakness Likely related to thoracic mass involving the spinal canal at T2-T4 with resultant severe spinal stenosis but unclear evidence of cord compression. History of severe cervical cord compression in 2022. CT head with concern for possible brain metastasis, which is ruled out on non-contrast MRI. MRI thoracic/lumbar spine with multiple pathologic compression fractures and severe spinal stenosis noted at L4-5. Weakness has now progressed. Neurosurgery consulted but recommend no surgical management secondary to poor patient  candidacy. Radiation oncology consulted and performed emergent radiation therapy on 4/12. Decadron start. PT/OT recommending SNF. -Radiation oncology recommendations  Chest pain Likely related to known metastatic bony lesions. Involvement of the manubrium.  -Analgesics PRN  Chronic pulmonary embolism -Continue Eliquis  Acute stroke Multiple punctate infarcts noted on MRI. Patient is on Eliquis for pulmonary embolism. No evidence of hemorrhage. LDL of 80. Hemoglobin A1C of 5.2%. Transthoracic Echocardiogram significant for no intracardiac source of embolism or interatrial shunt.  PT/OT recommendations for SNF. -Continue Eliquis  Metastatic non-small cell lung cancer Bone metastasis Right adrenal metastasis Patient follows with Dr. [name]. Recently on oral chemotherapy which was held secondary to hyponatremia and dehydration. Patient seen by her primary oncologist.  Multiple vertebral compression fractures Noted on prior imaging. Concern would be pathologic fractures from metastasis.  Ascending aortic aneurysm Noted. Blood pressure mostly normotensive.  Prediabetes Noted. Most recent hemoglobin A1C is 5.2%, however.  Primary hypertension -Continue home atenolol  Polymyalgia rheumatica -Continue home gabapentin  Cancer related pain Chronic pain -Continue home MS Contin and dilaudid -Miralax and Senokot-S  Underweight Severe malnutrition Secondary to chronic illness. Estimated body mass index is 16.72 kg/m as calculated from the following:   Height as of this encounter:  (1.676 m).   Weight as of this encounter: 47 kg.  Pressure injury Mid coccyx. Present on admission.   DVT prophylaxis: Eliquis Code Status:   Code Status: Full Code Family Communication: None at bedside Disposition Plan: Discharge to SNF pending continued radiation oncology recommendations   Consultants:  Neurosurgery Neurology Medical oncology Radiation oncology  Procedures:  4/13:  Transthoracic Echocardiogram  Antimicrobials: None    Subjective: Patient reports having poor memory recently. She is very stressed about the fact that she cannot remember well  Objective: BP (!) 153/86 (BP Location: Right Arm)   Pulse 66   Temp 97.8 F (36.6  C) (Oral)   Resp 20   Ht 5\' 6"  (1.676 m)   Wt 47 kg   SpO2 100%   BMI 16.72 kg/m   Examination:  General exam: Appears anxious Respiratory system: Clear to auscultation. Respiratory effort normal. Cardiovascular system: S1 & S2 heard, RRR.  Gastrointestinal system: Abdomen is nondistended, soft and nontender. Central nervous system: Alert and oriented. Bilateral LE strength of 0+ Skin: No cyanosis. No rashes Psychiatry: Judgement and insight appear normal. Mood & affect appropriate.    Data Reviewed: I have personally reviewed following labs and imaging studies  CBC Lab Results  Component Value Date   WBC 14.3 (H) 09/03/2022   RBC 3.85 (L) 09/03/2022   HGB 11.6 (L) 09/03/2022   HCT 34.5 (L) 09/03/2022   MCV 89.6 09/03/2022   MCH 30.1 09/03/2022   PLT 244 09/03/2022   MCHC 33.6 09/03/2022   RDW 13.0 09/03/2022   LYMPHSABS 1.1 08/30/2022   MONOABS 1.4 (H) 08/30/2022   EOSABS 0.1 08/30/2022   BASOSABS 0.0 08/30/2022     Last metabolic panel Lab Results  Component Value Date   NA 126 (L) 09/03/2022   K 4.7 09/03/2022   CL 92 (L) 09/03/2022   CO2 26 09/03/2022   BUN 17 09/03/2022   CREATININE 0.75 09/03/2022   GLUCOSE 151 (H) 09/03/2022   GFRNONAA >60 09/03/2022   CALCIUM 8.2 (L) 09/03/2022   PROT 6.4 (L) 08/30/2022   ALBUMIN 3.3 (L) 08/30/2022   BILITOT 0.7 08/30/2022   ALKPHOS 56 08/30/2022   AST 31 08/30/2022   ALT 16 08/30/2022   ANIONGAP 8 09/03/2022    GFR: Estimated Creatinine Clearance: 45.1 mL/min (by C-G formula based on SCr of 0.75 mg/dL).  No results found for this or any previous visit (from the past 240 hour(s)).    Radiology Studies: ECHOCARDIOGRAM COMPLETE  Result  Date: 09/02/2022    ECHOCARDIOGRAM REPORT   Patient Name:   Kristi Jennings [facility] Date of Exam: 09/02/2022 Medical Rec #:  [id number]     Height:       66.0 in Accession #:    [id number]    Weight:       97.9 lb Date of Birth:  [birth year]     BSA:          1.476 m Patient Age:    76 years      BP:           118/93 mmHg Patient Gender: F             HR:           53 bpm. Exam Location:  Inpatient Procedure: 2D Echo, Cardiac Doppler and Color Doppler Indications:    Stroke  History:        Patient has no prior history of Echocardiogram examinations.                 Stroke, Arrythmias:Bradycardia; Risk Factors:Hypertension.  Sonographer:    Lucy Antigua Referring Phys: [phone] Keenon Leitzel A Alawna Graybeal IMPRESSIONS  1. Left ventricular ejection fraction, by estimation, is 65 to 70%. The left ventricle has normal function. The left ventricle has no regional wall motion abnormalities. Left ventricular diastolic parameters are indeterminate.  2. Right ventricular systolic function is normal. The right ventricular size is normal. There is normal pulmonary artery systolic pressure.  3. The mitral valve is grossly normal. Trivial mitral valve regurgitation.  4. The aortic valve is tricuspid. There is mild calcification of the aortic valve. There is  mild thickening of the aortic valve. Aortic valve regurgitation is mild to moderate. Aortic valve sclerosis/calcification is present, without any evidence of aortic stenosis.  5. The inferior vena cava is normal in size with greater than 50% respiratory variability, suggesting right atrial pressure of 3 mmHg. Conclusion(s)/Recommendation(s): No intracardiac source of embolism detected on this transthoracic study. Consider a transesophageal echocardiogram to exclude cardiac source of embolism if clinically indicated. FINDINGS  Left Ventricle: Left ventricular ejection fraction, by estimation, is 65 to 70%. The left ventricle has normal function. The left ventricle has no regional wall motion abnormalities.  The left ventricular internal cavity size was normal in size. There is  no left ventricular hypertrophy. Left ventricular diastolic parameters are indeterminate. Right Ventricle: The right ventricular size is normal. No increase in right ventricular wall thickness. Right ventricular systolic function is normal. There is normal pulmonary artery systolic pressure. The tricuspid regurgitant velocity is 2.59 m/s, and  with an assumed right atrial pressure of 3 mmHg, the estimated right ventricular systolic pressure is 29.8 mmHg. Left Atrium: Left atrial size was normal in size. Right Atrium: Right atrial size was normal in size. Pericardium: There is no evidence of pericardial effusion. Mitral Valve: The mitral valve is grossly normal. There is mild thickening of the mitral valve leaflet(s). Trivial mitral valve regurgitation. Tricuspid Valve: The tricuspid valve is normal in structure. Tricuspid valve regurgitation is mild. Aortic Valve: The aortic valve is tricuspid. There is mild calcification of the aortic valve. There is mild thickening of the aortic valve. Aortic valve regurgitation is mild to moderate. Aortic valve sclerosis/calcification is present, without any evidence of aortic stenosis. Aortic valve mean gradient measures 2.0 mmHg. Aortic valve peak gradient measures 4.2 mmHg. Aortic valve area, by VTI measures 3.11 cm. Pulmonic Valve: The pulmonic valve was normal in structure. Pulmonic valve regurgitation is trivial. Aorta: The aortic root is normal in size and structure. Venous: The inferior vena cava is normal in size with greater than 50% respiratory variability, suggesting right atrial pressure of 3 mmHg. IAS/Shunts: The atrial septum is grossly normal.  LEFT VENTRICLE PLAX 2D LVIDd:         3.80 cm     Diastology LVIDs:         2.40 cm     LV e' medial:    5.55 cm/s LV PW:         1.00 cm     LV E/e' medial:  12.4 LV IVS:        0.80 cm     LV e' lateral:   8.38 cm/s LVOT diam:     2.00 cm     LV E/e'  lateral: 8.2 LV SV:         73 LV SV Index:   49 LVOT Area:     3.14 cm  LV Volumes (MOD) LV vol d, MOD A2C: 42.0 ml LV vol s, MOD A2C: 16.6 ml LV SV MOD A2C:     25.4 ml RIGHT VENTRICLE RV S prime:     11.60 cm/s TAPSE (M-mode): 1.8 cm LEFT ATRIUM           Index LA Vol (A2C): 38.0 ml 25.74 ml/m LA Vol (A4C): 21.3 ml 14.43 ml/m  AORTIC VALVE AV Area (Vmax):    2.57 cm AV Area (Vmean):   2.81 cm AV Area (VTI):     3.11 cm AV Vmax:           102.00 cm/s AV Vmean:  64.200 cm/s AV VTI:            0.233 m AV Peak Grad:      4.2 mmHg AV Mean Grad:      2.0 mmHg LVOT Vmax:         83.60 cm/s LVOT Vmean:        57.400 cm/s LVOT VTI:          0.231 m LVOT/AV VTI ratio: 0.99  AORTA Ao Root diam: 3.40 cm Ao Asc diam:  3.00 cm MITRAL VALVE               TRICUSPID VALVE MV Area (PHT): 3.31 cm    TR Peak grad:   26.8 mmHg MV Decel Time: 229 msec    TR Vmax:        259.00 cm/s MV E velocity: 68.60 cm/s MV A velocity: 63.40 cm/s  SHUNTS MV E/A ratio:  1.08        Systemic VTI:  0.23 m                            Systemic Diam: 2.00 cm Laurance Flatten MD Electronically signed by Laurance Flatten MD Signature Date/Time: 09/02/2022/12:04:18 PM    Final    MR CERVICAL SPINE WO CONTRAST  Result Date: 09/02/2022 CLINICAL DATA:  Initial evaluation for neck pain, known malignancy. EXAM: MRI CERVICAL SPINE WITHOUT CONTRAST TECHNIQUE: Multiplanar, multisequence MR imaging of the cervical spine was performed. No intravenous contrast was administered. COMPARISON:  Comparison made with prior MRI of the thoracic spine from 08/31/2022 as well as previous MRI of the cervical spine from 01/03/2021. FINDINGS: Alignment: Examination moderately degraded by motion artifact, limiting assessment. Reversal of the normal cervical lordosis, apex at C5. Trace degenerative anterolisthesis of C3 on C4 and C4 on C5 as well as C7 on T1. 3 mm retrolisthesis of C5 on C6. Vertebrae: Question subtly decreased T1 signal intensity within the C5  and C6 vertebral bodies. While this finding could potentially reflect metastatic involvement, overall appearance is similar as compared to prior study from 2022, and favored to be degenerative in nature. Superimposed 6 mm T2/stir hyperintense lesion within the C5 vertebral body noted, likely present on prior, and favored to reflect an atypical hemangioma. No other convincing evidence for malignancy within the cervical spine. Metastatic involvement involving the upper thoracic spine and spinal canal at T2 extending inferiorly, better seen on prior MRI of the thoracic spine. Underlying bone marrow signal intensity within normal limits. No acute or chronic fracture within the cervical spine. Cord: Normal signal morphology seen within the cervical spinal cord itself. No convincing cord signal changes on this motion degraded exam. Intracanalicular/epidural tumor noted at T2 extending inferiorly, better seen on prior thoracic spine exam. Posterior Fossa, vertebral arteries, paraspinal tissues: Visualized brain and posterior fossa within normal limits. Craniocervical junction normal. Paraspinous soft tissues demonstrate no acute finding. Grossly preserved flow voids seen within the vertebral arteries bilaterally. Disc levels: C2-C3: Minimal disc bulge with endplate spurring. Mild-to-moderate bilateral facet hypertrophy. No spinal stenosis. Moderate left C3 foraminal narrowing. Right neural foramen remains patent. C3-C4: Diffuse disc bulge with bilateral uncovertebral hypertrophy, asymmetric to the right. Resultant broad posterior disc osteophyte complex flattens and partially effaces the ventral thecal sac. Superimposed mild to moderate facet hypertrophy. Resultant mild spinal stenosis. Severe right worse than left C4 foraminal narrowing. C4-C5: Right eccentric disc osteophyte complex. Broad posterior component flattens and partially effaces the ventral thecal sac.  Superimposed bilateral facet degeneration. Mild spinal  stenosis. Severe right worse than left C5 foraminal narrowing. C5-C6: Advanced degenerative intervertebral disc space narrowing with 3 mm retrolisthesis. Associated diffuse disc osteophyte complex with endplate and uncovertebral spurring. Broad posterior component flattens and effaces the ventral thecal sac. Secondary cord flattening without definite cord signal changes. Severe spinal stenosis at this level with the thecal sac measuring approximately 3 mm in AP diameter at its most narrow point (series 8, image 30). Severe bilateral C6 foraminal narrowing. C6-C7: Degenerative intervertebral disc space narrowing with diffuse disc osteophyte complex. Broad posterior component flattens and largely effaces the ventral thecal sac. Secondary cord flattening without definite cord signal changes. Severe spinal stenosis with the thecal sac measuring approximately 4-5 mm in AP diameter at its most narrow point. Severe right with moderate to severe left C7 foraminal narrowing. C7-T1: Negative interspace. Moderate facet hypertrophy. No spinal stenosis. Mild left greater than right C8 foraminal narrowing. IMPRESSION: 1. Motion degraded exam. 2. Subtle T1 hypointense/STIR signal abnormality involving the C5 and C6 vertebral bodies. While this could potentially reflect metastatic disease, overall appearance is similar as compared to prior MRI from 01/03/2021. Given stability, this is therefore favored to be degenerative in nature, although attention at follow-up is recommended. No other visible evidence for metastatic disease within the cervical spine. 3. Metastatic involvement involving the upper thoracic spine and spinal canal at T2 extending inferiorly, better seen on prior MRI of the thoracic spine. 4. Advanced multilevel cervical spondylosis with resultant severe spinal stenosis at C5-6 and C6-7. Secondary cord flattening at these levels without visible cord signal changes. 5. Multifactorial degenerative changes with  resultant multilevel foraminal narrowing as above. Notable findings include moderate left C3 foraminal stenosis, with severe bilateral C4 through C7 foraminal narrowing. Electronically Signed   By: Rise Mu M.D.   On: 09/02/2022 02:21      LOS: 4 days    Jacquelin Hawking, MD [facility] 09/03/2022, 11:23 AM   If 7PM-7AM, please contact night-coverage [email or web address]

## 2022-09-03 NOTE — Plan of Care (Signed)
Neurology plan of care  Stroke workup completed. Vascular imaging is not felt to be indicated given that she is already on eliquis and would not be a surgical candidate if a carotid lesion were found since she has stage IV lung cancer receiving palliative radiation.  She is undergoing XRT for thoracic spine mets. Please see consult note from 4/11 for discussion of why surgical intervention was not recommended. MRI c spine over the weekend did not show any definitive metastases.  Continue eliquis. Neurology will be available prn for questions going forward.  Bing Neighbors, MD [facility] [phone]  If 7pm- 7am, please page neurology on call as listed in AMION.

## 2022-09-04 ENCOUNTER — Ambulatory Visit
Admit: 2022-09-04 | Discharge: 2022-09-04 | Disposition: A | Discharge status: Home / Self Care | Payer: Medicare HMO | Attending: Radiation Oncology | Admitting: Radiation Oncology

## 2022-09-04 ENCOUNTER — Inpatient Hospital Stay: Payer: Medicare HMO

## 2022-09-04 ENCOUNTER — Telehealth: Payer: Self-pay

## 2022-09-04 ENCOUNTER — Other Ambulatory Visit: Payer: Self-pay

## 2022-09-04 ENCOUNTER — Inpatient Hospital Stay: Payer: Medicare HMO | Admitting: Internal Medicine

## 2022-09-04 DIAGNOSIS — E871 Hypo-osmolality and hyponatremia: Secondary | ICD-10-CM | POA: Diagnosis not present

## 2022-09-04 LAB — BASIC METABOLIC PANEL
Anion gap: 7 (ref 5–15)
BUN: 17 mg/dL (ref 8–23)
CO2: 26 mmol/L (ref 22–32)
Calcium: 7.9 mg/dL — ABNORMAL LOW (ref 8.9–10.3)
Chloride: 92 mmol/L — ABNORMAL LOW (ref 98–111)
Creatinine, Ser: 0.69 mg/dL (ref 0.44–1.00)
GFR, Estimated: >60 mL/min (ref >60)
Glucose, Bld: 128 mg/dL — ABNORMAL HIGH (ref 70–99)
Potassium: 4.6 mmol/L (ref 3.5–5.1)
Sodium: 125 mmol/L — ABNORMAL LOW (ref 135–145)

## 2022-09-04 LAB — RAD ONC ARIA SESSION SUMMARY
Course Elapsed Days: 3
Plan Fractions Treated to Date: 1
Plan Prescribed Dose Per Fraction: 3 Gy
Plan Total Fractions Prescribed: 9
Plan Total Prescribed Dose: 27 Gy
Reference Point Dosage Given to Date: 3 Gy
Reference Point Session Dosage Given: 3 Gy
Session Number: 2

## 2022-09-04 LAB — CBC
HCT: 31 % — ABNORMAL LOW (ref 36.0–46.0)
Hemoglobin: 10.3 g/dL — ABNORMAL LOW (ref 12.0–15.0)
MCH: 29.3 pg (ref 26.0–34.0)
MCHC: 33.2 g/dL (ref 30.0–36.0)
MCV: 88.3 fL (ref 80.0–100.0)
Platelets: 198 10*3/uL (ref 150–400)
RBC: 3.51 MIL/uL — ABNORMAL LOW (ref 3.87–5.11)
RDW: 12.9 % (ref 11.5–15.5)
WBC: 13.3 10*3/uL — ABNORMAL HIGH (ref 4.0–10.5)
nRBC: 0 % (ref 0.0–0.2)

## 2022-09-04 LAB — GLUCOSE, CAPILLARY
Glucose-Capillary: 142 mg/dL — ABNORMAL HIGH (ref 70–99)
Glucose-Capillary: 178 mg/dL — ABNORMAL HIGH (ref 70–99)
Glucose-Capillary: 117 mg/dL — ABNORMAL HIGH (ref 70–99)
Glucose-Capillary: 154 mg/dL — ABNORMAL HIGH (ref 70–99)

## 2022-09-04 LAB — SODIUM, URINE, RANDOM: Sodium, Ur: 61 mmol/L

## 2022-09-04 LAB — OSMOLALITY, URINE: Osmolality, Ur: 617 mOsm/kg (ref 300–900)

## 2022-09-04 MED ORDER — CHLORHEXIDINE GLUCONATE CLOTH 2 % EX PADS
6.0000 | MEDICATED_PAD | Freq: Every day | CUTANEOUS | Status: DC
  Administered 2022-09-04 – 2022-09-11 (×7): 6 via TOPICAL

## 2022-09-04 NOTE — Progress Notes (Signed)
Inpatient Rehab Admissions Coordinator:   Per MD request, pt was screened for CIR candidacy by Estill Dooms, PT, DPT.  Pt does not appear to be able to tolerate intensity of CIR at this time.  Agree with therapy recommendations for SNF.   Estill Dooms, PT, DPT Admissions Coordinator [phone] 09/04/22  1:07 PM

## 2022-09-04 NOTE — Progress Notes (Addendum)
PROGRESS NOTE    Kristi Jennings  SUP:[id number] DOB: Apr 19, 1946 DOA: 08/30/2022 PCP: [name], NP   Brief Narrative: Kristi Jennings is a 77 y.o. 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 Patient presented secondary to progressive weakness and fatigue with associated sodium of 123, presumed secondary to her oral chemotherapy and dehydration. Patient started on IV fluids and CT imaging concerning for new metastasis. MRI thoracic and lumbar spine with multiple areas of pathologic fractures in addition to spinal stenosis and soft tissue mass involving the left paravertebral soft tissues at T2-T4 and causing probable cord compression resulting in paraplegia. Emergent radiation therapy started. MRI brain identified multiple punctate infarcts; neurology consulted. Patient started emergent XRT on 4/12.   Assessment and Plan:  Symptomatic hyponatremia Presumed secondary to hyponatremia with some improvement after IV fluids. Patient also with metastatic cancer. SIADH would be a possible concern. TSH normal. Urine sodium somewhat high for dehydration; urine osmolality not obtained. Sodium improved and has stalled with sodium tablets and fluid restriction -Sodium tablets -Fluid restriction, decrease to 1200 mL  Bilateral lower extremity weakness Likely related to thoracic mass involving the spinal canal at T2-T4 with resultant severe spinal stenosis but unclear evidence of cord compression. History of severe cervical cord compression in 2022. CT head with concern for possible brain metastasis, which is ruled out on non-contrast MRI. MRI thoracic/lumbar spine with multiple pathologic compression fractures and severe spinal stenosis noted at L4-5. Weakness has now progressed. Neurosurgery  consulted but recommend no surgical management secondary to poor patient candidacy. Radiation oncology consulted and performed emergent radiation therapy on 4/12. Decadron start. PT/OT recommending SNF. -Radiation oncology recommendations: XRT (4/12>>  Chest pain Likely related to known metastatic bony lesions. Involvement of the manubrium.  -Analgesics PRN  Chronic pulmonary embolism -Continue Eliquis  Acute stroke Multiple punctate infarcts noted on MRI. Patient is on Eliquis for pulmonary embolism. No evidence of hemorrhage. LDL of 80. Hemoglobin A1C of 5.2%. Transthoracic Echocardiogram significant for no intracardiac source of embolism or interatrial shunt.  PT/OT recommendations for SNF. -Continue Eliquis  Metastatic non-small cell lung cancer Bone metastasis Right adrenal metastasis Patient follows with Dr. [name]. Recently on oral chemotherapy which was held secondary to hyponatremia and dehydration. Patient seen by her primary oncologist.  Multiple vertebral compression fractures Noted on prior imaging. Likely pathologic fractures from metastasis.  Ascending aortic aneurysm Noted. Blood pressure mostly normotensive.  Prediabetes Noted. Most recent hemoglobin A1C is 5.2%, however.  Primary hypertension -Continue home atenolol  Polymyalgia rheumatica -Continue home gabapentin  Cancer related pain Chronic pain -Continue home MS Contin and dilaudid -Miralax and Senokot-S  Underweight Severe malnutrition Secondary to chronic illness. Estimated body mass index is 17.22 kg/m as calculated from the following:   Height as of this encounter: 5\' 6"  (1.676 m).   Weight as of this encounter: 48.4 kg.  Pressure injury Mid coccyx. Present on admission.   DVT prophylaxis: Eliquis Code Status:   Code Status: Full Code Family Communication: None at bedside. Husband on telephone at bedside Disposition Plan: Discharge to SNF pending continued radiation oncology  recommendations   Consultants:  Neurosurgery Neurology Medical oncology Radiation oncology  Procedures:  4/13: Transthoracic Echocardiogram  Antimicrobials: None    Subjective: Patient tolerated XRT well this morning. Motivated to improve her functional abilities. Optimistic that she will regain function of  her legs.  Objective: BP (!) 153/85 (BP Location: Left Arm)   Pulse 71   Temp 97.8 F (36.6 C) (Oral)   Resp 14   Ht 5\' 6"  (1.676 m)   Wt 48.4 kg   SpO2 97%   BMI 17.22 kg/m   Examination:  General exam: Appears calm and comfortable Respiratory system: Clear to auscultation. Respiratory effort normal. Cardiovascular system: S1 & S2 heard, RRR. Gastrointestinal system: Abdomen is nondistended, soft and nontender. No organomegaly or masses felt. Normal bowel sounds heard. Central nervous system: Alert and oriented. 0/5 BLE strength Musculoskeletal: No edema. No calf tenderness Psychiatry: Judgement and insight appear normal. Mood & affect appropriate.    Data Reviewed: I have personally reviewed following labs and imaging studies  CBC Lab Results  Component Value Date   WBC 13.3 (H) 09/04/2022   RBC 3.51 (L) 09/04/2022   HGB 10.3 (L) 09/04/2022   HCT 31.0 (L) 09/04/2022   MCV 88.3 09/04/2022   MCH 29.3 09/04/2022   PLT 198 09/04/2022   MCHC 33.2 09/04/2022   RDW 12.9 09/04/2022   LYMPHSABS 1.1 08/30/2022   MONOABS 1.4 (H) 08/30/2022   EOSABS 0.1 08/30/2022   BASOSABS 0.0 08/30/2022     Last metabolic panel Lab Results  Component Value Date   NA 125 (L) 09/04/2022   K 4.6 09/04/2022   CL 92 (L) 09/04/2022   CO2 26 09/04/2022   BUN 17 09/04/2022   CREATININE 0.69 09/04/2022   GLUCOSE 128 (H) 09/04/2022   GFRNONAA >60 09/04/2022   CALCIUM 7.9 (L) 09/04/2022   PROT 6.4 (L) 08/30/2022   ALBUMIN 3.3 (L) 08/30/2022   BILITOT 0.7 08/30/2022   ALKPHOS 56 08/30/2022   AST 31 08/30/2022   ALT 16 08/30/2022   ANIONGAP 7 09/04/2022     GFR: Estimated Creatinine Clearance: 46.4 mL/min (by C-G formula based on SCr of 0.69 mg/dL).  No results found for this or any previous visit (from the past 240 hour(s)).    Radiology Studies: No results found.    LOS: 5 days    Jacquelin Hawking, MD [facility] 09/04/2022, 1:58 PM   If 7PM-7AM, please contact night-coverage [email or web address]

## 2022-09-04 NOTE — Plan of Care (Signed)
  Problem: Clinical Measurements: Goal: Respiratory complications will improve Outcome: Progressing Goal: Cardiovascular complication will be avoided Outcome: Progressing   Problem: Skin Integrity: Goal: Risk for impaired skin integrity will decrease Outcome: Progressing   

## 2022-09-04 NOTE — Telephone Encounter (Signed)
Pt husnad craig called, provided the name of case manager assigned to pt, reviewed SNFs in area, no further needs at this time.

## 2022-09-04 NOTE — Progress Notes (Signed)
Physical Therapy Treatment Patient Details Name: Kristi Jennings MRN: [id number] DOB: 13-Jun-1945 Today's Date: 09/04/2022   History of Present Illness 77 y.o. female with medical history significant of  pre diabetes, HLD, Hypertension, Polymyalgia rheumatica , Pulmonary emboli on OAC,  Ascending aortic aneurysm,DJD of the spine, Stage IV (T3, N2, M1 C) non-small cell lung cancer with bone and right adrenal metastasis followed by Dr. [name] s/p radiation , palliative chemo s/p 18 cycles . Of note patient last chemo cycle was 12/2020, treatment stopped due to disease progression  Patient has had increasing weakness  and fatigue at home x 1 week with associated muscle twitching. Dx of hyponatremia, imaging shows likely brain mets, LLE paresis, RLE weakness.    PT Comments    Assisted pt to do PROM bed exercises; log rolls, heel slides, bridges. Instructed and Vcs to do pursed lip breathing. Pt c/o of being "suffocated", monitored O2 sats throughout session, O2 sats consistently above 90%. Pt nervous about sensation of not being able to get in "full breath".     Recommendations for follow up therapy are one component of a multi-disciplinary discharge planning process, led by the attending physician.  Recommendations may be updated based on patient status, additional functional criteria and insurance authorization.  Follow Up Recommendations  Can patient physically be transported by private vehicle: No    Assistance Recommended at Discharge Frequent or constant Supervision/Assistance  Patient can return home with the following Two people to help with walking and/or transfers;A lot of help with bathing/dressing/bathroom;Assistance with cooking/housework;Direct supervision/assist for medications management;Assist for transportation;Help with stairs or ramp for entrance;Direct supervision/assist for financial management   Equipment Recommendations  Wheelchair (measurements PT);Wheelchair cushion  (measurements PT)    Recommendations for Other Services OT consult     Precautions / Restrictions Precautions Precautions: Fall Precaution Comments: fell on day of admission, denies other falls in past 6 months. T2- T4 mass causing paraparesis Restrictions Weight Bearing Restrictions: No     Mobility  Bed Mobility Overal bed mobility: Needs Assistance Bed Mobility: Rolling Rolling: +2 for physical assistance         General bed mobility comments: difficulty moving legs to sit or bring back in bed to lie down.  Minimal AROM noted to RLE, and no AROM to LLE.    Transfers                        Ambulation/Gait                   Stairs             Wheelchair Mobility    Modified Rankin (Stroke Patients Only)       Balance                                            Cognition Arousal/Alertness: Awake/alert Behavior During Therapy: WFL for tasks assessed/performed Overall Cognitive Status: Impaired/Different from baseline Area of Impairment: Problem solving, Awareness                     Memory: Decreased short-term memory Following Commands: Follows one step commands inconsistently   Awareness: Emergent Problem Solving: Slow processing, Requires verbal cues General Comments: stated it is July 2023, oriented to self and to location, correctly stated current president        Exercises  General Exercises - Lower Extremity Heel Slides: PROM, Both, 5 reps, Supine Hip Flexion/Marching: PROM, Both, 5 reps, Supine Other Exercises Other Exercises: Bridges, PROM, 5 sets    General Comments        Pertinent Vitals/Pain Pain Assessment Pain Score: 0-No pain    Home Living                          Prior Function            PT Goals (current goals can now be found in the care plan section) Acute Rehab PT Goals Patient Stated Goal: to get stronger PT Goal Formulation: With patient/family Time  For Goal Achievement: 09/14/22 Potential to Achieve Goals: Fair Progress towards PT goals: Progressing toward goals    Frequency    Min 1X/week      PT Plan Current plan remains appropriate    Co-evaluation              AM-PAC PT "6 Clicks" Mobility   Outcome Measure  Help needed turning from your back to your side while in a flat bed without using bedrails?: A Lot Help needed moving from lying on your back to sitting on the side of a flat bed without using bedrails?: Total Help needed moving to and from a bed to a chair (including a wheelchair)?: Total Help needed standing up from a chair using your arms (e.g., wheelchair or bedside chair)?: Total Help needed to walk in hospital room?: Total Help needed climbing 3-5 steps with a railing? : Total 6 Click Score: 7    End of Session   Activity Tolerance: Patient tolerated treatment well Patient left: in bed;with bed alarm set;with call bell/phone within reach Nurse Communication: Mobility status PT Visit Diagnosis: Difficulty in walking, not elsewhere classified (R26.2);Pain;Other abnormalities of gait and mobility (R26.89);History of falling (Z91.81)     Time: 8657-8469 PT Time Calculation (min) (ACUTE ONLY): 29 min  Charges:  $Therapeutic Exercise: 8-22 mins $Therapeutic Activity: 8-22 mins                     Sharlene Motts, [place]

## 2022-09-04 NOTE — Care Management Important Message (Signed)
Important Message  Patient Details IM Letter given. Name: LOVELEE GNIADEK MRN: [id number] Date of Birth: 06-27-1945   Medicare Important Message Given:  Yes     Caren Macadam 09/04/2022, 1:46 PM

## 2022-09-05 ENCOUNTER — Ambulatory Visit
Admit: 2022-09-05 | Discharge: 2022-09-05 | Disposition: A | Discharge status: Home / Self Care | Payer: Medicare HMO | Attending: Radiation Oncology | Admitting: Radiation Oncology

## 2022-09-05 ENCOUNTER — Other Ambulatory Visit (HOSPITAL_COMMUNITY): Payer: Self-pay

## 2022-09-05 ENCOUNTER — Other Ambulatory Visit: Payer: Self-pay

## 2022-09-05 DIAGNOSIS — E871 Hypo-osmolality and hyponatremia: Secondary | ICD-10-CM | POA: Diagnosis not present

## 2022-09-05 DIAGNOSIS — Z515 Encounter for palliative care: Secondary | ICD-10-CM | POA: Diagnosis not present

## 2022-09-05 DIAGNOSIS — G893 Neoplasm related pain (acute) (chronic): Secondary | ICD-10-CM | POA: Diagnosis not present

## 2022-09-05 DIAGNOSIS — R531 Weakness: Secondary | ICD-10-CM | POA: Diagnosis not present

## 2022-09-05 LAB — RAD ONC ARIA SESSION SUMMARY
Course Elapsed Days: 4
Plan Fractions Treated to Date: 2
Plan Prescribed Dose Per Fraction: 3 Gy
Plan Total Fractions Prescribed: 9
Plan Total Prescribed Dose: 27 Gy
Reference Point Dosage Given to Date: 6 Gy
Reference Point Session Dosage Given: 3 Gy
Session Number: 3

## 2022-09-05 LAB — BASIC METABOLIC PANEL
Anion gap: 4 — ABNORMAL LOW (ref 5–15)
BUN: 26 mg/dL — ABNORMAL HIGH (ref 8–23)
CO2: 28 mmol/L (ref 22–32)
Calcium: 7.9 mg/dL — ABNORMAL LOW (ref 8.9–10.3)
Chloride: 93 mmol/L — ABNORMAL LOW (ref 98–111)
Creatinine, Ser: 0.67 mg/dL (ref 0.44–1.00)
GFR, Estimated: >60 mL/min (ref >60)
Glucose, Bld: 127 mg/dL — ABNORMAL HIGH (ref 70–99)
Potassium: 4.7 mmol/L (ref 3.5–5.1)
Sodium: 125 mmol/L — ABNORMAL LOW (ref 135–145)

## 2022-09-05 LAB — GLUCOSE, CAPILLARY
Glucose-Capillary: 111 mg/dL — ABNORMAL HIGH (ref 70–99)
Glucose-Capillary: 123 mg/dL — ABNORMAL HIGH (ref 70–99)
Glucose-Capillary: 149 mg/dL — ABNORMAL HIGH (ref 70–99)
Glucose-Capillary: 174 mg/dL — ABNORMAL HIGH (ref 70–99)

## 2022-09-05 NOTE — Progress Notes (Signed)
OT Cancellation Note  Patient Details Name: MARGRETT KALB MRN: [id number] DOB: 10/31/1945   Cancelled Treatment:    Reason Eval/Treat Not Completed: Patient at procedure or test/ unavailable Patient is currently sleeping with radiation scheduled for around 9 am. OT to continue to follow and check back as schedule will allow Rosalio Loud, MS Acute Rehabilitation Department Office# [phone]  09/05/2022, 8:47 AM

## 2022-09-05 NOTE — Progress Notes (Signed)
PROGRESS NOTE    Kristi Jennings  MVH:[id number] DOB: [birth year] DOA: 08/30/2022 PCP: [name], NP   Brief Narrative: Kristi Jennings is a 77 y.o. 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 Patient presented secondary to progressive weakness and fatigue with associated sodium of 123, presumed secondary to her oral chemotherapy and dehydration. Patient started on IV fluids and CT imaging concerning for new metastasis. MRI thoracic and lumbar spine with multiple areas of pathologic fractures in addition to spinal stenosis and soft tissue mass involving the left paravertebral soft tissues at T2-T4 and causing probable cord compression resulting in paraplegia. Emergent radiation therapy started. MRI brain identified multiple punctate infarcts; neurology consulted. Patient started emergent XRT on 4/12.   Assessment and Plan:  Symptomatic hyponatremia Presumed secondary to hyponatremia with some improvement after IV fluids. Patient also with metastatic cancer. SIADH would be a possible concern. TSH normal. Urine sodium somewhat high for dehydration; urine osmolality not obtained. Sodium improved and has stalled/stable with sodium tablets and fluid restriction -Sodium tablets -Fluid restriction of 1200 mL  Bilateral lower extremity weakness Likely related to thoracic mass involving the spinal canal at T2-T4 with resultant severe spinal stenosis but unclear evidence of cord compression. History of severe cervical cord compression in 2022. CT head with concern for possible brain metastasis, which is ruled out on non-contrast MRI. MRI thoracic/lumbar spine with multiple pathologic compression fractures and severe spinal stenosis noted at L4-5. Weakness has now progressed to flaccid  paralysis. Neurosurgery consulted but recommend no surgical management secondary to poor patient candidacy. Radiation oncology consulted and performed emergent radiation therapy on 4/12. Decadron started. PT/OT recommending SNF. -Radiation oncology recommendations: XRT (4/12>>  Chest pain Likely related to known metastatic bony lesions. Involvement of the manubrium.  -Analgesics PRN  Chronic pulmonary embolism -Continue Eliquis  Acute stroke Multiple punctate infarcts noted on MRI. Patient is on Eliquis for pulmonary embolism. No evidence of hemorrhage. LDL of 80. Hemoglobin A1C of 5.2%. Transthoracic Echocardiogram significant for no intracardiac source of embolism or interatrial shunt.  PT/OT recommendations for SNF. -Continue Eliquis  Metastatic non-small cell lung cancer Bone metastasis Right adrenal metastasis Patient follows with Dr. [name]. Recently on oral chemotherapy which was held secondary to hyponatremia and dehydration. Patient seen by her primary oncologist.  Multiple vertebral compression fractures Noted on prior imaging. Likely pathologic fractures from metastasis.  Ascending aortic aneurysm Noted. Blood pressure mostly normotensive.  Prediabetes Noted. Most recent hemoglobin A1C is 5.2%, however.  Primary hypertension -Continue home atenolol  Polymyalgia rheumatica -Continue home gabapentin  Cancer related pain Chronic pain -Continue home MS Contin and dilaudid -Miralax and Senokot-S  Underweight Severe malnutrition Secondary to chronic illness. Estimated body mass index is 17.22 kg/m as calculated from the following:   Height as of this encounter:  (1.676 m).   Weight as of this encounter: 48.4 kg.  Pressure injury Mid coccyx. Present on admission.   DVT prophylaxis: Eliquis Code Status:   Code Status: Full Code Family Communication: None at bedside. Disposition Plan: Discharge to SNF pending continued radiation oncology  recommendations   Consultants:  Neurosurgery Neurology Medical oncology Radiation oncology  Procedures:  4/13: Transthoracic Echocardiogram  Antimicrobials: None    Subjective: No issues noted overnight. Patient underwent XRT this morning.  Objective: BP (!) 167/77 (BP Location: Right Arm)   Pulse Marland Kitchen)  52   Temp (!) 97.5 F (36.4 C) (Oral)   Resp 19   Ht 5\' 6"  (1.676 m)   Wt 48.4 kg   SpO2 97%   BMI 17.22 kg/m   Examination:  General exam: Appears calm and comfortable Respiratory system: Respiratory effort normal. Central nervous system: Alert and oriented.   Data Reviewed: I have personally reviewed following labs and imaging studies  CBC Lab Results  Component Value Date   WBC 13.3 (H) 09/04/2022   RBC 3.51 (L) 09/04/2022   HGB 10.3 (L) 09/04/2022   HCT 31.0 (L) 09/04/2022   MCV 88.3 09/04/2022   MCH 29.3 09/04/2022   PLT 198 09/04/2022   MCHC 33.2 09/04/2022   RDW 12.9 09/04/2022   LYMPHSABS 1.1 08/30/2022   MONOABS 1.4 (H) 08/30/2022   EOSABS 0.1 08/30/2022   BASOSABS 0.0 08/30/2022     Last metabolic panel Lab Results  Component Value Date   NA 125 (L) 09/05/2022   K 4.7 09/05/2022   CL 93 (L) 09/05/2022   CO2 28 09/05/2022   BUN 26 (H) 09/05/2022   CREATININE 0.67 09/05/2022   GLUCOSE 127 (H) 09/05/2022   GFRNONAA >60 09/05/2022   CALCIUM 7.9 (L) 09/05/2022   PROT 6.4 (L) 08/30/2022   ALBUMIN 3.3 (L) 08/30/2022   BILITOT 0.7 08/30/2022   ALKPHOS 56 08/30/2022   AST 31 08/30/2022   ALT 16 08/30/2022   ANIONGAP 4 (L) 09/05/2022    GFR: Estimated Creatinine Clearance: 46.4 mL/min (by C-G formula based on SCr of 0.67 mg/dL).  No results found for this or any previous visit (from the past 240 hour(s)).    Radiology Studies: No results found.    LOS: 6 days    Jacquelin Hawking, MD [facility] 09/05/2022, 1:22 PM   If 7PM-7AM, please contact night-coverage [email or web address]

## 2022-09-05 NOTE — Progress Notes (Signed)
Chaplain worked to engage in initial visit but Kristi Jennings was not in her room. Chaplain will follow-up later today.     09/05/22 0900  Spiritual Encounters  Type of Visit Initial  Care provided to: Pt not available

## 2022-09-05 NOTE — Consult Note (Signed)
Palliative Care Consult Note                                  Date: 09/05/2022   Patient Name: Kristi Jennings  DOB: [birth year]  MRN: [id number]  Age / Sex: 77 y.o., female  PCP: [name], NP Referring Physician: Narda Bonds, MD  Reason for Consultation: {Reason for Consult:23484}  HPI/Patient Profile: Palliative Care consult requested for goals of care discussion in this 77 y.o. female  with past medical history of *** admitted on 08/30/2022 with ***.   Past Medical History:  Diagnosis Date   Borderline diabetes mellitus    Bronchogenic cancer of right lung    Dyslipidemia    History of radiation therapy    right chest 12/21/2020-12/24/2020  Dr Antony Blackbird   History of radiation therapy    right lung 06/07/2021-06/17/2021  Dr Antony Blackbird   Hypertension    Polymyalgia rheumatica      Subjective:   This NP Royal Hawthorn reviewed medical records, received report from team, assessed the patient and then met at the patient's bedside with *** to discuss diagnosis, prognosis, GOC, EOL wishes disposition and options.   Concept of Palliative Care was introduced as specialized medical care for people and their families living with serious illness.  It focuses on providing relief from the symptoms and stress of a serious illness.  The goal is to improve quality of life for both the patient and the family. Values and goals of care important to patient and family were attempted to be elicited.  Created space and opportunity for patient and family to explore state of health prior to admission, thoughts, and feelings.   We discussed Her current illness and what it means in the larger context of Her on-going co-morbidities. Natural disease trajectory and expectations were discussed.  **verbalized understanding of current illness and co-morbidities.   I discussed the importance of continued conversation with family and their medical providers  regarding overall plan of care and treatment options, ensuring decisions are within the context of the patients values and GOCs.  Questions and concerns were addressed.  Hard Choices booklet left for review. The family was encouraged to call with questions or concerns.  PMT will continue to support holistically as needed.  Life Review: ***   Objective:   Primary Diagnoses: Present on Admission:  Hyponatremia   Scheduled Meds:  apixaban  2.5 mg Oral BID   atenolol  25 mg Oral Q M,W,F   Chlorhexidine Gluconate Cloth  6 each Topical Daily   dexamethasone  6 mg Oral Q8H   escitalopram  20 mg Oral QHS   gabapentin  300 mg Oral QHS   melatonin  10 mg Oral QHS   morphine  30 mg Oral Q12H   pantoprazole  40 mg Oral Daily   polyethylene glycol  17 g Oral BID   senna-docusate  1 tablet Oral BID   sodium chloride  1 g Oral TID WC    Continuous Infusions:   PRN Meds: acetaminophen **OR** acetaminophen, albuterol, HYDROmorphone, LORazepam, ondansetron **OR** ondansetron (ZOFRAN) IV, mouth rinse, polyvinyl alcohol  Allergies  Allergen Reactions   Shellfish-Derived Products Hives, Itching and Rash   Augmentin [Amoxicillin-Pot Clavulanate] Nausea And Vomiting    Review of Systems Unless otherwise noted, a complete review of systems is negative.  Physical Exam General: NAD, frail chronically-ill appearing Cardiovascular: regular rate and rhythm Pulmonary:  clear ant fields, diminished bilaterally  Abdomen: soft, nontender, + bowel sounds Extremities: no edema, no joint deformities Skin: no rashes, warm and dry Neurological:   Vital Signs:  BP (!) 145/80 (BP Location: Left Arm)   Pulse (!) 49   Temp (!) 97.4 F (36.3 C) (Oral)   Resp 17   Ht  (1.676 m)   Wt 48.4 kg   SpO2 98%   BMI 17.22 kg/m  Pain Scale: 0-10   Pain Score: Asleep  SpO2: SpO2: 98 % O2 Device:SpO2: 98 % O2 Flow Rate: .O2 Flow Rate (L/min): 2 L/min  IO: Intake/output summary:  Intake/Output  Summary (Last 24 hours) at 09/05/2022 1637 Last data filed at 09/05/2022 7829 Gross per 24 hour  Intake 252 ml  Output 1475 ml  Net -1223 ml    LBM: Last BM Date : 09/05/22 Baseline Weight: Weight: 42.6 kg Most recent weight: Weight: 48.4 kg      Palliative Assessment/Data: ***   Advanced Care Planning:   Primary Decision Maker: {Primary Decision FAOZH:08657}  Code Status/Advance Care Planning: {Palliative Code status:23503}  A discussion was had today regarding advanced directives. Concepts specific to code status, artifical feeding and hydration, continued IV antibiotics and rehospitalization was had.  The difference between a aggressive medical intervention path and a palliative comfort care path was discussed. ***The MOST form was introduced and discussed.***  Hospice and Palliative Care services outpatient were explained and offered. Patient and family verbalized their understanding and awareness of both palliative and hospice's goals and philosophy of care.   Assessment & Plan:   SUMMARY OF RECOMMENDATIONS   DNR/DNI/Full Code-as confirmed by  Continue with current plan of care  PMT will continue to support and follow as needed. Please call team line with urgent needs.  Symptom Management:  Per Attending/See below  Palliative Prophylaxis:  {Palliative Prophylaxis:21015}  Additional Recommendations (Limitations, Scope, Preferences): {Recommended Scope and Preferences:21019}  Psycho-social/Spiritual:  Desire for further Chaplaincy support: {YES NO:22349} Additional Recommendations: {PAL SOCIAL:21064}  Prognosis:  {Palliative Care Prognosis:23504}  Discharge Planning:  {Palliative dispostion:23505}   Discussed with:     Patient*** expressed understanding and was in agreement with this plan.   Time In: *** Time Out: *** Time Total: ***  Visit consisted of counseling and education dealing with the complex and emotionally intense issues of symptom  management and palliative care in the setting of serious and potentially life-threatening illness.Greater than 50%  of this time was spent counseling and coordinating care related to the above assessment and plan.  Signed by:  Willette Alma, AGPCNP-BC [facility]   Phone: [phone] Pager: [phone] Amion: Thea Alken   Thank you for allowing the Palliative Medicine Team to assist in the care of this patient. Please utilize secure chat with additional questions, if there is no response within 30 minutes please call the above phone number. Palliative Medicine Team providers are available by phone from 7am to 5pm daily and can be reached through the team cell phone.  Should this patient require assistance outside of these hours, please call the patient's attending physician.  *Please note that this is a verbal dictation therefore any spelling or grammatical errors are due to the "[employer] One" system interpretation.

## 2022-09-05 NOTE — Progress Notes (Signed)
Occupational Therapy Treatment Patient Details Name: Kristi Jennings MRN: [id number] DOB: [birth year] Today's Date: 09/05/2022   History of present illness 77 y.o. female with medical history significant of  pre diabetes, HLD, Hypertension, Polymyalgia rheumatica , Pulmonary emboli on OAC,  Ascending aortic aneurysm,DJD of the spine, Stage IV (T3, N2, M1 C) non-small cell lung cancer with bone and right adrenal metastasis followed by Dr. [name] s/p radiation , palliative chemo s/p 18 cycles . Of note patient last chemo cycle was 12/2020, treatment stopped due to disease progression  Patient has had increasing weakness  and fatigue at home x 1 week with associated muscle twitching. Dx of hyponatremia, imaging shows likely brain mets, LLE paresis, RLE weakness.   OT comments  Patient with limited progress today due to immediate hygiene needs from large liquid bowel movement of which pt was totally unaware.  Pt had originally agreed to try EOB for lunch with assist from OT, but upon rolling to mobilize to EOB, noted pt required hygiene and therefore changed plan to bed level care.  Pt able to roll with Mod-Max As of 2 people ad use Ues on bed rail to stay side-lying and allow OT and RN to perform total care for peri cleansing.  Pt then agreeable to move bed to chair position and pt was assisted with hand hygiene and full setup of lunch. Pt then demonstrated ability to feed self.   Patient remains limited by paraplegia and numbness below waist, decreased mood from deficits, generalized weakness and decreased activity tolerance along with deficits noted below. Pt continues to demonstrate good rehab potential and would benefit from continued skilled OT to increase safety and independence with ADLs and functional transfers to allow pt to return home safely and reduce caregiver burden and fall risk.    Recommendations for follow up therapy are one component of a multi-disciplinary discharge planning process, led by  the attending physician.  Recommendations may be updated based on patient status, additional functional criteria and insurance authorization.    Assistance Recommended at Discharge Frequent or constant Supervision/Assistance  Patient can return home with the following  Help with stairs or ramp for entrance;Assistance with feeding;Two people to help with walking and/or transfers;A lot of help with bathing/dressing/bathroom;Direct supervision/assist for financial management;Assist for transportation;Assistance with cooking/housework   Equipment Recommendations  BSC/3in1;Wheelchair (measurements OT);Wheelchair cushion (measurements OT)    Recommendations for Other Services      Precautions / Restrictions Precautions Precautions: Fall Restrictions Weight Bearing Restrictions: No       Mobility Bed Mobility   Bed Mobility: Rolling Rolling: +2 for physical assistance, Max assist         General bed mobility comments: Crossed pt's ankles for each roll to RT and LT x 2 each. Pt able to use UEs on bed rail to remain side lying well.    Transfers                         Balance                                           ADL either performed or assessed with clinical judgement   ADL Overall ADL's : Needs assistance/impaired Eating/Feeding: Set up;Cueing for sequencing;Bed level Eating/Feeding Details (indicate cue type and reason): Bed placed in chair position. Pt required multiple pillows to bolster and support midline  sitting. Once fully setup, pt able to manipulaet utensils nd feed self. Grooming: Bed level Grooming Details (indicate cue type and reason): Chair position     Lower Body Bathing: Bed level;Total assistance;+2 for physical assistance Lower Body Bathing Details (indicate cue type and reason): Please see toileting below. Upper Body Dressing : Minimal assistance;Bed level Upper Body Dressing Details (indicate cue type and reason): To  change fresh anterior gown. Lower Body Dressing: Total assistance;Bed level       Toileting- Clothing Manipulation and Hygiene: Total assistance;+2 for physical assistance;Bed level Toileting - Clothing Manipulation Details (indicate cue type and reason): Begane rolling pt to mobilize to EOB, however pt found to be very soiled.  Pt required total Assist of 2 people to all hygiene. Pt reports total unawareness of bowel movement and having no feeling at all below waist.     Functional mobility during ADLs: Maximal assistance;Moderate assistance;+2 for physical assistance (bed level)      Extremity/Trunk Assessment Upper Extremity Assessment RUE Deficits / Details: able to move against gravity, grossly 3+/5 RUE Sensation: WNL RUE Coordination: WNL LUE Deficits / Details: able to move against gravity, grossly 3+/5 LUE Sensation: WNL LUE Coordination: WNL   Lower Extremity Assessment Lower Extremity Assessment: RLE deficits/detail;LLE deficits/detail RLE Sensation: decreased light touch;decreased proprioception LLE Sensation: decreased light touch;decreased proprioception   Cervical / Trunk Assessment Cervical / Trunk Assessment: Other exceptions;Kyphotic Cervical / Trunk Exceptions: Noted heel cord tightness    Vision   Vision Assessment?: No apparent visual deficits   Perception     Praxis      Cognition Arousal/Alertness: Awake/alert Behavior During Therapy: WFL for tasks assessed/performed Overall Cognitive Status: Impaired/Different from baseline Area of Impairment: Problem solving, Awareness, Memory, Following commands, Orientation                 Orientation Level: Time   Memory: Decreased short-term memory (Asking repeatedly if she went to radiation this morning. Reports no memory of it.) Following Commands: Follows one step commands with increased time   Awareness: Emergent Problem Solving: Requires verbal cues, Decreased initiation, Slow processing           Exercises Other Exercises Other Exercises: Slow, sustained heel cord sretches    Shoulder Instructions       General Comments      Pertinent Vitals/ Pain       Pain Assessment Pain Assessment: No/denies pain  Home Living                                          Prior Functioning/Environment              Frequency  Min 2X/week        Progress Toward Goals  OT Goals(current goals can now be found in the care plan section)  Progress towards OT goals: Progressing toward goals  Acute Rehab OT Goals Patient Stated Goal: Be "Kristi Jennings" again OT Goal Formulation: With patient Time For Goal Achievement: 09/14/22 Potential to Achieve Goals: Good  Plan Discharge plan remains appropriate    Co-evaluation                 AM-PAC OT "6 Clicks" Daily Activity     Outcome Measure   Help from another person eating meals?: A Little Help from another person taking care of personal grooming?: A Little Help from another person toileting, which includes using  toliet, bedpan, or urinal?: Total Help from another person bathing (including washing, rinsing, drying)?: A Lot Help from another person to put on and taking off regular upper body clothing?: A Little Help from another person to put on and taking off regular lower body clothing?: Total 6 Click Score: 13    End of Session    OT Visit Diagnosis: Unsteadiness on feet (R26.81);Muscle weakness (generalized) (M62.81);History of falling (Z91.81);Other symptoms and signs involving cognitive function   Activity Tolerance Patient tolerated treatment well   Patient Left in bed;with call bell/phone within reach   Nurse Communication Other (comment) (Rn into assist with bed mobility and hygiene)        Time: 1345-1415 OT Time Calculation (min): 30 min  Charges: OT General Charges $OT Visit: 1 Visit OT Treatments $Self Care/Home Management : 8-22 mins $Therapeutic Activity: 8-22  mins  Victorino Dike, OT Acute Rehab Services Office: [phone] 09/05/2022   Theodoro Clock 09/05/2022, 2:26 PM

## 2022-09-06 ENCOUNTER — Ambulatory Visit
Admit: 2022-09-06 | Discharge: 2022-09-06 | Disposition: A | Discharge status: Home / Self Care | Payer: Medicare HMO | Attending: Radiation Oncology | Admitting: Radiation Oncology

## 2022-09-06 ENCOUNTER — Other Ambulatory Visit: Payer: Self-pay

## 2022-09-06 DIAGNOSIS — E871 Hypo-osmolality and hyponatremia: Secondary | ICD-10-CM | POA: Diagnosis not present

## 2022-09-06 DIAGNOSIS — R531 Weakness: Secondary | ICD-10-CM | POA: Diagnosis not present

## 2022-09-06 DIAGNOSIS — G893 Neoplasm related pain (acute) (chronic): Secondary | ICD-10-CM

## 2022-09-06 DIAGNOSIS — Z7189 Other specified counseling: Secondary | ICD-10-CM

## 2022-09-06 DIAGNOSIS — Z515 Encounter for palliative care: Secondary | ICD-10-CM

## 2022-09-06 DIAGNOSIS — F419 Anxiety disorder, unspecified: Secondary | ICD-10-CM

## 2022-09-06 LAB — BASIC METABOLIC PANEL
Anion gap: 7 (ref 5–15)
BUN: 27 mg/dL — ABNORMAL HIGH (ref 8–23)
CO2: 28 mmol/L (ref 22–32)
Calcium: 8 mg/dL — ABNORMAL LOW (ref 8.9–10.3)
Chloride: 92 mmol/L — ABNORMAL LOW (ref 98–111)
Creatinine, Ser: 0.69 mg/dL (ref 0.44–1.00)
GFR, Estimated: >60 mL/min (ref >60)
Glucose, Bld: 127 mg/dL — ABNORMAL HIGH (ref 70–99)
Potassium: 4.7 mmol/L (ref 3.5–5.1)
Sodium: 127 mmol/L — ABNORMAL LOW (ref 135–145)

## 2022-09-06 LAB — RAD ONC ARIA SESSION SUMMARY
Course Elapsed Days: 5
Plan Fractions Treated to Date: 3
Plan Prescribed Dose Per Fraction: 3 Gy
Plan Total Fractions Prescribed: 9
Plan Total Prescribed Dose: 27 Gy
Reference Point Dosage Given to Date: 9 Gy
Reference Point Session Dosage Given: 3 Gy
Session Number: 4

## 2022-09-06 LAB — CBC
HCT: 30.8 % — ABNORMAL LOW (ref 36.0–46.0)
Hemoglobin: 10.3 g/dL — ABNORMAL LOW (ref 12.0–15.0)
MCH: 29.4 pg (ref 26.0–34.0)
MCHC: 33.4 g/dL (ref 30.0–36.0)
MCV: 88 fL (ref 80.0–100.0)
Platelets: 184 10*3/uL (ref 150–400)
RBC: 3.5 MIL/uL — ABNORMAL LOW (ref 3.87–5.11)
RDW: 12.8 % (ref 11.5–15.5)
WBC: 13.9 10*3/uL — ABNORMAL HIGH (ref 4.0–10.5)
nRBC: 0 % (ref 0.0–0.2)

## 2022-09-06 LAB — GLUCOSE, CAPILLARY
Glucose-Capillary: 118 mg/dL — ABNORMAL HIGH (ref 70–99)
Glucose-Capillary: 133 mg/dL — ABNORMAL HIGH (ref 70–99)
Glucose-Capillary: 184 mg/dL — ABNORMAL HIGH (ref 70–99)
Glucose-Capillary: 201 mg/dL — ABNORMAL HIGH (ref 70–99)

## 2022-09-06 NOTE — Progress Notes (Signed)
PROGRESS NOTE    Kristi Jennings  ZOX:[id number] DOB: 10-01-1945 DOA: 08/30/2022 PCP: [name], NP   Brief Narrative: Kristi Jennings is a 77 y.o. 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 Patient presented secondary to progressive weakness and fatigue with associated sodium of 123, presumed secondary to her oral chemotherapy and dehydration. Patient started on IV fluids and CT imaging concerning for new metastasis. MRI thoracic and lumbar spine with multiple areas of pathologic fractures in addition to spinal stenosis and soft tissue mass involving the left paravertebral soft tissues at T2-T4 and causing probable cord compression resulting in paraplegia. Emergent radiation therapy started. MRI brain identified multiple punctate infarcts; neurology consulted. Patient started emergent XRT on 4/12.  Assessment and Plan:  Severe spinal stenosis secondary to metastatic/pathologic fractures(POA, multiple) Bilateral lower extremity weakness/paralysis - Likely related to thoracic mass involving the spinal canal at T2-T4 with resultant severe spinal stenosis but unclear evidence of cord compression. History of severe cervical cord compression in 2022. CT head with concern for possible brain metastasis, which is ruled out on non-contrast MRI. MRI thoracic/lumbar spine with multiple pathologic compression fractures and severe spinal stenosis noted at L4-5. Weakness has now progressed to flaccid paralysis. Neurosurgery consulted but recommend no surgical management secondary to poor patient candidacy.  -Radiation oncology recommendations: XRT and high dose steroids (per 4/12 discussion). -PT/OT recommending SNF.  Chest pain, chronic Likely related to known metastatic bony lesions.  Involvement of the manubrium.  -Analgesics PRN  Chronic pulmonary embolism -Continue Eliquis  Acute punctate CVA, multiple Multiple punctate infarcts noted on MRI. Patient is on Eliquis for pulmonary embolism. No evidence of hemorrhage. LDL of 80. Hemoglobin A1C of 5.2%. Transthoracic Echocardiogram significant for no intracardiac source of embolism or interatrial shunt.  PT/OT recommendations for SNF. -Continue Eliquis  Metastatic non-small cell lung cancer Bone metastasis Right adrenal metastasis Patient follows with Dr. [name]. Recently on oral chemotherapy which was held secondary to hyponatremia and dehydration. Patient seen by her primary oncologist.  Multiple vertebral compression fractures Noted on prior imaging. Likely pathologic fractures from metastasis.  Symptomatic hyponatremia, likely multifactorial Acute on chronic, improving with fluids patient also with metastatic cancer.  TSH normal.  Urine sodium inconsistent with dehydration; continue salt tabs, fluid restriction  Ascending aortic aneurysm Noted. Blood pressure mostly normotensive.  Repeat routine imaging 3 months per guidelines  Prediabetes Hemoglobin A1C is 5.2%  Primary hypertension -Continue home atenolol  Polymyalgia rheumatica -Continue home gabapentin  Cancer related pain Chronic pain -Continue home MS Contin and dilaudid -Miralax and Senokot-S  Underweight Severe malnutrition Secondary to chronic illness. Estimated body mass index is 17.22 kg/m as calculated from the following:   Height as of this encounter:  (1.676 m).   Weight as of this encounter: 48.4 kg.  Pressure injury Mid coccyx. Present on admission.   DVT prophylaxis: Eliquis Code Status:   Code Status: Full Code Family Communication: None at bedside. Disposition Plan: Discharge to SNF pending continued radiation oncology recommendations   Consultants:  Neurosurgery Neurology Medical oncology Radiation  oncology  Procedures:  4/13: Transthoracic Echocardiogram  Antimicrobials: None    Subjective: No issues noted overnight. Patient underwent XRT this morning.  Objective: BP (!) 156/73 (BP Location: Kristi Jennings   Pulse (!) 53   Temp 98 F (36.7 C) (Oral)   Resp 16  Ht  (1.676 m)   Wt 48.4 kg   SpO2 96%   BMI 17.22 kg/m   Examination:  General exam: Appears calm and comfortable Respiratory system: Respiratory effort normal. Central nervous system: Alert and oriented.   Data Reviewed: I have personally reviewed following labs and imaging studies  CBC Lab Results  Component Value Date   WBC 13.9 (H) 09/06/2022   RBC 3.50 (L) 09/06/2022   HGB 10.3 (L) 09/06/2022   HCT 30.8 (L) 09/06/2022   MCV 88.0 09/06/2022   MCH 29.4 09/06/2022   PLT 184 09/06/2022   MCHC 33.4 09/06/2022   RDW 12.8 09/06/2022   LYMPHSABS 1.1 08/30/2022   MONOABS 1.4 (H) 08/30/2022   EOSABS 0.1 08/30/2022   BASOSABS 0.0 08/30/2022     Last metabolic panel Lab Results  Component Value Date   NA 127 (L) 09/06/2022   K 4.7 09/06/2022   CL 92 (L) 09/06/2022   CO2 28 09/06/2022   BUN 27 (H) 09/06/2022   CREATININE 0.69 09/06/2022   GLUCOSE 127 (H) 09/06/2022   GFRNONAA >60 09/06/2022   CALCIUM 8.0 (L) 09/06/2022   PROT 6.4 (L) 08/30/2022   ALBUMIN 3.3 (L) 08/30/2022   BILITOT 0.7 08/30/2022   ALKPHOS 56 08/30/2022   AST 31 08/30/2022   ALT 16 08/30/2022   ANIONGAP 7 09/06/2022    GFR: Estimated Creatinine Clearance: 46.4 mL/min (by C-G formula based on SCr of 0.69 mg/dL).  No results found for this or any previous visit (from the past 240 hour(s)).    Radiology Studies: No results found.    LOS: 7 days    Carma Leaven DO [facility] 09/06/2022, 8:12 AM   If 7PM-7AM, please contact night-coverage [email or web address]

## 2022-09-06 NOTE — TOC Progression Note (Signed)
Transition of Care [facility]) - Progression Note    Patient Details  Name: Kristi Jennings MRN: [id number] Date of Birth: [birth year]  Transition of Care [facility]) CM/SW Contact  Geni Bers, RN Phone Number: 09/06/2022, 8:44 AM  Clinical Narrative:     Pt's husband was called. Explained bed offers. Mr. Lince, asked for CSW to come see him and pt. Mr. Wishon have an appointment at 9 am. This CM asked that he calls when he is in pt's room. So that someone will come talk to him and his wife.    Expected Discharge Plan: Skilled Nursing Facility Barriers to Discharge: Continued Medical Work up  Expected Discharge Plan and Services   Discharge Planning Services: CM Consult   Living arrangements for the past 2 months: Single Family Home                                       Social Determinants of Health (SDOH) Interventions SDOH Screenings   Food Insecurity: No Food Insecurity (08/31/2022)  Housing: Low Risk  (08/31/2022)  Transportation Needs: No Transportation Needs (08/31/2022)  Utilities: Not At Risk (08/31/2022)  Depression (PHQ2-9): Low Risk  (10/11/2021)  Tobacco Use: Medium Risk (09/01/2022)    Readmission Risk Interventions    01/06/2021    1:42 PM  Readmission Risk Prevention Plan  Transportation Screening Complete  PCP or Specialist Appt within 3-5 Days Complete  HRI or Home Care Consult Complete  Social Work Consult for Recovery Care Planning/Counseling Complete  Palliative Care Screening Complete  Medication Review Oceanographer) Complete

## 2022-09-06 NOTE — Progress Notes (Signed)
Physical Therapy Treatment Patient Details Name: Kristi Jennings MRN: [id number] DOB: 11-15-1945 Today's Date: 09/06/2022   History of Present Illness 77 y.o. female with medical history significant of  pre diabetes, HLD, Hypertension, Polymyalgia rheumatica , Pulmonary emboli on OAC,  Ascending aortic aneurysm,DJD of the spine, Stage IV (T3, N2, M1 C) non-small cell lung cancer with bone and right adrenal metastasis followed by Dr. [name] s/p radiation , palliative chemo s/p 18 cycles . Of note patient last chemo cycle was 12/2020, treatment stopped due to disease progression  Patient has had increasing weakness  and fatigue at home x 1 week with associated muscle twitching. Dx of hyponatremia, imaging shows likely brain mets, LLE paresis, RLE weakness.    PT Comments    Today's PT session focused on bed mobility, activity tolerance, and static/dynamic seated balance interventions. Pt required MOD A for supine to sit with HOB elevated and use of bed rails. Fluctuating between MOD-MAX A for seated balance. Pt very eager to participate in therapy session and expresses desire for wanting to continue to work hard. Pt will benefit from continued skilled PT to increase their independence and maximize safety with mobility.      Recommendations for follow up therapy are one component of a multi-disciplinary discharge planning process, led by the attending physician.  Recommendations may be updated based on patient status, additional functional criteria and insurance authorization.  Follow Up Recommendations  Can patient physically be transported by private vehicle: No    Assistance Recommended at Discharge Frequent or constant Supervision/Assistance  Patient can return home with the following Two people to help with walking and/or transfers;A lot of help with bathing/dressing/bathroom;Assistance with cooking/housework;Direct supervision/assist for medications management;Assist for transportation;Help with  stairs or ramp for entrance;Direct supervision/assist for financial management   Equipment Recommendations  Wheelchair (measurements PT);Wheelchair cushion (measurements PT)    Recommendations for Other Services       Precautions / Restrictions Precautions Precautions: Fall Precaution Comments: fell on day of admission, denies other falls in past 6 months. T2- T4 mass causing paraparesis Restrictions Weight Bearing Restrictions: No     Mobility  Bed Mobility Overal bed mobility: Needs Assistance Bed Mobility: Supine to Sit, Sit to Supine     Supine to sit: Mod assist, HOB elevated Sit to supine: Max assist, +2 for physical assistance, +2 for safety/equipment   General bed mobility comments: MOD A for supine to sit with use of bed rails to bring trunk to upright. MAX A to scoot to EOB using bed pad.    Transfers                        Ambulation/Gait                   Stairs             Wheelchair Mobility    Modified Rankin (Stroke Patients Only)       Balance Overall balance assessment: Needs assistance Sitting-balance support: Bilateral upper extremity supported, Feet supported Sitting balance-Leahy Scale: Poor Sitting balance - Comments: requires MOD-MAX external support to maintain upright/midline seated balance Postural control: Posterior lean (intermittent R lateral leaning)                                  Cognition Arousal/Alertness: Awake/alert Behavior During Therapy: WFL for tasks assessed/performed Overall Cognitive Status: Impaired/Different from baseline Area of Impairment:  Memory, Problem solving                     Memory: Decreased short-term memory       Problem Solving: Requires verbal cues, Decreased initiation, Slow processing          Exercises Other Exercises Other Exercises: Lateral elbow leans onto pillow x5 each side Other Exercises: modified sit ups while seated EOB- use of  PT hands to pull anteriorly and then emphasis on slow control posterior weight shift with tech beding to support. x15. Modified with elimination of HHA and emphasis on use of core/trunk for control. Other Exercises: static seated balance with B UE support on tray table with emphasis on forward gaze and shifting weight anteriorly onto UEs/table Other Exercises: multidirectional reaching with UE support on tray table- increased difficulty with supporting self on L UE compared to R.    General Comments        Pertinent Vitals/Pain Pain Assessment Pain Assessment: Faces Faces Pain Scale: Hurts little more Pain Location: chest soreness when lying flat - use of bone pillow for splinting Pain Descriptors / Indicators: Sore Pain Intervention(s): Limited activity within patient's tolerance, Monitored during session, Repositioned    Home Living                          Prior Function            PT Goals (current goals can now be found in the care plan section) Acute Rehab PT Goals PT Goal Formulation: With patient/family Time For Goal Achievement: 09/14/22 Potential to Achieve Goals: Fair Progress towards PT goals: Progressing toward goals    Frequency    Min 1X/week      PT Plan Current plan remains appropriate    Co-evaluation              AM-PAC PT "6 Clicks" Mobility   Outcome Measure  Help needed turning from your back to your side while in a flat bed without using bedrails?: A Lot Help needed moving from lying on your back to sitting on the side of a flat bed without using bedrails?: Total Help needed moving to and from a bed to a chair (including a wheelchair)?: Total Help needed standing up from a chair using your arms (e.g., wheelchair or bedside chair)?: Total Help needed to walk in hospital room?: Total Help needed climbing 3-5 steps with a railing? : Total 6 Click Score: 7    End of Session   Activity Tolerance: Patient tolerated treatment  well Patient left: in bed;with bed alarm set;with call bell/phone within reach Nurse Communication: Mobility status PT Visit Diagnosis: Difficulty in walking, not elsewhere classified (R26.2);Other abnormalities of gait and mobility (R26.89);History of falling (Z91.81)     Time: 1610-9604 PT Time Calculation (min) (ACUTE ONLY): 31 min  Charges:  $Therapeutic Activity: 8-22 mins $Neuromuscular Re-education: 8-22 mins                    Lyman Speller PT, DPT  Acute Rehabilitation Services  Office [phone]   09/06/2022, 12:22 PM

## 2022-09-06 NOTE — Progress Notes (Signed)
Daily Progress Note   Patient Name: Kristi Jennings       Date: 09/06/2022 DOB: 12-11-1945  Age: 77 y.o. MRN#: [id number] Attending Physician: Azucena Fallen, MD Primary Care Physician: [name], NP Admit Date: 08/30/2022  Reason for Consultation/Follow-up: Establishing goals of care, Non pain symptom management, and Pain control  Subjective: Chart Reviewed. Updates Received. Patient Assessed. Husband present at bedside. RN at bedside repositioning. Mrs. Merida denies any acute distress. Reports pain is controlled. Rating 1-2 out 10. Is appreciative of her increased appetite due to high dose steroids. Drinking a chocolate boost. Is complaining of dry mouth. Ice chips and biotene provided.   Length of Stay: 7 days  Vital Signs: BP 132/63 (BP Location: Right Arm)   Pulse (!) 51   Temp 97.8 F (36.6 C) (Oral)   Resp 18   Ht  (1.676 m)   Wt 48.4 kg   SpO2 98%   BMI 17.22 kg/m  SpO2: SpO2: 98 % O2 Device: O2 Device: Room Air O2 Flow Rate: O2 Flow Rate (L/min): 2 L/min Last Weight  Most recent update: 09/06/2022  6:21 AM    Weight  48.4 kg (106 lb 11.2 oz)             Intake/Output Summary (Last 24 hours) at 09/06/2022 1617 Last data filed at 09/06/2022 0947 Gross per 24 hour  Intake 120 ml  Output 520 ml  Net -400 ml    Physical Exam: Gen:  NAD, ill  CV: Regular rate and rhythm, no murmurs rubs or gallops PULM: clear to auscultation bilaterally. No wheezes/rales/rhonchi ABD: soft/nontender/nondistended/normal bowel sounds EXT: No edema, flaccid paralysis Neuro: Alert and oriented x3, some memory deficit     Palliative Care Assessment & Plan  HPI: Palliative Care consult requested for goals of care discussion in this 77 y.o. female  with past medical history of stage IV non-small cell lung cancer with bone and right adrenal metastasis s/p radiation, palliative chemo (discontinued due to progression), immunotherapy, currently on oral Krazati. She was  admitted on 08/30/2022 from home with ongoing fatigue and lower extremity weakness s/p fall.  Recent imaging identified severe cervical cord compression, multiple pathological compression fractures, severe L4-5 spinal stenosis.  Weakness which is now progressed to paralysis.  Patient is not a surgical candidate per neurosurgery.  She is currently undergoing high-dose steroids and radiation therapy.   Code Status: Full code  Assessment Mrs. Marseille and her husband continues to express hopes of improvement. Patient is tearful today asking "why isn't things better!". Continues to state "It has to get better, this is temporary until told otherwise!" Emotional support provided. Her husband offers support expressing to patient that this is the hope but have to take things one day at a time. Patient is emotional expressing "this is not her and she can not live this way!" She reaches for my hand.   I sit and offer silent support. At appropriate time I had open discussion with patient sharing the importance of discussing best case and worst case scenario. She shares she is not interested in discussing worst case because that is not an option however understands and willing to talk. I had open and honest discussion regarding her quality of life and what is most important to her. Jancy states she is relying on God's healing. She is respectful expressing her understanding that things may not improve or get better however she is not ready to give up. Speaking to her strong faith.  She begins to pray asking God what she had done and to have mercy on her knowing her prayers for a miracle. Emotional support provided.   Husband appreciative of support and presence. Patient ask that we see her daily for support and familiar faces. Understands we will support and assist as they navigate this difficult situation requiring complex decisions.   All symptoms are controlled. RN to administer ativan for anxiety.    Recommendations/Plan: Full Code/Full Scope Spiritual support referral  Continue with current plan of care per medical team  Ongoing goals of care discussions PMT will continue to support and follow. Please secure chat for urgent needs.   Symptom Management: Neoplasm related pain Gabapentin 300 mg at bedtime Hydromorphone 4 mg every 6 hours as needed for breakthrough pain MS Contin 30 mg every 12 hours Constipation MiraLAX twice daily Senna 1 tab twice daily Anxiety Lorazepam 0.5 mg every 4 hours as needed for anxiety  Thank you for allowing the Palliative Medicine Team to assist in the care of this patient.  Palliative Medicine Team providers are available by phone from 7am to 7pm daily and can be reached through the team cell phone. Should this patient require assistance outside of these hours, please call the patient's attending physician.  Any controlled substances utilized were prescribed in the context of palliative care. PDMP has been reviewed.  Visit consisted of counseling and education dealing with the complex and emotionally intense issues of symptom management and palliative care in the setting of serious and potentially life-threatening illness.Greater than 50%  of this time was spent counseling and coordinating care related to the above assessment and plan.  Willette Alma, AGPCNP-BC  Pall[facility]  [phone]  *Please note that this is a verbal dictation therefore any spelling or grammatical errors are due to the "Dragon Medical One" system interpretation.

## 2022-09-06 NOTE — TOC Progression Note (Signed)
Transition of Care [facility]) - Progression Note    Patient Details  Name: Kristi Jennings MRN: [id number] Date of Birth: 21-Mar-1946  Transition [facility]) CM/SW Contact  Larrie Kass, LCSW Phone Number: 09/06/2022, 4:08 PM  Clinical Narrative:    CSW spoke with pt's husband in [place] about his concerns with bed offers. Pt reported wanting pt to be close to where they live in [place]. CSW him of pt's bed offers.CSW informed him he can visit the facility to help him make a decision, as well as reviewing medicare.gov information. Pt inquired about LTC placement. CSW provided information on LTC and what pt will need for placement. CSW has attached Social Services resources to pt's AVS. TOC to follow up with pt on facility choice.    Expected Discharge Plan: Skilled Nursing Facility Barriers to Discharge: Continued Medical Work up  Expected Discharge Plan and Services   Discharge Planning Services: CM Consult   Living arrangements for the past 2 months: Single Family Home                                       Social Determinants of Health (SDOH) Interventions SDOH Screenings   Food Insecurity: No Food Insecurity (08/31/2022)  Housing: Low Risk  (08/31/2022)  Transportation Needs: No Transportation Needs (08/31/2022)  Utilities: Not At Risk (08/31/2022)  Depression (PHQ2-9): Low Risk  (10/11/2021)  Tobacco Use: Medium Risk (09/01/2022)    Readmission Risk Interventions    01/06/2021    1:42 PM  Readmission Risk Prevention Plan  Transportation Screening Complete  PCP or Specialist Appt within 3-5 Days Complete  HRI or Home Care Consult Complete  Social Work Consult for Recovery Care Planning/Counseling Complete  Palliative Care Screening Complete  Medication Review Oceanographer) Complete

## 2022-09-06 NOTE — TOC Progression Note (Signed)
Transition of Care [facility]) - Progression Note    Patient Details  Name: Kristi Jennings MRN: [id number] Date of Birth: 1945-10-04  Transition of Care [facility]) CM/SW Contact  Geni Bers, RN Phone Number: 09/06/2022, 11:21 AM  Clinical Narrative:     Checked with [name] again for a bed. [name] is full, no beds are available. Mr. Hurney asked that she be placed on a waiting list at [facility]. [facility] [place] states there is no waiting list, that Mr.Bassinger could call back next week.   Expected Discharge Plan: Skilled Nursing Facility Barriers to Discharge: Continued Medical Work up  Expected Discharge Plan and Services   Discharge Planning Services: CM Consult   Living arrangements for the past 2 months: Single Family Home                                       Social Determinants of Health (SDOH) Interventions SDOH Screenings   Food Insecurity: No Food Insecurity (08/31/2022)  Housing: Low Risk  (08/31/2022)  Transportation Needs: No Transportation Needs (08/31/2022)  Utilities: Not At Risk (08/31/2022)  Depression (PHQ2-9): Low Risk  (10/11/2021)  Tobacco Use: Medium Risk (09/01/2022)    Readmission Risk Interventions    01/06/2021    1:42 PM  Readmission Risk Prevention Plan  Transportation Screening Complete  PCP or Specialist Appt within 3-5 Days Complete  HRI or Home Care Consult Complete  Social Work Consult for Recovery Care Planning/Counseling Complete  Palliative Care Screening Complete  Medication Review Oceanographer) Complete

## 2022-09-07 ENCOUNTER — Ambulatory Visit
Admit: 2022-09-07 | Discharge: 2022-09-07 | Disposition: A | Discharge status: Home / Self Care | Payer: Medicare HMO | Attending: Radiation Oncology | Admitting: Radiation Oncology

## 2022-09-07 ENCOUNTER — Other Ambulatory Visit: Payer: Self-pay

## 2022-09-07 DIAGNOSIS — Z515 Encounter for palliative care: Secondary | ICD-10-CM | POA: Diagnosis not present

## 2022-09-07 DIAGNOSIS — Z7189 Other specified counseling: Secondary | ICD-10-CM | POA: Diagnosis not present

## 2022-09-07 DIAGNOSIS — E871 Hypo-osmolality and hyponatremia: Secondary | ICD-10-CM | POA: Diagnosis not present

## 2022-09-07 DIAGNOSIS — R531 Weakness: Secondary | ICD-10-CM | POA: Diagnosis not present

## 2022-09-07 LAB — RAD ONC ARIA SESSION SUMMARY
Course Elapsed Days: 6
Plan Fractions Treated to Date: 4
Plan Prescribed Dose Per Fraction: 3 Gy
Plan Total Fractions Prescribed: 9
Plan Total Prescribed Dose: 27 Gy
Reference Point Dosage Given to Date: 12 Gy
Reference Point Session Dosage Given: 3 Gy
Session Number: 5

## 2022-09-07 LAB — CBC
HCT: 30.9 % — ABNORMAL LOW (ref 36.0–46.0)
Hemoglobin: 10.2 g/dL — ABNORMAL LOW (ref 12.0–15.0)
MCH: 29.6 pg (ref 26.0–34.0)
MCHC: 33 g/dL (ref 30.0–36.0)
MCV: 89.6 fL (ref 80.0–100.0)
Platelets: 174 10*3/uL (ref 150–400)
RBC: 3.45 MIL/uL — ABNORMAL LOW (ref 3.87–5.11)
RDW: 12.7 % (ref 11.5–15.5)
WBC: 15.1 10*3/uL — ABNORMAL HIGH (ref 4.0–10.5)
nRBC: 0 % (ref 0.0–0.2)

## 2022-09-07 LAB — BASIC METABOLIC PANEL
Anion gap: 8 (ref 5–15)
BUN: 27 mg/dL — ABNORMAL HIGH (ref 8–23)
CO2: 29 mmol/L (ref 22–32)
Calcium: 7.8 mg/dL — ABNORMAL LOW (ref 8.9–10.3)
Chloride: 91 mmol/L — ABNORMAL LOW (ref 98–111)
Creatinine, Ser: 0.62 mg/dL (ref 0.44–1.00)
GFR, Estimated: >60 mL/min (ref >60)
Glucose, Bld: 129 mg/dL — ABNORMAL HIGH (ref 70–99)
Potassium: 4.7 mmol/L (ref 3.5–5.1)
Sodium: 128 mmol/L — ABNORMAL LOW (ref 135–145)

## 2022-09-07 LAB — GLUCOSE, CAPILLARY
Glucose-Capillary: 117 mg/dL — ABNORMAL HIGH (ref 70–99)
Glucose-Capillary: 114 mg/dL — ABNORMAL HIGH (ref 70–99)
Glucose-Capillary: 148 mg/dL — ABNORMAL HIGH (ref 70–99)
Glucose-Capillary: 170 mg/dL — ABNORMAL HIGH (ref 70–99)

## 2022-09-07 NOTE — Progress Notes (Signed)
Physical Therapy Treatment Patient Details Name: Kristi Jennings MRN: [id number] DOB: 1945/12/06 Today's Date: 09/07/2022   History of Present Illness 77 y.o. female with medical history significant of  pre diabetes, HLD, Hypertension, Polymyalgia rheumatica , Pulmonary emboli on OAC,  Ascending aortic aneurysm,DJD of the spine, Stage IV (T3, N2, M1 C) non-small cell lung cancer with bone and right adrenal metastasis followed by Dr. [name] s/p radiation , palliative chemo s/p 18 cycles . Of note patient last chemo cycle was 12/2020, treatment stopped due to disease progression  Patient has had increasing weakness  and fatigue at home x 1 week with associated muscle twitching. Dx of hyponatremia, imaging shows likely brain mets, LLE paresis, RLE weakness.    PT Comments    Total assist +2 to transfer pt from supine to sitting EOB. Tolerated sitting EOB 10 minutes. Total trunk support needed. Assisted pt to do cross lateral UE exercises, functional tasks, and reaching activities with the table. Assisted pt to turn side to side lateral cervical rotation, then roll shoulders back and forth. Total assist +2 to transfer back sit to supine. Performed MM testing B LE remains 0/5.  Recommendations for follow up therapy are one component of a multi-disciplinary discharge planning process, led by the attending physician.  Recommendations may be updated based on patient status, additional functional criteria and insurance authorization.  Follow Up Recommendations  Can patient physically be transported by private vehicle: No    Assistance Recommended at Discharge Frequent or constant Supervision/Assistance  Patient can return home with the following Two people to help with walking and/or transfers;A lot of help with bathing/dressing/bathroom;Assistance with cooking/housework;Direct supervision/assist for medications management;Assist for transportation;Help with stairs or ramp for entrance;Direct  supervision/assist for financial management   Equipment Recommendations  Wheelchair (measurements PT);Wheelchair cushion (measurements PT)    Recommendations for Other Services       Precautions / Restrictions Precautions Precautions: Fall Precaution Comments: fell on day of admission, denies other falls in past 6 months. T2- T4 mass causing paraparesis Restrictions Weight Bearing Restrictions: No     Mobility  Bed Mobility Overal bed mobility: Needs Assistance Bed Mobility: Supine to Sit, Sit to Supine Rolling: +2 for physical assistance, Total assist   Supine to sit: HOB elevated, Total assist, +2 for physical assistance Sit to supine: +2 for physical assistance, +2 for safety/equipment, Total assist   General bed mobility comments: TOTAL A +2 to scoot to EOB using bed pad.    Transfers                        Ambulation/Gait                   Stairs             Wheelchair Mobility    Modified Rankin (Stroke Patients Only)       Balance                                            Cognition Arousal/Alertness: Awake/alert (Sleepy/groggy from meds) Behavior During Therapy: WFL for tasks assessed/performed Overall Cognitive Status: Impaired/Different from baseline Area of Impairment: Memory, Problem solving                 Orientation Level: Time   Memory: Decreased short-term memory Following Commands: Follows one step commands with increased time  Awareness: Emergent Problem Solving: Requires verbal cues, Decreased initiation, Slow processing General Comments: stated it is July 2023, oriented to self and to location, correctly stated current president        Exercises      General Comments        Pertinent Vitals/Pain Pain Assessment Pain Assessment: 0-10 Pain Score: 0-No pain Pain Intervention(s): Monitored during session, Repositioned    Home Living                           Prior Function            PT Goals (current goals can now be found in the care plan section) Acute Rehab PT Goals Patient Stated Goal: to get stronger PT Goal Formulation: With patient/family Time For Goal Achievement: 09/14/22 Potential to Achieve Goals: Fair Progress towards PT goals: Progressing toward goals    Frequency    Min 1X/week      PT Plan Current plan remains appropriate    Co-evaluation              AM-PAC PT "6 Clicks" Mobility   Outcome Measure  Help needed turning from your back to your side while in a flat bed without using bedrails?: Total Help needed moving from lying on your back to sitting on the side of a flat bed without using bedrails?: Total Help needed moving to and from a bed to a chair (including a wheelchair)?: Total Help needed standing up from a chair using your arms (e.g., wheelchair or bedside chair)?: Total Help needed to walk in hospital room?: Total Help needed climbing 3-5 steps with a railing? : Total 6 Click Score: 6    End of Session   Activity Tolerance: Patient tolerated treatment well Patient left: in bed;with bed alarm set;with call bell/phone within reach   PT Visit Diagnosis: Difficulty in walking, not elsewhere classified (R26.2);Other abnormalities of gait and mobility (R26.89);History of falling (Z91.81)     Time:  -     Charges:                        Sharlene Motts, SPTA

## 2022-09-07 NOTE — Care Management Important Message (Signed)
Important Message  Patient Details IM Letter given. Name: GELENE POSTLE MRN: [id number] Date of Birth: 23-Feb-1946   Medicare Important Message Given:  Yes     Caren Macadam 09/07/2022, 10:52 AM

## 2022-09-07 NOTE — Progress Notes (Signed)
PROGRESS NOTE    Kristi Jennings  ZOX:[id number] DOB: 1945-12-27 DOA: 08/30/2022 PCP: [name], NP   Brief Narrative: Kristi Jennings is a 77 y.o. 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 Patient presented secondary to progressive weakness and fatigue with associated sodium of 123, presumed secondary to her oral chemotherapy and dehydration. Patient started on IV fluids and CT imaging concerning for new metastasis. MRI thoracic and lumbar spine with multiple areas of pathologic fractures in addition to spinal stenosis and soft tissue mass involving the left paravertebral soft tissues at T2-T4 and causing probable cord compression resulting in paraplegia. Emergent radiation therapy started. MRI brain identified multiple punctate infarcts; neurology consulted. Patient started emergent XRT on 4/12.  Assessment and Plan:  Severe spinal stenosis secondary to metastatic/pathologic fractures(POA, multiple) Bilateral lower extremity weakness/paralysis - Likely related to thoracic mass involving the spinal canal at T2-T4 with resultant severe spinal stenosis but unclear evidence of cord compression. History of severe cervical cord compression in 2022. CT head with concern for possible brain metastasis, which is ruled out on non-contrast MRI. MRI thoracic/lumbar spine with multiple pathologic compression fractures and severe spinal stenosis noted at L4-5. Weakness has now progressed to flaccid paralysis. Neurosurgery consulted but recommend no surgical management secondary to poor patient candidacy.  -Radiation oncology recommendations: XRT and high dose steroids (per 4/12 discussion). -Discussed with oncology, Dr. Shirline Frees -he spoke with patient and family, plan is to resume chemotherapy  as previously scheduled once discharged from the hospital. -PT/OT recommending SNF.  Chest pain, chronic Likely related to known metastatic bony lesions. Involvement of the manubrium.  -Analgesics PRN  Chronic pulmonary embolism -Continue Eliquis  Acute punctate CVA, multiple Multiple punctate infarcts noted on MRI. Patient is on Eliquis for pulmonary embolism. No evidence of hemorrhage. LDL of 80. Hemoglobin A1C of 5.2%. Echo significant for no intracardiac source of embolism or interatrial shunt.   -Continue Eliquis  Metastatic non-small cell lung cancer Bone metastasis Right adrenal metastasis Patient follows with Dr. [name]. Recently on oral chemotherapy which was held secondary to hyponatremia and dehydration. Patient seen by her primary oncologist.  Multiple vertebral compression fractures Noted on prior imaging. Likely pathologic fractures from metastasis.  Symptomatic hyponatremia, likely multifactorial Acute on chronic, improving with fluids patient also with metastatic cancer.  TSH normal.  Urine sodium inconsistent with dehydration; continue salt tabs, fluid restriction  Ascending aortic aneurysm Noted. Blood pressure mostly normotensive.  Repeat routine imaging 3 months per guidelines  Prediabetes Hemoglobin A1C is 5.2%  Primary hypertension -Continue home atenolol  Polymyalgia rheumatica -Continue home gabapentin  Cancer related pain Chronic pain -Continue home MS Contin and dilaudid -Miralax and Senokot-S  Underweight Severe malnutrition Secondary to chronic illness. Estimated body mass index is 15.98 kg/m as calculated from the following:   Height as of this encounter:  (1.676 m).   Weight as of this encounter: 44.9 kg.  Pressure injury Mid coccyx. Present on admission.   DVT prophylaxis: Eliquis Code Status:   Code Status: Full Code Family Communication: None at bedside. Disposition Plan: Discharge to SNF pending continued radiation  oncology recommendations   Consultants:  Neurosurgery Neurology Medical oncology Radiation oncology  Procedures:  4/13: Transthoracic Echocardiogram  Antimicrobials: None    Subjective: No issues noted overnight. Patient underwent XRT this morning.  Objective: BP (!) 161/73 (BP Location:  Right Arm)   Pulse (!) 53   Temp 98.6 F (37 C) (Oral)   Resp 16   Ht 5\' 6"  (1.676 m)   Wt 44.9 kg   SpO2 97%   BMI 15.98 kg/m   Examination:  General:  Pleasantly resting in bed, No acute distress. HEENT:  Normocephalic atraumatic.  Sclerae nonicteric, noninjected.  Extraocular movements intact bilaterally. Neck:  Without mass or deformity.  Trachea is midline. Lungs:  Clear to auscultate bilaterally without rhonchi, wheeze, or rales. Heart:  Regular rate and rhythm.  Without murmurs, rubs, or gallops. Abdomen:  Soft, nontender, nondistended. Extremities: Bilateral lower extremity proximal weakness/distal paralysis with decreased sensation  Data Reviewed: I have personally reviewed following labs and imaging studies  CBC Lab Results  Component Value Date   WBC 15.1 (H) 09/07/2022   RBC 3.45 (L) 09/07/2022   HGB 10.2 (L) 09/07/2022   HCT 30.9 (L) 09/07/2022   MCV 89.6 09/07/2022   MCH 29.6 09/07/2022   PLT 174 09/07/2022   MCHC 33.0 09/07/2022   RDW 12.7 09/07/2022   LYMPHSABS 1.1 08/30/2022   MONOABS 1.4 (H) 08/30/2022   EOSABS 0.1 08/30/2022   BASOSABS 0.0 08/30/2022     Last metabolic panel Lab Results  Component Value Date   NA 128 (L) 09/07/2022   K 4.7 09/07/2022   CL 91 (L) 09/07/2022   CO2 29 09/07/2022   BUN 27 (H) 09/07/2022   CREATININE 0.62 09/07/2022   GLUCOSE 129 (H) 09/07/2022   GFRNONAA >60 09/07/2022   CALCIUM 7.8 (L) 09/07/2022   PROT 6.4 (L) 08/30/2022   ALBUMIN 3.3 (L) 08/30/2022   BILITOT 0.7 08/30/2022   ALKPHOS 56 08/30/2022   AST 31 08/30/2022   ALT 16 08/30/2022   ANIONGAP 8 09/07/2022    GFR: Estimated Creatinine Clearance:  43.1 mL/min (by C-G formula based on SCr of 0.62 mg/dL).  No results found for this or any previous visit (from the past 240 hour(s)).    Radiology Studies: No results found.    LOS: 8 days    Carma Leaven DO [facility] 09/07/2022, 7:47 AM   If 7PM-7AM, please contact night-coverage [email or web address]

## 2022-09-07 NOTE — Progress Notes (Signed)
Daily Progress Note   Patient Name: Kristi Jennings       Date: 09/07/2022 DOB: Dec 29, 1945  Age: 77 y.o. MRN#: [id number] Attending Physician: Azucena Fallen, MD Primary Care Physician: [name], NP Admit Date: 08/30/2022  Reason for Consultation/Follow-up: Establishing goals of care, non-pain symptom management, and pain control  Patient Profile/HPI: Palliative Care consult requested for goals of care discussion in this 77 y.o. female  with past medical history of stage IV non-small cell lung cancer with bone and right adrenal metastasis s/p radiation, palliative chemo (discontinued due to progression), immunotherapy, currently on oral Krazati. She was admitted on 08/30/2022 from home with ongoing fatigue and lower extremity weakness s/p fall.  Recent imaging identified severe cervical cord compression, multiple pathological compression fractures, severe L4-5 spinal stenosis.  Weakness which is now progressed to paralysis.  Patient is not a surgical candidate per neurosurgery.  She is currently undergoing high-dose steroids and radiation therapy.    Subjective: Review of EMR and discussion with bedside nursing staff prior to visit. Staff report that patient's husband has just left and that she is tearful. Received Ativan at 1549.  Presented to bedside for visit and patient is observed alone. She begins to cry out, "I am afraid, I am so scared." When holding patient's hand she says, "Thank you for touch, touch, touch, good touch." She demonstrates understanding of what is happening around her, however, is noted to be repetitive. She asks for "wet cloth - wet, wet, wet."   Reassurances provided to Kristi Jennings that she is not alone. She asks, "Help me breathe - help me - help me breathe."  Kristi Jennings wearing oxygen via nasal cannula and is encouraged to take deep breaths in through her nose and out through her mouth. She is able to do this and begins to relax.    Physical Exam: Gen:  NAD, ill-appearing, circumorbital darkening  CV: Regular rate and rhythm PULM: clear to auscultation bilaterally ABD: soft, normal bowel sounds EXT: no edema, flaccid paralysis Neuro: Alert and oriented x3, some memory deficit, repetitive speech    Vital Signs: BP (!) 150/70 (BP Location: Right Arm)   Pulse (!) 50   Temp 98.3 F (36.8 C) (Oral)   Resp 18   Ht 5\' 6"  (1.676 m)   Wt 44.9 kg  SpO2 99%   BMI 15.98 kg/m  SpO2: SpO2: 99 % O2 Device: O2 Device: Nasal Cannula O2 Flow Rate: O2 Flow Rate (L/min): 2 L/min  Intake/output summary:  Intake/Output Summary (Last 24 hours) at 09/07/2022 1704 Last data filed at 09/07/2022 1300 Gross per 24 hour  Intake 290 ml  Output 2100 ml  Net -1810 ml   LBM: Last BM Date : 09/06/22 Baseline Weight: Weight: 42.6 kg Most recent weight: Weight: 44.9 kg   Patient Active Problem List   Diagnosis Date Noted   Hyponatremia 08/30/2022   Palliative care patient 05/12/2021   Muscle ache 04/13/2021   Metastatic bone tumor 01/20/2021   Pulmonary emboli 01/01/2021   Compression fracture of T12 vertebra 01/01/2021   Lumbar radiculopathy 01/01/2021   Non-small cell carcinoma of lung, stage 4, right 11/30/2020   Encounter for antineoplastic chemotherapy 11/30/2020   Encounter for antineoplastic immunotherapy 11/30/2020   Cancer associated pain 11/30/2020   Malnutrition, calorie 11/30/2020   Polymyalgia rheumatica 08/12/2019   Allergic rhinitis 06/10/2015   Anxiety 06/10/2015   Benign essential hypertension 06/10/2015    Palliative Care Assessment & Plan    Assessment/Recommendations/Plan  Full code/Full scope as per most recent palliative team discussion with patient and family. Ongoing goals of care discussions. Appreciate continued  involvement of spiritual care for emotional and spiritual support. Continue with current plan of care as per medical team. For pain: Gabapentin 300 mg at bedtime, MS Contin 30 mg every 12 hours, and Dilaudid 4 mg every 6 hours as needed for breakthrough pain. For constipation: Miralax twice daily and Senna 1 tab twice daily. For anxiety: Ativan 0.5 mg every 4 hours as needed.   Code Status: Full code  Prognosis:  Guarded-Poor   Discharge Planning: Skilled Nursing Facility  Care plan was discussed with patient, bedside nursing staff, and palliative care team.   Thank you for allowing the Palliative Medicine Team to assist in the care of this patient.  I assessed patient with Marylene Land, NP Student. Agree with above findings.   Any controlled substances utilized were prescribed in the context of palliative care. PDMP has been reviewed.    Visit consisted of counseling and education dealing with the complex and emotionally intense issues of symptom management and palliative care in the setting of serious and potentially life-threatening illness.Greater than 50%  of this time was spent counseling and coordinating care related to the above assessment and plan.   Signed by: Katy Apo, RN MSN [facility] / NP Student Willette Alma, AGPCNP-BC  Palliative Medicine Team/[facility]  *Please note that this is a verbal dictation therefore any spelling or grammatical errors are due to the "Dragon Medical One" system interpretation.   Please contact Palliative Medicine Team phone at [phone] for questions and concerns. Palliative Medicine Team providers are available by phone from 7am to 7pm daily. Should this patient require assistance outside of these hours, please call the patient's attending physician.

## 2022-09-07 NOTE — Progress Notes (Signed)
Chaplain attempted again to see Lavern but she was sleeping due to just having pain medicine. Chaplain was able to engage in initial visit with family at bedside. Chaplain offered support and let her know how to have Chaplain contacted once Vasti is awake.     09/07/22 1400  Spiritual Encounters  Type of Visit Follow up  Care provided to: Pt not available

## 2022-09-07 NOTE — TOC Progression Note (Signed)
Transition of Care [facility]) - Progression Note    Patient Details  Name: Kristi Jennings MRN: [id number] Date of Birth: [birth year]  [facility] of [facility]) CM/SW Contact  Larrie Kass, LCSW Phone Number: 09/07/2022, 11:54 AM  Clinical Narrative:     CSW received a call from pt's husband , he repots calling his wife insurance company about SNF facilities in-network. He is requesting pt's information to be sent out to [employer]. CSW has faxed pt's information out. TOC to follow.   Expected Discharge Plan: Skilled Nursing Facility Barriers to Discharge: Continued Medical Work up  Expected Discharge Plan and Services   Discharge Planning Services: CM Consult   Living arrangements for the past 2 months: Single Family Home                                       Social Determinants of Health (SDOH) Interventions SDOH Screenings   Food Insecurity: No Food Insecurity (08/31/2022)  Housing: Low Risk  (08/31/2022)  Transportation Needs: No Transportation Needs (08/31/2022)  Utilities: Not At Risk (08/31/2022)  Depression (PHQ2-9): Low Risk  (10/11/2021)  Tobacco Use: Medium Risk (09/01/2022)    Readmission Risk Interventions    01/06/2021    1:42 PM  Readmission Risk Prevention Plan  Transportation Screening Complete  PCP or Specialist Appt within 3-5 Days Complete  HRI or Home Care Consult Complete  Social Work Consult for Recovery Care Planning/Counseling Complete  Palliative Care Screening Complete  Medication Review Oceanographer) Complete

## 2022-09-08 ENCOUNTER — Other Ambulatory Visit: Payer: Self-pay

## 2022-09-08 ENCOUNTER — Ambulatory Visit
Admit: 2022-09-08 | Discharge: 2022-09-08 | Disposition: A | Discharge status: Home / Self Care | Payer: Medicare HMO | Attending: Radiation Oncology | Admitting: Radiation Oncology

## 2022-09-08 ENCOUNTER — Telehealth: Payer: Self-pay

## 2022-09-08 ENCOUNTER — Other Ambulatory Visit (HOSPITAL_COMMUNITY): Payer: Self-pay

## 2022-09-08 DIAGNOSIS — E871 Hypo-osmolality and hyponatremia: Secondary | ICD-10-CM | POA: Diagnosis not present

## 2022-09-08 LAB — RAD ONC ARIA SESSION SUMMARY
Course Elapsed Days: 7
Plan Fractions Treated to Date: 5
Plan Prescribed Dose Per Fraction: 3 Gy
Plan Total Fractions Prescribed: 9
Plan Total Prescribed Dose: 27 Gy
Reference Point Dosage Given to Date: 15 Gy
Reference Point Session Dosage Given: 3 Gy
Session Number: 6

## 2022-09-08 LAB — GLUCOSE, CAPILLARY
Glucose-Capillary: 112 mg/dL — ABNORMAL HIGH (ref 70–99)
Glucose-Capillary: 147 mg/dL — ABNORMAL HIGH (ref 70–99)
Glucose-Capillary: 125 mg/dL — ABNORMAL HIGH (ref 70–99)

## 2022-09-08 NOTE — Progress Notes (Signed)
PROGRESS NOTE    Kristi Jennings  ZOX:[id number] DOB: June 16, 1945 DOA: 08/30/2022 PCP: [name], NP   Brief Narrative: Kristi Jennings is a 77 y.o. 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 Patient presented secondary to progressive weakness and fatigue with associated sodium of 123, presumed secondary to her oral chemotherapy and dehydration. Patient started on IV fluids and CT imaging concerning for new metastasis. MRI thoracic and lumbar spine with multiple areas of pathologic fractures in addition to spinal stenosis and soft tissue mass involving the left paravertebral soft tissues at T2-T4 and causing probable cord compression resulting in paraplegia. Emergent radiation therapy started. MRI brain identified multiple punctate infarcts; neurology consulted. Patient started emergent XRT on 4/12.  Assessment and Plan:  Severe spinal stenosis secondary to metastatic/pathologic fractures(POA, multiple) Bilateral lower extremity weakness/paralysis - Likely related to thoracic mass involving the spinal canal at T2-T4 with resultant severe spinal stenosis but unclear evidence of cord compression. History of severe cervical cord compression in 2022. CT head with concern for possible brain metastasis, which is ruled out on non-contrast MRI. MRI thoracic/lumbar spine with multiple pathologic compression fractures and severe spinal stenosis noted at L4-5. Weakness has now progressed to flaccid paralysis. Neurosurgery consulted but recommend no surgical management secondary to poor patient candidacy.  -Radiation oncology recommendations: XRT and high dose steroids (per 4/12 discussion). -Discussed with oncology, Dr. Shirline Frees -he spoke with patient and family, plan is to resume chemotherapy  as previously scheduled once discharged from the hospital. -PT/OT recommending SNF.  Chest pain, chronic Likely related to known metastatic bony lesions. Involvement of the manubrium.  -Analgesics PRN  Chronic pulmonary embolism -Continue Eliquis  Acute punctate CVA, multiple Multiple punctate infarcts noted on MRI. Patient is on Eliquis for pulmonary embolism. No evidence of hemorrhage. LDL of 80. Hemoglobin A1C of 5.2%. Echo significant for no intracardiac source of embolism or interatrial shunt.   -Continue Eliquis  Metastatic non-small cell lung cancer Bone metastasis Right adrenal metastasis Patient follows with Dr. [name]. Recently on oral chemotherapy which was held secondary to hyponatremia and dehydration. Patient seen by her primary oncologist.  Multiple vertebral compression fractures Noted on prior imaging. Likely pathologic fractures from metastasis.  Symptomatic hyponatremia, likely multifactorial Acute on chronic, improving with fluids patient also with metastatic cancer.  TSH normal.  Urine sodium inconsistent with dehydration; continue salt tabs, fluid restriction  Ascending aortic aneurysm Noted. Blood pressure mostly normotensive.  Repeat routine imaging 3 months per guidelines  Prediabetes Hemoglobin A1C is 5.2%  Primary hypertension -Continue home atenolol  Polymyalgia rheumatica -Continue home gabapentin  Cancer related pain Chronic pain -Continue home MS Contin and dilaudid -Miralax and Senokot-S  Underweight Severe malnutrition Secondary to chronic illness. Estimated body mass index is 15.73 kg/m as calculated from the following:   Height as of this encounter:  (1.676 m).   Weight as of this encounter: 44.2 kg.  Pressure injury Mid coccyx. Present on admission.  DVT prophylaxis: Eliquis Code Status:   Code Status: Full Code Family Communication: Husband and friend at bedside Disposition Plan: Discharge to SNF pending continued  radiation oncology recommendations   Consultants:  Neurosurgery Neurology Medical oncology Radiation oncology  Procedures:  4/13: Transthoracic Echocardiogram  Antimicrobials: None   Subjective: No issues noted overnight.  Continues to tolerate XRT well, lengthy discussion today at bedside with  family about goals of care and disposition planning.  Objective: BP (!) 154/61 (BP Location: Left Arm)   Pulse (!) 57   Temp 99 F (37.2 C) (Oral)   Resp 18   Ht  (1.676 m)   Wt 44.2 kg   SpO2 100%   BMI 15.73 kg/m   Examination:  General:  Pleasantly resting in bed, No acute distress. HEENT:  Normocephalic atraumatic.  Sclerae nonicteric, noninjected.  Extraocular movements intact bilaterally. Neck:  Without mass or deformity.  Trachea is midline. Lungs:  Clear to auscultate bilaterally without rhonchi, wheeze, or rales. Heart:  Regular rate and rhythm.  Without murmurs, rubs, or gallops. Abdomen:  Soft, nontender, nondistended. Extremities: Bilateral lower extremity proximal weakness/distal paralysis with decreased sensation  Data Reviewed: I have personally reviewed following labs and imaging studies  CBC Lab Results  Component Value Date   WBC 15.1 (H) 09/07/2022   RBC 3.45 (L) 09/07/2022   HGB 10.2 (L) 09/07/2022   HCT 30.9 (L) 09/07/2022   MCV 89.6 09/07/2022   MCH 29.6 09/07/2022   PLT 174 09/07/2022   MCHC 33.0 09/07/2022   RDW 12.7 09/07/2022   LYMPHSABS 1.1 08/30/2022   MONOABS 1.4 (H) 08/30/2022   EOSABS 0.1 08/30/2022   BASOSABS 0.0 08/30/2022     Last metabolic panel Lab Results  Component Value Date   NA 128 (L) 09/07/2022   K 4.7 09/07/2022   CL 91 (L) 09/07/2022   CO2 29 09/07/2022   BUN 27 (H) 09/07/2022   CREATININE 0.62 09/07/2022   GLUCOSE 129 (H) 09/07/2022   GFRNONAA >60 09/07/2022   CALCIUM 7.8 (L) 09/07/2022   PROT 6.4 (L) 08/30/2022   ALBUMIN 3.3 (L) 08/30/2022   BILITOT 0.7 08/30/2022   ALKPHOS 56 08/30/2022   AST 31  08/30/2022   ALT 16 08/30/2022   ANIONGAP 8 09/07/2022    GFR: Estimated Creatinine Clearance: 42.4 mL/min (by C-G formula based on SCr of 0.62 mg/dL).  No results found for this or any previous visit (from the past 240 hour(s)).    Radiology Studies: No results found.    LOS: 9 days    Carma Leaven DO [facility] 09/08/2022, 7:39 AM   If 7PM-7AM, please contact night-coverage [email or web address]

## 2022-09-08 NOTE — TOC Progression Note (Addendum)
Transition of Care [facility]) - Progression Note    Patient Details  Name: Kristi Jennings MRN: [id number] Date of Birth: Jun 12, 1945  Transition of Care [facility]) CM/SW Contact  Adrian Prows, RN Phone Number: 09/08/2022, 9:26 AM  Clinical Narrative:   Checked to see if pt's info had been reviewed by facilities; Toniann Fail at [facility] says facility does not have any female beds; LVM for Bethena Roys at [facility]; Cody at [facility] says she has not received info; initial referral and FL2 sent via SNF HUB; awaiting responses.  -0930- Cody at [facility] confirmed receipt of documents; unable to offer bed because facility not fully operational with Monia Pouch; out of network..  -1016- called Bethena Roys, Admissions at [facility]; she says she will review pt and call this CM back w/ decision; awaiting return call.  -1216- per Bethena Roys at [facility], pt declined  -1230- spoke w/ pt in room; she would like to wait for her husband to be present to discuss bed offers  -1410- met w/ pt and husband [name] in discuss bed choice; explained last 3 options did not offer pt a bed; pt's husband says no one has discussed this with him, and he finds it hard to believe no one closer to [facility] will offer bed; explained SNF and bed choice process; also informed Mr [facility] Berkley Harvey must be obtained; explained this dept has no influence on who offers beds; the option of  maximized home [facility] services presented; he says pt cannot return home in this condition; explained physician determine medical stability; Mr [name] is upset because pt is to receive more radiation treatments; he would like to talk w/ her oncologist and hospitalist; he would also like for [facility] supervisor to be notified because no one has assisted him w/ this process; pt's friend Annice Pih present in room and requests that more time be given so a decision can be made for placement; Dr Natale Milch notified via secure chat.  -1458- Dr Si Gaul paged; awaiting return call.  -1527- notified by Dr Natale Milch pt/husband chose [facility]; notified [place] at facility (ext [phone]); he will confirm facility will be able to transport pt to radiation treatments and notify this CM; also facility will obtain auth; awaiting return call.  -1545- notified by Charlcie Cradle, intake at [facility] pt transport to radiation can be done if pt able able to transport in wheelchair, not stretcher.   Expected Discharge Plan: Skilled Nursing Facility Barriers to Discharge: Continued Medical Work up  Expected Discharge Plan and Services   Discharge Planning Services: CM Consult   Living arrangements for the past 2 months: Single Family Home                                       Social Determinants of Health (SDOH) Interventions SDOH Screenings   Food Insecurity: No Food Insecurity (08/31/2022)  Housing: Low Risk  (08/31/2022)  Transportation Needs: No Transportation Needs (08/31/2022)  Utilities: Not At Risk (08/31/2022)  Depression (PHQ2-9): Low Risk  (10/11/2021)  Tobacco Use: Medium Risk (09/01/2022)    Readmission Risk Interventions    01/06/2021    1:42 PM  Readmission Risk Prevention Plan  Transportation Screening Complete  PCP or Specialist Appt within 3-5 Days Complete  HRI or Home Care Consult Complete  Social Work Consult for Recovery Care Planning/Counseling Complete  Palliative Care Screening Complete  Medication Review Oceanographer) Complete

## 2022-09-08 NOTE — Progress Notes (Signed)
Occupational Therapy Treatment Patient Details Name: Kristi Jennings MRN: [id number] DOB: 04/21/1946 Today's Date: 09/08/2022   History of present illness 77 y.o. female with medical history significant of  pre diabetes, HLD, Hypertension, Polymyalgia rheumatica , Pulmonary emboli on OAC,  Ascending aortic aneurysm,DJD of the spine, Stage IV (T3, N2, M1 C) non-small cell lung cancer with bone and right adrenal metastasis followed by Dr. [name] s/p radiation , palliative chemo s/p 18 cycles . Of note patient last chemo cycle was 12/2020, treatment stopped due to disease progression  Patient has had increasing weakness  and fatigue at home x 1 week with associated muscle twitching. Dx of hyponatremia, imaging shows likely brain mets, LLE paresis, RLE weakness.   OT comments  Patient did progress with tolerance to EOB sitting for >10 min with Max As and weak core strength with pt flexed trunk, profoundly weak.  Pt regressed with overall ADLs perhaps being seen later in day and already fatigued.  Decreased overall ADLs performance as evidenced by an increased score on 6-Clicks AM-PAC measure of occupational Performance with previous score of 13/24, and current score of 10/24. Patient remains very motivated, "I will BEAT this!", and is currently limited by pain to mid-back and chest, profound generalized weakness and decreased activity tolerance along with deficits noted below. Pt continues to demonstrate fair to good rehab potential and would benefit from continued skilled OT to increase safety and independence with ADLs and functional transfers to allow pt to return home safely and reduce caregiver burden and fall risk.    Recommendations for follow up therapy are one component of a multi-disciplinary discharge planning process, led by the attending physician.  Recommendations may be updated based on patient status, additional functional criteria and insurance authorization.    Assistance Recommended at  Discharge Frequent or constant Supervision/Assistance  Patient can return home with the following  Help with stairs or ramp for entrance;Assistance with feeding;Two people to help with walking and/or transfers;A lot of help with bathing/dressing/bathroom;Direct supervision/assist for financial management;Assist for transportation;Assistance with cooking/housework   Equipment Recommendations  BSC/3in1;Wheelchair (measurements OT);Wheelchair cushion (measurements OT)    Recommendations for Other Services      Precautions / Restrictions Precautions Precautions: Fall Precaution Comments: fell on day of admission, denies other falls in past 6 months. T2- T4 mass causing paraparesis Restrictions Weight Bearing Restrictions: No       Mobility Bed Mobility Overal bed mobility: Needs Assistance Bed Mobility: Supine to Sit, Sit to Supine     Supine to sit: Total assist Sit to supine: Total assist, +2 for physical assistance   General bed mobility comments: TOTAL A +2 to scoot to EOB using bed pad.  Pt trying to assist with bed mobility but profoundly weak.    Transfers                         Balance Overall balance assessment: Needs assistance Sitting-balance support: Bilateral upper extremity supported, Feet supported Sitting balance-Leahy Scale: Poor Sitting balance - Comments: requires MOD-MAX external support to maintain upright/midline seated balance. Worked on gentle extension stretch to back to allow pt to sit upright as tolerated. Postural control: Posterior lean, Right lateral lean                                 ADL either performed or assessed with clinical judgement   ADL Overall ADL's : Needs assistance/impaired  Eating/Feeding Details (indicate cue type and reason): Spouse hand feeding Grooming: Brushing hair;Sitting;Total assistance Grooming Details (indicate cue type and reason): Pt became too fatigued at EOB to comb hair. Total Assist  provided.                                    Extremity/Trunk Assessment Upper Extremity Assessment RUE Deficits / Details: able to move against gravity, grossly 3+/5 LUE Deficits / Details: able to move against gravity, grossly 3+/5   Lower Extremity Assessment RLE Sensation: decreased light touch;decreased proprioception LLE Deficits / Details: no active ankle PF/DF, no active participation with heel slides, pt somewhat lethargic and didn't seem to understand commands for strenth testing LLE Sensation: decreased light touch;decreased proprioception   Cervical / Trunk Assessment Cervical / Trunk Assessment: Other exceptions;Kyphotic    Vision       Perception     Praxis      Cognition Arousal/Alertness: Awake/alert Behavior During Therapy: WFL for tasks assessed/performed                                   General Comments: Unable to delve into cognition due to family meeting with MD in room as OT worked with pt.        Exercises      Shoulder Instructions       General Comments      Pertinent Vitals/ Pain       Pain Assessment Pain Assessment: Faces Faces Pain Scale: Hurts little more Pain Location: mid-back while sitting; chest while assisted in supeine scoot to [facility] Pain Descriptors / Indicators: Sore, Grimacing, Moaning Pain Intervention(s): Limited activity within patient's tolerance, Monitored during session, Repositioned  Home Living                                          Prior Functioning/Environment              Frequency  Min 2X/week        Progress Toward Goals  OT Goals(current goals can now be found in the care plan section)  Progress towards OT goals: Not progressing toward goals - comment  Acute Rehab OT Goals OT Goal Formulation: With patient Time For Goal Achievement: 09/14/22 Potential to Achieve Goals: Fair  Plan Discharge plan remains appropriate    Co-evaluation                  AM-PAC OT "6 Clicks" Daily Activity     Outcome Measure   Help from another person eating meals?: A Lot Help from another person taking care of personal grooming?: A Lot Help from another person toileting, which includes using toliet, bedpan, or urinal?: Total Help from another person bathing (including washing, rinsing, drying)?: A Lot Help from another person to put on and taking off regular upper body clothing?: A Lot Help from another person to put on and taking off regular lower body clothing?: Total 6 Click Score: 10    End of Session    OT Visit Diagnosis: Unsteadiness on feet (R26.81);Muscle weakness (generalized) (M62.81);History of falling (Z91.81);Other symptoms and signs involving cognitive function   Activity Tolerance Patient tolerated treatment well   Patient Left in bed;with call bell/phone within reach   Nurse  Communication Mobility status (CNA assisted with return to bed and positioning)        Time: 1451-1525 OT Time Calculation (min): 34 min  Charges: OT General Charges $OT Visit: 1 Visit OT Treatments $Therapeutic Activity: 23-37 mins  Victorino Dike, OT Acute Rehab Services Office: [phone] 09/08/2022  Theodoro Clock 09/08/2022, 3:49 PM

## 2022-09-08 NOTE — Progress Notes (Signed)
Pending rads onc clearance to transfer (?if she can go back/forth from SNF daily if needed)

## 2022-09-09 DIAGNOSIS — E871 Hypo-osmolality and hyponatremia: Secondary | ICD-10-CM | POA: Diagnosis not present

## 2022-09-09 LAB — BASIC METABOLIC PANEL
Anion gap: 9 (ref 5–15)
BUN: 24 mg/dL — ABNORMAL HIGH (ref 8–23)
CO2: 28 mmol/L (ref 22–32)
Calcium: 7.8 mg/dL — ABNORMAL LOW (ref 8.9–10.3)
Chloride: 88 mmol/L — ABNORMAL LOW (ref 98–111)
Creatinine, Ser: 0.59 mg/dL (ref 0.44–1.00)
GFR, Estimated: >60 mL/min (ref >60)
Glucose, Bld: 120 mg/dL — ABNORMAL HIGH (ref 70–99)
Potassium: 4.8 mmol/L (ref 3.5–5.1)
Sodium: 125 mmol/L — ABNORMAL LOW (ref 135–145)

## 2022-09-09 LAB — CBC
HCT: 32.5 % — ABNORMAL LOW (ref 36.0–46.0)
Hemoglobin: 10.9 g/dL — ABNORMAL LOW (ref 12.0–15.0)
MCH: 29.1 pg (ref 26.0–34.0)
MCHC: 33.5 g/dL (ref 30.0–36.0)
MCV: 86.7 fL (ref 80.0–100.0)
Platelets: 250 10*3/uL (ref 150–400)
RBC: 3.75 MIL/uL — ABNORMAL LOW (ref 3.87–5.11)
RDW: 12.5 % (ref 11.5–15.5)
WBC: 17 10*3/uL — ABNORMAL HIGH (ref 4.0–10.5)
nRBC: 0 % (ref 0.0–0.2)

## 2022-09-09 LAB — GLUCOSE, CAPILLARY
Glucose-Capillary: 116 mg/dL — ABNORMAL HIGH (ref 70–99)
Glucose-Capillary: 100 mg/dL — ABNORMAL HIGH (ref 70–99)
Glucose-Capillary: 142 mg/dL — ABNORMAL HIGH (ref 70–99)

## 2022-09-09 NOTE — Progress Notes (Signed)
PROGRESS NOTE    Kristi Jennings  ZOX:[id number] DOB: Jan 12, 1946 DOA: 08/30/2022 PCP: [name], NP   Brief Narrative: Kristi Jennings is a 77 y.o. 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 Patient presented secondary to progressive weakness and fatigue with associated sodium of 123, presumed secondary to her oral chemotherapy and dehydration. Patient started on IV fluids and CT imaging concerning for new metastasis. MRI thoracic and lumbar spine with multiple areas of pathologic fractures in addition to spinal stenosis and soft tissue mass involving the left paravertebral soft tissues at T2-T4 and causing probable cord compression resulting in paraplegia. Emergent radiation therapy started. MRI brain identified multiple punctate infarcts; neurology consulted. Patient started emergent XRT on 4/12.  Patient remains medically stable for discharge, currently awaiting safe disposition to SNF -pending bed availability and insurance approval.  Outpatient follow-up with radiation oncology and hematology oncology as scheduled.  Assessment and Plan:  Severe spinal stenosis secondary to metastatic/pathologic fractures(POA, multiple) Bilateral lower extremity weakness/paralysis - Likely related to thoracic mass involving the spinal canal at T2-T4 with resultant severe spinal stenosis but unclear evidence of cord compression. History of severe cervical cord compression in 2022. CT head with concern for possible brain metastasis, which is ruled out on non-contrast MRI. MRI thoracic/lumbar spine with multiple pathologic compression fractures and severe spinal stenosis noted at L4-5. Weakness has now progressed to flaccid paralysis. Neurosurgery consulted but recommend no surgical management  secondary to poor patient candidacy.  -Radiation oncology recommendations: XRT and high dose steroids (per 4/12 discussion). -Discussed with oncology, Dr. Shirline Frees -he spoke with patient and family, plan is to resume chemotherapy as previously scheduled once discharged from the hospital. -PT/OT recommending SNF - medically stable for discharge, awaiting insurance approval and SNF bed availability  Goals of care  -Multiple discussions have been attempted over the past week with patient and family in regards to her prognosis. -Patient indicates " I am going to get better" and essentially shuts down all discussion regarding anything other than a complete cure.  It is unclear whether or not she fully understands the extent of her disease.  Discussed with husband and friend at bedside previously who appeared understand she would likely not regain full ambulatory status and given likely metastatic disease as above her prognosis is quite poor.  Chest pain, chronic Likely related to known metastatic bony lesions. Involvement of the manubrium.  -Analgesics PRN  Chronic pulmonary embolism -Continue Eliquis  Acute punctate CVA, multiple Multiple punctate infarcts noted on MRI. Patient is on Eliquis for pulmonary embolism. No evidence of hemorrhage. LDL of 80. Hemoglobin A1C of 5.2%. Echo significant for no intracardiac source of embolism or interatrial shunt.   -Continue Eliquis  Metastatic non-small cell lung cancer Bone metastasis Right adrenal metastasis Patient follows with Dr. [name]. Recently on oral chemotherapy which was held secondary to hyponatremia and dehydration -plan to resume after discharge per discussion with Dr. [name].  Multiple vertebral compression fractures Noted on prior imaging. Likely pathologic fractures from metastasis.  Symptomatic hyponatremia, likely multifactorial Acute on chronic, improving with fluids patient also with metastatic cancer.  TSH normal.  Urine  sodium inconsistent with dehydration; continue salt tabs, fluid restriction  Ascending aortic aneurysm Noted. Blood pressure mostly normotensive.  Repeat routine imaging 3 months per guidelines  Prediabetes Hemoglobin A1C is 5.2%  Primary hypertension -Continue  home atenolol  Polymyalgia rheumatica -Continue home gabapentin  Cancer related pain Chronic pain -Continue home MS Contin and dilaudid -Miralax and Senokot-S  Underweight Severe malnutrition Secondary to chronic illness. Estimated body mass index is 15.12 kg/m as calculated from the following:   Height as of this encounter:  (1.676 m).   Weight as of this encounter: 42.5 kg.  Pressure injury Mid coccyx. Present on admission.  DVT prophylaxis: Eliquis Code Status:   Code Status: Full Code Family Communication: Husband and friend at bedside Disposition Plan: Discharge to SNF pending  Consultants:  Neurosurgery Neurology Medical oncology Radiation oncology  Procedures:  4/13: Transthoracic Echocardiogram  Antimicrobials: None   Subjective: No issues noted overnight.  Patient continues to have profound anxiety in regards to her hospitalization  Objective: BP (!) 147/64 (BP Location: Right Arm)   Pulse (!) 48   Temp 97.7 F (36.5 C) (Oral)   Resp 14   Ht  (1.676 m)   Wt 42.5 kg   SpO2 100%   BMI 15.12 kg/m   Examination:  General:  Pleasantly resting in bed, No acute distress. HEENT:  Normocephalic atraumatic.  Sclerae nonicteric, noninjected.  Extraocular movements intact bilaterally. Neck:  Without mass or deformity.  Trachea is midline. Lungs:  Clear to auscultate bilaterally without rhonchi, wheeze, or rales. Heart:  Regular rate and rhythm.  Without murmurs, rubs, or gallops. Abdomen:  Soft, nontender, nondistended. Extremities: Bilateral lower extremity proximal weakness/distal paralysis with decreased sensation  Data Reviewed: I have personally reviewed following labs and  imaging studies  CBC Lab Results  Component Value Date   WBC 17.0 (H) 09/09/2022   RBC 3.75 (L) 09/09/2022   HGB 10.9 (L) 09/09/2022   HCT 32.5 (L) 09/09/2022   MCV 86.7 09/09/2022   MCH 29.1 09/09/2022   PLT 250 09/09/2022   MCHC 33.5 09/09/2022   RDW 12.5 09/09/2022   LYMPHSABS 1.1 08/30/2022   MONOABS 1.4 (H) 08/30/2022   EOSABS 0.1 08/30/2022   BASOSABS 0.0 08/30/2022     Last metabolic panel Lab Results  Component Value Date   NA 125 (L) 09/09/2022   K 4.8 09/09/2022   CL 88 (L) 09/09/2022   CO2 28 09/09/2022   BUN 24 (H) 09/09/2022   CREATININE 0.59 09/09/2022   GLUCOSE 120 (H) 09/09/2022   GFRNONAA >60 09/09/2022   CALCIUM 7.8 (L) 09/09/2022   PROT 6.4 (L) 08/30/2022   ALBUMIN 3.3 (L) 08/30/2022   BILITOT 0.7 08/30/2022   ALKPHOS 56 08/30/2022   AST 31 08/30/2022   ALT 16 08/30/2022   ANIONGAP 9 09/09/2022    GFR: Estimated Creatinine Clearance: 40.8 mL/min (by C-G formula based on SCr of 0.59 mg/dL).  No results found for this or any previous visit (from the past 240 hour(s)).    Radiology Studies: No results found.    LOS: 10 days    Carma Leaven DO [facility] 09/09/2022, 7:58 AM   If 7PM-7AM, please contact night-coverage [email or web address]

## 2022-09-10 DIAGNOSIS — E871 Hypo-osmolality and hyponatremia: Secondary | ICD-10-CM | POA: Diagnosis not present

## 2022-09-10 LAB — GLUCOSE, CAPILLARY
Glucose-Capillary: 114 mg/dL — ABNORMAL HIGH (ref 70–99)
Glucose-Capillary: 114 mg/dL — ABNORMAL HIGH (ref 70–99)
Glucose-Capillary: 137 mg/dL — ABNORMAL HIGH (ref 70–99)
Glucose-Capillary: 130 mg/dL — ABNORMAL HIGH (ref 70–99)

## 2022-09-10 MED ORDER — QUETIAPINE FUMARATE 25 MG PO TABS
50.0000 mg | ORAL_TABLET | Freq: Every day | ORAL | Status: DC
  Administered 2022-09-10 – 2022-09-11 (×2): 50 mg via ORAL
  Filled 2022-09-10 (×2): qty 2

## 2022-09-10 MED ORDER — LORAZEPAM 0.5 MG PO TABS
0.5000 mg | ORAL_TABLET | ORAL | Status: DC | PRN
  Administered 2022-09-10 – 2022-09-11 (×2): 0.5 mg via ORAL
  Filled 2022-09-10 (×3): qty 1
  Filled 2022-09-10: qty 2
  Filled 2022-09-10: qty 1

## 2022-09-10 NOTE — Progress Notes (Addendum)
PROGRESS NOTE    Kristi Jennings  ZOX:[id number] DOB: 08/22/1945 DOA: 08/30/2022 PCP: [name], NP   Brief Narrative: DENECE SHEARER is a 77 y.o. 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 Patient presented secondary to progressive weakness and fatigue with associated sodium of 123, presumed secondary to her oral chemotherapy and dehydration. Patient started on IV fluids and CT imaging concerning for new metastasis. MRI thoracic and lumbar spine with multiple areas of pathologic fractures in addition to spinal stenosis and soft tissue mass involving the left paravertebral soft tissues at T2-T4 and causing probable cord compression resulting in paraplegia. Emergent radiation therapy started. MRI brain identified multiple punctate infarcts; neurology consulted. Patient started emergent XRT on 4/12.  Patient remains medically stable for discharge, currently awaiting safe disposition to SNF -pending bed availability and insurance approval.  Outpatient follow-up with radiation oncology and hematology oncology as scheduled.  Assessment and Plan:  Goals of Care -Attempted conversation with patient about goals of care again today - she remains remarkably anxious and in denial of her current situation.  She has widely metastatic stage IV non-small cell lung cancer to bone right adrenal gland with previous discontinuation of palliative chemo due to progression of disease. Per Dr Asa Lente note in 2022 patient has known incurable disease and palliative care has been the focus of care since that timeframe.  Immunotherapy is ongoing with [facility] per Dr [name] but given new pathological compression fracture of the lumbar spine with cord compression and stenosis and subsequent paraplegia there  should be ongoing conversation about quality of life versus quantity of time alive given her now bedbound status and remarkable anxiety.  Patient remains remarkably anxious and tearful and adamant she "will get better" unfortunately her mental status continues to wax and wane as does her nutrition status is unclear whether or not she will be able to make decisions moving forward. -Husband was called multiple times and attempts to approach this conversation with no answer.  We appreciate palliative care's ongoing discussion with family but patient at this time remains full code with full scope of care despite obviously progressing disease with worsening metabolic and nutritional status as well as worsening mental status acutely over the past week. -Patient has been following with palliative care but continues to be Full code/full scope care despite ongoing discussion of her incurable and widely spread metastatic disease.  Severe spinal stenosis secondary to metastatic/pathologic fractures(POA, multiple) Bilateral lower extremity weakness/paralysis - Likely related to thoracic mass involving the spinal canal at T2-T4 with resultant severe spinal stenosis but unclear evidence of cord compression. History of severe cervical cord compression in 2022. CT head with concern for possible brain metastasis, which is ruled out on non-contrast MRI. MRI thoracic/lumbar spine with multiple pathologic compression fractures and severe spinal stenosis noted at L4-5. Weakness has now progressed to flaccid paralysis. Neurosurgery consulted but recommend no surgical management secondary to poor patient candidacy.  -Radiation oncology recommendations: XRT and high dose steroids (per 4/12 discussion). -Discussed with oncology, Dr. Shirline Frees -he spoke with patient and family, plan is to resume chemotherapy as previously scheduled once discharged from the hospital. -PT/OT recommending SNF - medically stable for discharge, awaiting  insurance approval and SNF bed availability  Profound and uncontrolled anxiety/depression -Patient histrionic today exclaiming "I don't want to go to hell" "I don't want to  die" and is truly inconsolable - increase lorazepam to 0.5-1mg  q4h - this remains somewhat difficult to dose given her mental status/anxiety and her full code status.  -I fear her anxiety is being under-treated due to ongoing concern for respiratory depression/mental status depression. -Continues on max dose lexapro; will add on low dose seroquel at night - consider BID dosing if tolerated well     Chest pain, chronic -Likely related to known metastatic bony lesions. Involvement of the manubrium.  -Continue acetaminophen, gabapentin, dilaudid, MS contin - no changes at this time  Chronic pulmonary embolism -Continue Eliquis  Acute punctate CVA, multiple Multiple punctate infarcts noted on MRI. Patient is on Eliquis for pulmonary embolism. No evidence of hemorrhage. LDL of 80. Hemoglobin A1C of 5.2%. Echo significant for no intracardiac source of embolism or interatrial shunt.   -Continue Eliquis  Metastatic non-small cell lung cancer Bone metastasis Right adrenal metastasis Patient follows with Dr. [name]. Recently on oral chemotherapy which was held secondary to hyponatremia and dehydration -plan to resume after discharge per discussion with Dr. [name].  Multiple vertebral compression fractures Noted on prior imaging. Likely pathologic fractures from metastasis.  Symptomatic hyponatremia, likely multifactorial Acute on chronic, improving with fluids patient also with metastatic cancer.  TSH normal.  Urine sodium inconsistent with dehydration; continue salt tabs, fluid restriction  Ascending aortic aneurysm Noted. Blood pressure mostly normotensive.  Repeat routine imaging 3 months per guidelines  Prediabetes Hemoglobin A1C is 5.2%  Primary hypertension -Continue home atenolol  Polymyalgia  rheumatica -Continue home gabapentin  Cancer related pain Chronic pain -Continue home MS Contin and dilaudid -Miralax and Senokot-S  Underweight Severe malnutrition Secondary to chronic illness. Estimated body mass index is 14.77 kg/m as calculated from the following:   Height as of this encounter:  (1.676 m).   Weight as of this encounter: 41.5 kg.  Pressure injury Mid coccyx. Present on admission.  DVT prophylaxis: Eliquis Code Status:   Code Status: Full Code Family Communication: Husband and friend at bedside Disposition Plan: Discharge to SNF pending  Consultants:  Neurosurgery Neurology Medical oncology Radiation oncology  Procedures:  4/13: Transthoracic Echocardiogram  Antimicrobials: None   Subjective: No issues noted overnight.  Patient continues to have profound anxiety in regards to her hospitalization  Objective: BP (!) 156/72 (BP Location: Right Arm)   Pulse (!) 53   Temp 97.7 F (36.5 C) (Oral)   Resp 16   Ht  (1.676 m)   Wt 41.5 kg   SpO2 99%   BMI 14.77 kg/m   Examination:  General:  Pleasantly resting in bed, No acute distress. HEENT:  Normocephalic atraumatic.  Sclerae nonicteric, noninjected.  Extraocular movements intact bilaterally. Neck:  Without mass or deformity.  Trachea is midline. Lungs:  Clear to auscultate bilaterally without rhonchi, wheeze, or rales. Heart:  Regular rate and rhythm.  Without murmurs, rubs, or gallops. Abdomen:  Soft, nontender, nondistended. Extremities: Bilateral lower extremity proximal weakness/distal paralysis with decreased sensation  Data Reviewed: I have personally reviewed following labs and imaging studies  CBC Lab Results  Component Value Date   WBC 17.0 (H) 09/09/2022   RBC 3.75 (L) 09/09/2022   HGB 10.9 (L) 09/09/2022   HCT 32.5 (L) 09/09/2022   MCV 86.7 09/09/2022   MCH 29.1 09/09/2022   PLT 250 09/09/2022   MCHC 33.5 09/09/2022   RDW 12.5 09/09/2022   LYMPHSABS 1.1  08/30/2022   MONOABS 1.4 (H) 08/30/2022   EOSABS 0.1 08/30/2022   BASOSABS 0.0 08/30/2022  Last metabolic panel Lab Results  Component Value Date   NA 125 (L) 09/09/2022   K 4.8 09/09/2022   CL 88 (L) 09/09/2022   CO2 28 09/09/2022   BUN 24 (H) 09/09/2022   CREATININE 0.59 09/09/2022   GLUCOSE 120 (H) 09/09/2022   GFRNONAA >60 09/09/2022   CALCIUM 7.8 (L) 09/09/2022   PROT 6.4 (L) 08/30/2022   ALBUMIN 3.3 (L) 08/30/2022   BILITOT 0.7 08/30/2022   ALKPHOS 56 08/30/2022   AST 31 08/30/2022   ALT 16 08/30/2022   ANIONGAP 9 09/09/2022    GFR: Estimated Creatinine Clearance: 39.8 mL/min (by C-G formula based on SCr of 0.59 mg/dL).  No results found for this or any previous visit (from the past 240 hour(s)).    Radiology Studies: No results found.    LOS: 11 days    Carma Leaven DO [facility] 09/10/2022, 7:40 AM   If 7PM-7AM, please contact night-coverage [email or web address]

## 2022-09-11 ENCOUNTER — Ambulatory Visit
Admit: 2022-09-11 | Discharge: 2022-09-11 | Disposition: A | Discharge status: Home / Self Care | Payer: Medicare HMO | Attending: Radiation Oncology | Admitting: Radiation Oncology

## 2022-09-11 ENCOUNTER — Other Ambulatory Visit: Payer: Self-pay

## 2022-09-11 DIAGNOSIS — E871 Hypo-osmolality and hyponatremia: Secondary | ICD-10-CM | POA: Diagnosis not present

## 2022-09-11 DIAGNOSIS — C3490 Malignant neoplasm of unspecified part of unspecified bronchus or lung: Secondary | ICD-10-CM | POA: Diagnosis not present

## 2022-09-11 DIAGNOSIS — Z515 Encounter for palliative care: Secondary | ICD-10-CM | POA: Diagnosis not present

## 2022-09-11 LAB — RAD ONC ARIA SESSION SUMMARY
Course Elapsed Days: 10
Plan Fractions Treated to Date: 6
Plan Prescribed Dose Per Fraction: 3 Gy
Plan Total Fractions Prescribed: 9
Plan Total Prescribed Dose: 27 Gy
Reference Point Dosage Given to Date: 18 Gy
Reference Point Session Dosage Given: 3 Gy
Session Number: 7

## 2022-09-11 LAB — CBC
HCT: 31.8 % — ABNORMAL LOW (ref 36.0–46.0)
Hemoglobin: 10.7 g/dL — ABNORMAL LOW (ref 12.0–15.0)
MCH: 29.2 pg (ref 26.0–34.0)
MCHC: 33.6 g/dL (ref 30.0–36.0)
MCV: 86.6 fL (ref 80.0–100.0)
Platelets: 162 10*3/uL (ref 150–400)
RBC: 3.67 MIL/uL — ABNORMAL LOW (ref 3.87–5.11)
RDW: 12.4 % (ref 11.5–15.5)
WBC: 15.9 10*3/uL — ABNORMAL HIGH (ref 4.0–10.5)
nRBC: 0 % (ref 0.0–0.2)

## 2022-09-11 LAB — BASIC METABOLIC PANEL
Anion gap: 8 (ref 5–15)
BUN: 26 mg/dL — ABNORMAL HIGH (ref 8–23)
CO2: 31 mmol/L (ref 22–32)
Calcium: 8 mg/dL — ABNORMAL LOW (ref 8.9–10.3)
Chloride: 87 mmol/L — ABNORMAL LOW (ref 98–111)
Creatinine, Ser: 0.54 mg/dL (ref 0.44–1.00)
GFR, Estimated: >60 mL/min (ref >60)
Glucose, Bld: 116 mg/dL — ABNORMAL HIGH (ref 70–99)
Potassium: 4.6 mmol/L (ref 3.5–5.1)
Sodium: 126 mmol/L — ABNORMAL LOW (ref 135–145)

## 2022-09-11 LAB — GLUCOSE, CAPILLARY
Glucose-Capillary: 112 mg/dL — ABNORMAL HIGH (ref 70–99)
Glucose-Capillary: 111 mg/dL — ABNORMAL HIGH (ref 70–99)

## 2022-09-11 MED ORDER — HYDROMORPHONE HCL 1 MG/ML IJ SOLN
0.5000 mg | INTRAMUSCULAR | Status: DC | PRN
  Administered 2022-09-12: 0.5 mg via INTRAVENOUS
  Administered 2022-09-12 (×2): 1 mg via INTRAVENOUS
  Filled 2022-09-11 (×3): qty 1

## 2022-09-11 MED ORDER — BIOTENE DRY MOUTH MT LIQD: 15.0000 mL | OROMUCOSAL | Status: DC | PRN

## 2022-09-11 MED ORDER — GLYCOPYRROLATE 0.2 MG/ML IJ SOLN: 0.2000 mg | INTRAMUSCULAR | Status: DC | PRN

## 2022-09-11 MED ORDER — LORAZEPAM 2 MG/ML PO CONC
0.5000 mg | ORAL | Status: DC | PRN
  Administered 2022-09-12: 1 mg via SUBLINGUAL
  Filled 2022-09-11: qty 1

## 2022-09-11 NOTE — Progress Notes (Signed)
PROGRESS NOTE    Kristi Jennings  ZOX:[id number] DOB: 1946-04-02 DOA: 08/30/2022 PCP: [name], NP   Brief Narrative: Kristi Jennings is a 77 y.o. 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 Patient presented secondary to progressive weakness and fatigue with associated sodium of 123, presumed secondary to her oral chemotherapy and dehydration. Patient started on IV fluids and CT imaging concerning for new metastasis. MRI thoracic and lumbar spine with multiple areas of pathologic fractures in addition to spinal stenosis and soft tissue mass involving the left paravertebral soft tissues at T2-T4 and causing probable cord compression resulting in paraplegia. Emergent radiation therapy started. MRI brain identified multiple punctate infarcts; neurology consulted. Patient started emergent XRT on 4/12.  Patient transitioning to comfort measures as of 09/11/2022, discontinue any further nonessential medications or therapy, no further radiation or chemo/immunotherapy with oncology.  Continue anxiolytics and analgesics as appropriate, advance diet as tolerated and otherwise will continue to focus on quality of life and comfort measures.  No further lab draws or other procedures planned.  Patient remains medically stable for discharge, awaiting further discussion between family palliative care and case management for placement, hospice at home is not amenable to family but discharged to facility with hospice is agreeable.  Assessment and Plan:  Goals of Care -See plan of care note with today's date, transitioning to comfort measures, DNR order signed, goldenrod on chart  Severe spinal stenosis secondary to metastatic/pathologic fractures(POA, multiple) Bilateral lower extremity  weakness/paralysis - Likely related to thoracic mass involving the spinal canal at T2-T4 with resultant severe spinal stenosis but unclear evidence of cord compression. History of severe cervical cord compression in 2022.  -No further intervention or treatment at this time, not medically stable for surgery, status post 1 week of radiation with no improvement in symptoms whatsoever.  Profound and uncontrolled anxiety/depression -Continue to titrate medications as appropriate -Continues on max dose lexapro; will add on low dose seroquel at night - consider BID dosing if tolerated well    Chest pain, chronic -Likely related to known metastatic bony lesions. Involvement of the manubrium.  -Continue acetaminophen, gabapentin, dilaudid, MS contin - no changes at this time  Chronic pulmonary embolism -Continue Eliquis  Acute punctate CVA, multiple Multiple punctate infarcts noted on MRI. Patient is on Eliquis for pulmonary embolism. No evidence of hemorrhage. LDL of 80. Hemoglobin A1C of 5.2%. Echo significant for no intracardiac source of embolism or interatrial shunt.   -Continue Eliquis  Metastatic non-small cell lung cancer Bone metastasis Right adrenal metastasis Dr. Shirline Frees updated, no further plans for treatment or ongoing therapy given transitioning to comfort measures and hospice as above  Multiple vertebral compression fractures Noted on prior imaging. Likely pathologic fractures from metastasis.  Symptomatic hyponatremia, likely multifactorial Acute on chronic, improving with fluids patient also with metastatic cancer.  TSH normal.  Urine sodium inconsistent with dehydration; continue salt tabs, fluid restriction  Ascending aortic aneurysm Noted.  Follow-up as appropriate  Prediabetes Hemoglobin A1C is 5.2%  Primary hypertension -Continue home atenolol  Polymyalgia rheumatica -Continue home gabapentin  Cancer related pain Chronic pain -Continue home MS Contin and  dilaudid -Miralax and Senokot-S  Underweight Severe malnutrition Secondary to chronic illness. Estimated body mass index is 15.69 kg/m as calculated from the following:   Height as of this encounter:  (1.676 m).  Weight as of this encounter: 44.1 kg.  Pressure injury Mid coccyx. Present on admission.  DVT prophylaxis: Eliquis Code Status:   Code Status: DNR -updated 09/11/2022 per husband and friend at bedside Family Communication: Husband and friend at bedside Disposition Plan: Discharge to SNF pending  Consultants:  Neurosurgery Neurology Medical oncology Radiation oncology  Procedures:  4/13: Transthoracic Echocardiogram  Antimicrobials: None   Subjective: No issues noted overnight.  Patient continues to have profound anxiety in regards to her hospitalization -somewhat somnolent this morning after recent anxiolytics and analgesics.  Review of systems somewhat limited  Objective: BP 122/70 (BP Location: Right Arm)   Pulse (!) 48   Temp 98.5 F (36.9 C) (Oral)   Resp 16   Ht  (1.676 m)   Wt 44.1 kg   SpO2 99%   BMI 15.69 kg/m   Examination:  General:  Pleasantly resting in bed, No acute distress. HEENT:  Normocephalic atraumatic.  Sclerae nonicteric, noninjected.  Extraocular movements intact bilaterally. Neck:  Without mass or deformity.  Trachea is midline. Lungs:  Clear to auscultate bilaterally without rhonchi, wheeze, or rales. Heart:  Regular rate and rhythm.  Without murmurs, rubs, or gallops. Abdomen:  Soft, nontender, nondistended. Extremities: Bilateral lower extremity paralysis with decreased sensation  Data Reviewed: I have personally reviewed following labs and imaging studies  CBC Lab Results  Component Value Date   WBC 15.9 (H) 09/11/2022   RBC 3.67 (L) 09/11/2022   HGB 10.7 (L) 09/11/2022   HCT 31.8 (L) 09/11/2022   MCV 86.6 09/11/2022   MCH 29.2 09/11/2022   PLT 162 09/11/2022   MCHC 33.6 09/11/2022   RDW 12.4 09/11/2022    LYMPHSABS 1.1 08/30/2022   MONOABS 1.4 (H) 08/30/2022   EOSABS 0.1 08/30/2022   BASOSABS 0.0 08/30/2022     Last metabolic panel Lab Results  Component Value Date   NA 126 (L) 09/11/2022   K 4.6 09/11/2022   CL 87 (L) 09/11/2022   CO2 31 09/11/2022   BUN 26 (H) 09/11/2022   CREATININE 0.54 09/11/2022   GLUCOSE 116 (H) 09/11/2022   GFRNONAA >60 09/11/2022   CALCIUM 8.0 (L) 09/11/2022   PROT 6.4 (L) 08/30/2022   ALBUMIN 3.3 (L) 08/30/2022   BILITOT 0.7 08/30/2022   ALKPHOS 56 08/30/2022   AST 31 08/30/2022   ALT 16 08/30/2022   ANIONGAP 8 09/11/2022    GFR: Estimated Creatinine Clearance: 42.3 mL/min (by C-G formula based on SCr of 0.54 mg/dL).  No results found for this or any previous visit (from the past 240 hour(s)).    Radiology Studies: No results found.    LOS: 12 days    Carma Leaven DO [facility] 09/11/2022, 3:36 PM   If 7PM-7AM, please contact night-coverage [email or web address]

## 2022-09-11 NOTE — TOC Progression Note (Addendum)
Transition of Care [facility]) - Progression Note    Patient Details  Name: Kristi Jennings MRN: [id number] Date of Birth: 03-05-1946  Transition of Care [facility]) CM/SW Contact  Leocadia Idleman, [name], RN Phone Number: 09/11/2022, 1:03 PM  Clinical Narrative:  left vm w/[name] for a call back. [facility] rep Alphonzo Lemmings -will start auth it can be for SNF w/palliative care; patient would have to private pay for hospice.I have left message w/[name] also-a referral was sent twice 09/02/22-they declined;& today. Continue to monitor   -2:39p-[name] rep [facility]-will have bed available for SNF w/palliative care services;Alphonzo Lemmings will discuss their rate w/the family/also will start auth if agreeable-await outcome.    Expected Discharge Plan: Skilled Nursing Facility Barriers to Discharge: Continued Medical Work up  Expected Discharge Plan and Services   Discharge Planning Services: CM Consult   Living arrangements for the past 2 months: Single Family Home                                       Social Determinants of Health (SDOH) Interventions SDOH Screenings   Food Insecurity: No Food Insecurity (08/31/2022)  Housing: Low Risk  (08/31/2022)  Transportation Needs: No Transportation Needs (08/31/2022)  Utilities: Not At Risk (08/31/2022)  Depression (PHQ2-9): Low Risk  (10/11/2021)  Tobacco Use: Medium Risk (09/01/2022)    Readmission Risk Interventions    01/06/2021    1:42 PM  Readmission Risk Prevention Plan  Transportation Screening Complete  PCP or Specialist Appt within 3-5 Days Complete  HRI or Home Care Consult Complete  Social Work Consult for Recovery Care Planning/Counseling Complete  Palliative Care Screening Complete  Medication Review Ocean[facility]) Complete

## 2022-09-11 NOTE — Progress Notes (Signed)
Daily Progress Note   Patient Name: Kristi Jennings       Date: 09/11/2022 DOB: 07-29-1945  Age: 77 y.o. MRN#: [id number] Attending Physician: Azucena Fallen, MD Primary Care Physician: [name], NP Admit Date: 08/30/2022  Reason for Consultation/Follow-up: Establishing goals of care, non-pain symptom management, and pain control  Patient Profile/HPI: Palliative Care consult requested for goals of care discussion in this 77 y.o. female  with past medical history of stage IV non-small cell lung cancer with bone and right adrenal metastasis s/p radiation, palliative chemo (discontinued due to progression), immunotherapy, currently on oral Krazati. She was admitted on 08/30/2022 from home with ongoing fatigue and lower extremity weakness s/p fall.  Recent imaging identified severe cervical cord compression, multiple pathological compression fractures, severe L4-5 spinal stenosis.  Weakness which is now progressed to paralysis.  Patient is not a surgical candidate per neurosurgery.  She is currently undergoing high-dose steroids and radiation therapy.    Subjective: Kristi Jennings is somnolent. Appears comfortable. Does not awaken to verbal stimuli. Recently received pain medication per RN. No family present at bedside. Called and spoke with husband via phone.   Physical Exam: Gen:  NAD, frail, ill-appearing  OZ:[id number]  PULM: diminished bilaterally ABD: soft, normal bowel sounds EXT: no edema, flaccid paralysis Neuro: somnolent    Vital Signs: BP 122/70 (BP Location: Right Arm)   Pulse (!) 48   Temp 98.5 F (36.9 C) (Oral)   Resp 16   Ht  (1.676 m)   Wt 44.1 kg   SpO2 99%   BMI 15.69 kg/m  SpO2: SpO2: 99 % O2 Device: O2 Device: Nasal Cannula O2 Flow Rate: O2 Flow Rate  (L/min): 2 L/min  Intake/output summary:  Intake/Output Summary (Last 24 hours) at 09/11/2022 1421 Last data filed at 09/11/2022 0830 Gross per 24 hour  Intake 235 ml  Output 1375 ml  Net -1140 ml    LBM: Last BM Date : 09/10/22 Baseline Weight: Weight: 42.6 kg Most recent weight: Weight: 44.1 kg   Patient Active Problem List   Diagnosis Date Noted   Hyponatremia 08/30/2022   Palliative care patient 05/12/2021   Muscle ache 04/13/2021   Metastatic bone tumor 01/20/2021   Pulmonary emboli 01/01/2021   Compression fracture of T12 vertebra 01/01/2021   Lumbar radiculopathy 01/01/2021  Non-small cell carcinoma of lung, stage 4, right 11/30/2020   Encounter for antineoplastic chemotherapy 11/30/2020   Encounter for antineoplastic immunotherapy 11/30/2020   Cancer associated pain 11/30/2020   Malnutrition, calorie 11/30/2020   Polymyalgia rheumatica 08/12/2019   Allergic rhinitis 06/10/2015   Anxiety 06/10/2015   Benign essential hypertension 06/10/2015    Palliative Care Assessment & Plan    Assessment Patient recently received pain medication.  Is resting comfortably.  I spoke with husband at length via phone.  He is emotional expressing hopes of making the right decision on behalf of his wife.  I assured Kristi Jennings given patient's recent decline in overall health/poor prognosis that he was making the best decision for her understanding that her quality of life is most important to her.  He verbalized understanding and appreciation.  He shares they have had recent conversations and patient acknowledged she would not want to live in her current condition if there was no chance of improvement.  Also acknowledged wishes for no life-sustaining measures.  Show she has not had much of an appetite over the past 3-4 days.  When she is awake only taking small bites and sips.  Kristi Jennings is emotional sharing disbelief of where things are.  States that he does not wish for his wife to suffer and  he does not like seeing her in pain.  Also speaks to her significant anxiety over the past 1-2 weeks recognizing that her health is declining at a point of no return.  Emotional support provided.  [name] had extensive discussions with [name] today and verbalized wishes to discontinue all aggressive treatments including radiation with a goal of focusing solely on Kristi Jennings's comfort.  He acknowledges similar request to myself during discussions.  He is asking appropriate questions regarding comfort focused care and end-of-life.  Education provided on comfort focused care while hospitalized as well as outpatient.  We discussed aggressive management of any symptoms allow patient to be in a comfortable state also with awareness that she may sleep more than she is awake.  He verbalized understanding again expressing not wanting her to suffer or to witness her in pain.  Kristi Jennings states over the past 24-48 hours he feels her pain is worsened and she is complaining of discomfort all over (from what she can feel).  He is appreciative of ongoing spiritual care visits although unfortunately patient has been sleeping or unavailable.  He expresses hope that at some point patient will be able to connect with the chaplain as this has always been very important to her.  Education provided on outpatient hospice support including the local hospice home.  Husband is clear in his expressed wishes to focus on her comfort and any involvement with hospice his preference would be with [facility] given they reside in the [facility] area as well as they have friends that are on staff there.  I acknowledged his request.  He is working with [name] regarding options.  Kristi Jennings states if at all possible he is not interested in the hospice home at this time and is hopeful that patient can be placed at local SNF facility with awareness she is no longer at the point of rehabilitation.  States he and a close friend have been in contact  with [name].  This information has been relayed to Kristi Messier, RN CM/SW.  Will defer further discussions regarding placement to TOC.  Kristi Jennings is aware we will focus on patient's comfort while hospitalized.  He is appreciative  of ongoing support and the care that she is receiving.  He is also aware that Dr. Shirline Frees has been notified by [name] regarding their decisions.  All questions answered and support provided.   Recommendations/Plan  DNR/DNI Ongoing goals of care discussions. Appreciate continued involvement of spiritual care for emotional and spiritual support. All care to focus solely on comfort in the setting of poor prognosis.  Husband is clear in his expressed wishes to aggressively manage symptoms.  Request to discontinue radiation and minimize medications.  Would like to continue certain medications for comfort and stability. Dilaudid PRN for pain/air hunger/comfort Robinul PRN for excessive secretions Ativan PRN for agitation/anxiety Zofran PRN for nausea Liquifilm tears PRN for dry eyes May have comfort feeding Comfort cart for family Unrestricted visitations in the setting of EOL (per policy) Oxygen PRN 2L or less for comfort. No escalation.   For pain: Gabapentin 300 mg at bedtime, MS Contin 30 mg every 12 hours, and Dilaudid 4 mg every 6 hours as needed for breakthrough pain. For constipation: Miralax twice daily and Senna 1 tab twice daily.  Prognosis:  Guarded-Poor   Discharge Planning: To be determined  Care plan was discussed with husband, bedside nursing staff, and medical team via secure chat including TOC.   Thank you for allowing the Palliative Medicine Team to assist in the care of this patient. Any controlled substances utilized were prescribed in the context of palliative care. PDMP has been reviewed.    Visit consisted of counseling and education dealing with the complex and emotionally intense issues of symptom management and palliative care in the  setting of serious and potentially life-threatening illness.Greater than 50%  of this time was spent counseling and coordinating care related to the above assessment and plan.  Willette Alma, AGPCNP-BC  Palliative Medicine Team/[facility]  *Please note that this is a verbal dictation therefore any spelling or grammatical errors are due to the "[employer]agon [employer] One" system interpretation.

## 2022-09-11 NOTE — Plan of Care (Signed)
  PROGRESS NOTE    WESTLYNN FIFER  NWG:[id number] DOB: [birth year] DOA: 08/30/2022 PCP: [name], NP   Brief Narrative:  Lengthy discussion today at bedside with patient's husband and friend, patient present for conversation but mental status continues to wax and wane in the setting of multiple comorbidities as well as polypharmacy while being treated for her breakthrough pain and anxiety in the setting of metastatic disease.  We discussed patient's prognosis and given her lack of improvement despite radiation and multiple rounds of therapy and treatment with oncology family has decided to focus on quality of life and comfort.  As such we will discontinue any further treatment, discontinue further procedures, lab draws or nonessential medications.  Family will follow-up with palliative care and hospice, ultimate plan likely is to discharge to facility with ongoing hospice care in the outpatient setting.  We did discuss hospice house as well as hospice at home.  Husband is not amenable to hospice at home but is agreeable now for hospice at facility and if necessary or reasonable okay to transfer to hospice house in the interim.  We discussed prognosis is quite poor however patient has been diagnosed with non-small cell lung cancer nearly 2 years ago with average life expensive 6 months we discussed that she had done quite well with treatment and to focus on remaining quality time and comfort while she continues to decline.  Time spent:  Azucena Fallen, DO [facility]  If 7PM-7AM, please contact night-coverage [email or web address]  09/11/2022, 3:28 PM

## 2022-09-11 NOTE — Progress Notes (Signed)
Chaplain was able to engage in an initial visit with Kristi Jennings. Kristi Jennings was resting. Chaplain let her know that she is there to support her. Kristi Jennings voiced that she didn't have any needs at this time. Chaplain will continue to check in on her.     09/11/22 1000  Spiritual Encounters  Type of Visit Follow up  Care provided to: Patient;Pt not available

## 2022-09-11 NOTE — Progress Notes (Signed)
PT Cancellation Note  Patient Details Name: Kristi Jennings MRN: [id number] DOB: 1945/11/13   Cancelled Treatment:     RN stated pt started Radiation today and is currently sleeping but "you can try".  Kristi Jennings only briefly opened her eyes to her name.  Unable to tolerate Physical Therapy today.  Will consult LPT regarding pt's unfortunate medical situation and poor prognosis.   Felecia Shelling  PTA Acute  Rehabilitation Services Office M-F          [phone]

## 2022-09-12 ENCOUNTER — Ambulatory Visit: Payer: Medicare HMO

## 2022-09-12 ENCOUNTER — Other Ambulatory Visit (HOSPITAL_COMMUNITY): Payer: Self-pay

## 2022-09-12 DIAGNOSIS — E871 Hypo-osmolality and hyponatremia: Secondary | ICD-10-CM | POA: Diagnosis not present

## 2022-09-12 DIAGNOSIS — Z515 Encounter for palliative care: Secondary | ICD-10-CM | POA: Diagnosis not present

## 2022-09-12 DIAGNOSIS — Z7189 Other specified counseling: Secondary | ICD-10-CM | POA: Diagnosis not present

## 2022-09-12 DIAGNOSIS — R531 Weakness: Secondary | ICD-10-CM | POA: Diagnosis not present

## 2022-09-12 MED ORDER — LORAZEPAM 2 MG/ML PO CONC: 0.5000 mg | ORAL | 0 refills | Status: DC | PRN

## 2022-09-12 MED ORDER — QUETIAPINE FUMARATE 50 MG PO TABS: 50.0000 mg | ORAL_TABLET | Freq: Every day | ORAL | 0 refills | Status: DC

## 2022-09-12 MED ORDER — NYSTATIN 100000 UNIT/ML MT SUSP: 5.0000 mL | Freq: Four times a day (QID) | OROMUCOSAL | 0 refills | Status: DC

## 2022-09-12 MED ORDER — HYDROMORPHONE HCL 1 MG/ML IJ SOLN: 0.5000 mg | INTRAMUSCULAR | 0 refills | Status: DC | PRN

## 2022-09-12 MED ORDER — NYSTATIN 100000 UNIT/ML MT SUSP
5.0000 mL | Freq: Four times a day (QID) | OROMUCOSAL | Status: DC
  Administered 2022-09-12 (×2): 500000 [IU] via ORAL
  Filled 2022-09-12 (×2): qty 5

## 2022-09-12 MED ORDER — HYDROMORPHONE HCL 4 MG PO TABS: 4.0000 mg | ORAL_TABLET | Freq: Four times a day (QID) | ORAL | 0 refills | Status: DC | PRN

## 2022-09-12 MED ORDER — MORPHINE SULFATE ER 30 MG PO TBCR
30.0000 mg | EXTENDED_RELEASE_TABLET | Freq: Two times a day (BID) | ORAL | 0 refills | Status: DC
Start: 2022-09-12 — End: 2022-09-22

## 2022-09-12 MED ORDER — BENZOCAINE 20 % MT AERO
INHALATION_SPRAY | Freq: Four times a day (QID) | OROMUCOSAL | Status: DC | PRN
  Administered 2022-09-12: 1 via OROMUCOSAL
  Filled 2022-09-12: qty 57

## 2022-09-12 MED ORDER — MENTHOL 3 MG MT LOZG
1.0000 | LOZENGE | OROMUCOSAL | Status: DC | PRN
  Administered 2022-09-12: 3 mg via ORAL
  Filled 2022-09-12: qty 9

## 2022-09-12 MED ORDER — ONDANSETRON HCL 4 MG/2ML IJ SOLN: 4.0000 mg | Freq: Four times a day (QID) | INTRAMUSCULAR | 0 refills | Status: DC | PRN

## 2022-09-12 NOTE — Progress Notes (Signed)
Pt discharged to hospice home in [facility]. Report received from receiving RN. Verbalized understanding. Pt left floor via stretcher accompanied by PTAR. Family at bedside updated on plan of care.

## 2022-09-12 NOTE — Progress Notes (Signed)
Chaplain offered a prayer over Kristi Jennings and held her hand. Ta said she wasn't in any pain besides her throat having some discomfort. Chaplain offered compassionate presence and support.   Chaplain Laraya Pestka, MDiv  09/12/22 1000  Spiritual Encounters  Type of Visit Follow up  Care provided to: Patient  Interventions  Spiritual Care Interventions Made Prayer

## 2022-09-12 NOTE — Plan of Care (Signed)
  Problem: Education: Goal: Knowledge of General Education information will improve Description: Including pain rating scale, medication(s)/side effects and non-pharmacologic comfort measures Outcome: Completed/Met   Problem: Health Behavior/Discharge Planning: Goal: Ability to manage health-related needs will improve Outcome: Progressing   Problem: Clinical Measurements: Goal: Ability to maintain clinical measurements within normal limits will improve Outcome: Adequate for Discharge Goal: Will remain free from infection Outcome: Adequate for Discharge Goal: Diagnostic test results will improve Outcome: Adequate for Discharge Goal: Respiratory complications will improve Outcome: Adequate for Discharge Goal: Cardiovascular complication will be avoided Outcome: Adequate for Discharge   Problem: Activity: Goal: Risk for activity intolerance will decrease Outcome: Adequate for Discharge   Problem: Nutrition: Goal: Adequate nutrition will be maintained Outcome: Adequate for Discharge   Problem: Coping: Goal: Level of anxiety will decrease Outcome: Adequate for Discharge   Problem: Elimination: Goal: Will not experience complications related to bowel motility Outcome: Adequate for Discharge Goal: Will not experience complications related to urinary retention Outcome: Adequate for Discharge   Problem: Pain Managment: Goal: General experience of comfort will improve Outcome: Adequate for Discharge   Problem: Safety: Goal: Ability to remain free from injury will improve Outcome: Adequate for Discharge   Problem: Skin Integrity: Goal: Risk for impaired skin integrity will decrease Outcome: Adequate for Discharge   Problem: Education: Goal: Knowledge of the prescribed therapeutic regimen will improve Outcome: Adequate for Discharge   Problem: Coping: Goal: Ability to identify and develop effective coping behavior will improve Outcome: Adequate for Discharge   Problem:  Clinical Measurements: Goal: Quality of life will improve Outcome: Adequate for Discharge   Problem: Respiratory: Goal: Verbalizations of increased ease of respirations will increase Outcome: Adequate for Discharge   Problem: Role Relationship: Goal: Family's ability to cope with current situation will improve Outcome: Adequate for Discharge Goal: Ability to verbalize concerns, feelings, and thoughts to partner or family member will improve Outcome: Adequate for Discharge   Problem: Pain Management: Goal: Satisfaction with pain management regimen will improve Outcome: Adequate for Discharge

## 2022-09-12 NOTE — Plan of Care (Signed)
Problem: Education: Goal: Knowledge of General Education information will improve Description: Including pain rating scale, medication(s)/side effects and non-pharmacologic comfort measures Outcome: Completed/Met   Problem: Health Behavior/Discharge Planning: Goal: Ability to manage health-related needs will improve 09/12/2022 1617 by Loel Ro, RN Outcome: Adequate for Discharge 09/12/2022 1058 by Loel Ro, RN Outcome: Progressing   Problem: Clinical Measurements: Goal: Ability to maintain clinical measurements within normal limits will improve 09/12/2022 1617 by Loel Ro, RN Outcome: Adequate for Discharge 09/12/2022 1058 by Loel Ro, RN Outcome: Adequate for Discharge Goal: Will remain free from infection 09/12/2022 1617 by Loel Ro, RN Outcome: Adequate for Discharge 09/12/2022 1058 by Loel Ro, RN Outcome: Adequate for Discharge Goal: Diagnostic test results will improve 09/12/2022 1617 by Loel Ro, RN Outcome: Adequate for Discharge 09/12/2022 1058 by Loel Ro, RN Outcome: Adequate for Discharge Goal: Respiratory complications will improve 09/12/2022 1617 by Loel Ro, RN Outcome: Adequate for Discharge 09/12/2022 1058 by Loel Ro, RN Outcome: Adequate for Discharge Goal: Cardiovascular complication will be avoided 09/12/2022 1617 by Loel Ro, RN Outcome: Adequate for Discharge 09/12/2022 1058 by Loel Ro, RN Outcome: Adequate for Discharge   Problem: Activity: Goal: Risk for activity intolerance will decrease 09/12/2022 1617 by Loel Ro, RN Outcome: Adequate for Discharge 09/12/2022 1058 by Loel Ro, RN Outcome: Adequate for Discharge   Problem: Nutrition: Goal: Adequate nutrition will be maintained 09/12/2022 1617 by Loel Ro, RN Outcome: Adequate for Discharge 09/12/2022 1058 by Loel Ro, RN Outcome: Adequate for Discharge   Problem: Coping: Goal: Level of  anxiety will decrease 09/12/2022 1617 by Loel Ro, RN Outcome: Adequate for Discharge 09/12/2022 1058 by Loel Ro, RN Outcome: Adequate for Discharge   Problem: Elimination: Goal: Will not experience complications related to bowel motility 09/12/2022 1617 by Loel Ro, RN Outcome: Adequate for Discharge 09/12/2022 1058 by Loel Ro, RN Outcome: Adequate for Discharge Goal: Will not experience complications related to urinary retention 09/12/2022 1617 by Loel Ro, RN Outcome: Adequate for Discharge 09/12/2022 1058 by Loel Ro, RN Outcome: Adequate for Discharge   Problem: Pain Managment: Goal: General experience of comfort will improve 09/12/2022 1617 by Loel Ro, RN Outcome: Adequate for Discharge 09/12/2022 1058 by Loel Ro, RN Outcome: Adequate for Discharge   Problem: Safety: Goal: Ability to remain free from injury will improve 09/12/2022 1617 by Loel Ro, RN Outcome: Adequate for Discharge 09/12/2022 1058 by Loel Ro, RN Outcome: Adequate for Discharge   Problem: Skin Integrity: Goal: Risk for impaired skin integrity will decrease 09/12/2022 1617 by Loel Ro, RN Outcome: Adequate for Discharge 09/12/2022 1058 by Loel Ro, RN Outcome: Adequate for Discharge   Problem: Education: Goal: Knowledge of the prescribed therapeutic regimen will improve 09/12/2022 1617 by Loel Ro, RN Outcome: Adequate for Discharge 09/12/2022 1058 by Loel Ro, RN Outcome: Adequate for Discharge   Problem: Coping: Goal: Ability to identify and develop effective coping behavior will improve 09/12/2022 1617 by Loel Ro, RN Outcome: Adequate for Discharge 09/12/2022 1058 by Loel Ro, RN Outcome: Adequate for Discharge   Problem: Clinical Measurements: Goal: Quality of life will improve 09/12/2022 1617 by Loel Ro, RN Outcome: Adequate for Discharge 09/12/2022 1058 by Loel Ro,  RN Outcome: Adequate for Discharge   Problem: Respiratory: Goal: Verbalizations of increased ease of respirations will increase 09/12/2022 1617 by Loel Ro, RN  Outcome: Adequate for Discharge 09/12/2022 1058 by Loel Ro, RN Outcome: Adequate for Discharge   Problem: Role Relationship: Goal: Family's ability to cope with current situation will improve 09/12/2022 1617 by Loel Ro, RN Outcome: Adequate for Discharge 09/12/2022 1058 by Loel Ro, RN Outcome: Adequate for Discharge Goal: Ability to verbalize concerns, feelings, and thoughts to partner or family member will improve 09/12/2022 1617 by Loel Ro, RN Outcome: Adequate for Discharge 09/12/2022 1058 by Loel Ro, RN Outcome: Adequate for Discharge   Problem: Pain Management: Goal: Satisfaction with pain management regimen will improve 09/12/2022 1617 by Loel Ro, RN Outcome: Adequate for Discharge 09/12/2022 1058 by Loel Ro, RN Outcome: Adequate for Discharge

## 2022-09-12 NOTE — Radiation Completion Notes (Signed)
Patient Name: Kristi Jennings, Kristi Jennings MRN: [id number] Date of Birth: December 06, 1945 Referring Physician: Hyman Hopes, M.D. Date of Service: 2022-09-12 Radiation Oncologist: Lonie Peak, M.D. [facility] [place]                             RADIATION ONCOLOGY END OF TREATMENT NOTE     Diagnosis: C79.51 Secondary malignant neoplasm of bone Staging on 2020-11-30: Non-small cell carcinoma of lung, stage 4, right T=cT3, N=cN2, M=cM1c Intent: Palliative     ==========DELIVERED PLANS==========  First Treatment Date: 2022-09-01 - Last Treatment Date: 2022-09-11   Plan Name: Spine_T1-T5 Site: Thoracic Spine Technique: 3D Mode: Photon Dose Per Fraction: 4 Gy Prescribed Dose (Delivered / Prescribed): 4 Gy / 4 Gy Prescribed Fxs (Delivered / Prescribed): 1 / 1   Plan Name: Spn_T1-T5RxCh Site: Thoracic Spine Technique: 3D Mode: Photon Dose Per Fraction: 3 Gy Prescribed Dose (Delivered / Prescribed): 18 Gy / 27 Gy Prescribed Fxs (Delivered / Prescribed): 6 / 9     ==========ON TREATMENT VISIT DATES========== 2022-09-04, 2022-09-11     ==========UPCOMING VISITS==========       ==========APPENDIX - ON TREATMENT VISIT NOTES==========   See weekly On Treatment Notes is Epic for details.

## 2022-09-12 NOTE — Progress Notes (Signed)
   This pt was referred to hospice care at [facility] in [facility]. Pt family toured this am and was introduced to hospice care and comfort care approach. They are in agreement with hospice philosophy and moving forward with hospice referral.  MD is in agreement with hospice referral and the pt was approved by our MD to come to the Hospice Home in [facility]. Offered bed to the family and they accepted the offer. MD in agreement with pt being d/c today and family aware. Norm Parcel RN [phone]

## 2022-09-12 NOTE — Progress Notes (Addendum)
Daily Progress Note   Patient Name: Kristi Jennings       Date: 09/12/2022 DOB: 1945-07-29  Age: 77 y.o. MRN#: [id number] Attending Physician: Azucena Fallen, MD Primary Care Physician: [name], NP Admit Date: 08/30/2022  Reason for Consultation/Follow-up: Establishing goals of care, non-pain symptom management, and pain control.  Patient Profile/HPI: Palliative Care consult requested for goals of care discussion in this 77 y.o. female  with past medical history of stage IV non-small cell lung cancer with bone and right adrenal metastasis s/p radiation, palliative chemo (discontinued due to progression), immunotherapy, currently on oral Krazati. She was admitted on 08/30/2022 from home with ongoing fatigue and lower extremity weakness s/p fall.  Recent imaging identified severe cervical cord compression, multiple pathological compression fractures, severe L4-5 spinal stenosis.  Weakness which is now progressed to paralysis.  Patient is not a surgical candidate per neurosurgery.  She recently underwent high-dose steroids and radiation therapy.   Despite treatment, patient has experienced continued decline. Decision was made on 09/11/2022 by husband to transition patient to discontinued radiation and transition patient to total comfort care.  Subjective: Presented to room and [name] observed alone, resting with eyes closed, and holding an oral sponge in her hand. On the over bed table, there is yogurt with a few bites eaten and untouched oatmeal and liquids.   [name] arouses easily with verbal and physical stimuli. She recognizes palliative care team, smiles, and thanks her "friends" for coming to visit. Patient in a drowsy/transitional state, drifting in and out of alertness.    [name] shares that she wants to "beat what is happening" and asks if she will be able to move her legs again. [name] NP approached conversation empathetically with patient that, because of her cancer, her legs will not move. Patient grimaces, becomes tearful, and anxious as she appears to be trying to process the information. Emotional support provided by team through therapeutic touch and distraction. [name] calms with an oral swab dipped in ice water.   She is noted to rest with reassurances that team will continue to make visits.  When leaving room, palliative team encounters Mr. Stannard arriving for visit. He shares with team that he just toured [facility] at [facility] and was pleased by the team and environment there. [name], NP  had long discussion with him regarding goals of care, ongoing comfort focused care, and expectations at hospice home. Mr. Wimbush is clear in his wishes for wife to be comfortable for what time she has left and not to suffer. Feels symptoms are well managed. He is much appreciative of her pain control. Speaks to her decline and making the best decision for her. [name] has confirmed wishes to proceed with the [facility] hosp[facility] home if approved. He has been working with [name], RN (TOC) and Dennard Nip, RN [facility] Liaison).   Continues to express appreciation of care provided and ongoing support.    Physical Exam Constitutional:      Appearance: She is cachectic. She is ill-appearing.  Pulmonary:     Effort: Pulmonary effort is normal.  Abdominal:     Comments: Sunken.  Skin:    General: Skin is warm and dry.     Coloration: Skin is pale.  Neurological:     Mental Status: She is lethargic.     Motor: Weakness present.     Comments: Drowsy, slowed speech.  Psychiatric:     Comments: Intermittent anxiety.             Vital Signs: BP (!) 115/52 (BP Location: Right Arm)   Pulse (!) 48   Temp 97.6 F (36.4 C) (Oral)   Resp 14   Ht 5\' 6"  (1.676  m)   Wt 44.1 kg   SpO2 100%   BMI 15.69 kg/m  SpO2: SpO2: 100 % O2 Device: O2 Device: Nasal Cannula O2 Flow Rate: O2 Flow Rate (L/min): 2 L/min  Intake/output summary:  Intake/Output Summary (Last 24 hours) at 09/12/2022 1150 Last data filed at 09/12/2022 0900 Gross per 24 hour  Intake 160 ml  Output 50 ml  Net 110 ml   LBM: Last BM Date : 09/11/22 Baseline Weight: Weight: 42.6 kg Most recent weight: Weight: 44.1 kg       Palliative Assessment/Data: 20%      Patient Active Problem List   Diagnosis Date Noted   Hyponatremia 08/30/2022   Palliative care patient 05/12/2021   Muscle ache 04/13/2021   Metastatic bone tumor 01/20/2021   Pulmonary emboli 01/01/2021   Compression fracture of T12 vertebra 01/01/2021   Lumbar radiculopathy 01/01/2021   Non-small cell carcinoma of lung, stage 4, right 11/30/2020   Encounter for antineoplastic chemotherapy 11/30/2020   Encounter for antineoplastic immunotherapy 11/30/2020   Cancer associated pain 11/30/2020   Malnutrition, calorie 11/30/2020   Polymyalgia rheumatica 08/12/2019   Allergic rhinitis 06/10/2015   Anxiety 06/10/2015   Benign essential hypertension 06/10/2015    Palliative Care Assessment & Plan    Assessment/Recommendations/Plan  DNR/DNI (completed on chart for discharge) Appreciate continued involvement of spiritual care for emotional and spiritual support. All care to focus solely on comfort in the setting of poor prognosis.  Husband is clear in his expressed wishes to aggressively manage symptoms.   Dilaudid PRN for pain/air hunger/comfort Robinul PRN for excessive secretions Ativan PRN for agitation/anxiety Zofran PRN for nausea Liquifilm tears PRN for dry eyes May have comfort feeding Comfort cart for family Unrestricted visitations in the setting of EOL (per policy) Oxygen PRN 2L or less for comfort. No escalation.   For pain: Gabapentin 300 mg at bedtime and MS Contin 30 mg every 12 hours  schedule. Dilaudid 4 mg PO every 6 hours as needed for breakthrough pain or Dilaudid 0.5-1 mg IV every 2 hours as needed for breakthrough pain (patient required  IV administration earlier this am). For constipation: Miralax twice daily and Senna 1 tab twice daily.   Code Status: DNR  Prognosis:  Days to weeks  Discharge Planning: Family hopeful for discharge to [facility] plan was discussed with husband, social work, medical team, and palliative team.  Thank you for allowing the Palliative Medicine Team to assist in the care of this patient.  Any controlled substances utilized were prescribed in the context of palliative care. PDMP has been reviewed.   I assessed patient with Marylene Land, NP Student. Agree with above findings.   Visit consisted of counseling and education dealing with the complex and emotionally intense issues of symptom management and palliative care in the setting of serious and potentially life-threatening illness.Greater than 50%  of this time was spent counseling and coordinating care related to the above assessment and plan.   Signed by: Katy Apo, RN MSN [facility] / NP Student  Willette Alma, AGPCNP-BC  Palliative Medicine Team/[facility]  *Please note that this is a verbal dictation therefore any spelling or grammatical errors are due to the "Dragon Medical One" system interpretation.

## 2022-09-12 NOTE — TOC Transition Note (Addendum)
Transition of Care [facility]) - CM/SW Discharge Note   Patient Details  Name: Kristi Jennings MRN: [id number] Date of Birth: November 10, 1945  [facility] CM/SW Contact:  Lanier Clam, RN Phone Number: 09/12/2022, 11:23 AM   Clinical Narrative:  Family has chosen [facility] following for acceptance await acceptance.  -12:24p-[facility]idential hosp[facility] [place] [facility] [place] has accepted-awaiting d/c summary,report tel# prior PTAR. -12:54p going to [facility],report [phone], PTAR called. No further CM needs.    Final next level of care: Hospice Medical Facility Barriers to Discharge: No Barriers Identified   Patient Goals and CMS Choice CMS Medicare.gov Compare Post Acute Care list provided to:: Patient Represenative (must comment) ([name](spouse)) Choice offered to / list presented to : Spouse  Discharge Placement                         Discharge Plan and Services Additional resources added to the After Visit Summary for     Discharge Planning Services: CM Consult                                 Social Determinants of Health (SDOH) Interventions SDOH Screenings   Food Insecurity: No Food Insecurity (08/31/2022)  Housing: Low Risk  (08/31/2022)  Transportation Needs: No Transportation Needs (08/31/2022)  Utilities: Not At Risk (08/31/2022)  Depression (PHQ2-9): Low Risk  (10/11/2021)  Tobacco Use: Medium Risk (09/01/2022)     Readmission Risk Interventions    09/11/2022    1:05 PM 01/06/2021    1:42 PM  Readmission Risk Prevention Plan  Transportation Screening Complete Complete  PCP or Specialist Appt within 3-5 Days  Complete  HRI or Home Care Consult  Complete  Social Work Consult for Recovery Care Planning/Counseling  Complete  Palliative Care Screening  Complete  Medication Review Oceanographer) Complete Complete  PCP or Specialist appointment within 3-5 days of discharge Complete   HRI or Home Care Consult  Complete   SW Recovery Care/Counseling Consult Complete   Palliative Care Screening Complete   Skilled Nursing Facility Complete

## 2022-09-12 NOTE — Discharge Summary (Signed)
Physician Discharge Summary  Kristi Jennings ZOX:[id number] DOB: 03/06/1946 DOA: 08/30/2022  PCP: [name], NP  Admit date: 08/30/2022 Discharge date: 09/12/2022  Admitted From: Home Disposition: Hospice  Discharge Condition: Poor/grim CODE STATUS: DNR Diet recommendation: As tolerated  Brief/Interim Summary: Kristi Jennings is a 77 y.o. 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 Patient presented secondary to progressive weakness and fatigue with associated sodium of 123, presumed secondary to her oral chemotherapy and dehydration. Patient started on IV fluids and CT imaging concerning for new metastasis. MRI thoracic and lumbar spine with multiple areas of pathologic fractures in addition to spinal stenosis and soft tissue mass involving the left paravertebral soft tissues at T2-T4 and causing probable cord compression resulting in paraplegia. Emergent radiation therapy started. MRI brain identified multiple punctate infarcts; neurology consulted. Patient started emergent XRT on 4/12.   Patient transitioned to comfort measures as of 09/11/2022, discontinue any further nonessential medications or therapy, no further radiation or chemo/immunotherapy with oncology.  Continue anxiolytics and analgesics as appropriate, advance diet as tolerated and otherwise will continue to focus on quality of life and comfort measures.  No further lab draws or other procedures planned.   Patient remains medically stable for discharge, awaiting insurance approval for placement, hospice at home is not amenable to family but discharge to facility with hospice vs hospice house is agreeable per discussion with family/friends.  Discharge Diagnoses:  Principal Problem:   Hyponatremia    Discharge  Instructions   Allergies as of 09/12/2022       Reactions   Shellfish-derived Products Hives, Itching, Rash   Augmentin [amoxicillin-pot Clavulanate] Nausea And Vomiting        Medication List     STOP taking these medications    atenolol 25 MG tablet Commonly known as: TENORMIN   diclofenac Sodium 1 % Gel Commonly known as: Voltaren   dronabinol 10 MG capsule Commonly known as: MARINOL   folic acid 1 MG tablet Commonly known as: FOLVITE   Krazati 200 MG tablet Generic drug: adagrasib   ondansetron 4 MG disintegrating tablet Commonly known as: ZOFRAN-ODT   predniSONE 5 MG tablet Commonly known as: DELTASONE   senna-docusate 8.6-50 MG tablet Commonly known as: Senokot-S       TAKE these medications    acetaminophen 650 MG CR tablet Commonly known as: TYLENOL Take 1,300 mg by mouth 3 (three) times daily as needed for pain.   albuterol 108 (90 Base) MCG/ACT inhaler Commonly known as: VENTOLIN HFA Inhale 1 puff into the lungs every 4 (four) hours as needed for wheezing or shortness of breath.   CALCIUM PO Take 1 tablet by mouth daily with breakfast.   cyclobenzaprine 5 MG tablet Commonly known as: FLEXERIL Take 5 mg by mouth 3 (three) times daily as needed for muscle spasms.   Eliquis 2.5 MG Tabs tablet Generic drug: apixaban Take 1 tablet (2.5 mg total) by mouth 2 (two) times daily.   Ensure High Protein Liqd Take 2-3 Cans by mouth daily.   escitalopram 20 MG tablet Commonly known as: LEXAPRO Take 1 tablet (20 mg total) by mouth at bedtime.   fexofenadine 180 MG tablet Commonly known as: ALLEGRA Take 180 mg by mouth daily.   fluocinonide 0.05 % external solution Commonly known as: LIDEX Apply 1 Application topically daily.   gabapentin 300 MG  capsule Commonly known as: NEURONTIN Take 1 capsule (300 mg total) by mouth at bedtime.   HYDROmorphone 1 MG/ML injection Commonly known as: DILAUDID Inject 0.5-1 mLs (0.5-1 mg total) into the  vein every 2 (two) hours as needed for severe pain or moderate pain (or dyspnea). What changed: You were already taking a medication with the same name, and this prescription was added. Make sure you understand how and when to take each.   HYDROmorphone 4 MG tablet Commonly known as: DILAUDID Take 1 tablet (4 mg total) by mouth every 6 (six) hours as needed for severe pain. What changed: Another medication with the same name was added. Make sure you understand how and when to take each.   lidocaine 5 % Commonly known as: LIDODERM Place 1 patch onto the skin daily. Remove & Discard patch within 12 hours or as directed by MD What changed:  when to take this reasons to take this additional instructions   LORazepam 2 MG/ML concentrated solution Commonly known as: ATIVAN Place 0.3-0.5 mLs (0.6-1 mg total) under the tongue every 4 (four) hours as needed for anxiety.   mirtazapine 15 MG tablet Commonly known as: REMERON Take 15 mg by mouth at bedtime.   morphine 30 MG 12 hr tablet Commonly known as: MS CONTIN Take 1 tablet (30 mg total) by mouth every 12 (twelve) hours.   Multi Vitamin Tabs Take 1 tablet by mouth daily with breakfast.   nystatin 100000 UNIT/ML suspension Commonly known as: MYCOSTATIN Take 5 mLs (500,000 Units total) by mouth 4 (four) times daily.   Ocean Nasal Spray 0.65 % nasal spray Generic drug: sodium chloride Place 1 spray into the nose as needed for congestion.   ondansetron 4 MG/2ML Soln injection Commonly known as: ZOFRAN Inject 2 mLs (4 mg total) into the vein every 6 (six) hours as needed for nausea.   prochlorperazine 10 MG tablet Commonly known as: COMPAZINE Take 1 tablet (10 mg total) by mouth every 6 (six) hours as needed.   QUEtiapine 50 MG tablet Commonly known as: SEROQUEL Take 1 tablet (50 mg total) by mouth at bedtime.   SCALPICIN EX Apply 1 application  topically daily as needed (for iritation or itching- scalp).   Vitamin D3 50 MCG  (2000 UT) Tabs Take 2,000 Units by mouth daily.        Contact information for after-discharge care     Destination     [facility] .   Service: Skilled Nursing Contact information: [address] [place] [address] [phone]                    Allergies  Allergen Reactions   Shellfish-Derived Products Hives, Itching and Rash   Augmentin [Amoxicillin-Pot Clavulanate] Nausea And Vomiting    Consultations: Oncology, palliative care  Procedures/Studies: ECHOCARDIOGRAM COMPLETE  Result Date: 09/02/2022    ECHOCARDIOGRAM REPORT   Patient Name:   DIERDRA SALAMEH [facility] Date of Exam: 09/02/2022 Medical Rec #:  [id number]     Height:       66.0 in Accession #:    [id number]    Weight:       97.9 lb Date of Birth:  [birth year]     BSA:          1.476 m Patient Age:    76 years      BP:           118/93 mmHg Patient Gender: F  HR:           53 bpm. Exam Location:  Inpatient Procedure: 2D Echo, Cardiac Doppler and Color Doppler Indications:    Stroke  History:        Patient has no prior history of Echocardiogram examinations.                 Stroke, Arrythmias:Bradycardia; Risk Factors:Hypertension.  Sonographer:    Lucy Antigua Referring Phys: [phone] RALPH A NETTEY IMPRESSIONS  1. Left ventricular ejection fraction, by estimation, is 65 to 70%. The left ventricle has normal function. The left ventricle has no regional wall motion abnormalities. Left ventricular diastolic parameters are indeterminate.  2. Right ventricular systolic function is normal. The right ventricular size is normal. There is normal pulmonary artery systolic pressure.  3. The mitral valve is grossly normal. Trivial mitral valve regurgitation.  4. The aortic valve is tricuspid. There is mild calcification of the aortic valve. There is mild thickening of the aortic valve. Aortic valve regurgitation is mild to moderate. Aortic valve sclerosis/calcification is present, without any evidence  of aortic stenosis.  5. The inferior vena cava is normal in size with greater than 50% respiratory variability, suggesting right atrial pressure of 3 mmHg. Conclusion(s)/Recommendation(s): No intracardiac source of embolism detected on this transthoracic study. Consider a transesophageal echocardiogram to exclude cardiac source of embolism if clinically indicated. FINDINGS  Left Ventricle: Left ventricular ejection fraction, by estimation, is 65 to 70%. The left ventricle has normal function. The left ventricle has no regional wall motion abnormalities. The left ventricular internal cavity size was normal in size. There is  no left ventricular hypertrophy. Left ventricular diastolic parameters are indeterminate. Right Ventricle: The right ventricular size is normal. No increase in right ventricular wall thickness. Right ventricular systolic function is normal. There is normal pulmonary artery systolic pressure. The tricuspid regurgitant velocity is 2.59 m/s, and  with an assumed right atrial pressure of 3 mmHg, the estimated right ventricular systolic pressure is 29.8 mmHg. Left Atrium: Left atrial size was normal in size. Right Atrium: Right atrial size was normal in size. Pericardium: There is no evidence of pericardial effusion. Mitral Valve: The mitral valve is grossly normal. There is mild thickening of the mitral valve leaflet(s). Trivial mitral valve regurgitation. Tricuspid Valve: The tricuspid valve is normal in structure. Tricuspid valve regurgitation is mild. Aortic Valve: The aortic valve is tricuspid. There is mild calcification of the aortic valve. There is mild thickening of the aortic valve. Aortic valve regurgitation is mild to moderate. Aortic valve sclerosis/calcification is present, without any evidence of aortic stenosis. Aortic valve mean gradient measures 2.0 mmHg. Aortic valve peak gradient measures 4.2 mmHg. Aortic valve area, by VTI measures 3.11 cm. Pulmonic Valve: The pulmonic valve was  normal in structure. Pulmonic valve regurgitation is trivial. Aorta: The aortic root is normal in size and structure. Venous: The inferior vena cava is normal in size with greater than 50% respiratory variability, suggesting right atrial pressure of 3 mmHg. IAS/Shunts: The atrial septum is grossly normal.  LEFT VENTRICLE PLAX 2D LVIDd:         3.80 cm     Diastology LVIDs:         2.40 cm     LV e' medial:    5.55 cm/s LV PW:         1.00 cm     LV E/e' medial:  12.4 LV IVS:        0.80 cm  LV e' lateral:   8.38 cm/s LVOT diam:     2.00 cm     LV E/e' lateral: 8.2 LV SV:         73 LV SV Index:   49 LVOT Area:     3.14 cm  LV Volumes (MOD) LV vol d, MOD A2C: 42.0 ml LV vol s, MOD A2C: 16.6 ml LV SV MOD A2C:     25.4 ml RIGHT VENTRICLE RV S prime:     11.60 cm/s TAPSE (M-mode): 1.8 cm LEFT ATRIUM           Index LA Vol (A2C): 38.0 ml 25.74 ml/m LA Vol (A4C): 21.3 ml 14.43 ml/m  AORTIC VALVE AV Area (Vmax):    2.57 cm AV Area (Vmean):   2.81 cm AV Area (VTI):     3.11 cm AV Vmax:           102.00 cm/s AV Vmean:          64.200 cm/s AV VTI:            0.233 m AV Peak Grad:      4.2 mmHg AV Mean Grad:      2.0 mmHg LVOT Vmax:         83.60 cm/s LVOT Vmean:        57.400 cm/s LVOT VTI:          0.231 m LVOT/AV VTI ratio: 0.99  AORTA Ao Root diam: 3.40 cm Ao Asc diam:  3.00 cm MITRAL VALVE               TRICUSPID VALVE MV Area (PHT): 3.31 cm    TR Peak grad:   26.8 mmHg MV Decel Time: 229 msec    TR Vmax:        259.00 cm/s MV E velocity: 68.60 cm/s MV A velocity: 63.40 cm/s  SHUNTS MV E/A ratio:  1.08        Systemic VTI:  0.23 m                            Systemic Diam: 2.00 cm Laurance Flatten MD Electronically signed by Laurance Flatten MD Signature Date/Time: 09/02/2022/12:04:18 PM    Final    MR CERVICAL SPINE WO CONTRAST  Result Date: 09/02/2022 CLINICAL DATA:  Initial evaluation for neck pain, known malignancy. EXAM: MRI CERVICAL SPINE WITHOUT CONTRAST TECHNIQUE: Multiplanar, multisequence MR  imaging of the cervical spine was performed. No intravenous contrast was administered. COMPARISON:  Comparison made with prior MRI of the thoracic spine from 08/31/2022 as well as previous MRI of the cervical spine from 01/03/2021. FINDINGS: Alignment: Examination moderately degraded by motion artifact, limiting assessment. Reversal of the normal cervical lordosis, apex at C5. Trace degenerative anterolisthesis of C3 on C4 and C4 on C5 as well as C7 on T1. 3 mm retrolisthesis of C5 on C6. Vertebrae: Question subtly decreased T1 signal intensity within the C5 and C6 vertebral bodies. While this finding could potentially reflect metastatic involvement, overall appearance is similar as compared to prior study from 2022, and favored to be degenerative in nature. Superimposed 6 mm T2/stir hyperintense lesion within the C5 vertebral body noted, likely present on prior, and favored to reflect an atypical hemangioma. No other convincing evidence for malignancy within the cervical spine. Metastatic involvement involving the upper thoracic spine and spinal canal at T2 extending inferiorly, better seen on prior MRI of the thoracic spine. Underlying bone marrow  signal intensity within normal limits. No acute or chronic fracture within the cervical spine. Cord: Normal signal morphology seen within the cervical spinal cord itself. No convincing cord signal changes on this motion degraded exam. Intracanalicular/epidural tumor noted at T2 extending inferiorly, better seen on prior thoracic spine exam. Posterior Fossa, vertebral arteries, paraspinal tissues: Visualized brain and posterior fossa within normal limits. Craniocervical junction normal. Paraspinous soft tissues demonstrate no acute finding. Grossly preserved flow voids seen within the vertebral arteries bilaterally. Disc levels: C2-C3: Minimal disc bulge with endplate spurring. Mild-to-moderate bilateral facet hypertrophy. No spinal stenosis. Moderate left C3 foraminal  narrowing. Right neural foramen remains patent. C3-C4: Diffuse disc bulge with bilateral uncovertebral hypertrophy, asymmetric to the right. Resultant broad posterior disc osteophyte complex flattens and partially effaces the ventral thecal sac. Superimposed mild to moderate facet hypertrophy. Resultant mild spinal stenosis. Severe right worse than left C4 foraminal narrowing. C4-C5: Right eccentric disc osteophyte complex. Broad posterior component flattens and partially effaces the ventral thecal sac. Superimposed bilateral facet degeneration. Mild spinal stenosis. Severe right worse than left C5 foraminal narrowing. C5-C6: Advanced degenerative intervertebral disc space narrowing with 3 mm retrolisthesis. Associated diffuse disc osteophyte complex with endplate and uncovertebral spurring. Broad posterior component flattens and effaces the ventral thecal sac. Secondary cord flattening without definite cord signal changes. Severe spinal stenosis at this level with the thecal sac measuring approximately 3 mm in AP diameter at its most narrow point (series 8, image 30). Severe bilateral C6 foraminal narrowing. C6-C7: Degenerative intervertebral disc space narrowing with diffuse disc osteophyte complex. Broad posterior component flattens and largely effaces the ventral thecal sac. Secondary cord flattening without definite cord signal changes. Severe spinal stenosis with the thecal sac measuring approximately 4-5 mm in AP diameter at its most narrow point. Severe right with moderate to severe left C7 foraminal narrowing. C7-T1: Negative interspace. Moderate facet hypertrophy. No spinal stenosis. Mild left greater than right C8 foraminal narrowing. IMPRESSION: 1. Motion degraded exam. 2. Subtle T1 hypointense/STIR signal abnormality involving the C5 and C6 vertebral bodies. While this could potentially reflect metastatic disease, overall appearance is similar as compared to prior MRI from 01/03/2021. Given stability,  this is therefore favored to be degenerative in nature, although attention at follow-up is recommended. No other visible evidence for metastatic disease within the cervical spine. 3. Metastatic involvement involving the upper thoracic spine and spinal canal at T2 extending inferiorly, better seen on prior MRI of the thoracic spine. 4. Advanced multilevel cervical spondylosis with resultant severe spinal stenosis at C5-6 and C6-7. Secondary cord flattening at these levels without visible cord signal changes. 5. Multifactorial degenerative changes with resultant multilevel foraminal narrowing as above. Notable findings include moderate left C3 foraminal stenosis, with severe bilateral C4 through C7 foraminal narrowing. Electronically Signed   By: Rise Mu M.D.   On: 09/02/2022 02:21   MR LUMBAR SPINE WO CONTRAST  Result Date: 08/31/2022 CLINICAL DATA:  Initial evaluation for metastatic disease. EXAM: MRI LUMBAR SPINE WITHOUT CONTRAST TECHNIQUE: Multiplanar, multisequence MR imaging of the lumbar spine was performed. No intravenous contrast was administered. COMPARISON:  Prior CT from earlier the same day. FINDINGS: Segmentation: Standard. Lowest well-formed disc space labeled the L5-S1 level. Alignment: Mild exaggeration of the normal lumbar lordosis. Grade 1 stepwise degenerative retrolisthesis of L1 on L2 through L4 on L5. Vertebrae: Compression deformity involving the T12 vertebral body with up to 70% height loss and trace 3 mm bony retropulsion. This is suspected to be pathologic in nature. No significant residual  marrow edema. Additional mild benign/mechanical appearing compression deformity noted at the superior endplate of L1. Otherwise, vertebral body height maintained. Bone marrow signal intensity otherwise within normal limits. No other evidence for metastatic disease within the lumbar spine. Conus medullaris and cauda equina: Conus extends to the L2 level. Conus and cauda equina appear  normal. No visible epidural involvement of tumor. Paraspinal and other soft tissues: Paraspinous soft tissues demonstrate no acute finding. No paraspinous soft tissue mass or collection. Disc levels: L1-2: Degenerative intervertebral disc space narrowing with diffuse disc bulge and disc desiccation. Mild facet hypertrophy. No more than mild spinal stenosis. Foramina remain patent. L2-3: Degenerative intervertebral disc space narrowing with diffuse disc bulge and disc desiccation. Mild facet hypertrophy. Resultant mild canal with bilateral lateral recess stenosis, worse on the right. Foramina remain patent. L3-4: Disc bulge with disc desiccation. Disc bulging asymmetric to the right. Mild to moderate facet hypertrophy. Resultant mild-to-moderate canal with bilateral subarticular stenosis. Moderate bilateral L3 foraminal narrowing. L4-5: Degenerative intervertebral disc space narrowing with diffuse disc bulge and reactive endplate spurring. Moderate facet and ligament flavum hypertrophy. Resultant moderate to severe canal with bilateral subarticular stenosis. Severe bilateral L4 foraminal narrowing. L5-S1: Mild disc bulge with disc desiccation. Moderate facet hypertrophy. Resultant mild narrowing of the lateral recesses bilaterally. Central canal remains patent. No significant foraminal stenosis. IMPRESSION: 1. Compression deformity involving the T12 vertebral body with up to 70% height loss and trace 3 mm bony retropulsion. This is suspected to be pathologic in nature. 2. No other evidence for metastatic disease within the lumbar spine. 3. Multilevel lumbar spondylosis with resultant diffuse spinal stenosis, moderate to severe in nature at L4-5. 4. Moderate bilateral L3 and severe bilateral L4 foraminal stenosis related to disc bulge, reactive endplate spurring, and facet hypertrophy. Electronically Signed   By: Rise Mu M.D.   On: 08/31/2022 19:49   MR THORACIC SPINE WO CONTRAST  Result Date:  08/31/2022 CLINICAL DATA:  Initial evaluation for metastatic disease. EXAM: MRI THORACIC SPINE WITHOUT CONTRAST TECHNIQUE: Multiplanar, multisequence MR imaging of the thoracic spine was performed. No intravenous contrast was administered. COMPARISON:  Prior CT from earlier the same day. FINDINGS: Alignment:  Examination degraded by motion artifact. Straightening of the normal thoracic kyphosis. No significant listhesis. Vertebrae: Abnormal signal abnormality involving the T2 and T3 vertebral bodies, consistent with osseous metastatic disease. Abnormal paravertebral soft tissue along the left aspect of the vertebral column at the level of T3, likely reflecting metastatic disease. Paravertebral component measures approximately 3.5 x 1.5 x 5.1 cm (series 28). Additionally, there is abnormal soft tissue density filling the spinal canal at the levels of T2 through T4 (series 27, image 10), consistent with epidural extension of tumor. The underlying native cord is somewhat difficult to visualize at this level on this motion degraded exam, with possible intramedullary involvement not excluded. The cord itself appears to be somewhat expanded. No overt cord signal changes seen on this limited exam. Overall, degree of intracanalicular involvement measures approximately 4.3 cm in craniocaudad dimension. There is moderate to severe spinal stenosis at this level. Tumor seen extending into and largely obliterating the T2-3 and T3-4 neural foramina bilaterally. Associated mild pathologic compression fractures at the superior endplates of T2 and T3. Additional compression deformity at the superior endplate of T4 noted, also potentially pathologic. Additional compression deformities involving the T8 and T12 vertebral bodies, favored to be pathologic in nature. Height loss is maximal at T12 measuring up to approximately 70% with no more than trace 2-3 mm  bony retropulsion. No significant stenosis at these levels. Elsewhere, bone  marrow signal intensity is within normal limits. Vertebral body height otherwise maintained. Cord: Intracanalicular tumor involvement at T2 through T4 as above. Again, no obvious cord signal changes on this limited exam. Otherwise normal signal and morphology. No other epidural involvement of malignancy identified. Paraspinal and other soft tissues: Paravertebral tumor at the levels of T3-4 as above. This is in close proximity to patient's known right upper lobe mass. Small layering bilateral pleural effusions. No other acute finding. Disc levels: Underlying mild for age multilevel degenerative spondylosis with mild noncompressive disc bulging at T7-8 and T8-9. No other significant spinal stenosis. Moderate bilateral foraminal narrowing noted at T8-9 and T9-10. No other significant stenosis. IMPRESSION: 1. 3.5 x 1.5 x 5.1 cm soft tissue involving the left paravertebral soft tissues at T2 through T4, concerning for metastatic disease. Abnormal signal intensity within the adjacent T2 and T3 vertebral bodies, consistent with osseous involvement. Associated mild pathologic compression fractures at these levels. Additional mild compression deformity at T4, also likely involved. 2. Associated intracanalicular extension of tumor at the levels of T2 through T4 with resultant moderate to severe spinal stenosis. The underlying native cord is difficult to visualize on this motion degraded exam, with possible intramedullary involvement not excluded. No overt cord signal changes seen on this limited exam. 3. Additional compression deformities involving the T8 and T12 vertebral bodies, also likely pathologic. 4. Known right upper lobe mass with small layering bilateral pleural effusions, better evaluated on prior chest CT. 5. Underlying mild for age thoracic spondylosis without other high-grade spinal stenosis. Moderate bilateral foraminal narrowing at T8-9 and T9-10. Electronically Signed   By: Rise Mu M.D.   On:  08/31/2022 19:41   MR BRAIN WO CONTRAST  Result Date: 08/31/2022 CLINICAL DATA:  Initial evaluation for metastatic disease. EXAM: MRI HEAD WITHOUT CONTRAST TECHNIQUE: Multiplanar, multiecho pulse sequences of the brain and surrounding structures were obtained without intravenous contrast. COMPARISON:  CT from 08/30/2022 as well as prior MRI from 06/28/2022. FINDINGS: Brain: Moderate to advance cerebral atrophy. Moderately advanced chronic microvascular ischemic disease, similar to prior. Few scattered foci of punctate restricted diffusion are seen involving the bilateral cerebral hemispheres, consistent with small acute ischemic infarcts (series 5, images 41, 40, 38, 32, 29). No associated hemorrhage or mass effect. No other evidence for acute or subacute ischemia. Gray-white matter distinction otherwise maintained. No areas of chronic cortical infarction. No acute or chronic intracranial blood products. No mass lesion or evidence for intracranial metastatic disease on this noncontrast examination. No mass effect or midline shift. No hydrocephalus or extra-axial fluid collection. Pituitary gland and suprasellar region within normal limits. Vascular: Irregular flow void within the hypoplastic right vertebral artery, which could be related to slow flow and/or occlusion. Major intracranial vascular flow voids are otherwise maintained. Skull and upper cervical spine: Craniocervical junction within normal limits. Bone marrow signal intensity normal. No focal marrow replacing lesion. No scalp soft tissue abnormality. Sinuses/Orbits: Globes and orbital soft tissues within normal limits. Scattered mucosal thickening present about the sphenoethmoidal and maxillary sinuses, most pronounced within the right sphenoid sinus. No mastoid effusion. Other: None. IMPRESSION: 1. Few scattered punctate acute ischemic infarcts involving the bilateral cerebral hemispheres. No associated hemorrhage or mass effect. A central  thromboembolic etiology is suspected. 2. No evidence for intracranial metastatic disease on this noncontrast examination. 3. Irregular flow void within the hypoplastic right vertebral artery, which could be related to slow flow and/or occlusion. 4. Underlying moderate  to advanced cerebral atrophy with chronic microvascular ischemic disease. Electronically Signed   By: Rise Mu M.D.   On: 08/31/2022 19:25   CT CHEST W CONTRAST  Result Date: 08/31/2022 CLINICAL DATA:  Rib fracture suspected.  Non-small cell lung cancer. EXAM: CT CHEST WITH CONTRAST TECHNIQUE: Multidetector CT imaging of the chest was performed during intravenous contrast administration. RADIATION DOSE REDUCTION: This exam was performed according to the departmental dose-optimization program which includes automated exposure control, adjustment of the mA and/or kV according to patient size and/or use of iterative reconstruction technique. CONTRAST:  75mL OMNIPAQUE IOHEXOL 300 MG/ML  SOLN COMPARISON:  Chest x-ray same day.  CT chest 07/04/2022. FINDINGS: Cardiovascular: The ascending aorta is dilated measuring 4.0 x 4.3 cm, unchanged. There is no evidence for dissection. There are atherosclerotic calcifications of the aorta. The heart is normal in size. There is no pericardial effusion. There is no large central pulmonary embolism. Mediastinum/Nodes: No enlarged mediastinal, hilar, or axillary lymph nodes. Thyroid gland, trachea, and esophagus demonstrate no significant findings. Lungs/Pleura: There are new small bilateral pleural effusions. Right upper lobe/apical cavitary mass appears unchanged from the prior examination extending to the hilum. Right upper lobe bronchus remains patent. Scarring in the right middle lobe is stable. There is no new focal lung infiltrate or pneumothorax. Upper Abdomen: No acute abnormality. Musculoskeletal: Osseous metastatic disease again seen most significant at T3, T8 and T12. T4, T8 and T12 compression  deformities are stable. There is increasing anterior and left paramediastinal low-attenuation density at the L3-L4 level. This measures 3.6 x 1.8 x 3.4 cm on axial image 5/30 and sagittal image 7/73 and has increased compared to prior study. Right anterior L4 rib metastatic lesion is unchanged. IMPRESSION: 1. New small bilateral pleural effusions. 2. Stable right upper lobe/apical cavitary mass extending to the hilum. 3. Stable osseous metastatic disease. 4. Increasing anterior left paramediastinal low-attenuation density at the L3-L4 level. Findings may represent enlarging metastatic lesion or abscess. Consider further evaluation with MRI. Aortic Atherosclerosis (ICD10-I70.0). Electronically Signed   By: Darliss Cheney M.D.   On: 08/31/2022 18:44   CT HEAD WO CONTRAST ( )  Result Date: 08/30/2022 CLINICAL DATA:  Sudden numbness and tingling in the legs and feet. 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 Metastatic lung cancer. EXAM: CT HEAD WITHOUT CONTRAST TECHNIQUE: Contiguous axial images were obtained from the base of the skull through the vertex without intravenous contrast. RADIATION DOSE REDUCTION: This exam was performed according to the departmental dose-optimization program which includes automated exposure control, adjustment of the mA and/or kV according to patient size and/or use of iterative reconstruction technique. COMPARISON:  MRI 06/28/2022 FINDINGS: Brain: No focal abnormality is seen affecting the brainstem or cerebellum. Cerebral hemispheres show atrophy and chronic small-vessel ischemic change throughout the white matter. No sign of acute infarction. Axial image 20 suggests a possible 3-4 mm slightly hyperdense focus in the subcortical white matter of the right frontal lobe. Nothing was seen in this location on the previous MRI. This could be interval development of a metastasis. Consider repeat MRI. No evidence of hemorrhage, hydrocephalus or extra-axial collection. Vascular: There is  atherosclerotic calcification of the major vessels at the base of the brain. 5 mm aneurysm shown by MRI cannot be specifically seen by noncontrast CT. Skull: Normal Sinuses/Orbits: Clear/normal Other: None IMPRESSION: 1. Atrophy and chronic small-vessel ischemic change of the white matter. No sign of acute infarction. 2. Axial image 20 suggests a possible 3-4 mm slightly hyperdense focus in the subcortical white matter of the  right frontal lobe. Nothing was seen in this location on the previous MRI. This could be interval development of a small metastasis. Consider repeat MRI. 3. 5 mm aneurysm shown by MRI cannot be specifically seen by noncontrast CT. Electronically Signed   By: Paulina Fusi M.D.   On: 08/30/2022 16:04   DG Lumbar Spine Complete  Result Date: 08/30/2022 CLINICAL DATA:  Bilateral lower extremity weakness several days. Increasing weakness/dehydration recently. Stage IV lung cancer. EXAM: LUMBAR SPINE - COMPLETE 4+ VIEW COMPARISON:  CT 07/04/2022 FINDINGS: Vertebral body alignment is normal. Evidence of patient's known severe compression fractures of T8 and T12 thought to be pathologic due to underlying metastatic disease from known lung cancer. No new compression fractures. Moderate spondylosis of the lumbar spine to include moderate facet arthropathy over the lower lumbar spine. Moderate multilevel disc disease over the lumbar spine most prominent at the L2-3 and L4-5 levels. Mild degenerative change of the hips. Remainder of the exam is unchanged. IMPRESSION: 1. No acute findings. 2. Moderate spondylosis of the lumbar spine with moderate multilevel disc disease. 3. Known severe compression fractures of T8 and T12 thought to be pathologic due to known underlying metastatic lung cancer Electronically Signed   By: Elberta Fortis M.D.   On: 08/30/2022 14:51   DG Chest 2 View  Result Date: 08/30/2022 CLINICAL DATA:  Weakness EXAM: CHEST - 2 VIEW COMPARISON:  CXR 01/05/21 FINDINGS: No pleural  effusion. No pneumothorax. Increased asymmetric elevation of the right hemidiaphragm compared to 01/05/2021 likely secondary to interval volume loss. Unchanged appearance of the right lung apex compared to recent CT chest dated 07/04/2022. Normal cardiac and mediastinal contours. No radiographically apparent displaced rib fractures. Visualized upper abdomen is unremarkable. Vertebral body heights are unchanged compared to recent prior CT chest abdomen and pelvis dated 07/04/2022 with compression deformities in the mid and lower thoracic spine IMPRESSION: Persistent mass like thickening at the right lung apex is unchanged compared to 07/04/22 CT chest/abdomen/pelvis. No new focal airspace opacity. Electronically Signed   By: Lorenza Cambridge M.D.   On: 08/30/2022 14:37     Subjective: No acute issues or events overnight   Discharge Exam: Vitals:   09/11/22 1158 09/12/22 0622  BP: 122/70 (!) 115/52  Pulse: (!) 48 (!) 48  Resp: 16 14  Temp: 98.5 F (36.9 C) 97.6 F (36.4 C)  SpO2: 99% 100%   Vitals:   09/11/22 0558 09/11/22 0600 09/11/22 1158 09/12/22 0622  BP: 132/65  122/70 (!) 115/52  Pulse: (!) 55  (!) 48 (!) 48  Resp: Temp: 98.7 F (37.1 C)  98.5 F (36.9 C) 97.6 F (36.4 C)  TempSrc: Oral  Oral Oral  SpO2: 100%  99% 100%  Weight:  44.1 kg    Height:        General: Pt is alert but easily arousable, cachectic/thin/frail in appearance Cardiovascular: RRR, S1/S2 +, no rubs, no gallops Respiratory: CTA bilaterally, no wheezing, no rhonchi Abdominal: Soft, NT, ND, bowel sounds + Extremities: no edema, no cyanosis    The results of significant diagnostics from this hospitalization (including imaging, microbiology, ancillary and laboratory) are listed below for reference.     Microbiology: No results found for this or any previous visit (from the past 240 hour(s)).   Labs: BNP (last 3 results) No results for input(s): "BNP" in the last 8760 hours. Basic Metabolic  Panel: Recent Labs  Lab 09/06/22 0455 09/07/22 0531 09/09/22 0621 09/11/22 0540  NA 127*  128* 125* 126*  K 4.7 4.7 4.8 4.6  CL 92* 91* 88* 87*  CO2 28 29 28 31   GLUCOSE 127* 129* 120* 116*  BUN 27* 27* 24* 26*  CREATININE 0.69 0.62 0.59 0.54  CALCIUM 8.0* 7.8* 7.8* 8.0*   Liver Function Tests: No results for input(s): "AST", "ALT", "ALKPHOS", "BILITOT", "PROT", "ALBUMIN" in the last 168 hours. No results for input(s): "LIPASE", "AMYLASE" in the last 168 hours. No results for input(s): "AMMONIA" in the last 168 hours. CBC: Recent Labs  Lab 09/06/22 0455 09/07/22 0531 09/09/22 0621 09/11/22 0540  WBC 13.9* 15.1* 17.0* 15.9*  HGB 10.3* 10.2* 10.9* 10.7*  HCT 30.8* 30.9* 32.5* 31.8*  MCV 88.0 89.6 86.7 86.6  PLT 184 174 250 162   Cardiac Enzymes: No results for input(s): "CKTOTAL", "CKMB", "CKMBINDEX", "TROPONINI" in the last 168 hours. BNP: Invalid input(s): "POCBNP" CBG: Recent Labs  Lab 09/10/22 1121 09/10/22 1629 09/10/22 2143 09/11/22 0728 09/11/22 1155  GLUCAP 114* 137* 130* 112* 111*   D-Dimer No results for input(s): "DDIMER" in the last 72 hours. Hgb A1c No results for input(s): "HGBA1C" in the last 72 hours. Lipid Profile No results for input(s): "CHOL", "HDL", "LDLCALC", "TRIG", "CHOLHDL", "LDLDIRECT" in the last 72 hours. Thyroid function studies No results for input(s): "TSH", "T4TOTAL", "T3FREE", "THYROIDAB" in the last 72 hours.  Invalid input(s): "FREET3" Anemia work up No results for input(s): "VITAMINB12", "FOLATE", "FERRITIN", "TIBC", "IRON", "RETICCTPCT" in the last 72 hours. Urinalysis    Component Value Date/Time   COLORURINE YELLOW 08/31/2022 0140   APPEARANCEUR CLEAR 08/31/2022 0140   LABSPEC 1.010 08/31/2022 0140   PHURINE 7.0 08/31/2022 0140   GLUCOSEU NEGATIVE 08/31/2022 0140   HGBUR NEGATIVE 08/31/2022 0140   BILIRUBINUR SMALL (A) 08/31/2022 0140   KETONESUR NEGATIVE 08/31/2022 0140   PROTEINUR NEGATIVE 08/31/2022 0140    NITRITE NEGATIVE 08/31/2022 0140   LEUKOCYTESUR NEGATIVE 08/31/2022 0140   Sepsis Labs Recent Labs  Lab 09/06/22 0455 09/07/22 0531 09/09/22 0621 09/11/22 0540  WBC 13.9* 15.1* 17.0* 15.9*   Microbiology No results found for this or any previous visit (from the past 240 hour(s)).   Time coordinating discharge: Over 30 minutes  SIGNED:   Azucena Fallen, DO [facility] 09/12/2022, 12:34 PM Pager   If 7PM-7AM, please contact night-coverage [email or web address]

## 2022-09-13 ENCOUNTER — Ambulatory Visit: Payer: Medicare HMO

## 2022-09-13 DIAGNOSIS — C3491 Malignant neoplasm of unspecified part of right bronchus or lung: Secondary | ICD-10-CM | POA: Diagnosis not present

## 2022-09-14 ENCOUNTER — Ambulatory Visit: Payer: Medicare HMO

## 2022-09-19 ENCOUNTER — Other Ambulatory Visit: Payer: Self-pay

## 2022-09-19 ENCOUNTER — Other Ambulatory Visit (HOSPITAL_COMMUNITY): Payer: Self-pay

## 2022-09-26 ENCOUNTER — Telehealth: Payer: Self-pay | Admitting: Medical Oncology

## 2022-09-26 NOTE — Telephone Encounter (Signed)
Spoke to husband and expressed our sympathy in death of Silveria.

## 2022-10-05 ENCOUNTER — Ambulatory Visit: Payer: Medicare HMO | Admitting: Radiation Oncology

## 2022-10-21 DEATH — deceased

## 2022-12-07 ENCOUNTER — Other Ambulatory Visit: Payer: Self-pay
